# Patient Record
Sex: Female | Born: 1950 | Race: White | Hispanic: No | Marital: Married | Smoking: Never smoker
Health system: Southern US, Community
[De-identification: ages and names within clinical notes are randomized; demographics above are authoritative.]

## PROBLEM LIST (undated history)

## (undated) DIAGNOSIS — E782 Mixed hyperlipidemia: Secondary | ICD-10-CM

## (undated) DIAGNOSIS — M25559 Pain in unspecified hip: Secondary | ICD-10-CM

## (undated) DIAGNOSIS — K589 Irritable bowel syndrome without diarrhea: Secondary | ICD-10-CM

## (undated) DIAGNOSIS — H269 Unspecified cataract: Secondary | ICD-10-CM

## (undated) DIAGNOSIS — J300 Vasomotor rhinitis: Secondary | ICD-10-CM

## (undated) DIAGNOSIS — R011 Cardiac murmur, unspecified: Secondary | ICD-10-CM

## (undated) DIAGNOSIS — Z8619 Personal history of other infectious and parasitic diseases: Secondary | ICD-10-CM

## (undated) DIAGNOSIS — M7580 Other shoulder lesions, unspecified shoulder: Secondary | ICD-10-CM

## (undated) DIAGNOSIS — F419 Anxiety disorder, unspecified: Secondary | ICD-10-CM

## (undated) DIAGNOSIS — R030 Elevated blood-pressure reading, without diagnosis of hypertension: Secondary | ICD-10-CM

## (undated) DIAGNOSIS — Z860100 Personal history of colon polyps, unspecified: Secondary | ICD-10-CM

## (undated) DIAGNOSIS — N390 Urinary tract infection, site not specified: Secondary | ICD-10-CM

## (undated) DIAGNOSIS — T7840XA Allergy, unspecified, initial encounter: Secondary | ICD-10-CM

## (undated) DIAGNOSIS — S0300XA Dislocation of jaw, unspecified side, initial encounter: Secondary | ICD-10-CM

## (undated) DIAGNOSIS — F329 Major depressive disorder, single episode, unspecified: Secondary | ICD-10-CM

## (undated) DIAGNOSIS — N3090 Cystitis, unspecified without hematuria: Secondary | ICD-10-CM

## (undated) DIAGNOSIS — K449 Diaphragmatic hernia without obstruction or gangrene: Secondary | ICD-10-CM

## (undated) DIAGNOSIS — G4730 Sleep apnea, unspecified: Secondary | ICD-10-CM

## (undated) DIAGNOSIS — Z9289 Personal history of other medical treatment: Secondary | ICD-10-CM

## (undated) DIAGNOSIS — M797 Fibromyalgia: Secondary | ICD-10-CM

## (undated) DIAGNOSIS — H04123 Dry eye syndrome of bilateral lacrimal glands: Secondary | ICD-10-CM

## (undated) DIAGNOSIS — M5481 Occipital neuralgia: Secondary | ICD-10-CM

## (undated) DIAGNOSIS — E041 Nontoxic single thyroid nodule: Secondary | ICD-10-CM

## (undated) DIAGNOSIS — F32A Depression, unspecified: Secondary | ICD-10-CM

## (undated) DIAGNOSIS — Z87442 Personal history of urinary calculi: Secondary | ICD-10-CM

## (undated) DIAGNOSIS — H43399 Other vitreous opacities, unspecified eye: Secondary | ICD-10-CM

## (undated) HISTORY — PX: LEFT OOPHORECTOMY: SHX1961

## (undated) HISTORY — DX: Cardiac murmur, unspecified: R01.1

## (undated) HISTORY — DX: Unspecified cataract: H26.9

## (undated) HISTORY — DX: Occipital neuralgia: M54.81

## (undated) HISTORY — DX: Dislocation of jaw, unspecified side, initial encounter: S03.00XA

## (undated) HISTORY — DX: Irritable bowel syndrome, unspecified: K58.9

## (undated) HISTORY — DX: Personal history of other medical treatment: Z92.89

## (undated) HISTORY — PX: OTHER SURGICAL HISTORY: SHX169

## (undated) HISTORY — PX: LAPAROSCOPY: SHX197

## (undated) HISTORY — DX: Allergy, unspecified, initial encounter: T78.40XA

## (undated) HISTORY — DX: Major depressive disorder, single episode, unspecified: F32.9

## (undated) HISTORY — DX: Mixed hyperlipidemia: E78.2

## (undated) HISTORY — DX: Personal history of colon polyps, unspecified: Z86.0100

## (undated) HISTORY — DX: Cystitis, unspecified without hematuria: N30.90

## (undated) HISTORY — DX: Anxiety disorder, unspecified: F41.9

## (undated) HISTORY — PX: POLYPECTOMY: SHX149

## (undated) HISTORY — DX: Personal history of colonic polyps: Z86.010

## (undated) HISTORY — DX: Depression, unspecified: F32.A

## (undated) HISTORY — DX: Elevated blood-pressure reading, without diagnosis of hypertension: R03.0

## (undated) HISTORY — DX: Fibromyalgia: M79.7

## (undated) HISTORY — DX: Vasomotor rhinitis: J30.0

## (undated) HISTORY — DX: Personal history of other infectious and parasitic diseases: Z86.19

## (undated) HISTORY — DX: Dry eye syndrome of bilateral lacrimal glands: H04.123

## (undated) HISTORY — PX: UPPER GASTROINTESTINAL ENDOSCOPY: SHX188

## (undated) HISTORY — PX: UPPER GI ENDOSCOPY: SHX6162

---
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
## 2009-12-26 HISTORY — PX: COLONOSCOPY: SHX174

## 2014-12-26 HISTORY — PX: COLONOSCOPY: SHX174

## 2015-02-23 ENCOUNTER — Other Ambulatory Visit: Payer: Self-pay | Admitting: Physician Assistant

## 2015-02-23 DIAGNOSIS — M8580 Other specified disorders of bone density and structure, unspecified site: Secondary | ICD-10-CM

## 2015-02-23 DIAGNOSIS — Z1231 Encounter for screening mammogram for malignant neoplasm of breast: Secondary | ICD-10-CM

## 2015-03-05 ENCOUNTER — Other Ambulatory Visit: Payer: Self-pay | Admitting: Physician Assistant

## 2015-03-05 DIAGNOSIS — Z1231 Encounter for screening mammogram for malignant neoplasm of breast: Secondary | ICD-10-CM

## 2015-03-05 DIAGNOSIS — Z803 Family history of malignant neoplasm of breast: Secondary | ICD-10-CM

## 2015-04-06 ENCOUNTER — Ambulatory Visit
Admission: RE | Admit: 2015-04-06 | Discharge: 2015-04-06 | Disposition: A | Discharge status: Home / Self Care | Payer: No Typology Code available for payment source | Source: Ambulatory Visit | Attending: Physician Assistant | Admitting: Physician Assistant

## 2015-04-06 DIAGNOSIS — Z1231 Encounter for screening mammogram for malignant neoplasm of breast: Secondary | ICD-10-CM

## 2015-04-06 DIAGNOSIS — M8580 Other specified disorders of bone density and structure, unspecified site: Secondary | ICD-10-CM

## 2015-04-06 DIAGNOSIS — Z803 Family history of malignant neoplasm of breast: Secondary | ICD-10-CM

## 2015-04-07 ENCOUNTER — Other Ambulatory Visit: Payer: Self-pay | Admitting: Physician Assistant

## 2015-04-07 DIAGNOSIS — R928 Other abnormal and inconclusive findings on diagnostic imaging of breast: Secondary | ICD-10-CM

## 2015-04-10 ENCOUNTER — Other Ambulatory Visit: Payer: No Typology Code available for payment source

## 2015-04-17 ENCOUNTER — Ambulatory Visit
Admission: RE | Admit: 2015-04-17 | Discharge: 2015-04-17 | Disposition: A | Discharge status: Home / Self Care | Payer: No Typology Code available for payment source | Source: Ambulatory Visit | Attending: Physician Assistant | Admitting: Physician Assistant

## 2015-04-17 DIAGNOSIS — R928 Other abnormal and inconclusive findings on diagnostic imaging of breast: Secondary | ICD-10-CM

## 2016-07-05 ENCOUNTER — Other Ambulatory Visit: Payer: Self-pay | Admitting: Physician Assistant

## 2016-07-05 DIAGNOSIS — Z1231 Encounter for screening mammogram for malignant neoplasm of breast: Secondary | ICD-10-CM

## 2016-08-02 ENCOUNTER — Ambulatory Visit
Admission: RE | Admit: 2016-08-02 | Discharge: 2016-08-02 | Disposition: A | Discharge status: Home / Self Care | Payer: BLUE CROSS/BLUE SHIELD | Source: Ambulatory Visit | Attending: Physician Assistant | Admitting: Physician Assistant

## 2016-08-02 ENCOUNTER — Other Ambulatory Visit: Payer: Self-pay | Admitting: *Deleted

## 2016-08-02 DIAGNOSIS — Z1231 Encounter for screening mammogram for malignant neoplasm of breast: Secondary | ICD-10-CM

## 2017-01-19 DIAGNOSIS — M791 Myalgia: Secondary | ICD-10-CM | POA: Diagnosis not present

## 2017-01-19 DIAGNOSIS — M9901 Segmental and somatic dysfunction of cervical region: Secondary | ICD-10-CM | POA: Diagnosis not present

## 2017-01-19 DIAGNOSIS — M9902 Segmental and somatic dysfunction of thoracic region: Secondary | ICD-10-CM | POA: Diagnosis not present

## 2017-01-21 DIAGNOSIS — M791 Myalgia: Secondary | ICD-10-CM | POA: Diagnosis not present

## 2017-01-21 DIAGNOSIS — M9902 Segmental and somatic dysfunction of thoracic region: Secondary | ICD-10-CM | POA: Diagnosis not present

## 2017-01-21 DIAGNOSIS — M9901 Segmental and somatic dysfunction of cervical region: Secondary | ICD-10-CM | POA: Diagnosis not present

## 2017-01-24 DIAGNOSIS — M9901 Segmental and somatic dysfunction of cervical region: Secondary | ICD-10-CM | POA: Diagnosis not present

## 2017-01-24 DIAGNOSIS — M791 Myalgia: Secondary | ICD-10-CM | POA: Diagnosis not present

## 2017-01-24 DIAGNOSIS — M9902 Segmental and somatic dysfunction of thoracic region: Secondary | ICD-10-CM | POA: Diagnosis not present

## 2017-01-27 DIAGNOSIS — M9901 Segmental and somatic dysfunction of cervical region: Secondary | ICD-10-CM | POA: Diagnosis not present

## 2017-01-27 DIAGNOSIS — M9902 Segmental and somatic dysfunction of thoracic region: Secondary | ICD-10-CM | POA: Diagnosis not present

## 2017-01-30 DIAGNOSIS — M9902 Segmental and somatic dysfunction of thoracic region: Secondary | ICD-10-CM | POA: Diagnosis not present

## 2017-01-30 DIAGNOSIS — M9901 Segmental and somatic dysfunction of cervical region: Secondary | ICD-10-CM | POA: Diagnosis not present

## 2017-01-31 DIAGNOSIS — H43812 Vitreous degeneration, left eye: Secondary | ICD-10-CM | POA: Diagnosis not present

## 2017-01-31 DIAGNOSIS — H43392 Other vitreous opacities, left eye: Secondary | ICD-10-CM | POA: Diagnosis not present

## 2017-01-31 DIAGNOSIS — H5319 Other subjective visual disturbances: Secondary | ICD-10-CM | POA: Diagnosis not present

## 2017-02-03 DIAGNOSIS — M9901 Segmental and somatic dysfunction of cervical region: Secondary | ICD-10-CM | POA: Diagnosis not present

## 2017-02-03 DIAGNOSIS — M9902 Segmental and somatic dysfunction of thoracic region: Secondary | ICD-10-CM | POA: Diagnosis not present

## 2017-02-09 DIAGNOSIS — M9901 Segmental and somatic dysfunction of cervical region: Secondary | ICD-10-CM | POA: Diagnosis not present

## 2017-02-09 DIAGNOSIS — M9902 Segmental and somatic dysfunction of thoracic region: Secondary | ICD-10-CM | POA: Diagnosis not present

## 2017-02-15 DIAGNOSIS — M9902 Segmental and somatic dysfunction of thoracic region: Secondary | ICD-10-CM | POA: Diagnosis not present

## 2017-02-15 DIAGNOSIS — M9901 Segmental and somatic dysfunction of cervical region: Secondary | ICD-10-CM | POA: Diagnosis not present

## 2017-02-28 DIAGNOSIS — M9901 Segmental and somatic dysfunction of cervical region: Secondary | ICD-10-CM | POA: Diagnosis not present

## 2017-02-28 DIAGNOSIS — M9902 Segmental and somatic dysfunction of thoracic region: Secondary | ICD-10-CM | POA: Diagnosis not present

## 2017-03-07 DIAGNOSIS — M9901 Segmental and somatic dysfunction of cervical region: Secondary | ICD-10-CM | POA: Diagnosis not present

## 2017-03-07 DIAGNOSIS — M9902 Segmental and somatic dysfunction of thoracic region: Secondary | ICD-10-CM | POA: Diagnosis not present

## 2017-03-13 DIAGNOSIS — H43812 Vitreous degeneration, left eye: Secondary | ICD-10-CM | POA: Diagnosis not present

## 2017-03-13 DIAGNOSIS — H5319 Other subjective visual disturbances: Secondary | ICD-10-CM | POA: Diagnosis not present

## 2017-03-13 DIAGNOSIS — H43392 Other vitreous opacities, left eye: Secondary | ICD-10-CM | POA: Diagnosis not present

## 2017-03-20 DIAGNOSIS — M9902 Segmental and somatic dysfunction of thoracic region: Secondary | ICD-10-CM | POA: Diagnosis not present

## 2017-03-20 DIAGNOSIS — M9901 Segmental and somatic dysfunction of cervical region: Secondary | ICD-10-CM | POA: Diagnosis not present

## 2017-03-29 DIAGNOSIS — H538 Other visual disturbances: Secondary | ICD-10-CM | POA: Diagnosis not present

## 2017-03-29 DIAGNOSIS — H04123 Dry eye syndrome of bilateral lacrimal glands: Secondary | ICD-10-CM | POA: Diagnosis not present

## 2017-04-03 DIAGNOSIS — M9902 Segmental and somatic dysfunction of thoracic region: Secondary | ICD-10-CM | POA: Diagnosis not present

## 2017-04-03 DIAGNOSIS — M9901 Segmental and somatic dysfunction of cervical region: Secondary | ICD-10-CM | POA: Diagnosis not present

## 2017-04-04 ENCOUNTER — Encounter: Payer: Self-pay | Admitting: Physician Assistant

## 2017-04-04 ENCOUNTER — Ambulatory Visit (INDEPENDENT_AMBULATORY_CARE_PROVIDER_SITE_OTHER): Payer: PPO | Admitting: Physician Assistant

## 2017-04-04 VITALS — BP 150/90 | HR 110 | Temp 98.8°F | Ht 62.5 in | Wt 164.0 lb

## 2017-04-04 DIAGNOSIS — J0100 Acute maxillary sinusitis, unspecified: Secondary | ICD-10-CM

## 2017-04-04 MED ORDER — DOXYCYCLINE HYCLATE 100 MG PO TABS: 100.0000 mg | ORAL_TABLET | Freq: Two times a day (BID) | ORAL | 0 refills | Status: DC

## 2017-04-04 NOTE — Progress Notes (Signed)
Natalie Erickson is a 66 y.o. female here for sinus pressure above eyes, facial pain,runny nose, sneezing, right ear pain, sore throat  I acted as a Education administrator for [employer], PA-C Anselmo Pickler, LPN  History of Present Illness:   Chief Complaint  Patient presents with  . Right ear pain    week ago  . Nasal Congestion  . sneezing  . Sore Throat  . Facial Pain  . sinus pressure above eyes    HPI   For the past 3 weeks, she has had ear pain, nasal congestions, sneezing, sore throat, facial pain and sinus pressure. On and off blurry vision. Went to the eye doctor last Wednesday, said she had dry eyes and needs corrective lenses, has been using re-wetting drops, OTC eye glasses. She has also been using Nasonex but this caused some nasal irritation. She is using zyrtec, started yesterday. She has used some nasal saline and Neti Pot. Appetite is variable due to anxiety. No chest discomfort, no SOB, no swelling in legs. No n/v/d. Checks her blood pressure often at home, today was 145/80 at home. Hx of white coat HTN. Limited exercise secondary to her URI symptoms. Hx of white coat hypertension.  PMHx, SurgHx, SocialHx, Medications, and Allergies were reviewed in the Visit Navigator and updated as appropriate.  Current Medications:   Current Outpatient Prescriptions:  .  Carboxymethylcellulose Sodium (THERATEARS) 0.25 % SOLN, Apply 1 each to eye as needed., Disp: , Rfl:  .  cetirizine (ZYRTEC) 1 MG/ML syrup, Take 2.5 mg by mouth 2 (two) times daily., Disp: , Rfl:  .  NON FORMULARY, Take 1 each by mouth as needed. Breath Easy Tea, Disp: , Rfl:  .  Cholecalciferol (VITAMIN D) 2000 units tablet, Take by mouth., Disp: , Rfl:  .  doxycycline (VIBRA-TABS) 100 MG tablet, Take 1 tablet (100 mg total) by mouth 2 (two) times daily., Disp: 20 tablet, Rfl: 0   Review of Systems:   Review of Systems  Constitutional: Positive for fever and malaise/fatigue.  HENT: Positive for congestion, ear pain, sinus  pain, sore throat and tinnitus.   Eyes: Positive for blurred vision.  Respiratory: Positive for cough.   Cardiovascular: Negative.   Gastrointestinal: Negative.   Genitourinary: Negative.   Musculoskeletal: Positive for myalgias.       Fibromylagia  Skin: Negative.   Neurological: Negative.   Endo/Heme/Allergies: Positive for environmental allergies.  Psychiatric/Behavioral: Negative.     Vitals:   Vitals:   04/04/17 1334  BP: (!) 150/90  Pulse: (!) 110  Temp: 98.8 F (37.1 C)  TempSrc: Oral  SpO2: 98%  Weight: 164 lb (74.4 kg)  Height: 5' 2.5" (1.588 m)     Body mass index is 29.52 kg/m.  Physical Exam:   Physical Exam  Constitutional: She appears well-developed. She is cooperative.  Non-toxic appearance. She does not have a sickly appearance. She does not appear ill. No distress.  HENT:  Head: Normocephalic and atraumatic.  Right Ear: Tympanic membrane, external ear and ear canal normal. Tympanic membrane is not erythematous, not retracted and not bulging.  Left Ear: Tympanic membrane, external ear and ear canal normal. Tympanic membrane is not erythematous, not retracted and not bulging.  Nose: Right sinus exhibits maxillary sinus tenderness and frontal sinus tenderness. Left sinus exhibits maxillary sinus tenderness and frontal sinus tenderness.  Mouth/Throat: Uvula is midline. Posterior oropharyngeal erythema present. No oropharyngeal exudate or posterior oropharyngeal edema.  Eyes: Conjunctivae and lids are normal.  Neck: Trachea normal.  Cardiovascular:  Normal rate, regular rhythm, S1 normal, S2 normal and normal heart sounds.   Pulmonary/Chest: Effort normal and breath sounds normal. She has no decreased breath sounds. She has no wheezes. She has no rhonchi. She has no rales.  Lymphadenopathy:    She has no cervical adenopathy.  Neurological: She is alert.  Skin: Skin is warm, dry and intact.  Psychiatric: She has a normal mood and affect. Her speech is normal  and behavior is normal.  Nursing note and vitals reviewed.     Assessment and Plan:    Natalie Erickson was seen today for left ear pain, nasal congestion, sneezing, sore throat, facial pain and sinus pressure above eyes.  Diagnoses and all orders for this visit:  Acute non-recurrent maxillary sinusitis  Other orders -     doxycycline (VIBRA-TABS) 100 MG tablet; Take 1 tablet (100 mg total) by mouth 2 (two) times daily.   She is PCN allergic, will order Doxycycline per orders. Continue nasal saline and zyrtec. Push fluids and get plenty of rest. She has an appointment to establish care in two days, follow-up with PCP at that time regarding symptoms. We briefly discussed doing a short burst of steroids however she would like to wait until PCP evaluates her and add that on if no improvement. Follow-up sooner if she develops any symptoms such as inability to open/close mouth, worsening fever, SOB. Patient is agreeable to plan.  . Reviewed expectations re: course of current medical issues. . Discussed self-management of symptoms. . Outlined signs and symptoms indicating need for more acute intervention. . Patient verbalized understanding and all questions were answered. . See orders for this visit as documented in the electronic medical record. . Patient received an After-Visit Summary.  CMA or LPN served as scribe during this visit. History, Physical, and Plan performed by medical provider. Documentation and orders reviewed and attested to.  Inda Coke, PA-C

## 2017-04-04 NOTE — Progress Notes (Signed)
Pre visit review using our clinic review tool, if applicable. No additional management support is needed unless otherwise documented below in the visit note. 

## 2017-04-04 NOTE — Patient Instructions (Signed)
Stay hydrated. Take the antibiotic as directed, take with food to prevent stomach upset.  You have a sinus infection. Push fluids and plenty of rest. Nasal saline irrigation or neti pot to help drain sinuses. May use plain mucinex with plenty of fluid to help mobilize mucous. Please let us know if fever >101.5, trouble opening/closing mouth, difficulty swallowing, or worsening instead of improving as expected.   Sinusitis, Adult Sinusitis is soreness and inflammation of your sinuses. Sinuses are hollow spaces in the bones around your face. Your sinuses are located:  Around your eyes.  In the middle of your forehead.  Behind your nose.  In your cheekbones. Your sinuses and nasal passages are lined with a stringy fluid (mucus). Mucus normally drains out of your sinuses. When your nasal tissues become inflamed or swollen, the mucus can become trapped or blocked so air cannot flow through your sinuses. This allows bacteria, viruses, and funguses to grow, which leads to infection. Sinusitis can develop quickly and last for 7?10 days (acute) or for more than 12 weeks (chronic). Sinusitis often develops after a cold. What are the causes? This condition is caused by anything that creates swelling in the sinuses or stops mucus from draining, including:  Allergies.  Asthma.  Bacterial or viral infection.  Abnormally shaped bones between the nasal passages.  Nasal growths that contain mucus (nasal polyps).  Narrow sinus openings.  Pollutants, such as chemicals or irritants in the air.  A foreign object stuck in the nose.  A fungal infection. This is rare. What increases the risk? The following factors may make you more likely to develop this condition:  Having allergies or asthma.  Having had a recent cold or respiratory tract infection.  Having structural deformities or blockages in your nose or sinuses.  Having a weak immune system.  Doing a lot of swimming or  diving.  Overusing nasal sprays.  Smoking. What are the signs or symptoms? The main symptoms of this condition are pain and a feeling of pressure around the affected sinuses. Other symptoms include:  Upper toothache.  Earache.  Headache.  Bad breath.  Decreased sense of smell and taste.  A cough that may get worse at night.  Fatigue.  Fever.  Thick drainage from your nose. The drainage is often green and it may contain pus (purulent).  Stuffy nose or congestion.  Postnasal drip. This is when extra mucus collects in the throat or back of the nose.  Swelling and warmth over the affected sinuses.  Sore throat.  Sensitivity to light. How is this diagnosed? This condition is diagnosed based on symptoms, a medical history, and a physical exam. To find out if your condition is acute or chronic, your health care provider may:  Look in your nose for signs of nasal polyps.  Tap over the affected sinus to check for signs of infection.  View the inside of your sinuses using an imaging device that has a light attached (endoscope). If your health care provider suspects that you have chronic sinusitis, you may also:  Be tested for allergies.  Have a sample of mucus taken from your nose (nasal culture) and checked for bacteria.  Have a mucus sample examined to see if your sinusitis is related to an allergy. If your sinusitis does not respond to treatment and it lasts longer than 8 weeks, you may have an MRI or CT scan to check your sinuses. These scans also help to determine how severe your infection is. In rare  cases, a bone biopsy may be done to rule out more serious types of fungal sinus disease. How is this treated? Treatment for sinusitis depends on the cause and whether your condition is chronic or acute. If a virus is causing your sinusitis, your symptoms will go away on their own within 10 days. You may be given medicines to relieve your symptoms, including:  Topical  nasal decongestants. They shrink swollen nasal passages and let mucus drain from your sinuses.  Antihistamines. These drugs block inflammation that is triggered by allergies. This can help to ease swelling in your nose and sinuses.  Topical nasal corticosteroids. These are nasal sprays that ease inflammation and swelling in your nose and sinuses.  Nasal saline washes. These rinses can help to get rid of thick mucus in your nose. If your condition is caused by bacteria, you will be given an antibiotic medicine. If your condition is caused by a fungus, you will be given an antifungal medicine. Surgery may be needed to correct underlying conditions, such as narrow nasal passages. Surgery may also be needed to remove polyps. Follow these instructions at home: Medicines   Take, use, or apply over-the-counter and prescription medicines only as told by your health care provider. These may include nasal sprays.  If you were prescribed an antibiotic medicine, take it as told by your health care provider. Do not stop taking the antibiotic even if you start to feel better. Hydrate and Humidify   Drink enough water to keep your urine clear or pale yellow. Staying hydrated will help to thin your mucus.  Use a cool mist humidifier to keep the humidity level in your home above 50%.  Inhale steam for 10-15 minutes, 3-4 times a day or as told by your health care provider. You can do this in the bathroom while a hot shower is running.  Limit your exposure to cool or dry air. Rest   Rest as much as possible.  Sleep with your head raised (elevated).  Make sure to get enough sleep each night. General instructions   Apply a warm, moist washcloth to your face 3-4 times a day or as told by your health care provider. This will help with discomfort.  Wash your hands often with soap and water to reduce your exposure to viruses and other germs. If soap and water are not available, use hand sanitizer.  Do  not smoke. Avoid being around people who are smoking (secondhand smoke).  Keep all follow-up visits as told by your health care provider. This is important. Contact a health care provider if:  You have a fever.  Your symptoms get worse.  Your symptoms do not improve within 10 days. Get help right away if:  You have a severe headache.  You have persistent vomiting.  You have pain or swelling around your face or eyes.  You have vision problems.  You develop confusion.  Your neck is stiff.  You have trouble breathing. This information is not intended to replace advice given to you by your health care provider. Make sure you discuss any questions you have with your health care provider. Document Released: 12/12/2005 Document Revised: 08/07/2016 Document Reviewed: 10/07/2015 Elsevier Interactive Patient Education  2017 [employer].

## 2017-04-05 ENCOUNTER — Ambulatory Visit: Payer: PPO | Admitting: Family Medicine

## 2017-04-06 ENCOUNTER — Encounter: Payer: Self-pay | Admitting: Family Medicine

## 2017-04-06 ENCOUNTER — Ambulatory Visit (INDEPENDENT_AMBULATORY_CARE_PROVIDER_SITE_OTHER): Payer: PPO | Admitting: Family Medicine

## 2017-04-06 VITALS — BP 158/86 | HR 86 | Temp 98.2°F | Ht 62.5 in | Wt 163.4 lb

## 2017-04-06 DIAGNOSIS — F419 Anxiety disorder, unspecified: Secondary | ICD-10-CM

## 2017-04-06 DIAGNOSIS — E782 Mixed hyperlipidemia: Secondary | ICD-10-CM

## 2017-04-06 DIAGNOSIS — H04123 Dry eye syndrome of bilateral lacrimal glands: Secondary | ICD-10-CM

## 2017-04-06 DIAGNOSIS — R030 Elevated blood-pressure reading, without diagnosis of hypertension: Secondary | ICD-10-CM | POA: Diagnosis not present

## 2017-04-06 HISTORY — DX: Anxiety disorder, unspecified: F41.9

## 2017-04-06 HISTORY — DX: Mixed hyperlipidemia: E78.2

## 2017-04-06 HISTORY — DX: Elevated blood-pressure reading, without diagnosis of hypertension: R03.0

## 2017-04-06 HISTORY — DX: Dry eye syndrome of bilateral lacrimal glands: H04.123

## 2017-04-06 NOTE — Progress Notes (Signed)
Pre visit review using our clinic review tool, if applicable. No additional management support is needed unless otherwise documented below in the visit note. 

## 2017-04-06 NOTE — Progress Notes (Signed)
Natalie Erickson is a 66 y.o. female is here to [employer].   History of Present Illness:   [name] CMA acting as scribe for Dr. Juleen China   HPI Patient is here today to establish care. She is concerned today about her blood pressure being elevated.   1. Elevated BP without the diagnosis of HTN.  Home blood pressure readings at goal. Avoiding excessive salt intake. Trying to exercise on a regular basis. Denies chest pain.   Wt Readings from Last 3 Encounters:  04/06/17 163 lb 6.4 oz (74.1 kg)  04/04/17 164 lb (74.4 kg)   BP Readings from Last 3 Encounters:  04/06/17 (!) 158/86  04/04/17 (!) 150/90    2. Mixed hyperlipidemia. Hx of the same. Intolerant of statins. Due for LIPID panel.   3. Dry eyes. Chronic but improving.    4. Anxiety. Chronic. On no medications. Uses a meditation CD. No SI/HI.    Health Maintenance Due  Topic Date Due  . Hepatitis C Screening  January 13, 1951  . HIV Screening  11/17/1966  . TETANUS/TDAP  11/17/1970  . PAP SMEAR  11/17/1972  . COLONOSCOPY  11/17/2001  . PNA vac Low Risk Adult (1 of 2 - PCV13) 11/17/2016   PMHx, SurgHx, SocialHx, Medications, and Allergies were reviewed in the Visit Navigator and updated as appropriate.   Past Medical History:  Diagnosis Date  . Anxiety 04/06/2017  . Dry eyes 04/06/2017  . Elevated BP without diagnosis of hypertension 04/06/2017  . Mixed hyperlipidemia 04/06/2017   No past surgical history on file. No family history on file.  Social History  Substance Use Topics  . Smoking status: Never Smoker  . Smokeless tobacco: Never Used  . Alcohol use No   Current Medications and Allergies:   Current Outpatient Prescriptions:  .  Carboxymethylcellulose Sodium (THERATEARS) 0.25 % SOLN, Apply 1 each to eye as needed., Disp: , Rfl:  .  cetirizine (ZYRTEC) 1 MG/ML syrup, Take 2.5 mg by mouth 2 (two) times daily., Disp: , Rfl:  .  Cholecalciferol (VITAMIN D) 2000 units tablet, Take by mouth., Disp: , Rfl:  .   doxycycline (VIBRA-TABS) 100 MG tablet, Take 1 tablet (100 mg total) by mouth 2 (two) times daily., Disp: 20 tablet, Rfl: 0 .  NON FORMULARY, Take 1 each by mouth as needed. Breath Easy Tea, Disp: , Rfl:   Review of Systems:   Review of Systems  Constitutional: Positive for malaise/fatigue. Negative for chills and fever.  HENT: Positive for sinus pain. Negative for ear pain, nosebleeds and sore throat.   Eyes: Negative for double vision.  Respiratory: Negative for cough, shortness of breath and wheezing.   Cardiovascular: Negative for chest pain, palpitations and leg swelling.  Gastrointestinal: Negative for constipation, diarrhea and vomiting.  Genitourinary: Negative for dysuria.  Musculoskeletal: Negative for back pain, joint pain and neck pain.  Neurological: Negative for dizziness and headaches.  Psychiatric/Behavioral: Negative for depression, hallucinations and memory loss.    Vitals:   Vitals:   04/06/17 0832  BP: (!) 158/86  Pulse: 86  Temp: 98.2 F (36.8 C)  TempSrc: Oral  SpO2: 99%  Weight: 163 lb 6.4 oz (74.1 kg)  Height: 5' 2.5" (1.588 m)     Body mass index is 29.41 kg/m.  Physical Exam:   Physical Exam  Constitutional: She appears well-developed and well-nourished. No distress.  HENT:  Head: Normocephalic and atraumatic.  Eyes: EOM are normal. Pupils are equal, round, and reactive to light.  Neck: Normal  range of motion. Neck supple.  Cardiovascular: Normal rate, regular rhythm, normal heart sounds and intact distal pulses.   Pulmonary/Chest: Effort normal.  Abdominal: Soft.  Neurological: She is alert.  Skin: Skin is warm.  Psychiatric: She has a normal mood and affect. Her behavior is normal.  Nursing note and vitals reviewed.   Assessment and Plan:    Natalie Erickson was seen today for establish care.  Diagnoses and all orders for this visit:  Elevated BP without diagnosis of hypertension Comments: Home BP log provided - generally at goal. Will  wait on lipid panel to decide if we should be more aggressive.  Orders: -     Comprehensive metabolic panel; Future -     TSH; Future  Mixed hyperlipidemia -     Lipid panel; Future  Dry eyes  Anxiety Comments: Rec. Magnesium (CALM brand). Orders: -     TSH; Future   . Reviewed expectations re: course of current medical issues. . Discussed self-management of symptoms. . Outlined signs and symptoms indicating need for more acute intervention. . Patient verbalized understanding and all questions were answered. . See orders for this visit as documented in the electronic medical record. . Patient received an After Visit Summary.  Records requested if needed. I spent 30 minutes with this patient, greater than 50% was face-to-face time counseling regarding the above diagnoses.  CMA served as Education administrator during this visit. History, Physical, and Plan performed by medical provider. Documentation and orders reviewed and attested to. Briscoe Deutscher, D.O.  Briscoe Deutscher, [place], [place] 04/06/2017   Follow-up: No Follow-up on file.  No orders of the defined types were placed in this encounter.  There are no discontinued medications. Orders Placed This Encounter  Procedures  . Comprehensive metabolic panel  . TSH  . Lipid panel

## 2017-04-11 ENCOUNTER — Encounter: Payer: Self-pay | Admitting: Physician Assistant

## 2017-04-11 ENCOUNTER — Ambulatory Visit (INDEPENDENT_AMBULATORY_CARE_PROVIDER_SITE_OTHER): Payer: PPO | Admitting: Physician Assistant

## 2017-04-11 ENCOUNTER — Telehealth: Payer: Self-pay | Admitting: Family Medicine

## 2017-04-11 VITALS — BP 130/78 | HR 87 | Temp 98.6°F | Ht 62.5 in | Wt 162.8 lb

## 2017-04-11 DIAGNOSIS — J301 Allergic rhinitis due to pollen: Secondary | ICD-10-CM | POA: Diagnosis not present

## 2017-04-11 DIAGNOSIS — R0982 Postnasal drip: Secondary | ICD-10-CM

## 2017-04-11 NOTE — Patient Instructions (Signed)
It was great seeing you today!  Please follow-up with [place] if you develop any symptoms or other concerns.  Stay hydrated.  Stop the doxy.  If you develop any shortness of breath or difficulty breathing, please go to the ER!

## 2017-04-11 NOTE — Telephone Encounter (Signed)
Patient called in with reactions to doxycycline. Sent to [facility].

## 2017-04-11 NOTE — Telephone Encounter (Signed)
[facility] Patient Name: Loura Diperna DOB: 1951-08-08 Initial Comment Caller states c/o throat feels swollen and nasal drainage. She thinks she is having an allergic reaction to Doxycycline. Nurse Assessment Nurse: Markus Daft, RN, Sherre Poot Date/Time (Eastern Time): 04/11/2017 8:49:51 AM Confirm and document reason for call. If symptomatic, describe symptoms. ---Caller states c/o throat feels swollen and nasal drainage. S/S started yesterday and got worse thru the night. She thinks she is having an allergic reaction to Doxycycline BID that was started a week ago. She hasn't had any this AM (has enough for 3 more days). Little sore throat, and not as swollen. She can swallow. Denies SOB. - She was having sinus pain and allergy like s/s that required antibiotic. Face is feeling better now. Does the patient have any new or worsening symptoms? ---Yes Will a triage be completed? ---Yes Related visit to physician within the last 2 weeks? ---Yes Does the PT have any chronic conditions? (i.e. diabetes, asthma, etc.) ---No Is this a behavioral health or substance abuse call? ---No Guidelines Guideline Title Affirmed Question Affirmed Notes Sore Throat [1] Sore throat with cough/cold symptoms AND [2] present > 5 days Final Disposition User See PCP When Office is Open (within 3 days) Markus Daft, Therapist, sports, Sherre Poot Comments She has had "regular" doxycycline before and not had an issue. This one is Doxycycline Hyclate 100 mg BID. RN advised that this s/s is rare but it is possible. Caller verb. understanding. [email or web address].com.br doxycycline-oral/details Still has a runny nose, and itchy eyes. She has been using nose spray with prednisone in it, and then tried children's zyrtec, and so then she tried OTC Hyland's allergy medicine. And using breath easy tea. She hasn't  had any of these since yesterday. -->Pt did not want to set up appt yet. She wanted to hear back from MD and what she thought. Disagree/Comply: Comply

## 2017-04-11 NOTE — Progress Notes (Signed)
Natalie Erickson is a 66 y.o. female here for a follow up of a pre-existing problem.  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   History of Present Illness:   Chief Complaint  Patient presents with  . Acute Visit  . Sore Throat    HPI  Has taken Doxycycline for 7 days. Stopped taking this morning because she felt as though her "hard palate was swelling" and has had improvement of her symptoms since stopping her medication. Sinus pressure and blurry vision have resolved. No issues with n/v/d/c. Taking [employer]'s 4Kids Complete Allergy instead of Zyrtec Children's, feels like this is more effective than the Zyrtec. Has never tried regular Zyrtec, Allegra, Claritin. Is also taking Ricola. She denies difficulty breathing, shortness of breath, feeling like her throat is closing. Good appetite.  PMHx, SurgHx, SocialHx, Medications, and Allergies were reviewed in the Visit Navigator and updated as appropriate.  Current Medications:   Current Outpatient Prescriptions:  .  Carboxymethylcellulose Sodium (THERATEARS) 0.25 % SOLN, Apply 1 each to eye as needed., Disp: , Rfl:  .  cetirizine (ZYRTEC) 1 MG/ML syrup, Take 2.5 mg by mouth 2 (two) times daily., Disp: , Rfl:  .  Cholecalciferol (VITAMIN D) 2000 units tablet, Take by mouth., Disp: , Rfl:  .  doxycycline (VIBRA-TABS) 100 MG tablet, Take 1 tablet (100 mg total) by mouth 2 (two) times daily., Disp: 20 tablet, Rfl: 0 .  NON FORMULARY, Take 1 each by mouth as needed. Breath Easy Tea, Disp: , Rfl:    Review of Systems:   Review of Systems  Constitutional: Negative for chills, fever and malaise/fatigue.  HENT: Positive for sore throat.   Eyes: Negative for blurred vision.  Respiratory: Positive for cough (dry).   Cardiovascular: Negative for palpitations and PND.  Gastrointestinal: Negative for constipation, diarrhea, heartburn, nausea and vomiting.  Genitourinary: Negative for dysuria and urgency.  Skin: Negative for itching  and rash.  Neurological: Negative for dizziness and headaches.  Psychiatric/Behavioral: Negative for depression. The patient is not nervous/anxious.       Vitals:   Vitals:   04/11/17 1516  BP: 130/78  Pulse: 87  Temp: 98.6 F (37 C)  TempSrc: Oral  SpO2: 97%  Weight: 162 lb 12.8 oz (73.8 kg)  Height: 5' 2.5" (1.588 m)     Body mass index is 29.3 kg/m.  Physical Exam:   Physical Exam  Constitutional: She appears well-developed. She is cooperative.  Non-toxic appearance. She does not have a sickly appearance. She does not appear ill. No distress.  HENT:  Head: Normocephalic and atraumatic.  Right Ear: Tympanic membrane, external ear and ear canal normal. Tympanic membrane is not erythematous, not retracted and not bulging.  Left Ear: Tympanic membrane, external ear and ear canal normal. Tympanic membrane is not erythematous, not retracted and not bulging.  Nose: Nose normal. Right sinus exhibits no maxillary sinus tenderness and no frontal sinus tenderness. Left sinus exhibits no maxillary sinus tenderness and no frontal sinus tenderness.  Mouth/Throat: Uvula is midline. Posterior oropharyngeal erythema present. No posterior oropharyngeal edema.  Eyes: Conjunctivae and lids are normal.  Neck: Trachea normal.  Cardiovascular: Normal rate, regular rhythm, S1 normal, S2 normal and normal heart sounds.   Pulmonary/Chest: Effort normal and breath sounds normal. She has no decreased breath sounds. She has no wheezes. She has no rhonchi. She has no rales.  Lymphadenopathy:    She has no cervical adenopathy.  Neurological: She is alert.  Skin: Skin is warm, dry  and intact.  Psychiatric: She has a normal mood and affect. Her speech is normal and behavior is normal.  Nursing note and vitals reviewed.    Assessment and Plan:    Natalie Erickson was seen today for acute visit and sore throat.  Diagnoses and all orders for this visit:  Seasonal allergic rhinitis due to pollen and  post-nasal drip Sinus infection has resolved. Stop doxycycline. She is very reluctant to try new medications, including steroid-based nasal sprays and antihistamines. We discussed adding in a low-dose burst of steroids to help with inflammation of her throat however she is going to think about this and let us know if this is something that she would want in the future, as she is very sensitive to medications. I encouraged plenty of rest, fluids including water and hot tea with honey. Patient advised to seek medical attention if she develops any difficulty breathing or issues with her airway. Patient is agreeable to plan.   . Reviewed expectations re: course of current medical issues. . Discussed self-management of symptoms. . Outlined signs and symptoms indicating need for more acute intervention. . Patient verbalized understanding and all questions were answered. . See orders for this visit as documented in the electronic medical record. . Patient received an After-Visit Summary.  CMA or LPN served as scribe during this visit. History, Physical, and Plan performed by medical provider. Documentation and orders reviewed and attested to.  Inda Coke, PA-C

## 2017-04-11 NOTE — Telephone Encounter (Signed)
Please call patient to check in. No red flags according to RN triage note. She may come in for appointment for check in if concerning symptoms - at last for vitals.

## 2017-04-11 NOTE — Telephone Encounter (Signed)
Spoke with patient and she is gong to come in to be seen. She is not having any trouble breathing. She stated that the throat is starting to feel better. I advised that if she starts having trouble breathing before her visit that she would need to go to the ER

## 2017-04-11 NOTE — Progress Notes (Signed)
Pre visit review using our clinic review tool, if applicable. No additional management support is needed unless otherwise documented below in the visit note. 

## 2017-04-17 ENCOUNTER — Encounter: Payer: Self-pay | Admitting: Family Medicine

## 2017-04-17 ENCOUNTER — Ambulatory Visit (INDEPENDENT_AMBULATORY_CARE_PROVIDER_SITE_OTHER): Payer: PPO | Admitting: Family Medicine

## 2017-04-17 VITALS — BP 142/82 | HR 97 | Temp 98.2°F | Ht 60.0 in | Wt 161.0 lb

## 2017-04-17 DIAGNOSIS — R0982 Postnasal drip: Secondary | ICD-10-CM | POA: Diagnosis not present

## 2017-04-17 DIAGNOSIS — J301 Allergic rhinitis due to pollen: Secondary | ICD-10-CM | POA: Diagnosis not present

## 2017-04-17 MED ORDER — MONTELUKAST SODIUM 10 MG PO TABS: 10.0000 mg | ORAL_TABLET | Freq: Every day | ORAL | 3 refills | Status: DC

## 2017-04-17 MED ORDER — PREDNISONE 5 MG PO TABS: ORAL_TABLET | ORAL | 0 refills | Status: DC

## 2017-04-17 NOTE — Progress Notes (Signed)
Natalie Erickson is a 66 y.o. female here for a follow up of a pre-existing problem.  History of Present Illness:   Water quality scientist, CMA, acting as scribe for Dr. Juleen China.  Chief Complaint  Patient presents with  . Acute Visit  . Sore Throat  . Nasal Congestion  . Oral Swelling   Sore Throat   This is a recurrent problem. The current episode started 1 to 4 weeks ago. The problem has been unchanged. There has been no fever. The pain is mild. Associated symptoms include congestion. Pertinent negatives include no abdominal pain, coughing, diarrhea, drooling, ear discharge, ear pain, headaches, hoarse voice, plugged ear sensation, neck pain, shortness of breath, stridor, swollen glands, trouble swallowing or vomiting. She has had no exposure to strep or mono.   PMHx, SurgHx, SocialHx, Medications, and Allergies were reviewed in the Visit Navigator and updated as appropriate.  Current Medications:   .  Carboxymethylcellulose Sodium (THERATEARS) 0.25 % SOLN, Apply 1 each to eye as needed., Disp: , Rfl:  .  Cholecalciferol (VITAMIN D) 2000 units tablet, Take by mouth., Disp: , Rfl:  .  NON FORMULARY, Take 1 each by mouth as needed. Breath Easy Tea, Disp: , Rfl:    Review of Systems:   Review of Systems  Constitutional: Negative for chills and fever.  HENT: Positive for congestion and sore throat. Negative for drooling, ear discharge, ear pain, hoarse voice and trouble swallowing.   Eyes: Negative for blurred vision.  Respiratory: Negative for cough, sputum production, shortness of breath and stridor.   Cardiovascular: Negative for chest pain and palpitations.  Gastrointestinal: Negative for abdominal pain, diarrhea, nausea and vomiting.  Genitourinary: Negative for frequency.  Musculoskeletal: Negative for falls and neck pain.  Skin: Negative for rash.  Neurological: Negative for loss of consciousness and headaches.    Vitals:   Vitals:   04/17/17 1047  BP: (!) 142/82  Pulse: 97  Temp:  98.2 F (36.8 C)  TempSrc: Oral  SpO2: 99%  Weight: 161 lb (73 kg)  Height: 5' (1.524 m)     Body mass index is 31.44 kg/m.  Physical Exam:   Physical Exam  Constitutional: She appears well-developed and well-nourished.  HENT:  Head: Normocephalic and atraumatic.  Eyes: EOM are normal. Pupils are equal, round, and reactive to light.  Neck: Normal range of motion. Neck supple.  Cardiovascular: Normal rate, regular rhythm, normal heart sounds and intact distal pulses.   Pulmonary/Chest: Effort normal.  Abdominal: Soft.  Skin: Skin is warm.  Psychiatric: She has a normal mood and affect. Her behavior is normal.  Nursing note and vitals reviewed.   Assessment and Plan:    Lawsyn was seen today for acute visit, sore throat, nasal congestion and oral swelling.  Diagnoses and all orders for this visit:  PND (post-nasal drip) Comments: Causing sore throat. Ongoing. Treatment as below. Red flags reviewed.  Seasonal allergic rhinitis due to pollen -     montelukast (SINGULAIR) 10 MG tablet; Take 1 tablet (10 mg total) by mouth at bedtime. -     predniSONE (DELTASONE) 5 MG tablet; 6-5-4-3-2-1-off -     Ambulatory referral to Allergy    . Reviewed expectations re: course of current medical issues. . Discussed self-management of symptoms. . Outlined signs and symptoms indicating need for more acute intervention. . Patient verbalized understanding and all questions were answered. . See orders for this visit as documented in the electronic medical record. . Patient received an After-Visit Summary.  CMA served  as scribe during this visit. History, Physical, and Plan performed by medical provider. Documentation and orders reviewed and attested to. Briscoe Deutscher, D.O.  Briscoe Deutscher, D.O. [place], [facility]

## 2017-04-17 NOTE — Progress Notes (Signed)
Pre visit review using our clinic review tool, if applicable. No additional management support is needed unless otherwise documented below in the visit note. 

## 2017-04-19 ENCOUNTER — Telehealth: Payer: Self-pay | Admitting: Family Medicine

## 2017-04-19 NOTE — Telephone Encounter (Signed)
Patient is calling to report symptoms from taking script for Singulair. Please give her a call back to discuss.   Thank you,  -LL

## 2017-04-19 NOTE — Telephone Encounter (Signed)
Spoke with patient.  Advised her to stop taking Singulair.  Per Dr. Juleen China, patient needs to see allergy or ENT.  No other medications to try at this time due to patient's sensitivities.  We will try to get her in for an earlier appointment with ENT or allergy.  Patient verbalized understanding.

## 2017-04-20 ENCOUNTER — Other Ambulatory Visit (INDEPENDENT_AMBULATORY_CARE_PROVIDER_SITE_OTHER): Payer: PPO

## 2017-04-20 DIAGNOSIS — F419 Anxiety disorder, unspecified: Secondary | ICD-10-CM

## 2017-04-20 DIAGNOSIS — E782 Mixed hyperlipidemia: Secondary | ICD-10-CM

## 2017-04-20 DIAGNOSIS — R030 Elevated blood-pressure reading, without diagnosis of hypertension: Secondary | ICD-10-CM | POA: Diagnosis not present

## 2017-04-20 LAB — LIPID PANEL
Cholesterol: 213 mg/dL — ABNORMAL HIGH (ref 0–200)
HDL: 63.6 mg/dL (ref >39.00)
LDL Cholesterol: 131 mg/dL — ABNORMAL HIGH (ref 0–99)
NonHDL: 149.83
Total CHOL/HDL Ratio: 3
Triglycerides: 92 mg/dL (ref 0.0–149.0)
VLDL: 18.4 mg/dL (ref 0.0–40.0)

## 2017-04-20 NOTE — Telephone Encounter (Signed)
Patient is very happy with new appointment date.  Gave her all information about date, time, and location.  Asked her to stop taking antihistamines 3 days prior to appointment.  Patient states she is not taking any antihistamines.  She was very thankful for earlier appointment and verbalized understanding of all information provided.

## 2017-04-20 NOTE — Telephone Encounter (Signed)
Natalie Erickson was able to get patient an earlier appointment at [facility] on 05/03/2017 @ 8:45 am with Dr. Donneta Romberg.  Patient is to stop any antihistamines 3 days prior to her appointment.

## 2017-04-20 NOTE — Addendum Note (Signed)
Addended by: Frutoso Chase A on: 04/20/2017 08:12 AM   Modules accepted: Orders

## 2017-04-21 DIAGNOSIS — R0982 Postnasal drip: Secondary | ICD-10-CM | POA: Diagnosis not present

## 2017-04-21 DIAGNOSIS — J018 Other acute sinusitis: Secondary | ICD-10-CM | POA: Diagnosis not present

## 2017-04-21 DIAGNOSIS — K146 Glossodynia: Secondary | ICD-10-CM | POA: Diagnosis not present

## 2017-04-24 ENCOUNTER — Telehealth: Payer: Self-pay | Admitting: *Deleted

## 2017-04-24 ENCOUNTER — Other Ambulatory Visit: Payer: Self-pay | Admitting: *Deleted

## 2017-04-24 DIAGNOSIS — F419 Anxiety disorder, unspecified: Secondary | ICD-10-CM

## 2017-04-24 DIAGNOSIS — M9902 Segmental and somatic dysfunction of thoracic region: Secondary | ICD-10-CM | POA: Diagnosis not present

## 2017-04-24 DIAGNOSIS — M9901 Segmental and somatic dysfunction of cervical region: Secondary | ICD-10-CM | POA: Diagnosis not present

## 2017-04-24 NOTE — Telephone Encounter (Signed)
Patient called back in. Stated that she was running short on time, and that she will call us back around 3:30 or 4:00 today

## 2017-04-24 NOTE — Telephone Encounter (Signed)
Please advise on further details requested if warranted or would like to schedule appointment:  Patient called in on Friday, 04/21/17 at 7:00pm with complaints of sore, red, and swollen throat. Concerns recent medication, prednisone, could be what is causing it. Per [facility], patient denied difficulty "getting air."  Patient was instructed to go to ED/UC.   No encounter for ED/UC was noted in chart. Attempted to contact patient. Left voicemail to call office back.     ------------------------------------------------------------------------------------------------------- PLEASE NOTE: All timestamps contained within this report are represented as [place] Standard Time. CONFIDENTIALTY NOTICE: This fax transmission is intended only for the addressee. It contains information that is legally privileged, confidential or otherwise protected from use or disclosure. If you are not the intended recipient, you are strictly prohibited from reviewing, disclosing, copying using or disseminating any of this information or taking any action in reliance on or regarding this information. If you have received this fax in error, please notify us immediately by telephone so that we can arrange for its return to [place]. Phone: [phone], Toll-Free: [phone], Fax: [phone] Page: 1 of 2 Call Id: [id number] [facility] [place] Patient Name: Natalie Erickson Gender: Female DOB: 02-07-1951 Age: 66 Y 62 M 4 D Return Phone Number: [phone] (Primary) City/State/Zip: [place] [address] Client [facility] Client Site [facility] Night Physician Briscoe Deutscher- DO Who Is Calling Patient / Member / Family / Caregiver Call Type Triage / Clinical Relationship To Patient Self Return Phone Number [phone] (Primary) Chief Complaint Sore Throat Reason for Call Symptomatic /  Request for Health Information Initial Comment Caller states she has been on prednisone for 5 days and she has a sore, red, and swollen throat. Caller stated that she is wondering if it could be the medication doing it, so she is wondering if she should finish off the medication, she has a pill tonight and one in the morning. Info pasted into Epic No Nurse Assessment Nurse: Lindalou Hose, RN, Marcie Bal Date/Time (Eastern Time): 04/21/2017 7:00:52 PM Confirm and document reason for call. If symptomatic, describe symptoms. ---Caller states that she has a sore throat. States is currently on Prednisone for allergies. States throat was getting better but took Zyrtec last PM and thinks she is allergic to it because her throat swelled up and it is sore agian. No fever. Does the PT have any chronic conditions? (i.e. diabetes, asthma, etc.) ---Yes List chronic conditions. ---Allergies Guidelines Guideline Title Affirmed Question Sore Throat Patient sounds very sick or weak to the triager Disp. Time Eilene Ghazi Time) Disposition Final User 04/21/2017 7:10:48 PM Go to ED Now (or PCP triage) Yes Lindalou Hose, RN, [facility] Advice Given Per Guideline * IF NO PCP TRIAGE: You need to be seen. Go to the [facility] within the next hour. Leave as soon as you can. DRIVING: Another adult should drive. Comments User: Nat Math, RN Date/Time Eilene Ghazi Time): 04/21/2017 7:15:21 PM PLEASE NOTE: All timestamps contained within this report are represented as [place] Standard Time. CONFIDENTIALTY NOTICE: This fax transmission is intended only for the addressee. It contains information that is legally privileged, confidential or otherwise protected from use or disclosure. If you are not the intended recipient, you are strictly prohibited from reviewing, disclosing, copying using or disseminating any of this information or taking any action in reliance on or regarding this information. If you have received  this fax  in error, please notify us immediately by telephone so that we can arrange for its return to [place]. Phone: [phone], Toll-Free: [phone], Fax: [phone] Page: 2 of 2 Call Id: [id number] Comments Caller states that her throat still feels very sore and swollen and she was having difficulty breathing after taking the Zyrtec last night. States is still having some difficulty due to her throat symptoms but is not having trouble "getting air."

## 2017-04-24 NOTE — Telephone Encounter (Signed)
Spoke with patient and she was seen at an urgent care for the symptoms. She was given a Zpak, but has not taken medication. She has also been to a chiropractor today where they did laser treatment and acupuncture on her sinuses to see if this would relieve symptoms. She said she could not tell a difference yet. She will keep her appointment with allergy on May 9th. She will call with any other problems.

## 2017-04-24 NOTE — Telephone Encounter (Signed)
Left message for patient to return call.

## 2017-04-25 ENCOUNTER — Other Ambulatory Visit: Payer: Self-pay

## 2017-04-25 NOTE — Patient Outreach (Signed)
[facility]) Care Management  04/25/2017  Natalie Erickson March 05, 1951 [id number]  TELEPHONE SCREENING Referral date:04/24/17 Referral source: primary MD Referral reason: Consult for pharmacy assistance and nurse access Insurance: Health team advantage Outreach Attempt #1  Telephone call to patient regarding primary MD referral. Unable to reach patient. HIPAA compliant voice message left with call back phone number.   PLAN: RNCM will attempt 2nd telephone call to patient within  1 week .  Quinn Plowman RN,BSN,CCM [facility] Telephonic  [phone]

## 2017-04-26 ENCOUNTER — Telehealth: Payer: Self-pay | Admitting: Family Medicine

## 2017-04-26 NOTE — Telephone Encounter (Signed)
Patient called requesting to speak to CMA about sinuses and what else she can do since she is still having difficulty with this. Please call and advise patient.

## 2017-04-26 NOTE — Telephone Encounter (Signed)
Spoke with patient and advised that there is not much more than we can offer her due to being sensitive to so many medications. I have gotten her in with ENT 04/27/17. Patient verbalized understanding. Patient stated that she would like something for her anxiety. Per Dr. Juleen China she would like to wait until Dr. Lucia Gaskins clears her then patient can make an appointment to discuss her anxiety. Patient verbalized understanding.

## 2017-04-27 ENCOUNTER — Other Ambulatory Visit: Payer: Self-pay

## 2017-04-27 DIAGNOSIS — J329 Chronic sinusitis, unspecified: Secondary | ICD-10-CM | POA: Diagnosis not present

## 2017-04-27 DIAGNOSIS — J300 Vasomotor rhinitis: Secondary | ICD-10-CM | POA: Diagnosis not present

## 2017-04-27 NOTE — Patient Outreach (Signed)
[place] [facility]) Care Management  04/27/2017  Celines Femia 07/25/1951 [id number]   TELEPHONE SCREENING Referral date:04/24/17 Referral source: primary MD Referral reason: Consult for pharmacy assistance and nurse access Insurance: Health team advantage Outreach Attempt #2  Telephone call to patient regarding primary MD referral. Attempted contact at patients listed number and emergency contact phone number.  Unable to reach. HIPAA compliant voice messages left with call back phone number.   PLAN: RNCM will attempt 3rd telephone call to patient within  1 week .  Quinn Plowman RN,BSN,CCM [facility] Telephonic  [phone]

## 2017-05-01 ENCOUNTER — Telehealth: Payer: Self-pay | Admitting: Family Medicine

## 2017-05-01 ENCOUNTER — Other Ambulatory Visit: Payer: Self-pay

## 2017-05-01 NOTE — Telephone Encounter (Signed)
[place] called to advise that patient has refused coming to [facility] and wanted to be sure Dr. Juleen China was aware. If you have any questions please call [phone].

## 2017-05-01 NOTE — Patient Outreach (Signed)
[facility]) Care Management  05/01/2017  Natalie Erickson [birth year] [id number]  TELEPHONE SCREENING Referral date:04/24/17 Referral source: Primary MD Referral reason: pharmacy assistance, nurse access Insurance: Health team advantage  SUBJECTIVE: Telephone call to patient regarding primary MD referral. HIPAA verified with patient. Discussed and offered [facility] care management services. Patient refused services. Patient states she does not have any nursing or pharmacy needs. 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  Unable to perform screening due to patients refusal. RNCM gave patient [facility] care management contact phone number and gave name and number for 24 hour nurse advise line.   PROVIDERS: Dr. Briscoe Deutscher   PLAN: RNCM will forward patient to care management assistant to close due to refusal of services.  RNCM contacted Kera with Dr. Juleen China office and notified of patients refusal of services.  RNCm will send refusal in writing to primary MD office.   Quinn Plowman RN,BSN,CCM [facility] Telephonic  [phone]

## 2017-05-01 NOTE — Telephone Encounter (Signed)
FYI

## 2017-05-03 ENCOUNTER — Emergency Department (HOSPITAL_COMMUNITY): Payer: PPO

## 2017-05-03 ENCOUNTER — Emergency Department (HOSPITAL_COMMUNITY)
Admission: EM | Admit: 2017-05-03 | Discharge: 2017-05-03 | Disposition: A | Discharge status: Home / Self Care | Payer: PPO | Attending: Emergency Medicine | Admitting: Emergency Medicine

## 2017-05-03 ENCOUNTER — Encounter (HOSPITAL_COMMUNITY): Payer: Self-pay | Admitting: Emergency Medicine

## 2017-05-03 DIAGNOSIS — R1013 Epigastric pain: Secondary | ICD-10-CM | POA: Diagnosis not present

## 2017-05-03 DIAGNOSIS — K5901 Slow transit constipation: Secondary | ICD-10-CM | POA: Insufficient documentation

## 2017-05-03 DIAGNOSIS — Z79899 Other long term (current) drug therapy: Secondary | ICD-10-CM | POA: Diagnosis not present

## 2017-05-03 DIAGNOSIS — R1030 Lower abdominal pain, unspecified: Secondary | ICD-10-CM

## 2017-05-03 DIAGNOSIS — R109 Unspecified abdominal pain: Secondary | ICD-10-CM | POA: Diagnosis not present

## 2017-05-03 HISTORY — DX: Diaphragmatic hernia without obstruction or gangrene: K44.9

## 2017-05-03 LAB — COMPREHENSIVE METABOLIC PANEL
ALT: 14 U/L (ref 14–54)
AST: 19 U/L (ref 15–41)
Albumin: 4.7 g/dL (ref 3.5–5.0)
Alkaline Phosphatase: 112 U/L (ref 38–126)
Anion gap: 12 (ref 5–15)
BUN: 10 mg/dL (ref 6–20)
CO2: 26 mmol/L (ref 22–32)
Calcium: 10.5 mg/dL — ABNORMAL HIGH (ref 8.9–10.3)
Chloride: 102 mmol/L (ref 101–111)
Creatinine, Ser: 0.76 mg/dL (ref 0.44–1.00)
GFR calc Af Amer: >60 mL/min (ref >60)
GFR calc non Af Amer: >60 mL/min (ref >60)
Glucose, Bld: 132 mg/dL — ABNORMAL HIGH (ref 65–99)
Potassium: 3.8 mmol/L (ref 3.5–5.1)
Sodium: 140 mmol/L (ref 135–145)
Total Bilirubin: 0.5 mg/dL (ref 0.3–1.2)
Total Protein: 7.7 g/dL (ref 6.5–8.1)

## 2017-05-03 LAB — CBC
HCT: 42.4 % (ref 36.0–46.0)
Hemoglobin: 14.5 g/dL (ref 12.0–15.0)
MCH: 31.5 pg (ref 26.0–34.0)
MCHC: 34.2 g/dL (ref 30.0–36.0)
MCV: 92 fL (ref 78.0–100.0)
Platelets: 420 10*3/uL — ABNORMAL HIGH (ref 150–400)
RBC: 4.61 MIL/uL (ref 3.87–5.11)
RDW: 12.3 % (ref 11.5–15.5)
WBC: 11.9 10*3/uL — ABNORMAL HIGH (ref 4.0–10.5)

## 2017-05-03 LAB — URINALYSIS, ROUTINE W REFLEX MICROSCOPIC
Bilirubin Urine: NEGATIVE
Glucose, UA: NEGATIVE mg/dL
Ketones, ur: NEGATIVE mg/dL
Nitrite: NEGATIVE
Protein, ur: NEGATIVE mg/dL
Specific Gravity, Urine: 1.004 — ABNORMAL LOW (ref 1.005–1.030)
pH: 6 (ref 5.0–8.0)

## 2017-05-03 LAB — TROPONIN I: Troponin I: <0.03 ng/mL (ref <0.03)

## 2017-05-03 LAB — LIPASE, BLOOD: Lipase: 25 U/L (ref 11–51)

## 2017-05-03 MED ORDER — SODIUM CHLORIDE 0.9 % IV BOLUS (SEPSIS)
1000.0000 mL | Freq: Once | INTRAVENOUS | Status: AC
  Administered 2017-05-03: 1000 mL via INTRAVENOUS

## 2017-05-03 MED ORDER — ONDANSETRON 4 MG PO TBDP: 4.0000 mg | ORAL_TABLET | Freq: Three times a day (TID) | ORAL | 0 refills | Status: DC | PRN

## 2017-05-03 MED ORDER — GI COCKTAIL ~~LOC~~
30.0000 mL | Freq: Once | ORAL | Status: AC
  Administered 2017-05-03: 30 mL via ORAL
  Filled 2017-05-03: qty 30

## 2017-05-03 MED ORDER — IOPAMIDOL (ISOVUE-300) INJECTION 61%
INTRAVENOUS | Status: AC
  Administered 2017-05-03: 100 mL
  Filled 2017-05-03: qty 100

## 2017-05-03 MED ORDER — SUCRALFATE 1 GM/10ML PO SUSP: 1.0000 g | Freq: Three times a day (TID) | ORAL | 0 refills | Status: DC

## 2017-05-03 MED ORDER — ONDANSETRON HCL 4 MG/2ML IJ SOLN
4.0000 mg | Freq: Once | INTRAMUSCULAR | Status: AC
  Administered 2017-05-03: 4 mg via INTRAVENOUS
  Filled 2017-05-03: qty 2

## 2017-05-03 NOTE — ED Provider Notes (Signed)
[facility] DEPT Provider Note   CSN: [id number] Arrival date & time: 05/03/17  0744     History   Chief Complaint Chief Complaint  Patient presents with  . Abdominal Pain    HPI Natalie Erickson is a 66 y.o. female with pertinent pmh of IBS, hypertension and hyperlipidemia who presents to ED with epigastric abdominal "cramping" associated with constipation and nausea. Patient also reports "gas" in chest.  Patient stopped taking doxycycline for an acute bacterial sinusitis 2 weeks ago. One week above ago she developed constipated, she reports 3-4 stools in one week that are harder and smaller. She took MiraLAX last night and developed abdominal cramping and nausea. Past surgical history includes appendectomy and ovarian cyst removal. Patient denies fevers, vomiting, chest pain, shortness of breath, cough, urinary symptoms, vaginal bleeding or discharge. She has h/o acid reflux that is normally well controlled with Tums.   HPI  Past Medical History:  Diagnosis Date  . Anxiety 04/06/2017  . Dry eyes 04/06/2017  . Elevated BP without diagnosis of hypertension 04/06/2017  . Hiatal hernia   . Mixed hyperlipidemia 04/06/2017    Patient Active Problem List   Diagnosis Date Noted  . Mixed hyperlipidemia 04/06/2017  . Elevated BP without diagnosis of hypertension 04/06/2017  . Anxiety 04/06/2017  . Dry eyes 04/06/2017    History reviewed. No pertinent surgical history.  OB History    No data available       Home Medications    Prior to Admission medications   Medication Sig Start Date End Date Taking? Authorizing Provider  Cholecalciferol (VITAMIN D) 2000 units tablet Take by mouth.   Yes [provider]  omega-3 acid ethyl esters (LOVAZA) 1 g capsule Take 1 g by mouth daily.   Yes [provider]  polyethylene glycol (MIRALAX / GLYCOLAX) packet Take 17 g by mouth daily as needed.   Yes [provider]  montelukast (SINGULAIR) 10 MG tablet Take 1 tablet  (10 mg total) by mouth at bedtime. Patient not taking: Reported on 05/03/2017 04/17/17   Briscoe Deutscher, DO  ondansetron (ZOFRAN ODT) 4 MG disintegrating tablet Take 1 tablet (4 mg total) by mouth every 8 (eight) hours as needed for nausea or vomiting. 05/03/17   Kinnie Feil, PA-C  sucralfate (CARAFATE) 1 GM/10ML suspension Take 10 mLs (1 g total) by mouth 4 (four) times daily -  with meals and at bedtime. 05/03/17   Kinnie Feil, PA-C    Family History History reviewed. No pertinent family history.  Social History Social History  Substance Use Topics  . Smoking status: Never Smoker  . Smokeless tobacco: Never Used  . Alcohol use No     Allergies   Statins; Banana; Chocolate; Codeine; Cranberry; Latex; Monosodium glutamate; Penicillins; Antihistamines, loratadine-type; Montelukast; Sulfites; and Azithromycin   Review of Systems Review of Systems  Constitutional: Negative for diaphoresis and fever.  HENT: Negative for congestion.   Eyes: Negative for visual disturbance.  Respiratory: Negative for cough, chest tightness and shortness of breath.   Cardiovascular: Negative for chest pain and palpitations.  Gastrointestinal: Positive for abdominal pain, constipation and nausea. Negative for diarrhea and vomiting.  Genitourinary: Negative for difficulty urinating, dysuria, flank pain, frequency, hematuria, pelvic pain, vaginal bleeding and vaginal discharge.  Musculoskeletal: Negative for arthralgias and back pain.  Skin: Negative for rash.  Neurological: Negative for headaches.     Physical Exam Updated Vital Signs BP (!) 143/76   Pulse 84   Temp 98.5 F (36.9 C) (  Oral)   Resp 17   Ht 5\' 2"  (1.575 m)   Wt 70.8 kg   SpO2 94%   BMI 28.53 kg/m   Physical Exam  Constitutional: She is oriented to person, place, and time. She appears well-developed and well-nourished.  HENT:  Head: Normocephalic and atraumatic.  Nose: Nose normal.  Mouth/Throat: Oropharynx is clear  and moist. No oropharyngeal exudate.  Eyes: Conjunctivae and EOM are normal. Pupils are equal, round, and reactive to light.  Neck: Normal range of motion. Neck supple.  Cardiovascular: Normal rate, regular rhythm, normal heart sounds and intact distal pulses.   No murmur heard. Pulmonary/Chest: Effort normal and breath sounds normal. No respiratory distress. She has no wheezes. She has no rales. She exhibits no tenderness.  Abdominal: There is tenderness.  +Epigastric and diffuse lower abdominal pain  Abdomen is soft without distention, rigidity, guarding or rebound.  No surgical abdominal scars noted.  No pulsating masses.  Active bowel sounds throughout.  No suprapubic tenderness. No CVAT.  Negative Murphy's. Negative McBurney's. Negative Psoas sign.  Non palpable kidneys. No hepatosplenomegaly.   Musculoskeletal: Normal range of motion. She exhibits no deformity.  Lymphadenopathy:    She has no cervical adenopathy.  Neurological: She is alert and oriented to person, place, and time. No sensory deficit.  Skin: Skin is warm and dry. Capillary refill takes less than 2 seconds.  Psychiatric: She has a normal mood and affect. Her behavior is normal. Judgment and thought content normal.  Nursing note and vitals reviewed.    ED Treatments / Results  Labs (all labs ordered are listed, but only abnormal results are displayed) Labs Reviewed  COMPREHENSIVE METABOLIC PANEL - Abnormal; Notable for the following:       Result Value   Glucose, Bld 132 (*)    Calcium 10.5 (*)    All other components within normal limits  CBC - Abnormal; Notable for the following:    WBC 11.9 (*)    Platelets 420 (*)    All other components within normal limits  URINALYSIS, ROUTINE W REFLEX MICROSCOPIC - Abnormal; Notable for the following:    Color, Urine STRAW (*)    Specific Gravity, Urine 1.004 (*)    Hgb urine dipstick SMALL (*)    Leukocytes, UA SMALL (*)    Bacteria, UA RARE (*)    Squamous  Epithelial / LPF 0-5 (*)    All other components within normal limits  LIPASE, BLOOD  TROPONIN I    EKG  EKG Interpretation None       Radiology Dg Chest 2 View  Result Date: 05/03/2017 CLINICAL DATA:  Epigastric pain, hiatal hernia, hypertension. EXAM: CHEST  2 VIEW COMPARISON:  None in PACs FINDINGS: The lungs are mildly hyperinflated but clear. The heart and pulmonary vascularity are normal. The mediastinum is normal in width. There is no pleural effusion. There is faint calcification in the wall of the aortic arch. The bony thorax exhibits no acute abnormality. IMPRESSION: Mild hyperinflation may reflect chronic bronchitic change or may be voluntary. There is no pneumonia, CHF, nor other acute cardiopulmonary abnormality. Thoracic aortic atherosclerosis. Electronically Signed   By: David  Martinique M.D.   On: 05/03/2017 09:24   Ct Abdomen Pelvis W Contrast  Result Date: 05/03/2017 CLINICAL DATA:  Mid upper abdominal pain that started last night with nausea. EXAM: CT ABDOMEN AND PELVIS WITH CONTRAST TECHNIQUE: Multidetector CT imaging of the abdomen and pelvis was performed using the standard protocol following bolus administration of intravenous  contrast. CONTRAST:  127mL ISOVUE-300 IOPAMIDOL (ISOVUE-300) INJECTION 61% COMPARISON:  None. FINDINGS: Lower chest: Unremarkable. Hepatobiliary: No focal abnormality within the liver parenchyma. There is no evidence for gallstones, gallbladder wall thickening, or pericholecystic fluid. No intrahepatic or extrahepatic biliary dilation. Pancreas: No focal mass lesion. No dilatation of the main duct. No intraparenchymal cyst. No peripancreatic edema. Spleen: No splenomegaly. No focal mass lesion. Adrenals/Urinary Tract: No adrenal nodule or mass. 1 mm nonobstructing stone lower pole right kidney. 11 mm well-defined probable cyst lower pole right kidney. 9 mm lesion interpolar right kidney (image 34 series 3 and image 24 series 8) may enhance after IV  contrast administration. Multiple stones are seen in the left kidney ranging in size from 1 mm up to 6 mm. No left hydronephrosis. Multiple small cortical lesions left kidney cannot be definitively characterize. No evidence for hydroureter. The urinary bladder appears normal for the degree of distention. Stomach/Bowel: Stomach is nondistended. No gastric wall thickening. No evidence of outlet obstruction. Duodenum is normally positioned as is the ligament of Treitz. No small bowel wall thickening. No small bowel dilatation. The terminal ileum is normal. The appendix is not visualized, but there is no edema or inflammation in the region of the cecum. No gross colonic mass. No colonic wall thickening. No substantial diverticular change. Vascular/Lymphatic: There is abdominal aortic atherosclerosis without aneurysm. There is no gastrohepatic or hepatoduodenal ligament lymphadenopathy. No intraperitoneal or retroperitoneal lymphadenopathy. No pelvic sidewall lymphadenopathy. Reproductive: The uterus has normal CT imaging appearance. There is no adnexal mass. Other: No intraperitoneal free fluid. Musculoskeletal: Bone windows reveal no worrisome lytic or sclerotic osseous lesions. IMPRESSION: 1. Bilateral nonobstructing renal stones, left greater than right. 2. Potential hypoenhancing 9 mm lesion interpolar right kidney. MRI recommended to further evaluate. 3. Abdominal aortic atherosclerosis. Electronically Signed   By: Misty Stanley M.D.   On: 05/03/2017 11:28    Procedures Procedures (including critical care time)  Medications Ordered in ED Medications  sodium chloride 0.9 % bolus 1,000 mL (0 mLs Intravenous Stopped 05/03/17 0959)  ondansetron (ZOFRAN) injection 4 mg (4 mg Intravenous Given 05/03/17 0848)  iopamidol (ISOVUE-300) 61 % injection (100 mLs  Contrast Given 05/03/17 1056)  gi cocktail (Maalox,Lidocaine,Donnatal) (30 mLs Oral Given 05/03/17 1046)     Initial Impression / Assessment and Plan / ED  Course  I have reviewed the triage vital signs and the nursing notes.  Pertinent labs & imaging results that were available during my care of the patient were reviewed by me and considered in my medical decision making (see chart for details).  Clinical Course as of May 03 1237  Wed May 03, 2017  6440 IMPRESSION: Mild hyperinflation may reflect chronic bronchitic change or may be voluntary. There is no pneumonia, CHF, nor other acute cardiopulmonary abnormality.  Thoracic aortic atherosclerosis.  [CG]  0945 WBC: (!) 11.9 [CG]  1228 Spoke to Dr. Tery Sanfilippo regarding 9 mm renal lesion, he recommend outpatient MRI.  [CG]    Clinical Course User Index [CG] Kinnie Feil, PA-C   66 year old female with history of hypertension, hyperlipidemia, IBS, acid reflux presents to the ED with diffuse abdominal pain, nausea and constipation.  Patient took MiraLAX prior to symptom onset. She's had a bowel movement today. On exam vital signs are acceptable. Patient is nontoxic appearing. Epigastric abdomen is pretty tender, tenderness diffusely in the lower abdomen as well. Abdomen is soft without rigidity, distention or signs of peritonitis. Patient has some risk factors for ACS. We'll get belly labs and  EKG, chest x-ray and troponin. Initial differential diagnosis includes worsening GERD, pancreatitis, constipation cramps 2/2 Miralax, gastroenteritis, and less likely ACS.  No symptoms or signs to raise concern for PE.   Labs remarkable for slight leukocytosis with WBC 11.9. Rest of CBC and CMP, lipase, urinalysis are unremarkable. Troponin is negative. Pain has been constant since yesterday, no need for BP troponin. Chest x-ray and EKG unremarkable.   Repeat abdomen exam unchanged from initial.  Zofran helped nausea, but patient still tender in epigastric region and lower abdomen.  Given age, sudden onset of symptoms, diffuse tenderness on abdominal exam we'll get CT scan abdomen/pelvis.  Patient  agreeable.  CT scan negative for acute abdominal or pelvic pathology.  Incidental 9 mm lesions in kidney.  Contacted Dr. Tery Sanfilippo (radiology) who recommends outpatient MRI for f/u within 3-6 months.  Patient aware of CT scan findings and recommendation of MRI.  Patient requesting fluids, she tolerated PO challenge without complications prior to discharge. Patient discussed with supervising physician who evaluated patient and is agreeable with plan.    Final Clinical Impressions(s) / ED Diagnoses   Final diagnoses:  Epigastric abdominal pain  Lower abdominal pain  Slow transit constipation    New Prescriptions New Prescriptions   ONDANSETRON (ZOFRAN ODT) 4 MG DISINTEGRATING TABLET    Take 1 tablet (4 mg total) by mouth every 8 (eight) hours as needed for nausea or vomiting.   SUCRALFATE (CARAFATE) 1 GM/10ML SUSPENSION    Take 10 mLs (1 g total) by mouth 4 (four) times daily -  with meals and at bedtime.     Krithika, Tome, PA-C 05/03/17 1238    Kinnie Feil, PA-C 05/03/17 1239    Tegeler, Gwenyth Allegra, MD 05/04/17 1023

## 2017-05-03 NOTE — ED Triage Notes (Signed)
Pt states she has been constipated for a few days. So last night she took mira lax and has been having abd cramping ever since. Pt states she did have a bowel movement. Pt also complaining that she feels nauseated but cannot vomit.

## 2017-05-03 NOTE — ED Notes (Signed)
Patient transported to CT 

## 2017-05-03 NOTE — ED Notes (Signed)
Pt called out asking for water.

## 2017-05-03 NOTE — ED Notes (Signed)
Patient transported to X-ray 

## 2017-05-03 NOTE — Discharge Instructions (Signed)
Your lab work, chest x-ray, EKG and CT scan did not show any acute abnormalities to explain your pain.   The CT scan abdomen and pelvis found an incidental right kidney lesion (measuring 9 mm).  We recommend a follow up outpatient MRI to further characterize this lesions.  It is most likely a benign renal cyst however we cannot exclude any other etiologies.  Please contact you primary care provider and request an outpatient MRI within 3-6 months for follow.   Please take prescribed sucralfate suspension with meals, this will coat your stomach and esophagus.  Take zofran for nausea as needed.   Modify your diet to improve constipation: drink at least 2-3 L of water daily, stay active by walking or doing exercise, take a stool softener and fiber daily.  Use miralax as needed to help induce bowel movements. Avoid large amounts of ibuprofen or tylenol as this can cause stomach ulcers and inflammation.   CT abdomen and pelvis result IMPRESSION:  1. Bilateral nonobstructing renal stones, left greater than right.  2. Potential hypoenhancing 9 mm lesion interpolar right kidney. MRI  recommended to further evaluate.  3. Abdominal aortic atherosclerosis.

## 2017-05-04 DIAGNOSIS — H1013 Acute atopic conjunctivitis, bilateral: Secondary | ICD-10-CM | POA: Diagnosis not present

## 2017-05-04 DIAGNOSIS — M3501 Sicca syndrome with keratoconjunctivitis: Secondary | ICD-10-CM | POA: Diagnosis not present

## 2017-05-04 DIAGNOSIS — H04123 Dry eye syndrome of bilateral lacrimal glands: Secondary | ICD-10-CM | POA: Diagnosis not present

## 2017-05-04 DIAGNOSIS — H538 Other visual disturbances: Secondary | ICD-10-CM | POA: Diagnosis not present

## 2017-05-08 ENCOUNTER — Ambulatory Visit (INDEPENDENT_AMBULATORY_CARE_PROVIDER_SITE_OTHER): Payer: PPO | Admitting: Physician Assistant

## 2017-05-08 ENCOUNTER — Encounter: Payer: Self-pay | Admitting: Physician Assistant

## 2017-05-08 ENCOUNTER — Telehealth: Payer: Self-pay | Admitting: Family Medicine

## 2017-05-08 VITALS — BP 138/80 | HR 87 | Temp 98.0°F | Ht 62.0 in | Wt 156.4 lb

## 2017-05-08 DIAGNOSIS — R252 Cramp and spasm: Secondary | ICD-10-CM

## 2017-05-08 DIAGNOSIS — F419 Anxiety disorder, unspecified: Secondary | ICD-10-CM

## 2017-05-08 LAB — BASIC METABOLIC PANEL
BUN: 12 mg/dL (ref 6–23)
CO2: 28 mEq/L (ref 19–32)
Calcium: 10.1 mg/dL (ref 8.4–10.5)
Chloride: 103 mEq/L (ref 96–112)
Creatinine, Ser: 0.81 mg/dL (ref 0.40–1.20)
GFR: 75.31 mL/min (ref >60.00)
Glucose, Bld: 112 mg/dL — ABNORMAL HIGH (ref 70–99)
Potassium: 3.5 mEq/L (ref 3.5–5.1)
Sodium: 141 mEq/L (ref 135–145)

## 2017-05-08 LAB — MAGNESIUM: Magnesium: 2.2 mg/dL (ref 1.5–2.5)

## 2017-05-08 MED ORDER — ESCITALOPRAM OXALATE 5 MG PO TABS: 5.0000 mg | ORAL_TABLET | Freq: Every day | ORAL | 0 refills | Status: DC

## 2017-05-08 NOTE — Telephone Encounter (Signed)
Scheduling patient for an acute visit. Sx of stomach cramping, unable to eat, general weakness

## 2017-05-08 NOTE — Telephone Encounter (Signed)
Spoke with Aldona Bar about patient. She does not want me to triage patient due to patient has already been to ER.

## 2017-05-08 NOTE — Progress Notes (Signed)
Natalie Erickson is a 66 y.o. female here for leg cramps, shakey and anxious, nausea.  I acted as a Education administrator for [employer], PA-C Anselmo Pickler, LPN  History of Present Illness:   Chief Complaint  Patient presents with  . Leg cramps  . Shakey  . anxious  . Fatigue    HPI   Patient with a history of IBS and anxiety presents after ED visit on 05/03/17 for epigastric abdominal pain. Per records review of ED visit, patient had a negative work-up, including CT scan, chest xray, troponin. She did have slight leukocytosis with WBC of 11.9. She continued to have abdominal discomfort for a few days, but this has mostly resolved, she is taking Carafate religiously. Has been able to eat toast, poached egg, flat ginger ale, cups of fruit, chicken and asparagus, also had some almond milk. She had a BM this morning, was small and "pale". She notes that she has been on Carafate in the past and it also caused pale stools. She did spend a little bit of time on the elliptical yesterday, but woke up with cramps in legs last night and is feeling very fatigued. She has also woken up a few times with anxiety panics -- has nausea, SOB, heart racing. Still worried about her sinuses. Went to ENT 04/27/17 and was started on Nasonex but had an immediate reaction of swelling, so she stopped the medication and was told by ENT that they do not want to do any sinus imaging until she sees an allergist, which is supposed to happen this Wednesday. She also saw the eye doctor and was put on prednisone drops but had a reaction with swelling on the side of her face, so she had to discontinue this medication and is still waiting to hear back on what she is supposed to do.   She endorses significant health anxiety. She states that she is constantly thinking about whether or not she should go to the emergency room because of her health issues. She states that her husband is not a good support system -- he will take her to appointments but he  will not actively listen to her. She has very few coping mechanisms, and does like to listen to music. She cannot read/watch movies or do other things that involve close up vision because it bothers her eyes. She has a history of anxiety and states that she tolerated lexapro well in the past. She is very sensitive to medications and is willing to try a low dose of Lexapro. Additionally she is agreeable to talking with a therapist for her significant health anxiety.  PMHx, SurgHx, SocialHx, Medications, and Allergies were reviewed in the Visit Navigator and updated as appropriate.  Current Medications:   Current Outpatient Prescriptions:  .  Cholecalciferol (VITAMIN D) 2000 units tablet, Take by mouth., Disp: , Rfl:  .  omega-3 acid ethyl esters (LOVAZA) 1 g capsule, Take 1 g by mouth daily., Disp: , Rfl:  .  ondansetron (ZOFRAN ODT) 4 MG disintegrating tablet, Take 1 tablet (4 mg total) by mouth every 8 (eight) hours as needed for nausea or vomiting., Disp: 20 tablet, Rfl: 0 .  sucralfate (CARAFATE) 1 GM/10ML suspension, Take 10 mLs (1 g total) by mouth 4 (four) times daily -  with meals and at bedtime., Disp: 420 mL, Rfl: 0 .  escitalopram (LEXAPRO) 5 MG tablet, Take 1 tablet (5 mg total) by mouth daily., Disp: 30 tablet, Rfl: 0 .  polyethylene glycol (MIRALAX / GLYCOLAX)  packet, Take 17 g by mouth daily as needed., Disp: , Rfl:    Review of Systems:   Review of Systems  Constitutional: Positive for malaise/fatigue.  HENT: Positive for sinus pain and sore throat.   Respiratory: Positive for cough.   Gastrointestinal: Positive for nausea. Negative for vomiting.  Neurological: Positive for weakness and headaches.  Psychiatric/Behavioral: The patient is nervous/anxious.     Vitals:   Vitals:   05/08/17 0856  BP: 138/80  Pulse: 87  Temp: 98 F (36.7 C)  TempSrc: Oral  SpO2: 99%  Weight: 156 lb 6.1 oz (70.9 kg)  Height: 5\' 2"  (1.575 m)     Body mass index is 28.6 kg/m.  Physical  Exam:   Physical Exam  Constitutional: She appears well-developed. She is cooperative.  Non-toxic appearance. She does not have a sickly appearance. She does not appear ill. No distress.  HENT:  Head: Normocephalic and atraumatic.  Right Ear: Tympanic membrane, external ear and ear canal normal. Tympanic membrane is not erythematous, not retracted and not bulging.  Left Ear: Tympanic membrane, external ear and ear canal normal. Tympanic membrane is not erythematous, not retracted and not bulging.  Nose: Nose normal. Right sinus exhibits no maxillary sinus tenderness and no frontal sinus tenderness. Left sinus exhibits no maxillary sinus tenderness and no frontal sinus tenderness.  Mouth/Throat: Uvula is midline. Posterior oropharyngeal erythema present. No posterior oropharyngeal edema.  Eyes: Conjunctivae and lids are normal.  Neck: Trachea normal.  Cardiovascular: Normal rate, regular rhythm, S1 normal, S2 normal and normal heart sounds.   Pulmonary/Chest: Effort normal and breath sounds normal. She has no decreased breath sounds. She has no wheezes. She has no rhonchi. She has no rales.  Lymphadenopathy:    She has no cervical adenopathy.  Neurological: She is alert.  Skin: Skin is warm, dry and intact.  Psychiatric: Her speech is normal and behavior is normal. Her mood appears anxious.  Tearful  Nursing note and vitals reviewed.     Assessment and Plan:    [place] was seen today for leg cramps, shakey, anxious and fatigue.  Diagnoses and all orders for this visit:  Anxiety Patient reports doing well with Lexapro in the past, is agreeable to trialing 5 mg and setting up an appointment with a therapist. I advised her to seek medical attention if she has worsening thoughts or concerns. We reviewed ways to distract herself, including listening to music, calling family members or walking on treadmill. I would like for her to follow-up with [place] in 2 weeks. I feel as though a lot of her  symptoms may be stemming from underlying health anxiety. -     Basic metabolic panel -     Magnesium  Leg cramps Her abdominal pain has mostly resolved. She feels as though her leg cramps are secondary to working on the elliptical yesterday, but given recent poor diet and changes in bowel movements, will check BMP and magnesium. I encouraged adequate hydration and continued bland meal intake. -     Basic metabolic panel -     Magnesium  Other orders -     escitalopram (LEXAPRO) 5 MG tablet; Take 1 tablet (5 mg total) by mouth daily.   . Reviewed expectations re: course of current medical issues. . Discussed self-management of symptoms. . Outlined signs and symptoms indicating need for more acute intervention. . Patient verbalized understanding and all questions were answered. . See orders for this visit as documented in the electronic medical record. Marland Kitchen  Patient received an After-Visit Summary.  CMA or LPN served as scribe during this visit. History, Physical, and Plan performed by medical provider. Documentation and orders reviewed and attested to.  Inda Coke, PA-C

## 2017-05-08 NOTE — Patient Instructions (Addendum)
Start the Lexapro 5 mg.   If you develop any worse thoughts or concerns, please stop taking the medication and let someone know.  Stay hydrated.   Bland Diet A bland diet consists of foods that do not have a lot of fat or fiber. Foods without fat or fiber are easier for the body to digest. They are also less likely to irritate your mouth, throat, stomach, and other parts of your gastrointestinal tract. A bland diet is sometimes called a BRAT diet. What is my plan? Your health care provider or dietitian may recommend specific changes to your diet to prevent and treat your symptoms, such as:  Eating small meals often.  Cooking food until it is soft enough to chew easily.  Chewing your food well.  Drinking fluids slowly.  Not eating foods that are very spicy, sour, or fatty.  Not eating citrus fruits, such as oranges and grapefruit. What do I need to know about this diet?  Eat a variety of foods from the bland diet food list.  Do not follow a bland diet longer than you have to.  Ask your health care provider whether you should take vitamins. What foods can I eat? Grains   Hot cereals, such as cream of wheat. Bread, crackers, or tortillas made from refined white flour. Rice. Vegetables  Canned or cooked vegetables. Mashed or boiled potatoes. Fruits  Bananas. Applesauce. Other types of cooked or canned fruit with the skin and seeds removed, such as canned peaches or pears. Meats and Other Protein Sources  Scrambled eggs. Creamy peanut butter or other nut butters. Lean, well-cooked meats, such as chicken or fish. Tofu. Soups or broths. Dairy  Low-fat dairy products, such as milk, cottage cheese, or yogurt. Beverages  Water. Herbal tea. Apple juice. Sweets and Desserts  Pudding. Custard. Fruit gelatin. Ice cream. Fats and Oils  Mild salad dressings. Canola or olive oil. The items listed above may not be a complete list of allowed foods or beverages. Contact your dietitian for  more options.  What foods are not recommended? Foods and ingredients that are often not recommended include:  Spicy foods, such as hot sauce or salsa.  Fried foods.  Sour foods, such as pickled or fermented foods.  Raw vegetables or fruits, especially citrus or berries.  Caffeinated drinks.  Alcohol.  Strongly flavored seasonings or condiments. The items listed above may not be a complete list of foods and beverages that are not allowed. Contact your dietitian for more information.  This information is not intended to replace advice given to you by your health care provider. Make sure you discuss any questions you have with your health care provider. Document Released: 04/04/2016 Document Revised: 05/19/2016 Document Reviewed: 12/24/2014 Elsevier Interactive Patient Education  2017 [employer].

## 2017-05-09 ENCOUNTER — Emergency Department (HOSPITAL_COMMUNITY): Payer: PPO

## 2017-05-09 ENCOUNTER — Emergency Department (HOSPITAL_COMMUNITY)
Admission: EM | Admit: 2017-05-09 | Discharge: 2017-05-09 | Disposition: A | Discharge status: Home / Self Care | Payer: PPO | Attending: Emergency Medicine | Admitting: Emergency Medicine

## 2017-05-09 ENCOUNTER — Encounter (HOSPITAL_COMMUNITY): Payer: Self-pay

## 2017-05-09 ENCOUNTER — Telehealth: Payer: Self-pay | Admitting: Emergency Medicine

## 2017-05-09 DIAGNOSIS — R1010 Upper abdominal pain, unspecified: Secondary | ICD-10-CM | POA: Diagnosis not present

## 2017-05-09 DIAGNOSIS — K2900 Acute gastritis without bleeding: Secondary | ICD-10-CM | POA: Diagnosis not present

## 2017-05-09 DIAGNOSIS — K2901 Acute gastritis with bleeding: Secondary | ICD-10-CM | POA: Diagnosis not present

## 2017-05-09 DIAGNOSIS — Z91040 Latex allergy status: Secondary | ICD-10-CM | POA: Insufficient documentation

## 2017-05-09 DIAGNOSIS — R1011 Right upper quadrant pain: Secondary | ICD-10-CM | POA: Diagnosis not present

## 2017-05-09 DIAGNOSIS — Z79899 Other long term (current) drug therapy: Secondary | ICD-10-CM | POA: Insufficient documentation

## 2017-05-09 LAB — COMPREHENSIVE METABOLIC PANEL
ALT: 13 U/L — ABNORMAL LOW (ref 14–54)
AST: 16 U/L (ref 15–41)
Albumin: 4.4 g/dL (ref 3.5–5.0)
Alkaline Phosphatase: 100 U/L (ref 38–126)
Anion gap: 10 (ref 5–15)
BUN: 11 mg/dL (ref 6–20)
CO2: 25 mmol/L (ref 22–32)
Calcium: 9.7 mg/dL (ref 8.9–10.3)
Chloride: 105 mmol/L (ref 101–111)
Creatinine, Ser: 0.75 mg/dL (ref 0.44–1.00)
GFR calc Af Amer: >60 mL/min (ref >60)
GFR calc non Af Amer: >60 mL/min (ref >60)
Glucose, Bld: 100 mg/dL — ABNORMAL HIGH (ref 65–99)
Potassium: 3.4 mmol/L — ABNORMAL LOW (ref 3.5–5.1)
Sodium: 140 mmol/L (ref 135–145)
Total Bilirubin: 0.5 mg/dL (ref 0.3–1.2)
Total Protein: 7.3 g/dL (ref 6.5–8.1)

## 2017-05-09 LAB — CBC WITH DIFFERENTIAL/PLATELET
Basophils Absolute: 0 10*3/uL (ref 0.0–0.1)
Basophils Relative: 0 %
Eosinophils Absolute: 0 10*3/uL (ref 0.0–0.7)
Eosinophils Relative: 0 %
HCT: 31.6 % — ABNORMAL LOW (ref 36.0–46.0)
Hemoglobin: 10.6 g/dL — ABNORMAL LOW (ref 12.0–15.0)
Lymphocytes Relative: 17 %
Lymphs Abs: 2.1 10*3/uL (ref 0.7–4.0)
MCH: 30.9 pg (ref 26.0–34.0)
MCHC: 33.5 g/dL (ref 30.0–36.0)
MCV: 92.1 fL (ref 78.0–100.0)
Monocytes Absolute: 0.6 10*3/uL (ref 0.1–1.0)
Monocytes Relative: 5 %
Neutro Abs: 9.5 10*3/uL — ABNORMAL HIGH (ref 1.7–7.7)
Neutrophils Relative %: 78 %
Platelets: 496 10*3/uL — ABNORMAL HIGH (ref 150–400)
RBC: 3.43 MIL/uL — ABNORMAL LOW (ref 3.87–5.11)
RDW: 12.3 % (ref 11.5–15.5)
WBC: 12.2 10*3/uL — ABNORMAL HIGH (ref 4.0–10.5)

## 2017-05-09 LAB — LIPASE, BLOOD: Lipase: 26 U/L (ref 11–51)

## 2017-05-09 MED ORDER — ONDANSETRON HCL 4 MG/2ML IJ SOLN
4.0000 mg | Freq: Once | INTRAMUSCULAR | Status: DC
  Filled 2017-05-09: qty 2

## 2017-05-09 MED ORDER — PANTOPRAZOLE SODIUM 40 MG IV SOLR
40.0000 mg | Freq: Once | INTRAVENOUS | Status: AC
  Administered 2017-05-09: 40 mg via INTRAVENOUS
  Filled 2017-05-09: qty 40

## 2017-05-09 MED ORDER — TRAMADOL HCL 50 MG PO TABS: 50.0000 mg | ORAL_TABLET | Freq: Four times a day (QID) | ORAL | 0 refills | Status: DC | PRN

## 2017-05-09 MED ORDER — PANTOPRAZOLE SODIUM 20 MG PO TBEC: 20.0000 mg | DELAYED_RELEASE_TABLET | Freq: Two times a day (BID) | ORAL | 0 refills | Status: DC

## 2017-05-09 MED ORDER — SODIUM CHLORIDE 0.9 % IV BOLUS (SEPSIS)
1000.0000 mL | Freq: Once | INTRAVENOUS | Status: AC
  Administered 2017-05-09: 1000 mL via INTRAVENOUS

## 2017-05-09 MED ORDER — GI COCKTAIL ~~LOC~~
30.0000 mL | Freq: Once | ORAL | Status: AC
  Administered 2017-05-09: 30 mL via ORAL
  Filled 2017-05-09: qty 30

## 2017-05-09 NOTE — ED Triage Notes (Signed)
Pt refuses blood work in triage, states that she just has labs done today

## 2017-05-09 NOTE — ED Notes (Signed)
EDP and this RN completed rectal exam.

## 2017-05-09 NOTE — ED Triage Notes (Signed)
Pt states that since last week she has been having upper abd pain, seen last Wed for the same. Some nausea,no vomiting, no diarrhea.

## 2017-05-09 NOTE — Discharge Instructions (Signed)
Return immediately for any evidence of blood in your stool including bright red blood or dark black stool. Return for worsening pain, persistent vomiting, dizziness, fever or for any concerns. Follow-up immediately with your gastroenterologist or with [facility] gastroenterology. Take proton X twice daily. Continue Carafate and Zofran as needed for nausea. May also take over-the-counter Maalox. Avoid all NSAIDs including ibuprofen. Avoid spicy or acidic foods.

## 2017-05-09 NOTE — ED Notes (Signed)
Patient to u/s 

## 2017-05-09 NOTE — Telephone Encounter (Signed)
Patient is currently checked in to ED.   ----------------------------------------------------------------------------------------------------------    [facility] [place] Patient Name: Natalie Erickson Gender: Female DOB: 05-29-1951 Age: 66 Y 24 M 22 D Return Phone Number: [phone] (Primary) City/State/Zip: [place] [address] Client [facility] Client Site [facility] [place] Night Physician Briscoe Deutscher- DO Who Is Calling Patient / Member / Family / Caregiver Call Type Triage / Clinical Relationship To Patient Self Return Phone Number [phone] (Primary) Chief Complaint Abdominal Pain Reason for Call Symptomatic / Request for [place] states has stomach pain. Appointment Disposition EMR Appointment Not Necessary Info pasted into Epic No Nurse Assessment Nurse: Yolanda Bonine. RN, Sunday Spillers Date/Time Eilene Ghazi Time): 05/09/2017 4:01:40 AM Confirm and document reason for call. If symptomatic, describe symptoms. ---Caller states that her abdominal pain started intermittently last week. Went to ER thursday night, CAT scan done. nothing showed. On Carafate and Zofran. Taken and has some relief. Having some gas and cramping. Happened before when she took antibiotics in the past and just finished antibiotics 3 weeks ago. Doxycycline. Does the PT have any chronic conditions? (i.e. diabetes, asthma, etc.) ---Yes List chronic conditions. ---Borderline HTN Guidelines Guideline Title Affirmed Question Chest Pain Dizziness or lightheadedness Disp. Time Eilene Ghazi Time) Disposition Final User 05/09/2017 4:11:28 AM Go to ED Now Yes Yolanda Bonine. RN, Sunday Spillers Referrals [facility] - ED Care Advice Given Per Guideline GO TO ED NOW: You need to be seen in the Emergency Department. Go to the ER at  ___________ [place] now. Drive carefully. NOTE TO TRIAGER - DRIVING: * Another adult should drive. * If immediate transportation is not available via car or taxi, then the patient should be instructed to call [id number]. BRING MEDICINES: * Please bring a list of your current medicines when you go to the Emergency Department (ER). * It is also a good idea to bring the pill bottles too. This will help the doctor to make certain you are taking the right medicines and the right dose. CALL EMS IF: * Severe difficulty breathing occurs * Passes out or becomes too weak to stand * You become worse. CARE ADVICE given per Chest Pain (Adult) guideline. PLEASE NOTE: All timestamps contained within this report are represented as [place] Standard Time. CONFIDENTIALTY NOTICE: This fax transmission is intended only for the addressee. It contains information that is legally privileged, confidential or otherwise protected from use or disclosure. If you are not the intended recipient, you are strictly prohibited from reviewing, disclosing, copying using or disseminating any of this information or taking any action in reliance on or regarding this information. If you have received this fax in error, please notify us immediately by telephone so that we can arrange for its return to [place]. Phone: [phone], Toll-Free: [phone], Fax: [phone] Page: 2 of 2 Call Id: [id number] Comments User: Townsend Roger. RN Date/Time Eilene Ghazi Time): 05/09/2017 4:04:18 AM Caller stated she is constipated after finishing antibiotics and is currently taking probiotics. User: Townsend Roger. RN Date/Time Eilene Ghazi Time): 05/09/2017 4:05:08 AM Feels like a knot. User: Townsend Roger. RN Date/Time Eilene Ghazi Time): 05/09/2017 4:05:28 AM Cardiac work up done and nothing found User: Townsend Roger. 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 Took Zofran for the first  ti

## 2017-05-09 NOTE — ED Notes (Signed)
Pt reports abd pain that started 3 days ago. Pt was seen at this facility the same date and discharged home. Pt followed up with her PCP yesterday and called this morning to let them know she was having those pains again and she was told to come to the ED. Pt states she last ate at 1700 yesterday evening. Pt states she took zofran and it helped her.

## 2017-05-09 NOTE — ED Provider Notes (Signed)
[facility] DEPT Provider Note   CSN: [id number] Arrival date & time: 05/09/17  0454     History   Chief Complaint Chief Complaint  Patient presents with  . Abdominal Pain    HPI Natalie Erickson is a 66 y.o. female.  HPI Patient presents with several weeks of upper abdominal pain. Associated with nausea and retching. Denies any fever or chills. Denies melanotic or grossly bloody stools. Has had decrease oral intake. States symptoms started after being treated for sinusitis with doxycycline and course of prednisone. Denies NSAID use. Patient does not radiate into the chest or back. No urinary symptoms. Past Medical History:  Diagnosis Date  . Anxiety 04/06/2017  . Dry eyes 04/06/2017  . Elevated BP without diagnosis of hypertension 04/06/2017  . Hiatal hernia   . Mixed hyperlipidemia 04/06/2017    Patient Active Problem List   Diagnosis Date Noted  . Mixed hyperlipidemia 04/06/2017  . Elevated BP without diagnosis of hypertension 04/06/2017  . Anxiety 04/06/2017  . Dry eyes 04/06/2017    History reviewed. No pertinent surgical history.  OB History    No data available       Home Medications    Prior to Admission medications   Medication Sig Start Date End Date Taking? Authorizing Provider  Cholecalciferol (VITAMIN D) 2000 units tablet Take by mouth.    [provider]  escitalopram (LEXAPRO) 5 MG tablet Take 1 tablet (5 mg total) by mouth daily. 05/08/17   Inda Coke, PA  omega-3 acid ethyl esters (LOVAZA) 1 g capsule Take 1 g by mouth daily.    [provider]  ondansetron (ZOFRAN ODT) 4 MG disintegrating tablet Take 1 tablet (4 mg total) by mouth every 8 (eight) hours as needed for nausea or vomiting. 05/03/17   Kinnie Feil, PA-C  pantoprazole (PROTONIX) 20 MG tablet Take 1 tablet (20 mg total) by mouth 2 (two) times daily. 05/09/17   Julianne Rice, MD  polyethylene glycol [facility] / Floria Raveling) packet Take 17 g by mouth daily as needed.     [provider]  sucralfate (CARAFATE) 1 GM/10ML suspension Take 10 mLs (1 g total) by mouth 4 (four) times daily -  with meals and at bedtime. 05/03/17   Kinnie Feil, PA-C  traMADol (ULTRAM) 50 MG tablet Take 1 tablet (50 mg total) by mouth every 6 (six) hours as needed for severe pain. 05/09/17   Julianne Rice, MD    Family History No family history on file.  Social History Social History  Substance Use Topics  . Smoking status: Never Smoker  . Smokeless tobacco: Never Used  . Alcohol use No     Allergies   Statins; Banana; Chocolate; Codeine; Cranberry; Latex; Monosodium glutamate; Penicillins; Antihistamines, loratadine-type; Montelukast; Sulfites; and Azithromycin   Review of Systems Review of Systems  Constitutional: Positive for appetite change and fatigue. Negative for chills and fever.  HENT: Positive for congestion and sinus pressure. Negative for sore throat.   Respiratory: Negative for cough, chest tightness and shortness of breath.   Cardiovascular: Negative for chest pain.  Gastrointestinal: Positive for abdominal pain and nausea. Negative for blood in stool, constipation, diarrhea and vomiting.  Genitourinary: Negative for dysuria, flank pain and frequency.  Musculoskeletal: Negative for back pain, joint swelling, myalgias and neck pain.  Skin: Negative for rash.  Neurological: Negative for dizziness, weakness, light-headedness, numbness and headaches.  Psychiatric/Behavioral: The patient is nervous/anxious.   All other systems reviewed and are negative.    Physical Exam  Updated Vital Signs BP (!) 166/83 (BP Location: Left Arm)   Pulse 71   Temp 98.6 F (37 C) (Oral)   Resp 18   Ht 5\' 2"  (1.575 m)   Wt 155 lb (70.3 kg)   SpO2 100%   BMI 28.35 kg/m   Physical Exam  Constitutional: She is oriented to person, place, and time. She appears well-developed and well-nourished.  HENT:  Head: Normocephalic and atraumatic.  Mouth/Throat:  Oropharynx is clear and moist.  Eyes: EOM are normal. Pupils are equal, round, and reactive to light.  Neck: Normal range of motion. Neck supple.  Cardiovascular: Normal rate and regular rhythm.  Exam reveals no gallop and no friction rub.   No murmur heard. Pulmonary/Chest: Effort normal and breath sounds normal.  Abdominal: Soft. Bowel sounds are normal. There is tenderness. There is no rebound and no guarding.  Epigastric tenderness with palpation.  Genitourinary: Rectal exam shows guaiac negative stool.  Genitourinary Comments: External hemorrhoids. Rectal vault is empty  Musculoskeletal: Normal range of motion. She exhibits no edema or tenderness.  No CVA tenderness bilaterally. No lower Extremity swelling, asymmetry or tenderness.  Neurological: She is alert and oriented to person, place, and time.  Skin: Skin is warm and dry. Capillary refill takes less than 2 seconds. No rash noted. No erythema.  Psychiatric:  Anxious appearing  Nursing note and vitals reviewed.    ED Treatments / Results  Labs (all labs ordered are listed, but only abnormal results are displayed) Labs Reviewed  COMPREHENSIVE METABOLIC PANEL - Abnormal; Notable for the following:       Result Value   Potassium 3.4 (*)    Glucose, Bld 100 (*)    ALT 13 (*)    All other components within normal limits  CBC WITH DIFFERENTIAL/PLATELET - Abnormal; Notable for the following:    WBC 12.2 (*)    RBC 3.43 (*)    Hemoglobin 10.6 (*)    HCT 31.6 (*)    Platelets 496 (*)    Neutro Abs 9.5 (*)    All other components within normal limits  LIPASE, BLOOD    EKG  EKG Interpretation None       Radiology US Abdomen Limited  Result Date: 05/09/2017 CLINICAL DATA:  Seven day history of abdominal pain EXAM: US ABDOMEN LIMITED - RIGHT UPPER QUADRANT COMPARISON:  CT abdomen and pelvis May 03, 2017 FINDINGS: Gallbladder: No gallstones or wall thickening visualized. There is no pericholecystic fluid. No sonographic  Murphy sign noted by sonographer. Common bile duct: Diameter: 3 mm. No intrahepatic or extrahepatic biliary duct dilatation. Liver: No focal lesion identified. Within normal limits in parenchymal echogenicity. IMPRESSION: Study within normal limits. Electronically Signed   By: Lowella Grip III M.D.   On: 05/09/2017 08:53    Procedures Procedures (including critical care time)  Medications Ordered in ED Medications  pantoprazole (PROTONIX) injection 40 mg (40 mg Intravenous Given 05/09/17 0805)  gi cocktail (Maalox,Lidocaine,Donnatal) (30 mLs Oral Given 05/09/17 0805)  sodium chloride 0.9 % bolus 1,000 mL (0 mLs Intravenous Stopped 05/09/17 1105)     Initial Impression / Assessment and Plan / ED Course  I have reviewed the triage vital signs and the nursing notes.  Pertinent labs & imaging results that were available during my care of the patient were reviewed by me and considered in my medical decision making (see chart for details).     Patient was seen in the emergency department on 5/9. Had CT abdomen without acute  abnormality at that time. Suspect the patient has gastritis likely induced by prednisone. Hemoglobin is decreased from 14 to 10 today. Hemoccult stool is negative though scant sample. Patient given Protonix, IV fluids and GI cocktail with improvement of her symptoms.  Discussed with GI on call. Recommend Protonix twice a day and follow-up in the office. Patient's been given very strict return precautions and hss voiced understanding. Final Clinical Impressions(s) / ED Diagnoses   Final diagnoses:  Acute gastritis, presence of bleeding unspecified, unspecified gastritis type    New Prescriptions Discharge Medication List as of 05/09/2017 10:53 AM    START taking these medications   Details  pantoprazole (PROTONIX) 20 MG tablet Take 1 tablet (20 mg total) by mouth 2 (two) times daily., Starting Tue 05/09/2017, Print    traMADol (ULTRAM) 50 MG tablet Take 1 tablet (50  mg total) by mouth every 6 (six) hours as needed for severe pain., Starting Tue 05/09/2017, Print         Julianne Rice, MD 05/10/17 [id number]

## 2017-05-10 DIAGNOSIS — J300 Vasomotor rhinitis: Secondary | ICD-10-CM | POA: Diagnosis not present

## 2017-05-10 DIAGNOSIS — J309 Allergic rhinitis, unspecified: Secondary | ICD-10-CM | POA: Diagnosis not present

## 2017-05-11 ENCOUNTER — Ambulatory Visit (INDEPENDENT_AMBULATORY_CARE_PROVIDER_SITE_OTHER): Payer: PPO | Admitting: Physician Assistant

## 2017-05-11 ENCOUNTER — Encounter: Payer: Self-pay | Admitting: Physician Assistant

## 2017-05-11 ENCOUNTER — Other Ambulatory Visit (INDEPENDENT_AMBULATORY_CARE_PROVIDER_SITE_OTHER): Payer: PPO

## 2017-05-11 VITALS — BP 100/60 | HR 106 | Ht 62.0 in | Wt 155.0 lb

## 2017-05-11 DIAGNOSIS — D649 Anemia, unspecified: Secondary | ICD-10-CM | POA: Diagnosis not present

## 2017-05-11 DIAGNOSIS — R1013 Epigastric pain: Secondary | ICD-10-CM | POA: Diagnosis not present

## 2017-05-11 DIAGNOSIS — Z86010 Personal history of colonic polyps: Secondary | ICD-10-CM | POA: Diagnosis not present

## 2017-05-11 DIAGNOSIS — R110 Nausea: Secondary | ICD-10-CM

## 2017-05-11 DIAGNOSIS — F411 Generalized anxiety disorder: Secondary | ICD-10-CM

## 2017-05-11 LAB — CBC WITH DIFFERENTIAL/PLATELET
Basophils Absolute: 0.1 10*3/uL (ref 0.0–0.1)
Basophils Relative: 0.4 % (ref 0.0–3.0)
Eosinophils Absolute: 0 10*3/uL (ref 0.0–0.7)
Eosinophils Relative: 0.1 % (ref 0.0–5.0)
HCT: 40 % (ref 36.0–46.0)
Hemoglobin: 13.6 g/dL (ref 12.0–15.0)
Lymphocytes Relative: 10.1 % — ABNORMAL LOW (ref 12.0–46.0)
Lymphs Abs: 1.6 10*3/uL (ref 0.7–4.0)
MCHC: 34.1 g/dL (ref 30.0–36.0)
MCV: 92 fl (ref 78.0–100.0)
Monocytes Absolute: 0.8 10*3/uL (ref 0.1–1.0)
Monocytes Relative: 5 % (ref 3.0–12.0)
Neutro Abs: 13 10*3/uL — ABNORMAL HIGH (ref 1.4–7.7)
Neutrophils Relative %: 84.4 % — ABNORMAL HIGH (ref 43.0–77.0)
Platelets: 398 10*3/uL (ref 150.0–400.0)
RBC: 4.35 Mil/uL (ref 3.87–5.11)
RDW: 12.5 % (ref 11.5–15.5)
WBC: 15.4 10*3/uL — ABNORMAL HIGH (ref 4.0–10.5)

## 2017-05-11 MED ORDER — OMEPRAZOLE 40 MG PO CPDR: 40.0000 mg | DELAYED_RELEASE_CAPSULE | Freq: Every day | ORAL | 3 refills | Status: DC

## 2017-05-11 MED ORDER — ONDANSETRON HCL 4 MG PO TABS: ORAL_TABLET | ORAL | 1 refills | Status: DC

## 2017-05-11 MED ORDER — ALPRAZOLAM 0.25 MG PO TABS: ORAL_TABLET | ORAL | 0 refills | Status: DC

## 2017-05-11 NOTE — Progress Notes (Signed)
Subjective:    Patient ID: Natalie Erickson, female    DOB: 16-Aug-1951, 66 y.o.   MRN: [id number]  HPI Natalie Erickson is a pleasant 66 year old white female, new to GI today referred by ER/Dr. Lita Mains for evaluation of acute epigastric pain nausea and mild anemia. Patient says she had onset of symptoms the first week of May after she had just completed a course of doxycycline and a steroid Dosepak for sinusitis. She developed acute epigastric pain and nausea. She was evaluated in the ER on 05/03/2017. CT of the abdomen and pelvis with contrast showed multiple stones in the left kidney and a 9 mm lesion in the right kidney, consider MRI follow-up otherwise negative CT. Labs showed WBC of 11.9, hemoglobin 14.5, lipase within normal limits and LFTs within normal limits. She was started on Carafate suspension 4 times daily and given Zofran as needed for nausea . She had a second ER visit on 05/09/2017 with worsening epigastric pain and nausea. She was started on Protonix and given a GI cocktail. Labs showed WBC of 12.2, hemoglobin 10.6 hematocrit of 31.6 MCV of 92, potassium 3.4, LFTs within normal limits and again lipase within normal limits. She was referred to GI. She says she is unable to tolerate Protonix which gave her a significant headache, she says she feels weaker at this point and has had some scratching in her throat as well. She is feeling very poorly and weak. She is concerned she may need to be hospitalized. She continues to feel nauseated but has not been vomiting. Has been eating very little but did get some oatmeal down this morning. She is now constipated and hasn't had much of a bowel movement over the past 5 days. She passed a small amount of stool today which she says was very dark appearing. She says she has some mild chronic problems with anxiety but is very anxious at this point due to illness. She did have prior GI evaluation done in [place] about 9 years ago. She says she was told that  she had gastritis on EGD and may have had gastric polyps. Colonoscopy revealed polyps, type unclear She had follow-up colonoscopy by Dr.Katopes in [facility] August 2016 had polyps removed and was told to follow-up in 5 years.  Review of Systems Pertinent positive and negative review of systems were noted in the above HPI section.  All other review of systems was otherwise negative.  Outpatient Encounter Prescriptions as of 05/11/2017  Medication Sig  . Cholecalciferol (VITAMIN D) 2000 units tablet Take by mouth.  . escitalopram (LEXAPRO) 5 MG tablet Take 1 tablet (5 mg total) by mouth daily.  Marland Kitchen omega-3 acid ethyl esters (LOVAZA) 1 g capsule Take 1 g by mouth daily.  . ondansetron (ZOFRAN ODT) 4 MG disintegrating tablet Take 1 tablet (4 mg total) by mouth every 8 (eight) hours as needed for nausea or vomiting.  . pantoprazole (PROTONIX) 20 MG tablet Take 1 tablet (20 mg total) by mouth 2 (two) times daily.  . polyethylene glycol (MIRALAX / GLYCOLAX) packet Take 17 g by mouth daily as needed.  . sucralfate (CARAFATE) 1 GM/10ML suspension Take 10 mLs (1 g total) by mouth 4 (four) times daily -  with meals and at bedtime.  . traMADol (ULTRAM) 50 MG tablet Take 1 tablet (50 mg total) by mouth every 6 (six) hours as needed for severe pain.  Marland Kitchen ALPRAZolam (XANAX) 0.25 MG tablet Take 1/2 tablet twice daily as needed for anxiety.  Marland Kitchen omeprazole (PRILOSEC) 40  MG capsule Take 1 capsule (40 mg total) by mouth daily.  . ondansetron (ZOFRAN) 4 MG tablet Take 1 tab every 6 hours as needed for nause.a   No facility-administered encounter medications on file as of 05/11/2017.    Allergies  Allergen Reactions  . Statins Tinitus    Achy joints, muscle aches  . Banana Other (See Comments)    migraine  . Chocolate Other (See Comments)    migraine  . Codeine Nausea And Vomiting  . Cranberry Other (See Comments)    Cystitis  . Latex Rash  . Monosodium Glutamate Other (See Comments)    Migraine  .  Penicillins Rash    Has patient had a PCN reaction causing immediate rash, facial/tongue/throat swelling, SOB or lightheadedness with hypotension: Yes Has patient had a PCN reaction causing severe rash involving mucus membranes or skin necrosis: No Has patient had a PCN reaction that required hospitalization No Has patient had a PCN reaction occurring within the last 10 years: No If all of the above answers are "NO", then may proceed with Cephalosporin use.   Marland Kitchen Antihistamines, Loratadine-Type     Throat swelling  . Montelukast Other (See Comments)    Throat swelling  . Sulfites Other (See Comments)    Nausea and headaches  . Azithromycin Other (See Comments) and Diarrhea   Patient Active Problem List   Diagnosis Date Noted  . Mixed hyperlipidemia 04/06/2017  . Elevated BP without diagnosis of hypertension 04/06/2017  . Anxiety 04/06/2017  . Dry eyes 04/06/2017   Social History   Social History  . Marital status: Married    Spouse name: N/A  . Number of children: N/A  . Years of education: N/A   Occupational History  . Not on file.   Social History Main Topics  . Smoking status: Never Smoker  . Smokeless tobacco: Never Used  . Alcohol use No  . Drug use: No  . Sexual activity: Yes   Other Topics Concern  . Not on file   Social History Narrative  . No narrative on file    Natalie Erickson's family history includes Breast cancer in her sister; Diabetes in her paternal grandmother; Hypertension in her mother; Lung cancer in her father.      Objective:    Vitals:   05/11/17 1053  BP: 100/60  Pulse: (!) 106    Physical Exam well-developed older white female, accompanied by her husband, patient fatigued-appearing and anxious blood pressure 100/60, pulse 100 height 5 foot 2 BMI 28.3. HEENT ;nontraumatic it on a normocephalic EOMI PERRLA sclera anicteric, Cardiovascular; regular rate and rhythm with S1-S2 no murmur rub or gallop, Pulmonary; clear bilaterally, Abdomen; soft,  she is tender in the epigastrium bowel sounds are present is no palpable mass or hepatosplenomegaly no guarding or rebound, Rectal ;exam not done patient brought a stool specimen with her-this was a small dark pellet of stool trace heme positive. Extremities ;no clubbing cyanosis or edema skin warm and dry, Neuropsych ;mood and affect appropriate, patient anxious       Assessment & Plan:   #58 66 year old female with 10-12 day history of persistent epigastric pain, nausea, or appetite and subsequent constipation. Symptoms onset after a course of doxycycline and steroids for sinusitis. Symptoms are very consistent with an acute gastritis, cannot rule out peptic ulcer disease. I do not think she is having any significant GI bleeding as has been constipated and stool is only trace heme positive. #2 anemia with drop in hemoglobin from  14-10.6 over the past week. #3 anxiety #4 hyperlipidemia #5 nephrolithiasis #6  9 mm right kidney lesion-to  small to characterize MRI recommended #7 history of colon polyps up-to-date with colonoscopy last colonoscopy August 2006 [place]. Was recommended for 5 year interval follow-up I do not have polyp path report  Plan; CBC was repeated stat-WBC 15.4 hemoglobin 13.6 hematocrit of 40 MCV of 92. Patient reassured that she was not having evidence of active GI bleeding. I do not feel that she requires hospitalization at this time. She is encouraged to go home to rest, start Zofran 4 mg every 6 hours around the clock and push fluids and full liquid diet Stop Protonix secondary to headache, we will start omeprazole 40 mg by mouth twice daily 2 weeks then once daily every morning Will schedule for EGD with Dr. Loletha Carrow tomorrow 05/12/2017. Procedure was discussed in detail with patient and her husband and they're agreeable to proceed.  Patient asked me for something for anxiety. She was given Xanax 0.25 mg, one half tablet twice daily as needed over the  next couple of days #6 and no refills  She is afraid to take a laxative at this point because of her abdominal pain, advised MiraLAX 17 g in 8 ounces of water daily once she feels she can tolerate. In short term have advised Dulcolax suppository daily or every other day until improved.  Will need to obtain her records from colonoscopy from 2016 so may determine need for interval follow-up and recall.  Patient should have MRI of the right kidney at some point after her acute issues have resolved.   Amy S Esterwood PA-C 05/11/2017   Cc: Briscoe Deutscher, DO

## 2017-05-11 NOTE — Patient Instructions (Addendum)
You have been scheduled for an endoscopy. Please follow written instructions given to you at your visit today. If you use inhalers (even only as needed), please bring them with you on the day of your procedure. Your physician has requested that you go to www.startemmi.com and enter the access code given to you at your visit today. This web site gives a general overview about your procedure. However, you should still follow specific instructions given to you by our office regarding your preparation for the procedure.  Full liquid diet over the next few days. Stop Protonix 40 mg.  We sent prescriptions for Omeprazole 40 twice daily for 7 days then once daily.  Zofran 4 mg for nausea, take 1 tab every 6 hours for the next few days.  Xanax 0.25- take 1/2 tablet twice daily as needed.   Take Dulcolax for constipation- 1 tablet daily as needed.

## 2017-05-12 ENCOUNTER — Encounter: Payer: Self-pay | Admitting: Gastroenterology

## 2017-05-12 ENCOUNTER — Ambulatory Visit (AMBULATORY_SURGERY_CENTER): Payer: PPO | Admitting: Gastroenterology

## 2017-05-12 VITALS — BP 148/82 | HR 77 | Temp 99.5°F | Resp 17 | Ht 62.0 in | Wt 155.0 lb

## 2017-05-12 DIAGNOSIS — F419 Anxiety disorder, unspecified: Secondary | ICD-10-CM | POA: Diagnosis not present

## 2017-05-12 DIAGNOSIS — I10 Essential (primary) hypertension: Secondary | ICD-10-CM | POA: Diagnosis not present

## 2017-05-12 DIAGNOSIS — R1013 Epigastric pain: Secondary | ICD-10-CM | POA: Diagnosis not present

## 2017-05-12 DIAGNOSIS — D649 Anemia, unspecified: Secondary | ICD-10-CM | POA: Diagnosis not present

## 2017-05-12 MED ORDER — SODIUM CHLORIDE 0.9 % IV SOLN: 500.0000 mL | INTRAVENOUS | Status: DC

## 2017-05-12 NOTE — Patient Instructions (Signed)
YOU HAD AN ENDOSCOPIC PROCEDURE TODAY AT [facility]:   Refer to the procedure report that was given to you for any specific questions about what was found during the examination.  If the procedure report does not answer your questions, please call your gastroenterologist to clarify.  If you requested that your care partner not be given the details of your procedure findings, then the procedure report has been included in a sealed envelope for you to review at your convenience later.  YOU SHOULD EXPECT: Some feelings of bloating in the abdomen. Passage of more gas than usual.  Walking can help get rid of the air that was put into your GI tract during the procedure and reduce the bloating. If you had a lower endoscopy (such as a colonoscopy or flexible sigmoidoscopy) you may notice spotting of blood in your stool or on the toilet paper. If you underwent a bowel prep for your procedure, you may not have a normal bowel movement for a few days.  Please Note:  You might notice some irritation and congestion in your nose or some drainage.  This is from the oxygen used during your procedure.  There is no need for concern and it should clear up in a day or so.  SYMPTOMS TO REPORT IMMEDIATELY:    Following upper endoscopy (EGD)  Vomiting of blood or coffee ground material  New chest pain or pain under the shoulder blades  Painful or persistently difficult swallowing  New shortness of breath  Fever of 100F or higher  Black, tarry-looking stools  For urgent or emergent issues, a gastroenterologist can be reached at any hour by calling [phone].  May discontinue carafate if no symptom relief.  DIET:  We do recommend a small meal at first, but then you may proceed to your regular diet.  Drink plenty of fluids but you should avoid alcoholic beverages for 24 hours.  ACTIVITY:  You should plan to take it easy for the rest of today and you should NOT DRIVE or use heavy machinery until  tomorrow (because of the sedation medicines used during the test).    FOLLOW UP: Our staff will call the number listed on your records the next business day following your procedure to check on you and address any questions or concerns that you may have regarding the information given to you following your procedure. If we do not reach you, we will leave a message.  However, if you are feeling well and you are not experiencing any problems, there is no need to return our call.  We will assume that you have returned to your regular daily activities without incident.  If any biopsies were taken you will be contacted by phone or by letter within the next 1-3 weeks.  Please call us at [phone] if you have not heard about the biopsies in 3 weeks.    SIGNATURES/CONFIDENTIALITY: You and/or your care partner have signed paperwork which will be entered into your electronic medical record.  These signatures attest to the fact that that the information above on your After Visit Summary has been reviewed and is understood.  Full responsibility of the confidentiality of this discharge information lies with you and/or your care-partner.  Thank you for letting us take care of your healthcare needs today.

## 2017-05-12 NOTE — Progress Notes (Signed)
Thank you for sending this case to me and discussing it in clinic yesterday.  I have reviewed the entire note, and the outlined plan is what we discussed.  EGD this morning was normal.  Wilfrid Lund, MD

## 2017-05-12 NOTE — Op Note (Signed)
[place] Patient Name: Natalie Erickson Procedure Date: 05/12/2017 10:01 AM MRN: [id number] Endoscopist: [place]. Loletha Carrow , MD Age: 66 Referring MD:  Date of Birth: 16-Jun-1951 Gender: Female Account #: [id number] Procedure:                Upper GI endoscopy Indications:              Epigastric abdominal pain Medicines:                Monitored Anesthesia Care Procedure:                Pre-Anesthesia Assessment:                           - Prior to the procedure, a History and Physical                            was performed, and patient medications and                            allergies were reviewed. The patient's tolerance of                            previous anesthesia was also reviewed. The risks                            and benefits of the procedure and the sedation                            options and risks were discussed with the patient.                            All questions were answered, and informed consent                            was obtained. Prior Anticoagulants: The patient has                            taken no previous anticoagulant or antiplatelet                            agents. ASA Grade Assessment: II - A patient with                            mild systemic disease. After reviewing the risks                            and benefits, the patient was deemed in                            satisfactory condition to undergo the procedure.                           After obtaining informed consent, the endoscope was  passed under direct vision. Throughout the                            procedure, the patient's blood pressure, pulse, and                            oxygen saturations were monitored continuously. The                            Endoscope was introduced through the mouth, and                            advanced to the second part of duodenum. The upper                            GI endoscopy was accomplished  without difficulty.                            The patient tolerated the procedure well. Scope In: Scope Out: Findings:                 The esophagus was normal.                           Multiple semi-sessile fundic gland polyps were                            found in the gastric fundus and in the gastric body.                           The cardia and gastric fundus were normal on                            retroflexion.                           The examined duodenum was normal. Complications:            No immediate complications. Estimated Blood Loss:     Estimated blood loss: none. Impression:               - Normal esophagus.                           - Multiple fundic gland polyps. These are benign                            and typically the result of chronic use of PPI                            medication.                           - Normal examined duodenum.                           - No specimens collected.  This patient has had an extensive recent workup                            with labs,imaging and now endoscopy. She appears to                            have a prolonged pain response after recent                            antibiotics and prednisone. The pain is expected to                            resolve on its own. Recommendation:           - Patient has a contact number available for                            emergencies. The signs and symptoms of potential                            delayed complications were discussed with the                            patient. Return to normal activities tomorrow.                            Written discharge instructions were provided to the                            patient.                           - Resume previous diet.                           - Continue present medications. Discontinue                            carafate if it has not provided any symptom relief.                            - Return to primary care physician as previously                            scheduled. Henry L. Loletha Carrow, MD 05/12/2017 10:17:24 AM This report has been signed electronically.

## 2017-05-12 NOTE — Progress Notes (Signed)
  [facility] Post-op Note  Patient: Natalie Erickson  Procedure(s) Performed: endoscopy  Patient Location: [facility] - Recovery Area  Anesthesia Type: Deep Sedation/Propofol  Level of Consciousness: awake, oriented and patient cooperative  Airway and Oxygen Therapy: Patient Spontanous Breathing  Post-op Pain: none  Post-op Assessment:  Post-op Vital signs reviewed, Patient's Cardiovascular Status Stable, Respiratory Function Stable, Patent Airway, No signs of Nausea or vomiting and Pain level controlled  Post-op Vital Signs: Reviewed and stable  Complications: No apparent anesthesia complications  Beth E Eargle 10:17 AM

## 2017-05-12 NOTE — Progress Notes (Signed)
Pt's states no medical or surgical changes since previsit or office visit. 

## 2017-05-15 ENCOUNTER — Telehealth: Payer: Self-pay

## 2017-05-15 ENCOUNTER — Telehealth: Payer: Self-pay | Admitting: *Deleted

## 2017-05-15 NOTE — Telephone Encounter (Signed)
Attempted to reach pt. With follow up call following endoscopic procedure 05/09/17.   LM on pt. Ans. Machine.   Will try to reach pt. Again later today.

## 2017-05-15 NOTE — Telephone Encounter (Signed)
Spoke with patient and told her that Dr. Loletha Carrow does not want her to have a probiotic.

## 2017-05-15 NOTE — Telephone Encounter (Signed)
  Follow up Call-  Call back number 05/12/2017  Post procedure Call Back phone  # [phone]  Permission to leave phone message Yes     Patient questions:  Do you have a fever, pain , or abdominal swelling? No. Pain Score  0 *  Have you tolerated food without any problems? Yes.    Have you been able to return to your normal activities? Yes.    Do you have any questions about your discharge instructions: Diet   No. Medications  Yes. Follow up visit  No.  Do you have questions or concerns about your Care?   Pt. Reports her nausea and pains she had prior to the procedure continue.   Wonders if she should take a probiotic, and if so which one.   Told pt. I will try to find out for her and call her back later this afternoon.  Actions: * If pain score is 4 or above: No action needed, pain <4.

## 2017-05-16 ENCOUNTER — Encounter: Payer: Self-pay | Admitting: Family Medicine

## 2017-05-16 ENCOUNTER — Ambulatory Visit (INDEPENDENT_AMBULATORY_CARE_PROVIDER_SITE_OTHER): Payer: PPO | Admitting: Family Medicine

## 2017-05-16 VITALS — BP 128/82 | HR 93 | Temp 98.6°F | Ht 62.0 in | Wt 153.6 lb

## 2017-05-16 DIAGNOSIS — R5383 Other fatigue: Secondary | ICD-10-CM

## 2017-05-16 DIAGNOSIS — R768 Other specified abnormal immunological findings in serum: Secondary | ICD-10-CM

## 2017-05-16 DIAGNOSIS — R262 Difficulty in walking, not elsewhere classified: Secondary | ICD-10-CM

## 2017-05-16 DIAGNOSIS — J300 Vasomotor rhinitis: Secondary | ICD-10-CM | POA: Diagnosis not present

## 2017-05-16 DIAGNOSIS — F419 Anxiety disorder, unspecified: Secondary | ICD-10-CM

## 2017-05-16 DIAGNOSIS — R1013 Epigastric pain: Secondary | ICD-10-CM | POA: Diagnosis not present

## 2017-05-16 DIAGNOSIS — N3010 Interstitial cystitis (chronic) without hematuria: Secondary | ICD-10-CM

## 2017-05-16 LAB — CBC WITH DIFFERENTIAL/PLATELET
Basophils Absolute: 0.1 10*3/uL (ref 0.0–0.1)
Basophils Relative: 0.5 % (ref 0.0–3.0)
Eosinophils Absolute: 0 10*3/uL (ref 0.0–0.7)
Eosinophils Relative: 0.1 % (ref 0.0–5.0)
HCT: 43.7 % (ref 36.0–46.0)
Hemoglobin: 15 g/dL (ref 12.0–15.0)
Lymphocytes Relative: 12.7 % (ref 12.0–46.0)
Lymphs Abs: 1.2 10*3/uL (ref 0.7–4.0)
MCHC: 34.3 g/dL (ref 30.0–36.0)
MCV: 92.5 fl (ref 78.0–100.0)
Monocytes Absolute: 0.6 10*3/uL (ref 0.1–1.0)
Monocytes Relative: 6 % (ref 3.0–12.0)
Neutro Abs: 7.8 10*3/uL — ABNORMAL HIGH (ref 1.4–7.7)
Neutrophils Relative %: 80.7 % — ABNORMAL HIGH (ref 43.0–77.0)
Platelets: 479 10*3/uL — ABNORMAL HIGH (ref 150.0–400.0)
RBC: 4.73 Mil/uL (ref 3.87–5.11)
RDW: 12.7 % (ref 11.5–15.5)
WBC: 9.7 10*3/uL (ref 4.0–10.5)

## 2017-05-16 LAB — COMPREHENSIVE METABOLIC PANEL
ALT: 13 U/L (ref 0–35)
AST: 14 U/L (ref 0–37)
Albumin: 5 g/dL (ref 3.5–5.2)
Alkaline Phosphatase: 114 U/L (ref 39–117)
BUN: 10 mg/dL (ref 6–23)
CO2: 31 mEq/L (ref 19–32)
Calcium: 10.9 mg/dL — ABNORMAL HIGH (ref 8.4–10.5)
Chloride: 101 mEq/L (ref 96–112)
Creatinine, Ser: 0.79 mg/dL (ref 0.40–1.20)
GFR: 77.51 mL/min (ref >60.00)
Glucose, Bld: 113 mg/dL — ABNORMAL HIGH (ref 70–99)
Potassium: 4.5 mEq/L (ref 3.5–5.1)
Sodium: 143 mEq/L (ref 135–145)
Total Bilirubin: 0.5 mg/dL (ref 0.2–1.2)
Total Protein: 8.2 g/dL (ref 6.0–8.3)

## 2017-05-16 LAB — T4, FREE: Free T4: 1.12 ng/dL (ref 0.60–1.60)

## 2017-05-16 LAB — C-REACTIVE PROTEIN: CRP: 0.6 mg/dL (ref 0.5–20.0)

## 2017-05-16 LAB — SEDIMENTATION RATE: Sed Rate: 13 mm/hr (ref 0–30)

## 2017-05-16 LAB — TSH: TSH: 0.5 u[IU]/mL (ref 0.35–4.50)

## 2017-05-16 MED ORDER — PHENAZOPYRIDINE HCL 100 MG PO TABS: 100.0000 mg | ORAL_TABLET | Freq: Three times a day (TID) | ORAL | 0 refills | Status: DC | PRN

## 2017-05-16 MED ORDER — CLONAZEPAM 0.5 MG PO TABS: 0.5000 mg | ORAL_TABLET | Freq: Two times a day (BID) | ORAL | 0 refills | Status: DC | PRN

## 2017-05-16 NOTE — Progress Notes (Signed)
Natalie Erickson is a 66 y.o. female is here for follow up.  History of Present Illness:   Water quality scientist, CMA, acting as scribe for Dr. Juleen China.  CC:  Patient states she feels weak and cannot eat.  Has nausea, no vomiting.  States she is losing weight.  She recently saw GI and was started on Prilosec for GERD.  Patient states this causes her an "uncomfortable feeling" in her chest.  Patient is not taking Lexapro.  States she took it once and then stopped because she read that it can cause rhinitis.  Per patient, the allergist told her she has rhinitis.  States she has had diarrhea as well.  HPI:  1. Anxiety. Significantly worsened over the past few weeks. Took one dose of Lexapro before stopping. Worried that it will make rhinitis worse. No SI/HI.   3. Difficulty walking. Due to weakness. Feels very tired and deconditioned. Not strong enough to do yoga, like she used to.    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 Allergic rhinitis Dx by ENT. UNABLE TO TOLERATE ANY MEDICATIONS.    7. Dyspepsia. Recent EGD normal. On PPI. Does not like taking it due to it causing a stange feeling in her chest like her heart is skipping a beat. The PPI does seem to help her dyspepsia, though.   8. Interstitial cystitis. Hx of. She has noticed more symptoms. Asks for prn med.    There are no preventive care reminders to display for this patient.  PMHx, SurgHx, SocialHx, FamHx, Medications, and Allergies were reviewed in the Visit Navigator and updated as appropriate.   Patient Active Problem List   Diagnosis Date Noted  . Mixed hyperlipidemia 04/06/2017  . Elevated BP without diagnosis of hypertension 04/06/2017  . Anxiety 04/06/2017  . Dry eyes 04/06/2017   Social History  Substance Use Topics  . Smoking status: Never Smoker  . Smokeless tobacco: Never Used  . Alcohol use No   Current Medications and Allergies:   Current Outpatient Prescriptions:  .  Cholecalciferol (VITAMIN D) 2000 units tablet, Take  by mouth., Disp: , Rfl:  .  omega-3 acid ethyl esters (LOVAZA) 1 g capsule, Take 1 g by mouth daily., Disp: , Rfl:  .  omeprazole (PRILOSEC) 40 MG capsule, Take 1 capsule (40 mg total) by mouth daily., Disp: 90 capsule, Rfl: 3 .  ondansetron (ZOFRAN ODT) 4 MG disintegrating tablet, Take 1 tablet (4 mg total) by mouth every 8 (eight) hours as needed for nausea or vomiting., Disp: 20 tablet, Rfl: 0 .  ALPRAZolam (XANAX) 0.25 MG tablet, Take 1 tablet (0.25 mg total) by mouth 2 (two) times daily as needed for anxiety., Disp: 40 tablet, Rfl: 0  Allergies  Allergen Reactions  . Statins Tinitus    Achy joints, muscle aches  . Banana Other (See Comments)    migraine  . Chocolate Other (See Comments)    migraine  . Codeine Nausea And Vomiting  . Cranberry Other (See Comments)    Cystitis  . Latex Rash  . Monosodium Glutamate Other (See Comments)    Migraine  . Penicillins Rash    Has patient had a PCN reaction causing immediate rash, facial/tongue/throat swelling, SOB or lightheadedness with hypotension: Yes Has patient had a PCN reaction causing severe rash involving mucus membranes or skin necrosis: No Has patient had a PCN reaction that required hospitalization No Has patient had a PCN reaction occurring within the last 10 years: No If all of the above answers are "  NO", then may proceed with Cephalosporin use.   Marland Kitchen Antihistamines, Loratadine-Type     Throat swelling  . Montelukast Other (See Comments)    Throat swelling  . Sulfites Other (See Comments)    Nausea and headaches  . Azithromycin Other (See Comments) and Diarrhea   Review of Systems   Review of Systems  Constitutional: Positive for malaise/fatigue. Negative for chills and fever.  HENT: Negative for congestion, ear pain and sore throat.   Eyes: Negative for blurred vision.  Respiratory: Negative for cough and shortness of breath.   Cardiovascular: Negative for chest pain and palpitations.  Gastrointestinal: Positive for  diarrhea and nausea. Negative for abdominal pain and vomiting.  Genitourinary: Negative for frequency.  Musculoskeletal: Negative for back pain and neck pain.  Skin: Negative for rash.  Neurological: Positive for weakness. Negative for dizziness, loss of consciousness and headaches.  Endo/Heme/Allergies: Does not bruise/bleed easily.    Vitals:   Vitals:   05/16/17 1042  BP: 128/82  Pulse: 93  Temp: 98.6 F (37 C)  TempSrc: Oral  SpO2: 98%  Weight: 153 lb 9.6 oz (69.7 kg)  Height: 5\' 2"  (1.575 m)     Body mass index is 28.09 kg/m.   Physical Exam:   Physical Exam  Constitutional: She appears well-nourished.  HENT:  Head: Normocephalic and atraumatic.  Eyes: EOM are normal. Pupils are equal, round, and reactive to light.  Neck: Normal range of motion. Neck supple.  Cardiovascular: Normal rate, regular rhythm, normal heart sounds and intact distal pulses.   Pulmonary/Chest: Effort normal.  Abdominal: Soft.  Skin: Skin is warm.  Psychiatric: Her behavior is normal. Her mood appears anxious. Cognition and memory are not impaired. She expresses no homicidal and no suicidal ideation.  Nursing note and vitals reviewed.    Results for orders placed or performed in visit on 05/16/17  Comprehensive metabolic panel  Result Value Ref Range   Sodium 143 135 - 145 mEq/L   Potassium 4.5 3.5 - 5.1 mEq/L   Chloride 101 96 - 112 mEq/L   CO2 31 19 - 32 mEq/L   Glucose, Bld 113 (H) 70 - 99 mg/dL   BUN 10 6 - 23 mg/dL   Creatinine, Ser 0.79 0.40 - 1.20 mg/dL   Total Bilirubin 0.5 0.2 - 1.2 mg/dL   Alkaline Phosphatase 114 39 - 117 U/L   AST 14 0 - 37 U/L   ALT 13 0 - 35 U/L   Total Protein 8.2 6.0 - 8.3 g/dL   Albumin 5.0 3.5 - 5.2 g/dL   Calcium 10.9 (H) 8.4 - 10.5 mg/dL   GFR 77.51 >60.00 mL/min  CBC with Differential/Platelet  Result Value Ref Range   WBC 9.7 4.0 - 10.5 K/uL   RBC 4.73 3.87 - 5.11 Mil/uL   Hemoglobin 15.0 12.0 - 15.0 g/dL   HCT 43.7 36.0 - 46.0 %   MCV  92.5 78.0 - 100.0 fl   MCHC 34.3 30.0 - 36.0 g/dL   RDW 12.7 11.5 - 15.5 %   Platelets 479.0 (H) 150.0 - 400.0 K/uL   Neutrophils Relative % 80.7 (H) 43.0 - 77.0 %   Lymphocytes Relative 12.7 12.0 - 46.0 %   Monocytes Relative 6.0 3.0 - 12.0 %   Eosinophils Relative 0.1 0.0 - 5.0 %   Basophils Relative 0.5 0.0 - 3.0 %   Neutro Abs 7.8 (H) 1.4 - 7.7 K/uL   Lymphs Abs 1.2 0.7 - 4.0 K/uL   Monocytes Absolute 0.6 0.1 -  1.0 K/uL   Eosinophils Absolute 0.0 0.0 - 0.7 K/uL   Basophils Absolute 0.1 0.0 - 0.1 K/uL  ANA  Result Value Ref Range   Anit Nuclear Antibody(ANA) POS (A) NEGATIVE  C-reactive protein  Result Value Ref Range   CRP 0.6 0.5 - 20.0 mg/dL  Sedimentation rate  Result Value Ref Range   Sed Rate 13 0 - 30 mm/hr  TSH  Result Value Ref Range   TSH 0.50 0.35 - 4.50 uIU/mL  T4, free  Result Value Ref Range   Free T4 1.12 0.60 - 1.60 ng/dL  Anti-nuclear ab-titer (ANA titer)  Result Value Ref Range   ANA Pattern 1 SPECKLED (A)    ANA Titer 1 1:160 (H) titer   Lab Results  Component Value Date   PTH 48 05/18/2017   CALCIUM 10.4 05/18/2017   Assessment and Plan:   Jaelee was seen today for follow-up.  Diagnoses and all orders for this visit:  Anxiety Comments: Tolerated Xanax, given in ED. Tried one day of Lexapro before stopping due to fear of side-effects. Trial of Klonopin. Orders: -     Discontinue: clonazePAM (KLONOPIN) 0.5 MG tablet; Take 1 tablet (0.5 mg total) by mouth 2 (two) times daily as needed for anxiety.  Serum calcium elevated Comments: PTH higher end of normal. Several symptoms concerning for hyperparathyroidism. Will refer to Endocrine before adding further labs. Orders: -     PTH, Intact and Calcium; Future -     Ambulatory referral to Endocrinology    Difficulty walking Comments: Due to deconditioning, weakness. PT ordered for strengthening. Orders: -     Ambulatory referral to Physical Therapy -     Anti-nuclear ab-titer (ANA  titer)  Fatigue, unspecified type Comments: Labs ordered. See ANA, CALCIUM. Orders: -     Comprehensive metabolic panel -     CBC with Differential/Platelet -     ANA -     C-reactive protein -     Sedimentation rate -     TSH -     T4, free -     Anti-nuclear ab-titer (ANA titer)  ANA positive Comments: Likely normal, but will have Rheumatology evaluate. Orders: -     Ambulatory referral to Rheumatology  Vasomotor rhinitis Comments: Unable to tolerate antihistamines, including astelin nasal spray.  Dyspepsia Comments: Due, in part, to Doxy and Prednisone. EGD essentially normal. She would like to stop PPI. Okay to try.  Interstitial cystitis Comments: Hx of. With flares. Has tolerated Pyridium in the past. Rx given for prn. Orders: -     phenazopyridine (PYRIDIUM) 100 MG tablet; Take 1 tablet (100 mg total) by mouth 3 (three) times daily as needed for pain.    . Reviewed expectations re: course of current medical issues. . Discussed self-management of symptoms. . Outlined signs and symptoms indicating need for more acute intervention. . Patient verbalized understanding and all questions were answered. Marland Kitchen Health Maintenance issues including appropriate healthy diet, exercise, and smoking avoidance were discussed with patient. . See orders for this visit as documented in the electronic medical record. . Patient received an After Visit Summary.  CMA served as Education administrator during this visit. History, Physical, and Plan performed by medical provider. The above documentation has been reviewed and is accurate and complete. Briscoe Deutscher, D.O.  Records requested if needed. Time spent with the patient: 45 minutes, of which >50% was spent in obtaining information about her symptoms, reviewing her previous labs, evaluations, and treatments, counseling her about her  condition (please see the discussed topics above), and developing a plan to further investigate it; she had a number of  questions which I addressed. The patient has significant anxiety related to her health. She does not tolerate medications well. I took some time to explain that I recommend the Lexapro for long-term management, but would be okay with short-term Klonopin while working up her symptoms. Will repeat labs, add "autoimmune workup." Will work on strengthening. Recommended therapy. Note: the patient calls the office nearly daily to ask questions, again demonstrating her anxiety.  Briscoe Deutscher, DO , [place] 05/20/2017  Future Appointments Date Time Provider [place]  06/13/2017 11:30 AM Briscoe Deutscher, DO [facility] None

## 2017-05-17 ENCOUNTER — Telehealth: Payer: Self-pay | Admitting: Family Medicine

## 2017-05-17 LAB — ANTI-NUCLEAR AB-TITER (ANA TITER): ANA Titer 1: 1:160 {titer} — ABNORMAL HIGH

## 2017-05-17 LAB — ANA: Anti Nuclear Antibody(ANA): POSITIVE — AB

## 2017-05-17 NOTE — Telephone Encounter (Signed)
Per Mel Almond, patient will have to come in to have lab drawn.  Order has been placed.

## 2017-05-17 NOTE — Telephone Encounter (Addendum)
Spoke with patient and she stated that she is still being very weak and not able to do anything. She tried to go out with her husband, but was not able to go due to the weakness. She said that she feels worse than yesterday. She also said that Dr. Juleen China had told her that she would give her something for her rhinitis. She states that she is having sinus pain and pressure. States that the back of her head is hurting as well.  Patient stated that she is just so scared about her health. She does not know what is wrong with her. She was asking about labs and I advised that we have not received them from the doctor yet, but once we do she would be called with the results.

## 2017-05-17 NOTE — Telephone Encounter (Signed)
See lab note.  

## 2017-05-17 NOTE — Telephone Encounter (Signed)
Patient called in reference to appointment she had yesterday. Stating she had several questions regarding lab work, meds, and one other question (patient did not want to give me much info at all.) She did state she was not feeling well at all today. Please call patient and advise.

## 2017-05-18 ENCOUNTER — Other Ambulatory Visit (INDEPENDENT_AMBULATORY_CARE_PROVIDER_SITE_OTHER): Payer: PPO

## 2017-05-18 NOTE — Telephone Encounter (Signed)
Left message for patient to return call.

## 2017-05-18 NOTE — Telephone Encounter (Signed)
I advised patient per Dr. Juleen China that she would need to call ENT about Xray of her sinus. They can also prescribe medication for her rhinitis.

## 2017-05-19 ENCOUNTER — Telehealth: Payer: Self-pay | Admitting: Family Medicine

## 2017-05-19 DIAGNOSIS — F419 Anxiety disorder, unspecified: Secondary | ICD-10-CM

## 2017-05-19 LAB — PTH, INTACT AND CALCIUM
Calcium: 10.4 mg/dL (ref 8.6–10.4)
PTH: 48 pg/mL (ref 14–64)

## 2017-05-19 MED ORDER — ALPRAZOLAM 0.25 MG PO TABS: 0.2500 mg | ORAL_TABLET | Freq: Two times a day (BID) | ORAL | 0 refills | Status: DC | PRN

## 2017-05-19 NOTE — Telephone Encounter (Signed)
Spoke with patient and she stated that the Klonopin has made her feel worse. She stated that the GI doctor has prescribed Xanax 0.25 mg and she has been taking that in the AM and the Klonopin in the evening. She stated that the klonopin makes her throat swell a little and makes her stomach hurt. She stated that the xanax makes her feel better and she can tolerate it. Patient said that she only has 1 tablet left. Wanting a refill on the xanax.

## 2017-05-19 NOTE — Telephone Encounter (Signed)
Notified patient of message and referral. Patient verbalized understanding. Faxed RX to pharmacy.

## 2017-05-19 NOTE — Telephone Encounter (Signed)
I refilled the Xanax. As I don't like refilling the specific medication, I went ahead and put in referral for psychiatry. I am hoping that the patient is able to see psychiatry before he needs to be refilled.

## 2017-05-19 NOTE — Telephone Encounter (Signed)
Noted  

## 2017-05-19 NOTE — Telephone Encounter (Signed)
Patient called in reference to medication Klonopin. Patient stated she wanted to speak with someone about this because it is "horrible". Please call patient and advise.

## 2017-05-19 NOTE — Telephone Encounter (Signed)
Patient called in reference to referral to Rheumatology. Informed her that the referral was placed and [facility] will be in contact with her.

## 2017-05-20 DIAGNOSIS — J300 Vasomotor rhinitis: Secondary | ICD-10-CM | POA: Insufficient documentation

## 2017-05-20 DIAGNOSIS — R768 Other specified abnormal immunological findings in serum: Secondary | ICD-10-CM | POA: Insufficient documentation

## 2017-05-20 DIAGNOSIS — R1013 Epigastric pain: Secondary | ICD-10-CM | POA: Insufficient documentation

## 2017-05-20 DIAGNOSIS — N3010 Interstitial cystitis (chronic) without hematuria: Secondary | ICD-10-CM | POA: Insufficient documentation

## 2017-05-22 ENCOUNTER — Telehealth: Payer: Self-pay | Admitting: Physician Assistant

## 2017-05-23 ENCOUNTER — Ambulatory Visit: Payer: PPO

## 2017-05-23 ENCOUNTER — Ambulatory Visit (INDEPENDENT_AMBULATORY_CARE_PROVIDER_SITE_OTHER): Payer: PPO | Admitting: Family Medicine

## 2017-05-23 VITALS — BP 118/70 | HR 102 | Temp 98.0°F | Ht 62.0 in | Wt 150.4 lb

## 2017-05-23 DIAGNOSIS — R7989 Other specified abnormal findings of blood chemistry: Secondary | ICD-10-CM

## 2017-05-23 DIAGNOSIS — R252 Cramp and spasm: Secondary | ICD-10-CM

## 2017-05-23 DIAGNOSIS — E559 Vitamin D deficiency, unspecified: Secondary | ICD-10-CM

## 2017-05-23 LAB — COMPREHENSIVE METABOLIC PANEL
ALT: 11 U/L (ref 0–35)
AST: 13 U/L (ref 0–37)
Albumin: 4.7 g/dL (ref 3.5–5.2)
Alkaline Phosphatase: 97 U/L (ref 39–117)
BUN: 17 mg/dL (ref 6–23)
CO2: 30 mEq/L (ref 19–32)
Calcium: 10.4 mg/dL (ref 8.4–10.5)
Chloride: 101 mEq/L (ref 96–112)
Creatinine, Ser: 0.93 mg/dL (ref 0.40–1.20)
GFR: 64.21 mL/min (ref >60.00)
Glucose, Bld: 128 mg/dL — ABNORMAL HIGH (ref 70–99)
Potassium: 3.7 mEq/L (ref 3.5–5.1)
Sodium: 139 mEq/L (ref 135–145)
Total Bilirubin: 0.4 mg/dL (ref 0.2–1.2)
Total Protein: 7.9 g/dL (ref 6.0–8.3)

## 2017-05-23 LAB — VITAMIN D 25 HYDROXY (VIT D DEFICIENCY, FRACTURES): VITD: 35.84 ng/mL (ref 30.00–100.00)

## 2017-05-23 NOTE — Telephone Encounter (Signed)
Noted  

## 2017-05-23 NOTE — Progress Notes (Addendum)
Natalie Erickson is a 66 y.o. female here for an acute visit.  History of Present Illness:   Natalie Erickson CMA acting as scribe for Dr. Juleen China.  HPI Patients comes in today due to having leg and hand cramping that started about a week ago. She has been have having numbness and tingling in the hands that was going on previously. She spoke with her GI doctor yesterday and they started her on magnesium mg daily. Patient states that this has helped some.    PMHx, SurgHx, SocialHx, Medications, and Allergies were reviewed in the Visit Navigator and updated as appropriate.  Current Medications:   .  ALPRAZolam (XANAX) 0.25 MG tablet, Take 1 tablet (0.25 mg total) by mouth 2 (two) times daily as needed for anxiety., Disp: 40 tablet, Rfl: 0 .  Cholecalciferol (VITAMIN D) 2000 units tablet, Take by mouth., Disp: , Rfl:  .  omega-3 acid ethyl esters (LOVAZA) 1 g capsule, Take 1 g by mouth daily., Disp: , Rfl:  .  ondansetron (ZOFRAN ODT) 4 MG disintegrating tablet, Take 1 tablet (4 mg total) by mouth every 8 (eight) hours as needed for nausea or vomiting., Disp: 20 tablet, Rfl: 0 .  phenazopyridine (PYRIDIUM) 100 MG tablet, Take 1 tablet (100 mg total) by mouth 3 (three) times daily as needed for pain., Disp: 10 tablet, Rfl: 0   Allergies  Allergen Reactions  . Statins Tinitus    Achy joints, muscle aches  . Banana Other (See Comments)    migraine  . Chocolate Other (See Comments)    migraine  . Codeine Nausea And Vomiting  . Cranberry Other (See Comments)    Cystitis  . Latex Rash  . Monosodium Glutamate Other (See Comments)    Migraine  . Penicillins Rash    Has patient had a PCN reaction causing immediate rash, facial/tongue/throat swelling, SOB or lightheadedness with hypotension: Yes Has patient had a PCN reaction causing severe rash involving mucus membranes or skin necrosis: No Has patient had a PCN reaction that required hospitalization No Has patient had a PCN reaction occurring  within the last 10 years: No If all of the above answers are "NO", then may proceed with Cephalosporin use.   Marland Kitchen Antihistamines, Loratadine-Type     Throat swelling  . Montelukast Other (See Comments)    Throat swelling  . Sulfites Other (See Comments)    Nausea and headaches  . Azithromycin Other (See Comments) and Diarrhea   Review of Systems:   Review of Systems  Constitutional: Positive for malaise/fatigue. Negative for chills and fever.  HENT: Positive for sinus pain. Negative for ear pain and sore throat.   Eyes: Positive for blurred vision. Negative for double vision.  Respiratory: Negative for cough, shortness of breath and wheezing.   Cardiovascular: Negative for chest pain, palpitations and leg swelling.  Gastrointestinal: Positive for diarrhea and nausea. Negative for vomiting.  Musculoskeletal: Positive for back pain, joint pain and neck pain.  Neurological: Positive for headaches. Negative for dizziness.  Psychiatric/Behavioral: Positive for depression. Negative for hallucinations and memory loss.   Vitals:   Vitals:   05/23/17 0957  BP: 118/70  Pulse: (!) 102  Temp: 98 F (36.7 C)  TempSrc: Oral  SpO2: 98%  Weight: 150 lb 6.4 oz (68.2 kg)  Height: 5\' 2"  (1.575 m)     Body mass index is 27.51 kg/m.  Physical Exam:   Physical Exam  Constitutional: She appears well-nourished.  HENT:  Head: Normocephalic and atraumatic.  Eyes:  EOM are normal. Pupils are equal, round, and reactive to light.  Neck: Normal range of motion. Neck supple.  Cardiovascular: Normal rate, regular rhythm, normal heart sounds and intact distal pulses.   Pulmonary/Chest: Effort normal.  Abdominal: Soft.  Skin: Skin is warm.  Psychiatric: She has a normal mood and affect. Her behavior is normal.  Nursing note and vitals reviewed.   Assessment and Plan:   [name] was seen today for acute visit.  Diagnoses and all orders for this visit:  Leg cramps Comments: Mild and improving  with magnesium. Previous magnesium level checked in early May was normal. Her symptoms are most consistent with myalgias. Patient does have a history of fibromyalgia. She is definitely having a flare. I recommend that she look into water exercise at the [facility]. Please see previous note for more details of ongoing workup regarding her other issues. We did touch on these issues as well today. The patient asked that her rheumatology visit be sooner than previously offered. Vitamin D added to labs for hyperparathyroid w/u. Orders: -     VITAMIN D 25 Hydroxy (Vit-D Deficiency, Fractures) -     Comprehensive metabolic panel  Low vitamin D level -     VITAMIN D 25 Hydroxy (Vit-D Deficiency, Fractures)   . Reviewed expectations re: course of current medical issues. . Discussed self-management of symptoms. . Outlined signs and symptoms indicating need for more acute intervention. . Patient verbalized understanding and all questions were answered. Marland Kitchen Health Maintenance issues including appropriate healthy diet, exercise, and smoking avoidance were discussed with patient. . See orders for this visit as documented in the electronic medical record. . Patient received an After Visit Summary.  CMA served as Education administrator during this visit. History, Physical, and Plan performed by medical provider. The above documentation has been reviewed and is accurate and complete. Briscoe Deutscher, D.O.  Briscoe Deutscher, DO [place], [place] 05/23/2017  Future Appointments Date Time Provider [place]  06/13/2017 11:30 AM Briscoe Deutscher, DO [facility] None

## 2017-05-23 NOTE — Telephone Encounter (Signed)
Patient called requesting an appt. States that she called GI over the weekend., see the attached note below. persistant cramping in hands and legs. She also states that Nicoletta Ba gave her labs to do, but I see no notes or orders in the system. Acute scheduled for 945

## 2017-05-24 ENCOUNTER — Other Ambulatory Visit: Payer: Self-pay

## 2017-05-24 ENCOUNTER — Telehealth: Payer: Self-pay | Admitting: Physician Assistant

## 2017-05-24 ENCOUNTER — Ambulatory Visit: Payer: Self-pay | Admitting: Allergy & Immunology

## 2017-05-24 DIAGNOSIS — J329 Chronic sinusitis, unspecified: Secondary | ICD-10-CM | POA: Diagnosis not present

## 2017-05-24 DIAGNOSIS — J3501 Chronic tonsillitis: Secondary | ICD-10-CM | POA: Diagnosis not present

## 2017-05-24 MED ORDER — ONDANSETRON 4 MG PO TBDP: 4.0000 mg | ORAL_TABLET | Freq: Three times a day (TID) | ORAL | 0 refills | Status: DC | PRN

## 2017-05-24 NOTE — Telephone Encounter (Signed)
Patient feels the Zofran 4 mg is strong and responsible for causing muscle cramps. Please also see the encounter from a visit with her primary provider 05/23/17. She requests the pharmacy be contacted for orally dissolving Zofran. She thinks a prior authorization will be needed. She has not taken Zofran today and states other than her nausea, she feels better.

## 2017-05-24 NOTE — Telephone Encounter (Signed)
  Not sure the Zofran is causing muscle crmps, not a common reaction - her PCP 's note states they feel she is having flare of fibromyalgia... Happy to give her Zofran 4 mg ODT  To use q 6 hours prn.. She could break in half also and see if that works

## 2017-05-24 NOTE — Telephone Encounter (Signed)
Pharmacy notified. Waiting to see if it is covered.

## 2017-05-25 ENCOUNTER — Telehealth: Payer: Self-pay | Admitting: Family Medicine

## 2017-05-25 DIAGNOSIS — M9902 Segmental and somatic dysfunction of thoracic region: Secondary | ICD-10-CM | POA: Diagnosis not present

## 2017-05-25 DIAGNOSIS — M9901 Segmental and somatic dysfunction of cervical region: Secondary | ICD-10-CM | POA: Diagnosis not present

## 2017-05-25 NOTE — Telephone Encounter (Signed)
Left message for patient to return call to give appointment date for rheumatology. It is [facility] on May 30, 2017.

## 2017-05-25 NOTE — Telephone Encounter (Signed)
Patient is suspecting that she is having a reaction to zofran. She is experiencing pain in the hands and feet along with night sweats.  She declined triage service. States that she can wait for a CMA to call.   Verified cell #

## 2017-05-25 NOTE — Telephone Encounter (Signed)
Spoke with patient and she stated that she started having palpitations last night with discomfort under her rib cage. She has started having night sweats and foot pain that was so bad that she can barely walk on them. I went over the symptoms with Dr. Juleen China and she wants patient to go to ED for further evaluation. Patient stated that she did not want to go to ED so she would call GI back. Dr. Juleen China informed.  Patient also gave me a new Rheumatology information for another referral. Information was given to [facility] to work on referral.

## 2017-05-25 NOTE — Telephone Encounter (Signed)
Discussed with Roselyn Reef. To ER for further evaluation. EW

## 2017-05-26 ENCOUNTER — Telehealth: Payer: Self-pay | Admitting: Gastroenterology

## 2017-05-26 NOTE — Telephone Encounter (Signed)
Agreed.  I am afraid I have not found something specific to treat.

## 2017-05-26 NOTE — Telephone Encounter (Signed)
Noted  

## 2017-05-26 NOTE — Telephone Encounter (Signed)
Patient returning phone call, I advised her of the note below. No further action.

## 2017-05-26 NOTE — Telephone Encounter (Signed)
Spoke to patient, she states that she has been feeling weak, shaky and scared. She has stopped taking her zofran and omeprazole stating that she felt she was having a reaction to them. Looking at EGD report, Dr. Loletha Carrow had asked her to contact PCP for further evaluation. I asked if she had followed up with her PCP. She said she had, and they had done further testing. She had contacted them too. I told her that if she was really feeling that badly to go to the ED for further evaluation. She stated that is what her PCP had advised too.

## 2017-05-27 ENCOUNTER — Emergency Department (HOSPITAL_COMMUNITY)
Admission: EM | Admit: 2017-05-27 | Discharge: 2017-05-27 | Disposition: A | Discharge status: Home / Self Care | Payer: PPO | Attending: Emergency Medicine | Admitting: Emergency Medicine

## 2017-05-27 ENCOUNTER — Encounter (HOSPITAL_COMMUNITY): Payer: Self-pay | Admitting: Emergency Medicine

## 2017-05-27 DIAGNOSIS — M79604 Pain in right leg: Secondary | ICD-10-CM | POA: Diagnosis not present

## 2017-05-27 DIAGNOSIS — Z91040 Latex allergy status: Secondary | ICD-10-CM | POA: Insufficient documentation

## 2017-05-27 DIAGNOSIS — Z79899 Other long term (current) drug therapy: Secondary | ICD-10-CM | POA: Insufficient documentation

## 2017-05-27 DIAGNOSIS — M79605 Pain in left leg: Secondary | ICD-10-CM | POA: Insufficient documentation

## 2017-05-27 DIAGNOSIS — M791 Myalgia, unspecified site: Secondary | ICD-10-CM

## 2017-05-27 DIAGNOSIS — R52 Pain, unspecified: Secondary | ICD-10-CM

## 2017-05-27 DIAGNOSIS — R5383 Other fatigue: Secondary | ICD-10-CM | POA: Diagnosis not present

## 2017-05-27 LAB — COMPREHENSIVE METABOLIC PANEL
ALT: 14 U/L (ref 14–54)
AST: 14 U/L — ABNORMAL LOW (ref 15–41)
Albumin: 4.4 g/dL (ref 3.5–5.0)
Alkaline Phosphatase: 108 U/L (ref 38–126)
Anion gap: 10 (ref 5–15)
BUN: 12 mg/dL (ref 6–20)
CO2: 27 mmol/L (ref 22–32)
Calcium: 9.8 mg/dL (ref 8.9–10.3)
Chloride: 103 mmol/L (ref 101–111)
Creatinine, Ser: 0.76 mg/dL (ref 0.44–1.00)
GFR calc Af Amer: >60 mL/min (ref >60)
GFR calc non Af Amer: >60 mL/min (ref >60)
Glucose, Bld: 112 mg/dL — ABNORMAL HIGH (ref 65–99)
Potassium: 3.6 mmol/L (ref 3.5–5.1)
Sodium: 140 mmol/L (ref 135–145)
Total Bilirubin: 0.5 mg/dL (ref 0.3–1.2)
Total Protein: 7.5 g/dL (ref 6.5–8.1)

## 2017-05-27 LAB — CBC WITH DIFFERENTIAL/PLATELET
Basophils Absolute: 0 10*3/uL (ref 0.0–0.1)
Basophils Relative: 0 %
Eosinophils Absolute: 0 10*3/uL (ref 0.0–0.7)
Eosinophils Relative: 0 %
HCT: 40.9 % (ref 36.0–46.0)
Hemoglobin: 13.8 g/dL (ref 12.0–15.0)
Lymphocytes Relative: 23 %
Lymphs Abs: 2.1 10*3/uL (ref 0.7–4.0)
MCH: 31.3 pg (ref 26.0–34.0)
MCHC: 33.7 g/dL (ref 30.0–36.0)
MCV: 92.7 fL (ref 78.0–100.0)
Monocytes Absolute: 0.6 10*3/uL (ref 0.1–1.0)
Monocytes Relative: 7 %
Neutro Abs: 6.4 10*3/uL (ref 1.7–7.7)
Neutrophils Relative %: 70 %
Platelets: 418 10*3/uL — ABNORMAL HIGH (ref 150–400)
RBC: 4.41 MIL/uL (ref 3.87–5.11)
RDW: 12.2 % (ref 11.5–15.5)
WBC: 9.1 10*3/uL (ref 4.0–10.5)

## 2017-05-27 LAB — MAGNESIUM: Magnesium: 2.2 mg/dL (ref 1.7–2.4)

## 2017-05-27 LAB — CK: Total CK: 40 U/L (ref 38–234)

## 2017-05-27 MED ORDER — SODIUM CHLORIDE 0.9 % IV BOLUS (SEPSIS)
1000.0000 mL | Freq: Once | INTRAVENOUS | Status: AC
  Administered 2017-05-27: 1000 mL via INTRAVENOUS

## 2017-05-27 MED ORDER — ACETAMINOPHEN 500 MG PO TABS
1000.0000 mg | ORAL_TABLET | Freq: Once | ORAL | Status: DC
  Filled 2017-05-27: qty 2

## 2017-05-27 MED ORDER — ACETAMINOPHEN 160 MG/5ML PO SOLN
1000.0000 mg | Freq: Once | ORAL | Status: AC
  Administered 2017-05-27: 1000 mg via ORAL
  Filled 2017-05-27: qty 40.6

## 2017-05-27 MED ORDER — CYCLOBENZAPRINE HCL 10 MG PO TABS: 10.0000 mg | ORAL_TABLET | Freq: Two times a day (BID) | ORAL | 0 refills | Status: DC | PRN

## 2017-05-27 MED ORDER — ALUM & MAG HYDROXIDE-SIMETH 200-200-20 MG/5ML PO SUSP
30.0000 mL | Freq: Once | ORAL | Status: AC
  Administered 2017-05-27: 30 mL via ORAL
  Filled 2017-05-27: qty 30

## 2017-05-27 NOTE — ED Provider Notes (Signed)
[facility] DEPT Provider Note   CSN: [id number] Arrival date & time: 05/27/17  1404     History   Chief Complaint Chief Complaint  Patient presents with  . multiple complaints    HPI Natalie Erickson is a 66 y.o. female.  HPI   Severe fatigue, cramping in both legs, extending into bilateral poster thigh and buttocks.  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  Was on zofran for nausea, stopped taking it because having tingling. Took prilosec as needed then stopped taking it.  Tingling in both legs.  Cramping in hands too. Has had these symptoms since after the endoscopy 5/16 of May. Getting worse. Stopped meds because thought these may have been contributing. Stopped zofran and prilosec.  Headache started after getting the IV. Frontal headache,  Headache is 7/10.  Nausea. No vomiting.  No fever. No urinary symptoms recently.  Diarrhea but not this morning, has off and on.  No chest pain or dyspnea.  Scheduled to see rheumatologist on Tuesday.   Past Medical History:  Diagnosis Date  . Anxiety 04/06/2017  . Dry eyes 04/06/2017  . Elevated BP without diagnosis of hypertension 04/06/2017  . Hiatal hernia   . History of colon polyps   . IBS (irritable bowel syndrome)   . Mixed hyperlipidemia 04/06/2017    Patient Active Problem List   Diagnosis Date Noted  . ANA positive 05/20/2017  . 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 Serum calcium elevated 05/20/2017  . Interstitial cystitis 05/20/2017  . Dyspepsia 05/20/2017  . Mixed hyperlipidemia 04/06/2017  . Elevated BP without diagnosis of hypertension 04/06/2017  . Anxiety 04/06/2017  . Dry eyes 04/06/2017    Past Surgical History:  Procedure Laterality Date  . APPENDECTOMY  1959  . LAPAROSCOPY      OB History    No data available       Home Medications    Prior to Admission medications   Medication Sig Start Date End Date Taking? Authorizing Provider  acetaminophen (TYLENOL) 500  MG tablet Take 1,000 mg by mouth every 6 (six) hours as needed for headache (pain).   Yes [provider]  Cholecalciferol (VITAMIN D) 2000 units tablet Take 2,000 Units by mouth daily.    Yes [provider]  Magnesium 200 MG TABS Take 200 mg by mouth 2 (two) times daily.   Yes [provider]  Omega-3 Fatty Acids (OMEGA-3 PO) Take 1 capsule by mouth daily.   Yes [provider]  Polyethyl Glyc-Propyl Glyc PF (SYSTANE ULTRA PF) 0.4-0.3 % SOLN Place 2 drops into both eyes 4 (four) times daily as needed (dry eyes).   Yes [provider]  ALPRAZolam (XANAX) 0.25 MG tablet Take 1 tablet (0.25 mg total) by mouth 2 (two) times daily as needed for anxiety. Patient not taking: Reported on 05/27/2017 05/19/17   Briscoe Deutscher, DO  cyclobenzaprine (FLEXERIL) 10 MG tablet Take 1 tablet (10 mg total) by mouth 2 (two) times daily as needed for muscle spasms. 05/27/17   Gareth Morgan, MD  ondansetron (ZOFRAN ODT) 4 MG disintegrating tablet Take 1 tablet (4 mg total) by mouth every 8 (eight) hours as needed for nausea or vomiting. Patient not taking: Reported on 05/27/2017 05/24/17   Esterwood, Amy S, PA-C  phenazopyridine (PYRIDIUM) 100 MG tablet Take 1 tablet (100 mg total) by mouth 3 (three) times daily as needed for pain. Patient not taking: Reported on 05/27/2017 05/16/17   Briscoe Deutscher, DO  Family History Family History  Problem Relation Age of Onset  . Breast cancer Sister   . Diabetes Paternal Grandmother   . Hypertension Mother   . Lung cancer Father     Social History Social History  Substance Use Topics  . Smoking status: Never Smoker  . Smokeless tobacco: Never Used  . Alcohol use No     Allergies   Statins; Banana; Chocolate; Codeine; Cranberry; Latex; Monosodium glutamate; Penicillins; Antihistamines, loratadine-type; Montelukast; Prednisone; Sulfites; Zofran [ondansetron hcl]; and Azithromycin   Review of Systems Review of Systems    Constitutional: Positive for appetite change (but forces self to eat), fatigue and unexpected weight change. Negative for fever.  HENT: Negative for sore throat.   Eyes: Negative for visual disturbance.  Respiratory: Negative for cough and shortness of breath.   Cardiovascular: Negative for chest pain.  Gastrointestinal: Negative for abdominal pain, nausea and vomiting.  Genitourinary: Negative for difficulty urinating and dysuria.  Musculoskeletal: Positive for arthralgias and back pain. Negative for neck pain.  Skin: Negative for rash.  Neurological: Positive for headaches. Negative for syncope, facial asymmetry, weakness and numbness (tingling in legs, spasms in hands and feet).     Physical Exam Updated Vital Signs BP 103/72 (BP Location: Right Arm)   Pulse 96   Temp 98.2 F (36.8 C) (Oral)   Resp (!) 22   Ht 5\' 2"  (1.575 m)   Wt 68 kg (150 lb)   SpO2 99%   BMI 27.44 kg/m   Physical Exam  Constitutional: She is oriented to person, place, and time. She appears well-developed and well-nourished. No distress.  HENT:  Head: Normocephalic and atraumatic.  Eyes: Conjunctivae and EOM are normal.  Neck: Normal range of motion.  Cardiovascular: Normal rate, regular rhythm, normal heart sounds and intact distal pulses.  Exam reveals no gallop and no friction rub.   No murmur heard. Pulmonary/Chest: Effort normal and breath sounds normal. No respiratory distress. She has no wheezes. She has no rales.  Abdominal: Soft. She exhibits no distension. There is no tenderness. There is no guarding.  Musculoskeletal: She exhibits tenderness (bilateral legs). She exhibits no edema.  Neurological: She is alert and oriented to person, place, and time.  Skin: Skin is warm and dry. No rash noted. She is not diaphoretic. No erythema.  Nursing note and vitals reviewed.    ED Treatments / Results  Labs (all labs ordered are listed, but only abnormal results are displayed) Labs Reviewed  CBC  WITH DIFFERENTIAL/PLATELET - Abnormal; Notable for the following:       Result Value   Platelets 418 (*)    All other components within normal limits  COMPREHENSIVE METABOLIC PANEL - Abnormal; Notable for the following:    Glucose, Bld 112 (*)    AST 14 (*)    All other components within normal limits  CK  MAGNESIUM    EKG  EKG Interpretation None       Radiology No results found.  Procedures Procedures (including critical care time)  Medications Ordered in ED Medications  sodium chloride 0.9 % bolus 1,000 mL (0 mLs Intravenous Stopped 05/27/17 1913)  acetaminophen (TYLENOL) solution 1,000 mg (1,000 mg Oral Given 05/27/17 1907)  alum & mag hydroxide-simeth (MAALOX/MYLANTA) 200-200-20 MG/5ML suspension 30 mL (30 mLs Oral Given 05/27/17 1910)     Initial Impression / Assessment and Plan / ED Course  I have reviewed the triage vital signs and the nursing notes.  Pertinent labs & imaging results that were available  during my care of the patient were reviewed by me and considered in my medical decision making (see chart for details).     66 year old female with a history of hypertension, hyperlipidemia, anxiety, presents with concern for 3 weeks of severe fatigue, myalgias and generalized weakness. She seen her primary care physician for this multiple times, and is scheduled to see a rheumatologist on Tuesday. Labs done in the emergency department showed no sign of anemia, no significant electrolyte abnormalities. CK was done and was within normal limits. Patient is afebrile, with normal vital signs, and no symptoms to suggest infection. She denies chest pain or shortness of breath, and doubt cardiac etiology of these continuing symptoms. She has no midline back tenderness, symptoms or signs of cauda equina. Headache developed after sitting in the ED, no neurologic symptoms, not thunderclap, doubt SAH.   Discussed with patient I recommended continued close follow-up with her primary  care physician as well as rheumatologist regarding her symptoms. Offered prescription for muscle relaxant, but she declines. Patient discharged in stable condition with understanding of reasons to return.   Final Clinical Impressions(s) / ED Diagnoses   Final diagnoses:  Body aches  Myalgia  Other fatigue    New Prescriptions Discharge Medication List as of 05/27/2017  7:04 PM    START taking these medications   Details  cyclobenzaprine (FLEXERIL) 10 MG tablet Take 1 tablet (10 mg total) by mouth 2 (two) times daily as needed for muscle spasms., Starting Sat 05/27/2017, Print         Gareth Morgan, MD 05/28/17 0230

## 2017-05-27 NOTE — ED Triage Notes (Signed)
Pt also states that she is having a hard time sleeping since march-- has been to the allergist- "basal motor rhinitis" but states is unable to take anything for it-- "everything I take makes my throat swell" Dr gave pt xanax -- and pt states that they cause pt to have funny sensation in throat and fingers and muscle cramps.

## 2017-05-27 NOTE — ED Notes (Addendum)
Pt states IV is "dripping too fast." RN explained to pt that fluids were ordered as a bolus; pt request bolus be "slowed down." Drip rate slowed down per pt request.

## 2017-05-27 NOTE — ED Triage Notes (Signed)
Pt had endoscopy 2 weeks ago-- states she is having ongoing fatigue, leg cramps, stopped taking zofran, has a sore throat, post nasal drip-- has seen an ENT dr on Tuesday and is supposed to get CT scan scheduled. Has a rheumatologist appt on Tuesday.

## 2017-05-27 NOTE — ED Notes (Addendum)
RN explains to pt that she is up for discharge so she will be rolled into hallway for her NS bolus to finish infusing. Pt states "disconnect the IV, I don't wanna be rushed and put into the hallway." RN stopping IV fluids for discharge.

## 2017-05-29 ENCOUNTER — Ambulatory Visit (INDEPENDENT_AMBULATORY_CARE_PROVIDER_SITE_OTHER): Payer: PPO | Admitting: Physician Assistant

## 2017-05-29 ENCOUNTER — Encounter: Payer: Self-pay | Admitting: Physician Assistant

## 2017-05-29 ENCOUNTER — Telehealth: Payer: Self-pay | Admitting: Gastroenterology

## 2017-05-29 VITALS — BP 138/80 | HR 92 | Ht 62.0 in | Wt 148.5 lb

## 2017-05-29 DIAGNOSIS — R630 Anorexia: Secondary | ICD-10-CM | POA: Diagnosis not present

## 2017-05-29 DIAGNOSIS — R1084 Generalized abdominal pain: Secondary | ICD-10-CM | POA: Diagnosis not present

## 2017-05-29 DIAGNOSIS — K589 Irritable bowel syndrome without diarrhea: Secondary | ICD-10-CM | POA: Diagnosis not present

## 2017-05-29 MED ORDER — RANITIDINE HCL 150 MG PO TABS: 150.0000 mg | ORAL_TABLET | Freq: Two times a day (BID) | ORAL | 6 refills | Status: DC

## 2017-05-29 MED ORDER — SACCHAROMYCES BOULARDII 250 MG PO CAPS: 250.0000 mg | ORAL_CAPSULE | Freq: Two times a day (BID) | ORAL | 4 refills | Status: DC

## 2017-05-29 NOTE — Telephone Encounter (Signed)
Patient very tearful on the phone, she spoke to the doctor on call this morning at 4:00 am. Patient is now having abdominal pain, she states she is trying to eat and is now taking her medication (zofran). She also states she is having dizziness and fatigue. She has not called or followed up with her PCP as directed by the ED on 6/2. She does have an appointment with the Rheumatologist tomorrow. Patient did not want to wait to see Dr. Loletha Carrow on Wednesday, insisting that the doctor on call told her that we would see about getting her an appointment today. I did work her in with an APP today at 3:00.

## 2017-05-29 NOTE — Patient Instructions (Addendum)
We have provided you with 2 lab orders to take to [place].  Go in the morning to have these tests drawn. ( Morning hours is when the one test needs drawn).   We have given you IBGard samples. Take 2 twice daily.  Take Gas X as needed.   We have sent the following medications to your pharmacy for you to pick up at your convenience: [facility].  1. Zantac 150 mg.  2. Florastor 250 mg.

## 2017-05-29 NOTE — Progress Notes (Signed)
Subjective:    Patient ID: Natalie Erickson, female    DOB: 1951-10-14, 66 y.o.   MRN: [id number]  HPI Natalie Erickson is a pleasant 66 year old white female, recently established with GI, and known to Natalie Erickson and myself. Her PCP is Dr. Wendie Erickson. Patient has history of interstitial cystitis, fibromyalgia, anxiety. She was initially seen in our office on 05/11/2017 with complaints of acute abdominal pain and nausea onset after she had taken a course of antibiotics/doxycycline and a steroid Dosepak for sinusitis. She had an ER visit on 05/03/2017 had CT of the abdomen and pelvis which did show multiple kidney stones in the left kidney and a 9 mm lesion in the right kidney for which MRI was recommended. Labs were unremarkable. She then had abdominal ultrasound on 5:15 which was negative. When seen in our office he was concerned that she was bleeding.. Stool was heme positive but not melenic. CBC was repeated and hemoglobin was 13.6. She underwent EGD the following day per Dr. Rachael Erickson which was completely normal. Since that time she's continued to have problems with nausea, lack of appetite, weight loss, and intermittent abdominal cramping and pain. She also describes severe fatigue, weakness, and has had cramping in her legs and hands periodically. She feels that she does not tolerate medications well. She could not tolerate Protonix because of abdominal cramping and headache. She was placed on omeprazole she says also causes cramping. She has been taking Zofran very sporadically. Her husband seems frustrated with her lack of progress. She had another ER visit this past weekend with complaints of abdominal pain. No further imaging was done. CBC, CK, magnesium and CMET were unremarkable. She did have an elevated calcium level within the past month however had been taking calcium supplementation. PTH level was normal by her PCP. Also has had recent positive ANA at 1-160 and rheumatology evaluation is scheduled for  later this week.  Review of Systems Pertinent positive and negative review of systems were noted in the above HPI section.  All other review of systems was otherwise negative.  Outpatient Encounter Prescriptions as of 05/29/2017  Medication Sig  . acetaminophen (TYLENOL) 500 MG tablet Take 1,000 mg by mouth every 6 (six) hours as needed for headache (pain).  . Cholecalciferol (VITAMIN D) 2000 units tablet Take 2,000 Units by mouth daily.   . Magnesium 200 MG TABS Take 200 mg by mouth 2 (two) times daily.  . Omega-3 Fatty Acids (OMEGA-3 PO) Take 1 capsule by mouth daily.  . ondansetron (ZOFRAN ODT) 4 MG disintegrating tablet Take 1 tablet (4 mg total) by mouth every 8 (eight) hours as needed for nausea or vomiting.  Natalie Erickson Glycol-Propyl Glycol (SYSTANE ULTRA OP) Apply to eye 4 (four) times daily.  . ranitidine (ZANTAC) 150 MG tablet Take 1 tablet (150 mg total) by mouth 2 (two) times daily.  Marland Kitchen saccharomyces boulardii (FLORASTOR) 250 MG capsule Take 1 capsule (250 mg total) by mouth 2 (two) times daily.  . [DISCONTINUED] ALPRAZolam (XANAX) 0.25 MG tablet Take 1 tablet (0.25 mg total) by mouth 2 (two) times daily as needed for anxiety. (Patient not taking: Reported on 05/27/2017)  . [DISCONTINUED] cyclobenzaprine (FLEXERIL) 10 MG tablet Take 1 tablet (10 mg total) by mouth 2 (two) times daily as needed for muscle spasms.  . [DISCONTINUED] phenazopyridine (PYRIDIUM) 100 MG tablet Take 1 tablet (100 mg total) by mouth 3 (three) times daily as needed for pain. (Patient not taking: Reported on 05/27/2017)  . [DISCONTINUED] Polyethyl Glyc-Propyl Glyc  PF (SYSTANE ULTRA PF) 0.4-0.3 % SOLN Place 2 drops into both eyes 4 (four) times daily as needed (dry eyes).   No facility-administered encounter medications on file as of 05/29/2017.    Allergies  Allergen Reactions  . Statins Tinitus    Achy joints, muscle aches  . Banana Other (See Comments)    migraine  . Chocolate Other (See Comments)    migraine    . Codeine Nausea And Vomiting  . Cranberry Other (See Comments)    Cystitis  . Latex Rash  . Monosodium Glutamate Other (See Comments)    Migraine  . Penicillins Rash    Has patient had a PCN reaction causing immediate rash, facial/tongue/throat swelling, SOB or lightheadedness with hypotension: Yes Has patient had a PCN reaction causing severe rash involving mucus membranes or skin necrosis: No Has patient had a PCN reaction that required hospitalization No Has patient had a PCN reaction occurring within the last 10 years: No If all of the above answers are "NO", then may proceed with Cephalosporin use.   Marland Kitchen Antihistamines, Loratadine-Type     Throat swelling  . Montelukast Other (See Comments)    Throat swelling  . Prednisone Swelling    Throat swelling (no difficulty breathing)  . Sulfites Other (See Comments)    Nausea and headaches  . Zofran [Ondansetron Hcl] Other (See Comments)    Muscle cramps, leg tingling  . Azithromycin Other (See Comments) and Diarrhea   Patient Active Problem List   Diagnosis Date Noted  . ANA positive 05/20/2017  . 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 Serum calcium elevated 05/20/2017  . Interstitial cystitis 05/20/2017  . Dyspepsia 05/20/2017  . Mixed hyperlipidemia 04/06/2017  . Elevated BP without diagnosis of hypertension 04/06/2017  . Anxiety 04/06/2017  . Dry eyes 04/06/2017   Social History   Social History  . Marital status: Married    Spouse name: N/A  . Number of children: 2  . Years of education: N/A   Occupational History  . retired    Social History Main Topics  . Smoking status: Never Smoker  . Smokeless tobacco: Never Used  . Alcohol use No  . Drug use: No  . Sexual activity: Yes   Other Topics Concern  . Not on file   Social History Narrative  . No narrative on file    Natalie Erickson's family history includes Breast cancer in her sister; Diabetes in her paternal grandmother; Hypertension in her mother; Lung cancer in  her father.      Objective:    Vitals:   05/29/17 1517  BP: 138/80  Pulse: 92    Physical Exam Well-developed older white female in no acute distress, anxious appearing accompanied by her husband, Blood pressure 130/80 pulse 92 height 5 foot 2, weight 148, BMI 27.1. HEENT; nontraumatic normocephalic EOMI PERRLA sclera anicteric, Cardiovascular ;regular rate and rhythm with S1-S2 no murmur or gallop, Pulmonary ;clear bilaterally, Abdomen; soft, she has some mouth rather generalized tenderness no guarding or rebound no palpable mass or hepatosplenomegaly bowel sounds present, Rectal ;exam not done, Extremities; no clubbing cyanosis or edema skin warm and dry, Neuropsych; mood and affect appropriate, she is anxious      Assessment & Plan:   #37 66 year old white female with 1 month history of abdominal pain, cramping nausea, weight loss and inability to eat. This has been associated with fatigue and intermittent muscle cramps. Fairly extensive GI workup to date unrevealing as to cause with negative ultrasound, CT and EGD.  Patient had colonoscopy done in 2016 [facility] - She did have polyps I suspect a lot of her GI symptoms are related to IBS, she also clearly has significant anxiety. She feels that the anxiety is brought on by her physical symptoms and is not the underlying cause. Certainly she could have a more systemic process contributing to GI symptoms and other constitutional symptoms. With positive ANA will need to rule out lupus or other underlying autoimmune disorder.  #2 history of interstitial cystitis #3 history of fibromyalgia  Plan; Repeat serum calcium, check an a.m. cortisol level Await rheumatology evaluation Stop PPIs says she is not tolerating well Start Zantac 150 mg by mouth twice a day Add IBgard 2 by mouth twice a day Gas-X or Phazyme when necessary and asked her to take a dose before bedtime Add Florastor one by mouth every morning Have asked her to call back  with progress report in 1-2 weeks.  Amy S Esterwood PA-C 05/29/2017   Cc: Briscoe Deutscher, DO

## 2017-05-30 ENCOUNTER — Telehealth: Payer: Self-pay | Admitting: Family Medicine

## 2017-05-30 DIAGNOSIS — R5383 Other fatigue: Secondary | ICD-10-CM | POA: Diagnosis not present

## 2017-05-30 DIAGNOSIS — M359 Systemic involvement of connective tissue, unspecified: Secondary | ICD-10-CM | POA: Diagnosis not present

## 2017-05-30 DIAGNOSIS — R768 Other specified abnormal immunological findings in serum: Secondary | ICD-10-CM | POA: Diagnosis not present

## 2017-05-30 DIAGNOSIS — N390 Urinary tract infection, site not specified: Secondary | ICD-10-CM | POA: Diagnosis not present

## 2017-05-30 DIAGNOSIS — M791 Myalgia: Secondary | ICD-10-CM | POA: Diagnosis not present

## 2017-05-30 DIAGNOSIS — M2550 Pain in unspecified joint: Secondary | ICD-10-CM | POA: Diagnosis not present

## 2017-05-30 NOTE — Telephone Encounter (Signed)
[facility] called to ask what the ANA was for the patient, I spoke to [place] who stated that [facility] should be able to help with this question. Markie searched through and found the ANA titer 1 was 1:160. I advised [facility] of this information. No further action needed.

## 2017-05-30 NOTE — Progress Notes (Signed)
Thank you for sending this case to me. I have reviewed the entire note, and the outlined plan seems appropriate.  I am familiar with her case from recent endoscopy and our discussions. I still feel that this entire scenario is most consistent with functional abdominal pain. The significance of this ANA lab is unclear.  Rheumatology will likely obtain an ESR.  If it is markedly elevated, then a vasculitis will have to be considered.  Otherwise, a trial of TCA or SNRI can be tried with caution since she is sensitive to med side effects.  Then she would need evaluation at an academic medical center since extensive workup thus far unrevealing and she continues to complain of pain with repeat ED visits.  Wilfrid Lund, MD

## 2017-05-30 NOTE — Telephone Encounter (Signed)
Natalie Erickson called to see if the patient has a lab appointment tomorrow, I advised that she does not and told them that her next appointment is 6/19 with Dr. Juleen China. No further action required.

## 2017-05-31 ENCOUNTER — Telehealth: Payer: Self-pay | Admitting: Family Medicine

## 2017-05-31 ENCOUNTER — Other Ambulatory Visit (INDEPENDENT_AMBULATORY_CARE_PROVIDER_SITE_OTHER): Payer: PPO

## 2017-05-31 DIAGNOSIS — K589 Irritable bowel syndrome without diarrhea: Secondary | ICD-10-CM

## 2017-05-31 DIAGNOSIS — R1084 Generalized abdominal pain: Secondary | ICD-10-CM

## 2017-05-31 DIAGNOSIS — R630 Anorexia: Secondary | ICD-10-CM

## 2017-05-31 LAB — CALCIUM: Calcium: 10.5 mg/dL (ref 8.4–10.5)

## 2017-05-31 NOTE — Telephone Encounter (Signed)
Patient would like to be scheduled for physical therapy. Please alter the referral status from cancelled (patient had other things going on) to ready for scheduling and call to schedule patient.

## 2017-06-01 ENCOUNTER — Ambulatory Visit (INDEPENDENT_AMBULATORY_CARE_PROVIDER_SITE_OTHER): Payer: PPO | Admitting: Family Medicine

## 2017-06-01 ENCOUNTER — Encounter: Payer: Self-pay | Admitting: Family Medicine

## 2017-06-01 VITALS — BP 150/88 | HR 100 | Temp 98.3°F | Ht 62.0 in | Wt 147.2 lb

## 2017-06-01 DIAGNOSIS — R5382 Chronic fatigue, unspecified: Secondary | ICD-10-CM | POA: Diagnosis not present

## 2017-06-01 DIAGNOSIS — F321 Major depressive disorder, single episode, moderate: Secondary | ICD-10-CM | POA: Diagnosis not present

## 2017-06-01 DIAGNOSIS — Z789 Other specified health status: Secondary | ICD-10-CM

## 2017-06-01 DIAGNOSIS — M797 Fibromyalgia: Secondary | ICD-10-CM | POA: Diagnosis not present

## 2017-06-01 DIAGNOSIS — R768 Other specified abnormal immunological findings in serum: Secondary | ICD-10-CM

## 2017-06-01 LAB — CORTISOL-AM, BLOOD: Cortisol - AM: 21.9 ug/dL

## 2017-06-01 NOTE — Progress Notes (Signed)
Natalie Erickson is a 66 y.o. female here for an acute visit.  History of Present Illness:   Natalie Erickson CMA acting as scribe for Dr. Juleen China.  HPI Patient comes in today for fatigue that has become a chronic issue. She is also having a hard time sleeping at night. I heard words "I just feel horrible". She has stopped taking the xanax and never took the flexeril that the hospital prescribed.   SEE AP FOR PROBLEM BASED CHARTING.  PMHx, SurgHx, SocialHx, Medications, and Allergies were reviewed in the Visit Navigator and updated as appropriate.  Current Medications:   .  acetaminophen (TYLENOL) 500 MG tablet, Take 1,000 mg by mouth every 6 (six) hours as needed for headache (pain)., Disp: , Rfl:  .  Cholecalciferol (VITAMIN D) 2000 units tablet, Take 2,000 Units by mouth daily. , Disp: , Rfl:  .  Magnesium 200 MG TABS, Take 200 mg by mouth 2 (two) times daily., Disp: , Rfl:  .  Omega-3 Fatty Acids (OMEGA-3 PO), Take 1 capsule by mouth daily., Disp: , Rfl:  .  Polyethyl Glycol-Propyl Glycol (SYSTANE ULTRA OP), Apply to eye 4 (four) times daily., Disp: , Rfl:  .  ranitidine (ZANTAC) 150 MG tablet, Take 1 tablet (150 mg total) by mouth 2 (two) times daily. (Patient not taking: Reported on 06/01/2017), Disp: 60 tablet, Rfl: 6 .  saccharomyces boulardii (FLORASTOR) 250 MG capsule, Take 1 capsule (250 mg total) by mouth 2 (two) times daily. (Patient not taking: Reported on 06/01/2017), Disp: 60 capsule, Rfl: 4   Allergies  Allergen Reactions  . Statins Tinitus    Achy joints, muscle aches  . Banana Other (See Comments)    migraine  . Chocolate Other (See Comments)    migraine  . Codeine Nausea And Vomiting  . Cranberry Other (See Comments)    Cystitis  . Latex Rash  . Monosodium Glutamate Other (See Comments)    Migraine  . Penicillins Rash    Has patient had a PCN reaction causing immediate rash, facial/tongue/throat swelling, SOB or lightheadedness with hypotension: Yes Has patient had a  PCN reaction causing severe rash involving mucus membranes or skin necrosis: No Has patient had a PCN reaction that required hospitalization No Has patient had a PCN reaction occurring within the last 10 years: No If all of the above answers are "NO", then may proceed with Cephalosporin use.   Marland Kitchen Antihistamines, Loratadine-Type     Throat swelling  . Montelukast Other (See Comments)    Throat swelling  . Prednisone Swelling    Throat swelling (no difficulty breathing)  . Sulfites Other (See Comments)    Nausea and headaches  . Zofran [Ondansetron Hcl] Other (See Comments)    Muscle cramps, leg tingling  . Azithromycin Other (See Comments) and Diarrhea   Review of Systems:   Review of Systems  Constitutional: Positive for malaise/fatigue. Negative for chills and fever.  HENT: Negative for ear pain, sinus pain and sore throat.   Eyes: Negative for blurred vision and double vision.  Respiratory: Negative for cough, shortness of breath and wheezing.   Cardiovascular: Positive for palpitations. Negative for chest pain and leg swelling.  Gastrointestinal: Positive for abdominal pain and nausea. Negative for vomiting.  Musculoskeletal: Positive for back pain, joint pain and neck pain.       Chronic.   Neurological: Positive for dizziness and headaches.  Psychiatric/Behavioral: Positive for depression. Negative for hallucinations and memory loss.    Vitals:   Vitals:  06/01/17 0858  BP: (!) 150/88  Pulse: 100  Temp: 98.3 F (36.8 C)  TempSrc: Oral  SpO2: 98%  Weight: 147 lb 3.2 oz (66.8 kg)  Height: 5\' 2"  (1.575 m)     Body mass index is 26.92 kg/m.  Physical Exam:   Physical Exam  Constitutional: She appears well-developed and well-nourished. No distress.  HENT:  Head: Normocephalic and atraumatic.  Eyes: EOM are normal. Pupils are equal, round, and reactive to light.  Neck: Normal range of motion. Neck supple.  Cardiovascular: Normal rate, regular rhythm, normal  heart sounds and intact distal pulses.   Pulmonary/Chest: Effort normal.  Abdominal: Soft.  Skin: Skin is warm.  Psychiatric: She is withdrawn. She exhibits a depressed mood.  Nursing note and vitals reviewed.   Assessment and Plan:   Rowyn was seen today for fatigue. She has had an extensive work-up at this point. She is intolerant to treatment with medications. Her depression is definitely a driving force here. She is not working on any of the lifestyle changes necessary to get better. She is sleeping through most of the day. She canceled PT. She feels that she is "too weak" to do the water aerobics class at the [facility]. I had a frank discussion with her today re: the fact that even if we find a diagnosis (such as something on the autoimmune spectrum), it will require her to change her habits significantly to get better. I asked Dr. Melburn Hake to meet her and work her into the schedule.   Diagnoses and all orders for this visit:  Chronic fatigue Comments: Worsening. THe patient feels weak. She is sleeping for most of the day.   Serum calcium elevated Comments: Slightly elevated calcium on several occasions. Makes me feel that we must have definitive rule out of hyperparathyroidism - though I doubt. Orders: -     Ambulatory referral to Endocrinology  ANA positive Comments: Waiting on labs by Rheumatology.   Fibromyalgia Comments: Definitely in the midst of a "flare."  Depression, major, single episode, moderate ([place]) Comments: Driving her issues. I spoke with our LCSW, Dr. Trey Paula. The patient was worked into her schedule.  Medication intolerance Comments: The patient seems to be intolerant to most medications prescribed. Her symptoms are usually "throat swelling." She DOES tolerate supplements. She is also much more interested Integrative Medicine. I let her know about [facility] in [place].     Marland Kitchen Reviewed expectations re: course of current medical  issues. . Discussed self-management of symptoms. . Outlined signs and symptoms indicating need for more acute intervention. . Patient verbalized understanding and all questions were answered. Marland Kitchen Health Maintenance issues including appropriate healthy diet, exercise, and smoking avoidance were discussed with patient. . See orders for this visit as documented in the electronic medical record. . Patient received an After Visit Summary.  CMA served as Education administrator during this visit. History, Physical, and Plan performed by medical provider. The above documentation has been reviewed and is accurate and complete. Briscoe Deutscher, D.O.  Briscoe Deutscher, DO [place] [place] 06/04/2017  Future Appointments Date Time Provider [place]  06/07/2017 10:15 AM [facility] THERAPIST A [facility] None  06/13/2017 11:30 AM Briscoe Deutscher, DO [facility] None

## 2017-06-02 ENCOUNTER — Encounter: Payer: Self-pay | Admitting: Physician Assistant

## 2017-06-02 ENCOUNTER — Ambulatory Visit (INDEPENDENT_AMBULATORY_CARE_PROVIDER_SITE_OTHER): Payer: PPO | Admitting: Physician Assistant

## 2017-06-02 ENCOUNTER — Ambulatory Visit (INDEPENDENT_AMBULATORY_CARE_PROVIDER_SITE_OTHER): Payer: PPO | Admitting: Psychology

## 2017-06-02 VITALS — BP 150/90 | HR 103 | Temp 98.4°F | Ht 62.0 in | Wt 147.0 lb

## 2017-06-02 DIAGNOSIS — F321 Major depressive disorder, single episode, moderate: Secondary | ICD-10-CM | POA: Diagnosis not present

## 2017-06-02 DIAGNOSIS — R300 Dysuria: Secondary | ICD-10-CM

## 2017-06-02 DIAGNOSIS — R350 Frequency of micturition: Secondary | ICD-10-CM

## 2017-06-02 LAB — POCT URINALYSIS DIPSTICK
Bilirubin, UA: NEGATIVE
Blood, UA: 10
Glucose, UA: NEGATIVE
Ketones, UA: NEGATIVE
Nitrite, UA: NEGATIVE
Protein, UA: NEGATIVE
Spec Grav, UA: 1.01 (ref 1.010–1.025)
Urobilinogen, UA: 0.2 E.U./dL
pH, UA: 6 (ref 5.0–8.0)

## 2017-06-02 MED ORDER — NITROFURANTOIN MONOHYD MACRO 100 MG PO CAPS: 100.0000 mg | ORAL_CAPSULE | Freq: Two times a day (BID) | ORAL | 0 refills | Status: DC

## 2017-06-02 NOTE — Patient Instructions (Signed)
It was great seeing you!  Please start the antibiotic if your symptoms progress.  We will let you know about the culture results as soon as we get them.  If you develop nausea, vomiting, fever, chills, back pain -- please be re-evaluated.

## 2017-06-02 NOTE — Progress Notes (Signed)
Natalie Erickson is a 66 y.o. female here for Urinary symptoms: Dysuria and Frequency.  I acted as a Education administrator for [employer], PA-C [employer], LPN  History of Present Illness:   No chief complaint on file.   Dysuria   This is a new problem. The current episode started yesterday. The problem has been unchanged. The quality of the pain is described as burning and aching. The pain is at a severity of 6/10. The pain is moderate. There has been no fever. She is not sexually active. There is no history of pyelonephritis. Associated symptoms include frequency, nausea and urgency. Pertinent negatives include no chills, discharge, flank pain, hematuria, hesitancy or vomiting. She has tried increased fluids and acetaminophen for the symptoms. The treatment provided mild relief.    PMHx, SurgHx, SocialHx, Medications, and Allergies were reviewed in the Visit Navigator and updated as appropriate.  Current Medications:   Current Outpatient Prescriptions:  .  acetaminophen (TYLENOL) 500 MG tablet, Take 1,000 mg by mouth every 6 (six) hours as needed for headache (pain)., Disp: , Rfl:  .  Cholecalciferol (VITAMIN D) 2000 units tablet, Take 2,000 Units by mouth daily. , Disp: , Rfl:  .  Magnesium 200 MG TABS, Take 200 mg by mouth 2 (two) times daily., Disp: , Rfl:  .  Omega-3 Fatty Acids (OMEGA-3 PO), Take 1 capsule by mouth daily., Disp: , Rfl:  .  Polyethyl Glycol-Propyl Glycol (SYSTANE ULTRA OP), Apply to eye 4 (four) times daily., Disp: , Rfl:  .  nitrofurantoin, macrocrystal-monohydrate, (MACROBID) 100 MG capsule, Take 1 capsule (100 mg total) by mouth 2 (two) times daily., Disp: 10 capsule, Rfl: 0 .  ranitidine (ZANTAC) 150 MG tablet, Take 1 tablet (150 mg total) by mouth 2 (two) times daily. (Patient not taking: Reported on 06/01/2017), Disp: 60 tablet, Rfl: 6 .  saccharomyces boulardii (FLORASTOR) 250 MG capsule, Take 1 capsule (250 mg total) by mouth 2 (two) times daily. (Patient not taking:  Reported on 06/01/2017), Disp: 60 capsule, Rfl: 4   Review of Systems:   Review of Systems  Constitutional: Negative for chills.  Gastrointestinal: Positive for nausea. Negative for vomiting.  Genitourinary: Positive for dysuria, frequency and urgency. Negative for flank pain, hematuria and hesitancy.    Vitals:   Vitals:   06/02/17 1111  BP: (!) 150/90  Pulse: (!) 103  Temp: 98.4 F (36.9 C)  TempSrc: Oral  SpO2: 97%  Weight: 147 lb (66.7 kg)  Height: 5\' 2"  (1.575 m)     Body mass index is 26.89 kg/m.  Physical Exam:   Physical Exam  Constitutional: She appears well-developed. She is cooperative.  Non-toxic appearance. She does not have a sickly appearance. She does not appear ill. No distress.  Cardiovascular: Normal rate, regular rhythm, S1 normal, S2 normal, normal heart sounds and normal pulses.   No LE edema  Pulmonary/Chest: Effort normal and breath sounds normal.  Abdominal: There is tenderness in the suprapubic area. There is no CVA tenderness.  Neurological: She is alert.  Nursing note and vitals reviewed.   Results for orders placed or performed in visit on 06/02/17  POCT urinalysis dipstick  Result Value Ref Range   Color, UA Yellow    Clarity, UA Clear    Glucose, UA Negative    Bilirubin, UA Negative    Ketones, UA Negative    Spec Grav, UA 1.010 1.010 - 1.025   Blood, UA 10 Ery/uL    pH, UA 6.0 5.0 - 8.0  Protein, UA Negative    Urobilinogen, UA 0.2 0.2 or 1.0 E.U./dL   Nitrite, UA Negative    Leukocytes, UA Large (3+) (A) Negative     Assessment and Plan:    Diagnoses and all orders for this visit:  Dysuria -     POCT urinalysis dipstick -     Urine Culture  Frequency of urination -     POCT urinalysis dipstick -     Urine Culture  Other orders -     nitrofurantoin, macrocrystal-monohydrate, (MACROBID) 100 MG capsule; Take 1 capsule (100 mg total) by mouth 2 (two) times daily.   UA with evidence of leukocytes. She has not taken  any pyridium, but has some at home to treat her interstitial cystitis. She is very reluctant to take any antibiotics as she has several sensitivities. Discussed options with patient's PCP, Dr. Briscoe Deutscher. I have sent in Z[place]man for patient to take. I recommended that she start this, however she is reluctant and wants to wait on the culture. I will contact her as soon as I get the culture results. I advised patient to follow-up if she develops nausea, vomiting, back pain, blood in urine or other worrisome symptoms. I recommended taking the antibiotic if symptoms progress. Patient verbalized understanding.   . Reviewed expectations re: course of current medical issues. . Discussed self-management of symptoms. . Outlined signs and symptoms indicating need for more acute intervention. . Patient verbalized understanding and all questions were answered. . See orders for this visit as documented in the electronic medical record. . Patient received an After-Visit Summary.  CMA or LPN served as scribe during this visit. History, Physical, and Plan performed by medical provider. Documentation and orders reviewed and attested to.  Inda Coke, PA-C

## 2017-06-03 LAB — URINE CULTURE: Organism ID, Bacteria: NO GROWTH

## 2017-06-04 DIAGNOSIS — F321 Major depressive disorder, single episode, moderate: Secondary | ICD-10-CM | POA: Insufficient documentation

## 2017-06-04 DIAGNOSIS — Z789 Other specified health status: Secondary | ICD-10-CM | POA: Insufficient documentation

## 2017-06-04 DIAGNOSIS — R5382 Chronic fatigue, unspecified: Secondary | ICD-10-CM | POA: Insufficient documentation

## 2017-06-04 DIAGNOSIS — M797 Fibromyalgia: Secondary | ICD-10-CM | POA: Insufficient documentation

## 2017-06-07 ENCOUNTER — Telehealth: Payer: Self-pay | Admitting: Physician Assistant

## 2017-06-07 ENCOUNTER — Encounter (HOSPITAL_COMMUNITY): Payer: Self-pay | Admitting: Emergency Medicine

## 2017-06-07 ENCOUNTER — Ambulatory Visit: Payer: PPO

## 2017-06-07 ENCOUNTER — Telehealth: Payer: Self-pay | Admitting: Surgical

## 2017-06-07 ENCOUNTER — Emergency Department (HOSPITAL_COMMUNITY)
Admission: EM | Admit: 2017-06-07 | Discharge: 2017-06-07 | Disposition: A | Discharge status: Home / Self Care | Payer: PPO | Attending: Emergency Medicine | Admitting: Emergency Medicine

## 2017-06-07 ENCOUNTER — Telehealth: Payer: Self-pay | Admitting: Gastroenterology

## 2017-06-07 DIAGNOSIS — Z91040 Latex allergy status: Secondary | ICD-10-CM | POA: Insufficient documentation

## 2017-06-07 DIAGNOSIS — R404 Transient alteration of awareness: Secondary | ICD-10-CM | POA: Diagnosis not present

## 2017-06-07 DIAGNOSIS — R5383 Other fatigue: Secondary | ICD-10-CM | POA: Insufficient documentation

## 2017-06-07 DIAGNOSIS — R1084 Generalized abdominal pain: Secondary | ICD-10-CM | POA: Diagnosis not present

## 2017-06-07 DIAGNOSIS — R1013 Epigastric pain: Secondary | ICD-10-CM | POA: Diagnosis not present

## 2017-06-07 DIAGNOSIS — R531 Weakness: Secondary | ICD-10-CM | POA: Diagnosis not present

## 2017-06-07 LAB — URINALYSIS, ROUTINE W REFLEX MICROSCOPIC
Bilirubin Urine: NEGATIVE
Glucose, UA: NEGATIVE mg/dL
Ketones, ur: 5 mg/dL — AB
Nitrite: NEGATIVE
Protein, ur: NEGATIVE mg/dL
Specific Gravity, Urine: 1.004 — ABNORMAL LOW (ref 1.005–1.030)
pH: 9 — ABNORMAL HIGH (ref 5.0–8.0)

## 2017-06-07 LAB — CBC WITH DIFFERENTIAL/PLATELET
Basophils Absolute: 0 10*3/uL (ref 0.0–0.1)
Basophils Relative: 0 %
Eosinophils Absolute: 0 10*3/uL (ref 0.0–0.7)
Eosinophils Relative: 0 %
HCT: 40.4 % (ref 36.0–46.0)
Hemoglobin: 14.2 g/dL (ref 12.0–15.0)
Lymphocytes Relative: 21 %
Lymphs Abs: 2 10*3/uL (ref 0.7–4.0)
MCH: 31.7 pg (ref 26.0–34.0)
MCHC: 35.1 g/dL (ref 30.0–36.0)
MCV: 90.2 fL (ref 78.0–100.0)
Monocytes Absolute: 0.6 10*3/uL (ref 0.1–1.0)
Monocytes Relative: 7 %
Neutro Abs: 7 10*3/uL (ref 1.7–7.7)
Neutrophils Relative %: 72 %
Platelets: 422 10*3/uL — ABNORMAL HIGH (ref 150–400)
RBC: 4.48 MIL/uL (ref 3.87–5.11)
RDW: 12.3 % (ref 11.5–15.5)
WBC: 9.7 10*3/uL (ref 4.0–10.5)

## 2017-06-07 LAB — COMPREHENSIVE METABOLIC PANEL
ALT: 14 U/L (ref 14–54)
AST: 16 U/L (ref 15–41)
Albumin: 4.5 g/dL (ref 3.5–5.0)
Alkaline Phosphatase: 107 U/L (ref 38–126)
Anion gap: 10 (ref 5–15)
BUN: 15 mg/dL (ref 6–20)
CO2: 27 mmol/L (ref 22–32)
Calcium: 9.9 mg/dL (ref 8.9–10.3)
Chloride: 105 mmol/L (ref 101–111)
Creatinine, Ser: 0.72 mg/dL (ref 0.44–1.00)
GFR calc Af Amer: >60 mL/min (ref >60)
GFR calc non Af Amer: >60 mL/min (ref >60)
Glucose, Bld: 105 mg/dL — ABNORMAL HIGH (ref 65–99)
Potassium: 3.8 mmol/L (ref 3.5–5.1)
Sodium: 142 mmol/L (ref 135–145)
Total Bilirubin: 0.6 mg/dL (ref 0.3–1.2)
Total Protein: 7.7 g/dL (ref 6.5–8.1)

## 2017-06-07 LAB — I-STAT TROPONIN, ED: Troponin i, poc: 0 ng/mL (ref 0.00–0.08)

## 2017-06-07 MED ORDER — ALUM & MAG HYDROXIDE-SIMETH 200-200-20 MG/5ML PO SUSP
30.0000 mL | Freq: Once | ORAL | Status: AC
  Administered 2017-06-07: 30 mL via ORAL
  Filled 2017-06-07: qty 30

## 2017-06-07 NOTE — Telephone Encounter (Signed)
At 4:00 pm patient and her husband were still at the ED, therefore did not try to contact them.

## 2017-06-07 NOTE — ED Notes (Signed)
While in room obtaining blood specimens. Patient requesting either Maalox or crackers.  Informed patient I would let Dr Johnney Killian aware of her request and would let her know what Dr Johnney Killian orders.

## 2017-06-07 NOTE — Telephone Encounter (Signed)
Understood.  Please check later in the day to see if admitted.  If so, she will be seen by our hospital doc.

## 2017-06-07 NOTE — ED Notes (Signed)
Informed patient that when I asked Dr Johnney Killian about maalox and crackers if she had received a fax regarding this patient, she stated that she hadnt.  I personally went and checked fax machine myself and no fax regarding this patient only other patients.   When I informed patient, patient's husband states, "Well, I know that she has received the fax because I called the doctor's office myself!"   I informed husband that I personally checked the fax machine and there was no information regarding patient on it.   Husband responds, "well then you probably didn't check the right one, isnt there four fax machines down here?!?"   I told husband that we only have one operating fax machine in the ED and that was the only one I checked. Husband states, "well is this the right fax number?"  I informed patient that I believe that is the correct fax number. Husband getting on his cell phone, and states, "I'll come find you when I talk to the doctor's office again".

## 2017-06-07 NOTE — Telephone Encounter (Signed)
FYI

## 2017-06-07 NOTE — ED Provider Notes (Signed)
[facility] DEPT Provider Note   CSN: [id number] Arrival date & time: 06/07/17  1141     History   Chief Complaint Chief Complaint  Patient presents with  . Fatigue  . Abdominal Pain    HPI Natalie Erickson is a 66 y.o. female.  HPI Patient has had over 2 months of symptoms that have uncertain etiology. Patient has had multiple specialist consultations in PCP visits without clear etiology. Patient reports that she tolerates most medications very poorly and the medications that have been tried have often resulted in adverse reactions. Patient reports that she's been extremely fatigued. She reports she has ongoing problems with epigastric pain and diarrhea that comes and goes with intermittent constipation. She however has not tolerated medications for gastritis. She had consultation with a rheumatologist and has follow-up tomorrow. They had contacted the office and report the results of her diagnostic studies have returned, they were told by the office that the results could be faxed to another facility to be reviewed if they so desired. Patient was at her physical therapy appointment today but she reports she just felt too weak and exhausted to do the appointment and thus she was referred to the emergency department. Past Medical History:  Diagnosis Date  . Anxiety 04/06/2017  . Dry eyes 04/06/2017  . Elevated BP without diagnosis of hypertension 04/06/2017  . Hiatal hernia   . History of colon polyps   . IBS (irritable bowel syndrome)   . Mixed hyperlipidemia 04/06/2017    Patient Active Problem List   Diagnosis Date Noted  . Fibromyalgia 06/04/2017  . Depression, major, single episode, moderate ([place]) 06/04/2017  . Chronic fatigue 06/04/2017  . Medication intolerance 06/04/2017  . ANA positive 05/20/2017  . 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 Serum calcium elevated 05/20/2017  . Interstitial cystitis 05/20/2017  . Dyspepsia 05/20/2017  . Mixed hyperlipidemia 04/06/2017  . Elevated  BP without diagnosis of hypertension 04/06/2017  . Anxiety 04/06/2017  . Dry eyes 04/06/2017    Past Surgical History:  Procedure Laterality Date  . APPENDECTOMY  1959  . LAPAROSCOPY      OB History    No data available       Home Medications    Prior to Admission medications   Medication Sig Start Date End Date Taking? Authorizing Provider  acetaminophen (TYLENOL) 500 MG tablet Take 1,000 mg by mouth every 6 (six) hours as needed for headache (pain).   Yes [provider]  Cholecalciferol (VITAMIN D) 2000 units tablet Take 2,000 Units by mouth daily.    Yes [provider]  Magnesium 200 MG TABS Take 200 mg by mouth 2 (two) times daily.   Yes [provider]  nitrofurantoin, macrocrystal-monohydrate, (MACROBID) 100 MG capsule Take 1 capsule (100 mg total) by mouth 2 (two) times daily. 06/02/17  Yes Worley, Aldona Bar, PA  Omega-3 Fatty Acids (OMEGA-3 PO) Take 1 capsule by mouth daily.   Yes [provider]  Polyethyl Glycol-Propyl Glycol (SYSTANE ULTRA OP) Apply to eye 4 (four) times daily.   Yes [provider]  ranitidine (ZANTAC) 150 MG tablet Take 1 tablet (150 mg total) by mouth 2 (two) times daily. Patient not taking: Reported on 06/01/2017 05/29/17   E[place], Amy S, PA-C  saccharomyces boulardii (FLORASTOR) 250 MG capsule Take 1 capsule (250 mg total) by mouth 2 (two) times daily. Patient not taking: Reported on 06/01/2017 05/29/17   Alfredia Ferguson, PA-C    Family History Family History  Problem Relation Age of  Onset  . Breast cancer Sister   . Diabetes Paternal Grandmother   . Hypertension Mother   . Lung cancer Father     Social History Social History  Substance Use Topics  . Smoking status: Never Smoker  . Smokeless tobacco: Never Used  . Alcohol use No     Allergies   Statins; Banana; Chocolate; Codeine; Cranberry; Latex; Monosodium glutamate; Penicillins; Antihistamines, loratadine-type; Montelukast; Prednisone;  Sulfites; Zofran [ondansetron hcl]; and Azithromycin   Review of Systems Review of Systems 10 Systems reviewed and are negative for acute change except as noted in the HPI.   Physical Exam Updated Vital Signs BP 132/81   Pulse 93   Temp 97.8 F (36.6 C) (Oral)   Resp (!) 27   Ht 5\' 2"  (1.575 m)   Wt 66.7 kg (147 lb)   SpO2 100%   BMI 26.89 kg/m   Physical Exam  Constitutional: She is oriented to person, place, and time. She appears well-developed and well-nourished. No distress.  Patient is alert and nontoxic. Clinically she is well in appearance. No respiratory distress. Color good.  HENT:  Head: Normocephalic and atraumatic.  Nose: Nose normal.  Mouth/Throat: Oropharynx is clear and moist.  Eyes: Conjunctivae and EOM are normal. Pupils are equal, round, and reactive to light.  Neck: Neck supple.  Cardiovascular: Normal rate, regular rhythm, normal heart sounds and intact distal pulses.   No murmur heard. Pulmonary/Chest: Effort normal and breath sounds normal. No respiratory distress.  Abdominal: Soft. She exhibits no distension. There is tenderness. There is no guarding.  Patient endorses tenderness to palpation of the epigastrium. No guarding. No palpable mass.  Musculoskeletal: Normal range of motion. She exhibits no edema, tenderness or deformity.  Neurological: She is alert and oriented to person, place, and time. No cranial nerve deficit. She exhibits normal muscle tone. Coordination normal.  Skin: Skin is warm and dry.  Psychiatric: She has a normal mood and affect.  Nursing note and vitals reviewed.    ED Treatments / Results  Labs (all labs ordered are listed, but only abnormal results are displayed) Labs Reviewed  COMPREHENSIVE METABOLIC PANEL - Abnormal; Notable for the following:       Result Value   Glucose, Bld 105 (*)    All other components within normal limits  CBC WITH DIFFERENTIAL/PLATELET - Abnormal; Notable for the following:    Platelets 422  (*)    All other components within normal limits  URINALYSIS, ROUTINE W REFLEX MICROSCOPIC - Abnormal; Notable for the following:    Color, Urine STRAW (*)    Specific Gravity, Urine 1.004 (*)    pH 9.0 (*)    Hgb urine dipstick SMALL (*)    Ketones, ur 5 (*)    Leukocytes, UA SMALL (*)    Bacteria, UA RARE (*)    Squamous Epithelial / LPF 0-5 (*)    All other components within normal limits  I-STAT TROPOININ, ED    EKG  EKG Interpretation  Date/Time:  Wednesday June 07 2017 13:55:25 EDT Ventricular Rate:  90 PR Interval:    QRS Duration: 82 QT Interval:  345 QTC Calculation: 423 R Axis:   72 Text Interpretation:  Sinus rhythm Confirmed by Charlesetta Shanks [phone]) on 06/07/2017 3:12:11 PM       Radiology No results found.  Procedures Procedures (including critical care time)  Medications Ordered in ED Medications  alum & mag hydroxide-simeth (MAALOX/MYLANTA) 200-200-20 MG/5ML suspension 30 mL (30 mLs Oral Given 06/07/17 1424)  Initial Impression / Assessment and Plan / ED Course  I have reviewed the triage vital signs and the nursing notes.  Pertinent labs & imaging results that were available during my care of the patient were reviewed by me and considered in my medical decision making (see chart for details).      Final Clinical Impressions(s) / ED Diagnoses   Final diagnoses:  Epigastric pain  Fatigue, unspecified type   Patient has multiple positives on review of systems. At this time I have pursued basic diagnostic evaluation to rule out electrolyte anomaly, any cardiac/ischemic findings or anemia. EKG is normal and cardiac enzymes are negative. She does not have any classic ischemia type presentation. Symptoms are very generalized thus I have low suspicion for cardiac ischemic disease. Electrolytes are within normal limits. No suggestion of dehydration, renal insufficiency or protein malnutrition. CK and CRP have been tested on previous visits. At this  time no obvious myositis. Patient is stable to continue her outpatient diagnostic workup with subspecialty referrals as planned. Patient's husband became very frustrated and irritated by the fact that we did not receive a fax regarding her rheumatoid diagnostic results. It is unclear why this fax did not arrive, reportedly the office tried to send it twice. In any event, they are to be seen at this office tomorrow and there would not be any results that would change todays management. New Prescriptions New Prescriptions   No medications on file     Charlesetta Shanks, MD 06/07/17 1539

## 2017-06-07 NOTE — Telephone Encounter (Signed)
Agree with note by [name].  Inda Coke, PA-C

## 2017-06-07 NOTE — ED Notes (Signed)
ED Provider at bedside. 

## 2017-06-07 NOTE — Telephone Encounter (Signed)
Patient came in for a PT appointment. She told the front desk that she woke with weakness and abdominal pain this morning. I went into the room to do orthostatics on the patient per [facility].  Laying BP 148/96  Pulse 93, sitting BP 148/94  Pulse 97, and standing BP 138/92 pulse 104. Patient looked fatigued and weak. She stated that she has been drinking 2-3 bottles of water a day. Husband was with the patient and stated that he has called GI and Rheumatology this morning , but have not heard from them. He stated that he was going to call 911 this AM, but decided to come here first. Per Aldona Bar patient would need to go to the ER due to having so many reactions to medications to where she could be monitored more closely and possible need for new medication that she may need. Patient and husband agreed, but wanted [place] to call EMS for transportation.

## 2017-06-07 NOTE — ED Triage Notes (Addendum)
Pt brought in by [facility] for fatigue, weakness, loss of appetite, abd pain with intermittent diarrhea and constipation that has been going on for 2 months. Patient had appt with PT today but when there was to weak and they called EMS for patient to be sent to ED for further evaluation.  Patient reports that she has swelling of throat so she has a hard time swallowing so having trouble eating. Natalie Erickson

## 2017-06-07 NOTE — ED Notes (Signed)
Pt left without receiving paperwork. MD spoke with pt before she left.

## 2017-06-07 NOTE — Telephone Encounter (Signed)
Patient arrived for appointment and was "not feeling right." I took a triage form directly to [facility] to access before Physical Therapy appointment.

## 2017-06-07 NOTE — ED Notes (Signed)
Bed: WA20 Expected date:  Expected time:  Means of arrival:  Comments: EMS-abdominal pain X2 months-triage

## 2017-06-07 NOTE — ED Notes (Signed)
Patient has constant nausea, hemorroids acting up last couple days due to constipation causing some bleeding when having BMs. Patient reports that she also having pain in neck and posterior head.

## 2017-06-08 ENCOUNTER — Telehealth: Payer: Self-pay | Admitting: Family Medicine

## 2017-06-08 DIAGNOSIS — R768 Other specified abnormal immunological findings in serum: Secondary | ICD-10-CM | POA: Diagnosis not present

## 2017-06-08 DIAGNOSIS — R5383 Other fatigue: Secondary | ICD-10-CM | POA: Diagnosis not present

## 2017-06-08 DIAGNOSIS — M359 Systemic involvement of connective tissue, unspecified: Secondary | ICD-10-CM | POA: Diagnosis not present

## 2017-06-08 NOTE — Telephone Encounter (Signed)
Patient would like a call back about lab work that she was told by Rheumatology that needs to be done by infectious disease.   Patient also is requesting a referral to [facility].   Patient would like a call by Monday before her appointment on Tuesday. I advised her that Dr. Juleen China is out until Monday and there is no guarantee.  Please advise.

## 2017-06-09 ENCOUNTER — Telehealth: Payer: Self-pay | Admitting: Family Medicine

## 2017-06-09 NOTE — Telephone Encounter (Signed)
Left message for patient to return call to clarify on lab work that she said Rheumatology is requesting.

## 2017-06-09 NOTE — Telephone Encounter (Signed)
Spoke with Natalie Erickson and she stated that Rheumatology is wanting her to see infectious diease to have more test done. I advised that she should call their office to see if they put in the referral. I explained to her that we could not do a referral for [facility] and that was the information that I gave her on her last visit with Dr. Juleen China. She said she would contact them today.

## 2017-06-09 NOTE — Telephone Encounter (Signed)
Please call pt back to sched new appt. She is a highly complex pt recently carried out by EMS earlier this week. Thank you,  -LL

## 2017-06-12 ENCOUNTER — Ambulatory Visit (INDEPENDENT_AMBULATORY_CARE_PROVIDER_SITE_OTHER): Payer: PPO | Admitting: Psychology

## 2017-06-12 ENCOUNTER — Other Ambulatory Visit: Payer: Self-pay | Admitting: Otolaryngology

## 2017-06-12 DIAGNOSIS — J329 Chronic sinusitis, unspecified: Secondary | ICD-10-CM

## 2017-06-12 DIAGNOSIS — F321 Major depressive disorder, single episode, moderate: Secondary | ICD-10-CM | POA: Diagnosis not present

## 2017-06-13 ENCOUNTER — Ambulatory Visit: Payer: PPO | Admitting: Family Medicine

## 2017-06-13 DIAGNOSIS — H01021 Squamous blepharitis right upper eyelid: Secondary | ICD-10-CM | POA: Diagnosis not present

## 2017-06-13 DIAGNOSIS — H10413 Chronic giant papillary conjunctivitis, bilateral: Secondary | ICD-10-CM | POA: Diagnosis not present

## 2017-06-13 DIAGNOSIS — D2312 Other benign neoplasm of skin of left eyelid, including canthus: Secondary | ICD-10-CM | POA: Diagnosis not present

## 2017-06-13 DIAGNOSIS — H01025 Squamous blepharitis left lower eyelid: Secondary | ICD-10-CM | POA: Diagnosis not present

## 2017-06-13 DIAGNOSIS — H01022 Squamous blepharitis right lower eyelid: Secondary | ICD-10-CM | POA: Diagnosis not present

## 2017-06-13 DIAGNOSIS — H2513 Age-related nuclear cataract, bilateral: Secondary | ICD-10-CM | POA: Diagnosis not present

## 2017-06-13 DIAGNOSIS — H01024 Squamous blepharitis left upper eyelid: Secondary | ICD-10-CM | POA: Diagnosis not present

## 2017-06-19 ENCOUNTER — Other Ambulatory Visit: Payer: Self-pay

## 2017-06-19 ENCOUNTER — Telehealth: Payer: Self-pay | Admitting: Family Medicine

## 2017-06-19 DIAGNOSIS — M79604 Pain in right leg: Secondary | ICD-10-CM

## 2017-06-19 DIAGNOSIS — R202 Paresthesia of skin: Secondary | ICD-10-CM

## 2017-06-19 DIAGNOSIS — M79605 Pain in left leg: Secondary | ICD-10-CM

## 2017-06-19 DIAGNOSIS — R200 Anesthesia of skin: Secondary | ICD-10-CM

## 2017-06-19 DIAGNOSIS — R208 Other disturbances of skin sensation: Secondary | ICD-10-CM

## 2017-06-19 DIAGNOSIS — M5440 Lumbago with sciatica, unspecified side: Secondary | ICD-10-CM

## 2017-06-19 DIAGNOSIS — M79672 Pain in left foot: Principal | ICD-10-CM

## 2017-06-19 DIAGNOSIS — M79671 Pain in right foot: Secondary | ICD-10-CM

## 2017-06-19 NOTE — Telephone Encounter (Signed)
Patient is requesting that you give her a call. She states that she needs a referral to neurology, but she would only share that she is having "neurological issues"

## 2017-06-19 NOTE — Telephone Encounter (Signed)
Patient reports she is having tingling in both legs, pain in both legs, pain in both feet, burning sensation in both feet, and low back pain.  Has spoken to Someone at [facility] and they stated she just needed our office to place a referral and they will get her in.  Referral placed.

## 2017-06-20 ENCOUNTER — Telehealth: Payer: Self-pay | Admitting: Family Medicine

## 2017-06-20 NOTE — Telephone Encounter (Signed)
Requesting referral to infectious disease prefers [facility] for Infectious Disease- due to tick bite. 2 tests requested- babesia & ehrlychia

## 2017-06-20 NOTE — Telephone Encounter (Signed)
Where is this request coming from? Was this a recommendation from another provider or the patient?

## 2017-06-20 NOTE — Telephone Encounter (Signed)
Please advise on referral

## 2017-06-21 NOTE — Telephone Encounter (Signed)
Spoke with patient and advised about talking to endocrinology. She will speak with them and let us know what they say.

## 2017-06-21 NOTE — Telephone Encounter (Signed)
Spoke with patient and she first stated that Rheumatology is wanting these labs and the referral to ID. I explained that they can do the referral for her and she stated that they told her that it has to come from her PCP. I have requested records form her rheumatology office. After speaking with patient a little longer patient stated that she wanted the test done because she has had a tick bite in her ear and her sister has had these test done. They was positive on her sister. I advised that I would have to talk to Dr. Juleen China about this.

## 2017-06-21 NOTE — Telephone Encounter (Signed)
She will be seeing Endocrine in 2 days. If Endocrine thinks worth getting, we can add labs.

## 2017-06-23 ENCOUNTER — Ambulatory Visit (INDEPENDENT_AMBULATORY_CARE_PROVIDER_SITE_OTHER): Payer: PPO | Admitting: Endocrinology

## 2017-06-23 ENCOUNTER — Encounter: Payer: Self-pay | Admitting: Endocrinology

## 2017-06-23 DIAGNOSIS — N209 Urinary calculus, unspecified: Secondary | ICD-10-CM | POA: Insufficient documentation

## 2017-06-23 DIAGNOSIS — N200 Calculus of kidney: Secondary | ICD-10-CM

## 2017-06-23 NOTE — Progress Notes (Signed)
Subjective:    Patient ID: Natalie Erickson, female    DOB: 1951/02/12, 66 y.o.   MRN: [id number]  HPI Pt is referred by Dr Juleen China, for hypercalcemia.  6 weeks ago, pt was noted to have mild hypercalcemia. she has never had osteoporosis, thyroid probs, sarcoidosis, cancer, PUD, pancreatitis.  Only bony fracture was left wrist (childhood), and left shoulder (2013), both with significant trauma.  He does not take A supplement.   Pt denies taking Li++, or HCTZ.  She recently took prednisone for allergic rhinitis.  She has intermitt generalized abd pain, and assoc nausea.  She takes mylanta.  Past Medical History:  Diagnosis Date  . Anxiety 04/06/2017  . Dry eyes 04/06/2017  . Elevated BP without diagnosis of hypertension 04/06/2017  . Hiatal hernia   . History of colon polyps   . IBS (irritable bowel syndrome)   . Mixed hyperlipidemia 04/06/2017    Past Surgical History:  Procedure Laterality Date  . APPENDECTOMY  1959  . LAPAROSCOPY      Social History   Social History  . Marital status: Married    Spouse name: N/A  . Number of children: 2  . Years of education: N/A   Occupational History  . retired    Social History Main Topics  . Smoking status: Never Smoker  . Smokeless tobacco: Never Used  . Alcohol use No  . Drug use: No  . Sexual activity: Yes   Other Topics Concern  . Not on file   Social History Narrative  . No narrative on file    Current Outpatient Prescriptions on File Prior to Visit  Medication Sig Dispense Refill  . acetaminophen (TYLENOL) 500 MG tablet Take 1,000 mg by mouth every 6 (six) hours as needed for headache (pain).    . Cholecalciferol (VITAMIN D) 2000 units tablet Take 2,000 Units by mouth daily.     . Magnesium 200 MG TABS Take 200 mg by mouth 2 (two) times daily.    [name] (SYSTANE ULTRA OP) Apply to eye 4 (four) times daily.    . nitrofurantoin, macrocrystal-monohydrate, (MACROBID) 100 MG capsule Take 1 capsule (100  mg total) by mouth 2 (two) times daily. (Patient not taking: Reported on 06/23/2017) 10 capsule 0  . Omega-3 Fatty Acids (OMEGA-3 PO) Take 1 capsule by mouth daily.    . ranitidine (ZANTAC) 150 MG tablet Take 1 tablet (150 mg total) by mouth 2 (two) times daily. (Patient not taking: Reported on 06/23/2017) 60 tablet 6  . saccharomyces boulardii (FLORASTOR) 250 MG capsule Take 1 capsule (250 mg total) by mouth 2 (two) times daily. (Patient not taking: Reported on 06/23/2017) 60 capsule 4   No current facility-administered medications on file prior to visit.     Allergies  Allergen Reactions  . Statins Tinitus    Achy joints, muscle aches  . Banana Other (See Comments)    migraine  . Chocolate Other (See Comments)    migraine  . Codeine Nausea And Vomiting  . Cranberry Other (See Comments)    Cystitis  . Latex Rash  . Monosodium Glutamate Other (See Comments)    Migraine  . Penicillins Rash    Has patient had a PCN reaction causing immediate rash, facial/tongue/throat swelling, SOB or lightheadedness with hypotension: Yes Has patient had a PCN reaction causing severe rash involving mucus membranes or skin necrosis: No Has patient had a PCN reaction that required hospitalization No Has patient had a PCN reaction occurring within  the last 10 years: No If all of the above answers are "NO", then may proceed with Cephalosporin use.   Marland Kitchen Antihistamines, Loratadine-Type     Throat swelling  . Montelukast Other (See Comments)    Throat swelling  . Prednisone Swelling    Throat swelling (no difficulty breathing)  . Sulfites Other (See Comments)    Nausea and headaches  . Zofran [Ondansetron Hcl] Other (See Comments)    Muscle cramps, leg tingling  . Azithromycin Other (See Comments) and Diarrhea    Family History  Problem Relation Age of Onset  . Breast cancer Sister   . Diabetes Paternal Grandmother   . Hypertension Mother   . Lung cancer Father     BP 130/84   Pulse 98   Ht 5'  2" (1.575 m)   Wt 143 lb (64.9 kg)   SpO2 96%   BMI 26.16 kg/m    Review of Systems Denies visual loss, cough, edema, fever, polyuria, hematuria, and rash.  She has lost 20 lbs x 6 weeks.  She has diffuse myalgias, depression, depression, acral numbness, dry throat, and hoarseness.      Objective:   Physical Exam VS: see vs page GEN: no distress HEAD: head: no deformity eyes: no periorbital swelling, no proptosis external nose and ears are normal mouth: no lesion seen NECK: supple, thyroid is not enlarged CHEST WALL: no deformity.  No kyphosis LUNGS: clear to auscultation CV: reg rate and rhythm, no murmur ABD: abdomen is soft, nontender.  no hepatosplenomegaly.  not distended.  no hernia MUSCULOSKELETAL: muscle bulk and strength are grossly normal.  no obvious joint swelling.  gait is normal and steady EXTEMITIES: no deformity.  no ulcer on the feet.  feet are of normal color and temp.  no edema PULSES: dorsalis pedis intact bilat.  no carotid bruit NEURO:  cn 2-12 grossly intact.   readily moves all 4's.  sensation is intact to touch on the feet SKIN:  Normal texture and temperature.  No rash or suspicious lesion is visible.   NODES:  None palpable at the neck PSYCH: alert, well-oriented.  Does not appear anxious nor depressed.  I personally reviewed electrocardiogram tracing (06/07/17): Indication:  Impression: NSR.  No MI.  No hypertrophy. Compared to 05/03/17: no significant change   Lab Results  Component Value Date   PTH 48 05/18/2017   CALCIUM 9.9 06/07/2017   Vit-D=35  CT: Bilateral nonobstructing renal stones, left greater than right.    Assessment & Plan:  Hypercalcemia, new, uncertain etiology Urolithiasis.  Uncertain relationship to hypercalcemia.  Patient Instructions  blood tests are requested for you today.  We'll let you know about the results.

## 2017-06-23 NOTE — Patient Instructions (Signed)
blood tests are requested for you today.  We'll let you know about the results.  

## 2017-06-26 LAB — PTH, INTACT AND CALCIUM
Calcium: 9.7 mg/dL (ref 8.6–10.4)
PTH: 59 pg/mL (ref 14–64)

## 2017-06-26 NOTE — Addendum Note (Signed)
Addended by: Kaylyn Lim I on: 06/26/2017 11:25 AM   Modules accepted: Orders

## 2017-06-27 ENCOUNTER — Ambulatory Visit: Payer: PPO | Admitting: Psychology

## 2017-06-27 LAB — PTH-RELATED PEPTIDE: PTH-Related Protein (PTH-RP): 11 pg/mL — ABNORMAL LOW (ref 14–27)

## 2017-06-27 LAB — PROTEIN ELECTROPHORESIS, SERUM
Albumin ELP: 3.9 g/dL (ref 3.8–4.8)
Alpha-1-Globulin: 0.7 g/dL — ABNORMAL HIGH (ref 0.2–0.3)
Alpha-2-Globulin: 0.8 g/dL (ref 0.5–0.9)
Beta 2: 0.4 g/dL (ref 0.2–0.5)
Beta Globulin: 0.5 g/dL (ref 0.4–0.6)
Gamma Globulin: 0.9 g/dL (ref 0.8–1.7)
Total Protein, Serum Electrophoresis: 7.2 g/dL (ref 6.1–8.1)

## 2017-06-27 LAB — CALCIUM, URINE, 24 HOUR
Calcium, 24 hour urine: 315 mg/24 h — ABNORMAL HIGH (ref 35–250)
Calcium, Ur: 17 mg/dL

## 2017-06-28 LAB — VITAMIN D 1,25 DIHYDROXY
Vitamin D 1, 25 (OH)2 Total: 81 pg/mL — ABNORMAL HIGH (ref 18–72)
Vitamin D2 1, 25 (OH)2: <8 pg/mL
Vitamin D3 1, 25 (OH)2: 81 pg/mL

## 2017-06-30 ENCOUNTER — Telehealth: Payer: Self-pay | Admitting: Endocrinology

## 2017-06-30 ENCOUNTER — Telehealth: Payer: Self-pay | Admitting: Physician Assistant

## 2017-06-30 ENCOUNTER — Inpatient Hospital Stay
Admission: RE | Admit: 2017-06-30 | Discharge: 2017-06-30 | Disposition: A | Discharge status: Home / Self Care | Payer: Self-pay | Source: Ambulatory Visit | Attending: Otolaryngology | Admitting: Otolaryngology

## 2017-06-30 NOTE — Telephone Encounter (Signed)
See message and please advise, Thanks!  

## 2017-06-30 NOTE — Telephone Encounter (Signed)
Patient called to check the status of her lab results. I advised patient that once they are posted that they will be on mychart. Call patient also with the results as soon as possible.

## 2017-06-30 NOTE — Telephone Encounter (Signed)
please call lab: Were 06/23/17 labs drawn. Labs say still "active."

## 2017-06-30 NOTE — Telephone Encounter (Signed)
See message.

## 2017-06-30 NOTE — Telephone Encounter (Signed)
Patient states everything is exactly the same as before. She has not had any improvement of her bloating, gas and lower abdominal pressure. She states no medication has been effective. Today she states her Restasis has caused her symptoms. She has an appointment at [facility] in September. She does not want to retry any of the previous medications. Patient will wait for her appointment at [facility]

## 2017-07-02 ENCOUNTER — Other Ambulatory Visit: Payer: Self-pay | Admitting: Endocrinology

## 2017-07-02 LAB — VITAMIN A: Vitamin A (Retinoic Acid): 50 ug/dL (ref 38–98)

## 2017-07-03 ENCOUNTER — Other Ambulatory Visit (INDEPENDENT_AMBULATORY_CARE_PROVIDER_SITE_OTHER): Payer: PPO

## 2017-07-03 NOTE — Telephone Encounter (Signed)
Labs orders are OK to be drawn at [facility]

## 2017-07-03 NOTE — Telephone Encounter (Signed)
Patient would like for labs to be done at [facility]. The order is in the system however, please make sure that it is updated if necessary for the correct labs when patient schedules at [facility].

## 2017-07-03 NOTE — Telephone Encounter (Signed)
Lab contacted on status of results. Waiting on confirmation from lab.

## 2017-07-03 NOTE — Telephone Encounter (Signed)
Patient notified of MD's response via voicemail.

## 2017-07-06 ENCOUNTER — Ambulatory Visit (INDEPENDENT_AMBULATORY_CARE_PROVIDER_SITE_OTHER): Payer: PPO | Admitting: Neurology

## 2017-07-06 ENCOUNTER — Encounter: Payer: Self-pay | Admitting: Neurology

## 2017-07-06 VITALS — BP 126/88 | HR 98 | Ht 62.0 in | Wt 139.4 lb

## 2017-07-06 DIAGNOSIS — R5382 Chronic fatigue, unspecified: Secondary | ICD-10-CM | POA: Diagnosis not present

## 2017-07-06 DIAGNOSIS — R202 Paresthesia of skin: Secondary | ICD-10-CM

## 2017-07-06 DIAGNOSIS — R519 Headache, unspecified: Secondary | ICD-10-CM

## 2017-07-06 DIAGNOSIS — R51 Headache: Secondary | ICD-10-CM

## 2017-07-06 DIAGNOSIS — R531 Weakness: Secondary | ICD-10-CM

## 2017-07-06 LAB — ANGIOTENSIN CONVERTING ENZYME: Angiotensin-Converting Enzyme: 28 U/L (ref 9–67)

## 2017-07-06 NOTE — Progress Notes (Signed)
[facility]    Provider:  Dr Jaynee Eagles Referring Provider: Briscoe Deutscher, DO Primary Care Physician:  Briscoe Deutscher, DO  CC:  Positional numbness  HPI:  Natalie Erickson is a 66 y.o. female here as a referral from Dr. Juleen China for positional numbness. Since February she started having eye symptoms and sinus issues. PMHx thyroid problems, Fibromyalgia,  cancer, PUD, pancreatitis.  She was on zofran, antobiotics for sinus issues with complications and then she started having nerve problems after taking zofran her toes and hands would tingle, and throbbing in her feet and spasms in her hands. Her right leg gets numb in certain positions, she has a lot of fatigue, today she has pain in her head and headache which is uncommon for her, she has neck pain and anxiety, and she is depressed. The paresthesias are more positional, a certain way she sits or lays in bed her foot will start cramping and pain in the legs. She has muscle spasms in the legs. Symptoms improving. She was evaluated by rheumatology and they ruled out Lupus and other rheumatologic disorders, wanted to put her on prednisone. She has anxiety, she feels it through her whole body. Lots of pain and tingling. Fatigue is her worse problem, she does not want to do anything, mood. Numbness is positional and happens daily. No other focal neurologic deficits, associated symptoms, inciting events or modifiable factors.  Reviewed notes, labs and imaging from outside physicians, which showed  Normal CK 05/2017, nml TSH/crp/esr/ana/mag 04/2017  Review of Systems: Patient complains of symptoms per HPI as well as the following symptoms: no CP, no SOB. Pertinent negatives and positives per HPI. All others negative.   Social History   Social History  . Marital status: Married    Spouse name: N/A  . Number of children: 2  . Years of education: RN   Occupational History  . Retired    Social History Main Topics  . Smoking status:  Never Smoker  . Smokeless tobacco: Never Used  . Alcohol use No  . Drug use: No  . Sexual activity: Yes   Other Topics Concern  . Not on file   Social History Narrative   Lives at home w/ her husband and family   Right-handed   Caffeine: none since March 2018    Family History  Problem Relation Age of Onset  . Breast cancer Sister   . Diabetes Paternal Grandmother   . Hypertension Mother   . Lung cancer Father     Past Medical History:  Diagnosis Date  . Anxiety 04/06/2017  . Cystitis   . Depression   . Dry eyes 04/06/2017  . Elevated BP without diagnosis of hypertension 04/06/2017  . Fibromyalgia   . Hiatal hernia   . History of colon polyps   . IBS (irritable bowel syndrome)   . Mixed hyperlipidemia 04/06/2017  . TMJ (dislocation of temporomandibular joint)   . Vasomotor rhinitis     Past Surgical History:  Procedure Laterality Date  . APPENDECTOMY  1959  . LAPAROSCOPY      Current Outpatient Prescriptions  Medication Sig Dispense Refill  . acetaminophen (TYLENOL) 500 MG tablet Take 1,000 mg by mouth every 6 (six) hours as needed for headache (pain).    . Cholecalciferol (VITAMIN D) 2000 units tablet Take 2,000 Units by mouth daily.     Marland Kitchen MAGNESIUM PO Take 200 mg by mouth daily. Oil    . Omega-3 Fatty Acids (OMEGA-3 PO) Take 1 capsule by mouth  daily.    . Polyethyl Glycol-Propyl Glycol (SYSTANE ULTRA OP) Apply to eye 4 (four) times daily.    . hydrochlorothiazide (HYDRODIURIL) 12.5 MG tablet Take 0.5 tablets (6.25 mg total) by mouth daily. 30 tablet 5   No current facility-administered medications for this visit.     Allergies as of 07/06/2017 - Review Complete 07/06/2017  Allergen Reaction Noted  . Statins Tinitus 04/04/2017  . Banana Other (See Comments) 01/19/2015  . Chocolate Other (See Comments) 01/19/2015  . Codeine Nausea And Vomiting 01/19/2015  . Cranberry Other (See Comments) 01/19/2015  . Latex Rash 01/19/2015  . Monosodium glutamate Other  (See Comments) 01/19/2015  . Penicillins Rash 01/19/2015  . Antihistamines, loratadine-type  05/03/2017  . Montelukast Other (See Comments) 04/26/2017  . Prednisone Swelling 05/27/2017  . Sulfites Other (See Comments) 04/11/2017  . Zofran [ondansetron hcl] Other (See Comments) 05/27/2017  . Azithromycin Other (See Comments) and Diarrhea 01/19/2015    Vitals: BP 126/88   Pulse 98   Ht '5\' 2"'$  (1.575 m)   Wt 139 lb 6.4 oz (63.2 kg)   BMI 25.50 kg/m  Last Weight:  Wt Readings from Last 1 Encounters:  07/06/17 139 lb 6.4 oz (63.2 kg)   Last Height:   Ht Readings from Last 1 Encounters:  07/06/17 '5\' 2"'$  (1.575 m)   Physical exam: Exam: Gen: NAD, flat affect                     CV: RRR, no MRG. No Carotid Bruits. No peripheral edema, warm, nontender Eyes: Conjunctivae clear without exudates or hemorrhage  Neuro: Detailed Neurologic Exam  Speech:    Speech is normal; fluent and spontaneous with normal comprehension.  Cognition:    The patient is oriented to person, place, and time;     recent and remote memory intact;     language fluent;     normal attention, concentration,     fund of knowledge Cranial Nerves:    The pupils are equal, round, and reactive to light. The fundi are normal and spontaneous venous pulsations are present. Visual fields are full to finger confrontation. Extraocular movements are intact. Trigeminal sensation is intact and the muscles of mastication are normal. The face is symmetric. The palate elevates in the midline. Hearing intact. Voice is normal. Shoulder shrug is normal. The tongue has normal motion without fasciculations.   Coordination:    Normal finger to nose and heel to shin. Normal rapid alternating movements.   Gait:    Heel-toe and tandem gait are normal.   Motor Observation:    No asymmetry, no atrophy, and no involuntary movements noted. Tone:    Normal muscle tone.    Posture:    Posture is normal. normal erect    Strength:     Strength is V/V in the upper and lower limbs.      Sensation: intact to LT     Reflex Exam:  DTR's:    Deep tendon reflexes in the upper and lower extremities are normal bilaterally.   Toes:    The toes are downgoing bilaterally.   Clonus:    Clonus is absent.       Assessment/Plan:  66 year old female with new onset headache after the age of 71, paresthesias, fatigue. Likely mood related but need to rule out cerebral causes such as strokes, MS, space-occupying tumors or other lesions.  MRI brain wo contrast (patient declines contrast) Labs   Orders Placed This Encounter  Procedures  . MR BRAIN WO CONTRAST  . Hemoglobin A1c  . Vitamin B1  . Methylmalonic acid, serum  . B12 and Folate Panel  . RPR  . HIV antibody  . Hepatitis C antibody  . Heavy metals, blood  . Vitamin B6  . Multiple Myeloma Panel (SPEP&IFE w/QIG)    Sarina Ill, MD  [facility] [address] [place], [place] [address]  Phone [phone] Fax [phone]

## 2017-07-06 NOTE — Patient Instructions (Addendum)
Remember to drink plenty of fluid, eat healthy meals and do not skip any meals. Try to eat protein with a every meal and eat a healthy snack such as fruit or nuts in between meals. Try to keep a regular sleep-wake schedule and try to exercise daily, particularly in the form of walking, 20-30 minutes a day, if you can.   As far as your diagnostic are concerned, I would like to suggest: Labs, MRI brain consider emg/ncs  Our phone number is [phone]. We also have an after hours call service for urgent matters and there is a physician on-call for urgent questions. For any emergencies you know to call 911 or go to the nearest emergency room

## 2017-07-07 ENCOUNTER — Telehealth: Payer: Self-pay | Admitting: Endocrinology

## 2017-07-07 MED ORDER — HYDROCHLOROTHIAZIDE 12.5 MG PO TABS: 6.2500 mg | ORAL_TABLET | Freq: Every day | ORAL | 5 refills | Status: DC

## 2017-07-07 NOTE — Telephone Encounter (Signed)
Mychart message submitted to patient advising medication has been submitted.

## 2017-07-07 NOTE — Telephone Encounter (Signed)
Patient calling to state she would like to have the medication she discussed with Dr. Loanne Drilling to be prescribed, was waiting on lab results. She says provider was aware of which medication it was.

## 2017-07-07 NOTE — Telephone Encounter (Signed)
Ok, I have sent a prescription to your pharmacy.  I'll see you next time.   

## 2017-07-07 NOTE — Telephone Encounter (Signed)
See message and please advise, Thanks!  

## 2017-07-10 ENCOUNTER — Other Ambulatory Visit (INDEPENDENT_AMBULATORY_CARE_PROVIDER_SITE_OTHER): Payer: PPO

## 2017-07-10 ENCOUNTER — Other Ambulatory Visit: Payer: Self-pay

## 2017-07-10 DIAGNOSIS — R531 Weakness: Secondary | ICD-10-CM | POA: Diagnosis not present

## 2017-07-10 DIAGNOSIS — R5382 Chronic fatigue, unspecified: Secondary | ICD-10-CM

## 2017-07-10 DIAGNOSIS — R519 Headache, unspecified: Secondary | ICD-10-CM

## 2017-07-10 DIAGNOSIS — R202 Paresthesia of skin: Secondary | ICD-10-CM

## 2017-07-10 DIAGNOSIS — R51 Headache: Secondary | ICD-10-CM | POA: Diagnosis not present

## 2017-07-11 ENCOUNTER — Ambulatory Visit: Payer: PPO | Admitting: Psychology

## 2017-07-12 DIAGNOSIS — M3501 Sicca syndrome with keratoconjunctivitis: Secondary | ICD-10-CM | POA: Diagnosis not present

## 2017-07-12 DIAGNOSIS — H1013 Acute atopic conjunctivitis, bilateral: Secondary | ICD-10-CM | POA: Diagnosis not present

## 2017-07-12 DIAGNOSIS — H04123 Dry eye syndrome of bilateral lacrimal glands: Secondary | ICD-10-CM | POA: Diagnosis not present

## 2017-07-13 LAB — HEAVY METALS, BLOOD
Arsenic: 6 ug/L (ref 2–23)
Lead, Blood: 1 ug/dL (ref 0–19)
Mercury: NOT DETECTED ug/L (ref 0.0–14.9)

## 2017-07-14 ENCOUNTER — Telehealth: Payer: Self-pay

## 2017-07-14 LAB — VITAMIN B1: Thiamine: 139.7 nmol/L (ref 66.5–200.0)

## 2017-07-14 LAB — VITAMIN B6: Vitamin B6: 5.7 ug/L (ref 2.0–32.8)

## 2017-07-14 NOTE — Telephone Encounter (Signed)
Please annotate on [facility] results from 07/10/2017. Thanks.

## 2017-07-14 NOTE — Telephone Encounter (Signed)
I did not order those labs. Ordered by Neurology.

## 2017-07-17 LAB — MULTIPLE MYELOMA PANEL, SERUM
Albumin SerPl Elph-Mcnc: 3.7 g/dL (ref 2.9–4.4)
Albumin/Glob SerPl: 1.1 (ref 0.7–1.7)
Alpha 1: 0.3 g/dL (ref 0.0–0.4)
Alpha2 Glob SerPl Elph-Mcnc: 0.9 g/dL (ref 0.4–1.0)
B-Globulin SerPl Elph-Mcnc: 1.3 g/dL (ref 0.7–1.3)
Gamma Glob SerPl Elph-Mcnc: 0.9 g/dL (ref 0.4–1.8)
Globulin, Total: 3.4 g/dL (ref 2.2–3.9)
IgA/Immunoglobulin A, Serum: 244 mg/dL (ref 87–352)
IgG (Immunoglobin G), Serum: 887 mg/dL (ref 700–1600)
IgM (Immunoglobulin M), Srm: 72 mg/dL (ref 26–217)
Total Protein: 7.1 g/dL (ref 6.0–8.5)

## 2017-07-17 LAB — HEMOGLOBIN A1C
Est. average glucose Bld gHb Est-mCnc: 108 mg/dL
Hgb A1c MFr Bld: 5.4 % (ref 4.8–5.6)

## 2017-07-17 LAB — B12 AND FOLATE PANEL
Folate: 12 ng/mL (ref >3.0)
Vitamin B-12: 385 pg/mL (ref 232–1245)

## 2017-07-17 LAB — RPR: RPR Ser Ql: NONREACTIVE

## 2017-07-17 LAB — METHYLMALONIC ACID, SERUM: Methylmalonic Acid: 158 nmol/L (ref 0–378)

## 2017-07-17 LAB — HEPATITIS C ANTIBODY: Hep C Virus Ab: <0.1 s/co ratio (ref 0.0–0.9)

## 2017-07-17 LAB — HIV ANTIBODY (ROUTINE TESTING W REFLEX): HIV Screen 4th Generation wRfx: NONREACTIVE

## 2017-07-18 ENCOUNTER — Telehealth: Payer: Self-pay

## 2017-07-18 NOTE — Telephone Encounter (Signed)
-----   Message from Melvenia Beam, MD sent at 07/17/2017  5:07 PM EDT ----- All labs normal thanks

## 2017-07-18 NOTE — Telephone Encounter (Signed)
Called pt w/ normal lab results. May call back w/ additional questions/concerns.

## 2017-07-19 ENCOUNTER — Other Ambulatory Visit: Payer: Self-pay | Admitting: Family Medicine

## 2017-07-19 DIAGNOSIS — Z1231 Encounter for screening mammogram for malignant neoplasm of breast: Secondary | ICD-10-CM

## 2017-07-25 ENCOUNTER — Ambulatory Visit: Payer: Self-pay | Admitting: Neurology

## 2017-08-01 ENCOUNTER — Telehealth: Payer: Self-pay | Admitting: Physician Assistant

## 2017-08-01 NOTE — Telephone Encounter (Signed)
Patient states constipation and abdominal pain. States ongoing problem. She has noted change in stool caliber. Drinks Miralax for constipation, but not regularly. Take antacid for abdominal pain. Says this helps. She requests an appointment for re-evaluation.

## 2017-08-08 ENCOUNTER — Other Ambulatory Visit: Payer: Self-pay

## 2017-08-09 ENCOUNTER — Other Ambulatory Visit: Payer: Self-pay

## 2017-08-10 ENCOUNTER — Other Ambulatory Visit (INDEPENDENT_AMBULATORY_CARE_PROVIDER_SITE_OTHER): Payer: PPO

## 2017-08-10 ENCOUNTER — Encounter: Payer: Self-pay | Admitting: Physician Assistant

## 2017-08-10 ENCOUNTER — Ambulatory Visit (INDEPENDENT_AMBULATORY_CARE_PROVIDER_SITE_OTHER): Payer: PPO | Admitting: Physician Assistant

## 2017-08-10 VITALS — BP 124/78 | HR 100 | Ht 61.5 in | Wt 139.0 lb

## 2017-08-10 DIAGNOSIS — R1010 Upper abdominal pain, unspecified: Secondary | ICD-10-CM

## 2017-08-10 DIAGNOSIS — R93421 Abnormal radiologic findings on diagnostic imaging of right kidney: Secondary | ICD-10-CM

## 2017-08-10 DIAGNOSIS — K581 Irritable bowel syndrome with constipation: Secondary | ICD-10-CM

## 2017-08-10 DIAGNOSIS — N281 Cyst of kidney, acquired: Secondary | ICD-10-CM

## 2017-08-10 DIAGNOSIS — K5900 Constipation, unspecified: Secondary | ICD-10-CM

## 2017-08-10 DIAGNOSIS — R140 Abdominal distension (gaseous): Secondary | ICD-10-CM

## 2017-08-10 LAB — CREATININE, SERUM: Creatinine, Ser: 0.77 mg/dL (ref 0.40–1.20)

## 2017-08-10 LAB — BUN: BUN: 18 mg/dL (ref 6–23)

## 2017-08-10 NOTE — Progress Notes (Signed)
Thank you for sending this case to me and for discussing it in clinic today. I have reviewed the entire note, and the outlined plan seems appropriate.   Wilfrid Lund, MD

## 2017-08-10 NOTE — Patient Instructions (Addendum)
Please go to the basement level to have your labs drawn.  We have given you samples of Probiotic food capsules, VSL#3. Take 1 capsule daily for 14 days. Then go to 2 capsules daily, ongoing.   The best price is at [facility], [address], [place].  Take Miralax, 1/2 dose daily in 8 oz of water.   Follow up in 3-4 weeks with Amy. Call September 3rd and Amy's schedule should be in for the next 2 weeks.  [phone]  You have been scheduled for an MRI  on Tuesday 08-15-2017. Your appointment time is 8:00 am. Please arrive 15 minutes prior to your appointment time for registration purposes. Please make certain not to have anything to eat or drink after midnight . In addition, if you have any metal in your body, have a pacemaker or defibrillator, please be sure to let your ordering physician know. This test typically takes 45 minutes to 1 hour to complete. Should you need to reschedule, please call [phone] to do so.  The location for the MRI is [facility] building beside [facility].

## 2017-08-10 NOTE — Progress Notes (Signed)
Subjective:    Patient ID: Natalie Erickson, female    DOB: 1951-04-04, 66 y.o.   MRN: [id number]  HPI Natalie Erickson is a pleasant 66 year old white female, known to Dr. Loletha Carrow and myself, who was last seen in June 2018. She comes back in today for follow-up with complaints of persistent intermittent upper abdominal pain bloating and constipation. Patient has history of interstitial cystitis, fibromyalgia and anxiety. She was initially seen in May 2018 with complaints of acute abdominal pain and nausea after she had taken a course of antibiotics/doxycycline and a steroid Dosepak for sinusitis. Around that same time she had developed multiple symptoms including nausea lack of appetite weight loss abdominal discomfort severe fatigue and generalized weakness. She also describes some cramping in her hands and legs periodically. She is very sensitive to medications. She has had fairly extensive evaluation this summer, was referred to endocrinology because of an elevated calcium level. Workup was negative. She also recently had neurologic evaluation because of complaints parasthesias. She has had extensive laboratory evaluation including heavy metal screen etc. all of which returned normal. She says those symptoms have since subsided.   Her generalized fatigue and weakness have also subsided. She says she was doing fairly well in July and feeling better overall and went on vacation to visit her family. While she was there she did develop some nausea constipation and abdominal discomfort. Her symptoms are not nearly as severe as previous but she continues to complaints of constipation and bloating. She did not tolerate Zantac well and felt that IB gard  Made her abdominal pain worse. She is currently using MiraLAX as needed. She says she has been told by other providers that most likely explanation for her constellation of symptoms over the past few months was a prolonged viral syndrome of unclear etiology.  Review of  Systems Pertinent positive and negative review of systems were noted in the above HPI section.  All other review of systems was otherwise negative.  Outpatient Encounter Prescriptions as of 08/10/2017  Medication Sig  . acetaminophen (TYLENOL) 500 MG tablet Take 1,000 mg by mouth every 6 (six) hours as needed for headache (pain).  . Alum & Mag Hydroxide-Simeth (GELUSIL PO) Take by mouth as needed.  . AMBULATORY NON FORMULARY MEDICATION Magnesium oil Apply to legs as needed  . Calcium & Magnesium Carbonates (MYLANTA PO) Take 20 mLs by mouth as needed.  . Cholecalciferol (VITAMIN D) 2000 units tablet Take 2,000 Units by mouth daily.   . hydrochlorothiazide (HYDRODIURIL) 12.5 MG tablet Take 0.5 tablets (6.25 mg total) by mouth daily.  . Omega-3 Fatty Acids (OMEGA-3 PO) Take 1 capsule by mouth daily.  [name] (SYSTANE ULTRA OP) Apply to eye 4 (four) times daily.  . Simethicone (GAS-X PO) Take by mouth as needed.  . [DISCONTINUED] MAGNESIUM PO Take 200 mg by mouth daily. Oil   No facility-administered encounter medications on file as of 08/10/2017.    Allergies  Allergen Reactions  . Statins Tinitus    Achy joints, muscle aches  . Banana Other (See Comments)    migraine  . Chocolate Other (See Comments)    migraine  . Codeine Nausea And Vomiting  . Cranberry Other (See Comments)    Cystitis  . Latex Rash  . Monosodium Glutamate Other (See Comments)    Migraine  . Penicillins Rash    Has patient had a PCN reaction causing immediate rash, facial/tongue/throat swelling, SOB or lightheadedness with hypotension: Yes Has patient had a PCN  reaction causing severe rash involving mucus membranes or skin necrosis: No Has patient had a PCN reaction that required hospitalization No Has patient had a PCN reaction occurring within the last 10 years: No If all of the above answers are "NO", then may proceed with Cephalosporin use.   Marland Kitchen Antihistamines, Loratadine-Type      Throat swelling  . Montelukast Other (See Comments)    Throat swelling  . Prednisone Swelling    Throat swelling (no difficulty breathing)  . Sulfites Other (See Comments)    Nausea and headaches  . Zofran [Ondansetron Hcl] Other (See Comments)    Muscle cramps, leg tingling  . Azithromycin Other (See Comments) and Diarrhea   Patient Active Problem List   Diagnosis Date Noted  . Urolithiasis 06/23/2017  . Fibromyalgia 06/04/2017  . Depression, major, single episode, moderate ([place]) 06/04/2017  . Chronic fatigue 06/04/2017  . Medication intolerance 06/04/2017  . ANA positive 05/20/2017  . 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 Serum calcium elevated 05/20/2017  . Interstitial cystitis 05/20/2017  . Dyspepsia 05/20/2017  . Mixed hyperlipidemia 04/06/2017  . Elevated BP without diagnosis of hypertension 04/06/2017  . Anxiety 04/06/2017  . Dry eyes 04/06/2017   Social History   Social History  . Marital status: Married    Spouse name: N/A  . Number of children: 2  . Years of education: RN   Occupational History  . Retired    Social History Main Topics  . Smoking status: Never Smoker  . Smokeless tobacco: Never Used  . Alcohol use No  . Drug use: No  . Sexual activity: Yes   Other Topics Concern  . Not on file   Social History Narrative   Lives at home w/ her husband and family   Right-handed   Caffeine: none since March 2018    Ms. Raymundo's family history includes Breast cancer in her sister; Diabetes in her paternal grandmother; Hypertension in her mother; Lung cancer in her father.      Objective:    Vitals:   08/10/17 1324  BP: 124/78  Pulse: 100    Physical Exam  well-developed older white female in no acute distress, pleasant and looking very well as compared to my last encounter with her. Much less anxious and brighter. Blood pressure 124/78 pulse 108 5 foot 1, weight 139, BMI of 25.8. HEENT; nontraumatic, cephalic EOMI PERRLA sclera anicteric,  Cardiovascular; regular rate and rhythm with S1-S2 no murmur or gallop, Pulmonary; clear bilaterally, Abdomen ;soft, nondistended no palpable mass or hepatosplenomegaly there is no focal tenderness bowel sounds are present, Rectal ;exam not done, Extremities ;no clubbing cyanosis or edema skin warm and dry, Neuropsych; mood and affect appropriate       Assessment & Plan:   #37 66 year old white female with complaints of intermittent upper abdominal discomfort, generalized abdominal bloating and constipation Most consistent with IBS and dysbiosis secondary to recent prolonged probable viral illness. She is much improved generally, looks great and is significantly less anxious. GI workup with EGD and recent CT of the abdomen and pelvis in May 2018 unrevealing. She is very sensitive to medications  #2  9 mm right renal lesion noted on CT May 2018, and follow-up MRI recommended #3 nephrolithiasis #4 history of colon polyps-colonoscopy 2016/[place] advised to have 5 year interval follow-up #5 interstitial cystitis #6 history of fibromyalgia  Plan; advised to try a very low dose MiraLAX,, one half dose daily in 8 ounces of water, and can titrate that to one half  dose every other day if needed  Will add a trial of VSL #3, 112 billion/capsule-will start with very low dose 1 capsule daily 2 weeks and then increase to one capsule twice daily and continue with beneficial. Will schedule for MRI of the right kidney. She'll follow up with Dr. Loletha Carrow or myself on an as-needed basis. Amy S Esterwood PA-C 08/10/2017   Cc: Briscoe Deutscher, DO

## 2017-08-15 ENCOUNTER — Ambulatory Visit (HOSPITAL_COMMUNITY): Payer: PPO

## 2017-08-16 ENCOUNTER — Telehealth: Payer: Self-pay | Admitting: *Deleted

## 2017-08-16 NOTE — Telephone Encounter (Signed)
Received denial from Cover My Meds for the zofran 4 mg ( Ondansetron). Date 05-24-2017.  I called pharmacy to advise.  Called patient and left message on her cell phone.

## 2017-08-17 ENCOUNTER — Other Ambulatory Visit: Payer: Self-pay | Admitting: Physician Assistant

## 2017-08-17 ENCOUNTER — Ambulatory Visit (HOSPITAL_COMMUNITY)
Admission: RE | Admit: 2017-08-17 | Discharge: 2017-08-17 | Disposition: A | Discharge status: Home / Self Care | Payer: PPO | Source: Ambulatory Visit | Attending: Physician Assistant | Admitting: Physician Assistant

## 2017-08-17 DIAGNOSIS — N2889 Other specified disorders of kidney and ureter: Secondary | ICD-10-CM | POA: Diagnosis not present

## 2017-08-17 DIAGNOSIS — N281 Cyst of kidney, acquired: Secondary | ICD-10-CM | POA: Insufficient documentation

## 2017-08-17 DIAGNOSIS — R93421 Abnormal radiologic findings on diagnostic imaging of right kidney: Secondary | ICD-10-CM

## 2017-08-23 ENCOUNTER — Ambulatory Visit: Payer: Self-pay

## 2017-08-23 DIAGNOSIS — H1013 Acute atopic conjunctivitis, bilateral: Secondary | ICD-10-CM | POA: Diagnosis not present

## 2017-08-23 DIAGNOSIS — H04123 Dry eye syndrome of bilateral lacrimal glands: Secondary | ICD-10-CM | POA: Diagnosis not present

## 2017-08-23 DIAGNOSIS — H2513 Age-related nuclear cataract, bilateral: Secondary | ICD-10-CM | POA: Diagnosis not present

## 2017-08-23 DIAGNOSIS — M3501 Sicca syndrome with keratoconjunctivitis: Secondary | ICD-10-CM | POA: Diagnosis not present

## 2017-08-29 ENCOUNTER — Ambulatory Visit
Admission: RE | Admit: 2017-08-29 | Discharge: 2017-08-29 | Disposition: A | Discharge status: Home / Self Care | Payer: PPO | Source: Ambulatory Visit | Attending: Family Medicine | Admitting: Family Medicine

## 2017-08-29 DIAGNOSIS — Z1231 Encounter for screening mammogram for malignant neoplasm of breast: Secondary | ICD-10-CM | POA: Diagnosis not present

## 2017-10-04 ENCOUNTER — Ambulatory Visit (INDEPENDENT_AMBULATORY_CARE_PROVIDER_SITE_OTHER): Payer: PPO

## 2017-10-04 DIAGNOSIS — Z23 Encounter for immunization: Secondary | ICD-10-CM

## 2017-10-05 DIAGNOSIS — Z23 Encounter for immunization: Secondary | ICD-10-CM

## 2017-10-06 ENCOUNTER — Other Ambulatory Visit: Payer: Self-pay

## 2017-10-26 DIAGNOSIS — R109 Unspecified abdominal pain: Secondary | ICD-10-CM | POA: Diagnosis not present

## 2017-11-10 DIAGNOSIS — M9901 Segmental and somatic dysfunction of cervical region: Secondary | ICD-10-CM | POA: Diagnosis not present

## 2017-11-10 DIAGNOSIS — M791 Myalgia, unspecified site: Secondary | ICD-10-CM | POA: Diagnosis not present

## 2017-11-10 DIAGNOSIS — M9902 Segmental and somatic dysfunction of thoracic region: Secondary | ICD-10-CM | POA: Diagnosis not present

## 2017-11-17 DIAGNOSIS — M9901 Segmental and somatic dysfunction of cervical region: Secondary | ICD-10-CM | POA: Diagnosis not present

## 2017-11-17 DIAGNOSIS — M9902 Segmental and somatic dysfunction of thoracic region: Secondary | ICD-10-CM | POA: Diagnosis not present

## 2017-12-01 DIAGNOSIS — M9901 Segmental and somatic dysfunction of cervical region: Secondary | ICD-10-CM | POA: Diagnosis not present

## 2017-12-01 DIAGNOSIS — M9902 Segmental and somatic dysfunction of thoracic region: Secondary | ICD-10-CM | POA: Diagnosis not present

## 2017-12-28 DIAGNOSIS — M9901 Segmental and somatic dysfunction of cervical region: Secondary | ICD-10-CM | POA: Diagnosis not present

## 2017-12-28 DIAGNOSIS — M9902 Segmental and somatic dysfunction of thoracic region: Secondary | ICD-10-CM | POA: Diagnosis not present

## 2018-01-03 DIAGNOSIS — M9901 Segmental and somatic dysfunction of cervical region: Secondary | ICD-10-CM | POA: Diagnosis not present

## 2018-01-03 DIAGNOSIS — M9902 Segmental and somatic dysfunction of thoracic region: Secondary | ICD-10-CM | POA: Diagnosis not present

## 2018-01-30 ENCOUNTER — Encounter: Payer: Self-pay | Admitting: Physician Assistant

## 2018-01-30 ENCOUNTER — Ambulatory Visit (INDEPENDENT_AMBULATORY_CARE_PROVIDER_SITE_OTHER): Payer: PPO | Admitting: Physician Assistant

## 2018-01-30 VITALS — BP 126/70 | HR 82 | Ht 61.81 in | Wt 138.0 lb

## 2018-01-30 DIAGNOSIS — K5904 Chronic idiopathic constipation: Secondary | ICD-10-CM | POA: Diagnosis not present

## 2018-01-30 DIAGNOSIS — K589 Irritable bowel syndrome without diarrhea: Secondary | ICD-10-CM | POA: Insufficient documentation

## 2018-01-30 DIAGNOSIS — Z8719 Personal history of other diseases of the digestive system: Secondary | ICD-10-CM | POA: Diagnosis not present

## 2018-01-30 DIAGNOSIS — Z86010 Personal history of colonic polyps: Secondary | ICD-10-CM | POA: Diagnosis not present

## 2018-01-30 NOTE — Patient Instructions (Signed)
USe the Anusol HC Suppositories at bedtime as needed. Take Miralax  By mouth 17 grams in 8 oz of water as needed.  Call us in May- [phone], choose option 2,  ask to speak to nurse Beth to schedule MRI of kidney.

## 2018-01-30 NOTE — Progress Notes (Signed)
Subjective:    Patient ID: Natalie Erickson, female    DOB: Aug 03, 1951, 67 y.o.   MRN: [id number]  HPI Natalie Erickson is a very nice 66 year old white female, known to Dr. Orma Flaming and myself. She was Initially seen in May 2018 with acute abdominal pain and nausea onset after she had taken a course of doxycycline and a steroid Dosepak for sinusitis. She developed multiple symptoms including nausea, lack of appetite, weight loss abdominal pain severe fatigue and generalized weakness. She was also having some cramping in her hands and legs and paresthesias. She had rather extensive workup done and ultimately it was felt that she had had a prolonged viral syndrome. She underwent EGD which was unremarkable and CT of the abdomen and pelvis which was negative with the exception of a small right renal lesion.  When she was seen back in August 2018, she was feeling much better. She was still having intermittent abdominal discomfort bloating and constipation consistent with IBS. She was unable to tolerate any of the medications she was given and was preferring to remain off of prescription medications. At that time she was given a trial of the VSL #3, was to use MiraLAX on an as-needed basis. She had an MRI of the right kidney done which did show a small right interpolar lesion most likely a small hemorrhagic lesion and follow-up imaging was recommended in May 2019. Prior colonoscopy had been done in 2016 in [place], she did have polyps and 5 year interval follow-up was recommended. She comes back in today, having called last week after she had problems with abdominal pain and constipation. She has been seeing a naturopathic physician associated with [facility] and has been taking  Ultimate  flora probiotic, a low-dose magnesium supplement 120 mg daily and a supplement called Triphala mg by mouth daily which she just started last week which is to support digestive health and help with constipation. She says she is feeling  better with that supplement and not feeling constipated this week. She says her abdomen feels better as well. She continues to follow a low Fodmap  diet and is also following an Arevedic diet which has her off of pork and [place]. She says she's trying very hard to get completely well and has been doing yoga 4 times a week and also taking Tai Chi classes.   Review of Systems Pertinent positive and negative review of systems were noted in the above HPI section.  All other review of systems was otherwise negative.  Outpatient Encounter Medications as of 01/30/2018  Medication Sig  . acetaminophen (TYLENOL) 500 MG tablet Take 1,000 mg by mouth every 6 (six) hours as needed for headache (pain).  . AMBULATORY NON FORMULARY MEDICATION Magnesium oil Apply to legs as needed  . AMBULATORY NON FORMULARY MEDICATION Medication Name: triphala 500 mg a day  . AMBULATORY NON FORMULARY MEDICATION Medication Name: probiotic ultimate flora  . Calcium & Magnesium Carbonates (MYLANTA PO) Take 20 mLs by mouth as needed.  . Cholecalciferol (VITAMIN D) 2000 units tablet Take 2,000 Units by mouth daily.   Marland Kitchen MAGNESIUM GLUCONATE PO Take by mouth.  . NON FORMULARY   . Omega-3 Fatty Acids (OMEGA-3 PO) Take 1 capsule by mouth daily.  [name] (SYSTANE ULTRA OP) Apply to eye 4 (four) times daily.  . Simethicone (GAS-X PO) Take by mouth as needed.  . [DISCONTINUED] Alum & Mag Hydroxide-Simeth (GELUSIL PO) Take by mouth as needed.  . [DISCONTINUED] hydrochlorothiazide (HYDRODIURIL) 12.5 MG  tablet Take 0.5 tablets (6.25 mg total) by mouth daily.   No facility-administered encounter medications on file as of 01/30/2018.    Allergies  Allergen Reactions  . Statins Tinitus    Achy joints, muscle aches  . Banana Other (See Comments)    migraine  . Chocolate Other (See Comments)    migraine  . Codeine Nausea And Vomiting  . Cranberry Other (See Comments)    Cystitis  . Latex Rash  . Monosodium  Glutamate Other (See Comments)    Migraine  . Penicillins Rash    Has patient had a PCN reaction causing immediate rash, facial/tongue/throat swelling, SOB or lightheadedness with hypotension: Yes Has patient had a PCN reaction causing severe rash involving mucus membranes or skin necrosis: No Has patient had a PCN reaction that required hospitalization No Has patient had a PCN reaction occurring within the last 10 years: No If all of the above answers are "NO", then may proceed with Cephalosporin use.   Marland Kitchen Antihistamines, Loratadine-Type     Throat swelling  . Montelukast Other (See Comments)    Throat swelling  . Prednisone Swelling    Throat swelling (no difficulty breathing)  . Sulfites Other (See Comments)    Nausea and headaches  . Zofran [Ondansetron Hcl] Other (See Comments)    Muscle cramps, leg tingling  . Azithromycin Other (See Comments) and Diarrhea   Patient Active Problem List   Diagnosis Date Noted  . History of IBS 01/30/2018  . Urolithiasis 06/23/2017  . Fibromyalgia 06/04/2017  . Depression, major, single episode, moderate ([place]) 06/04/2017  . Chronic fatigue 06/04/2017  . Medication intolerance 06/04/2017  . ANA positive 05/20/2017  . 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 Serum calcium elevated 05/20/2017  . Interstitial cystitis 05/20/2017  . Dyspepsia 05/20/2017  . Mixed hyperlipidemia 04/06/2017  . Elevated BP without diagnosis of hypertension 04/06/2017  . Anxiety 04/06/2017  . Dry eyes 04/06/2017   Social History   Socioeconomic History  . Marital status: Married    Spouse name: Not on file  . Number of children: 2  . Years of education: Therapist, sports  . Highest education level: Not on file  Social Needs  . Financial resource strain: Not on file  . Food insecurity - worry: Not on file  . Food insecurity - inability: Not on file  . Transportation needs - medical: Not on file  . Transportation needs - non-medical: Not on file  Occupational History  .  Occupation: Retired  Tobacco Use  . Smoking status: Never Smoker  . Smokeless tobacco: Never Used  Substance and Sexual Activity  . Alcohol use: No  . Drug use: No  . Sexual activity: Yes  Other Topics Concern  . Not on file  Social History Narrative   Lives at home w/ her husband and family   Right-handed   Caffeine: none since March 2018    Ms. Knope's family history includes Breast cancer in her sister; Diabetes in her paternal grandmother; Hypertension in her mother; Lung cancer in her father.      Objective:    Vitals:   01/30/18 1010  BP: 126/70  Pulse: 82    Physical Exam  well-developed older white female in no acute distress, very pleasant blood pressure 126/70 pulse 82, height 5 foot 1, weight 138, BMI of 25.4. HEENT; nontraumatic normocephalic EOMI PERRLA sclera anicteric, Cardiovascular ;regular rate and rhythm with S1-S2 no murmur or gallop, Pulmonary ;clear bilaterally, Abdomen; soft, she has some mild nonfocal tenderness no  guarding or rebound, no distention., bowel sounds are present, Rectal ;exam not done, Extremities; no clubbing cyanosis or edema skin warm and dry, Neuropsych ;mood and affect appropriate       Assessment & Plan:   #73 67 year old white female with interstitial cystitis, fibromyalgia, anxiety, chronic fatigue, who had a prolonged illness likely viral last spring with a multitude of symptoms as outlined above. Symptoms have all resolved, but continues to have some issues with IBS which overall is much improved as well. #2 mild constipation secondary to above #3 colon cancer surveillance-last colonoscopy 2016, with history of polyps, indicated for 5 year interval follow-up #4 right renal lesion noted on CT 5 2018, MRI done August 2018, suggested a benign hemorrhagic lesion but recommended repeat imaging in May 2019  Plan; She is doing very well, is incredibly sensitive to prescription medications, but feeling well on limited supplements as  outlined above. She will continue MiraLAX on an as-needed basis Continued low Fodmap  diet She will continue probiotics, suggested she could alternate probiotics or give herself a break every couple of months and, off probiotics. She will call back in May 2019 to schedule for MRI of the right kidney for follow-up She will follow up with Dr. Loletha Carrow  or myself on an as-needed basis.  Dejaun Vidrio S Sicilia Killough PA-C 01/30/2018   Cc: Briscoe Deutscher, DO

## 2018-01-30 NOTE — Progress Notes (Signed)
Thank you for sending this case to me. I have reviewed the entire note, and the outlined plan seems appropriate.  I do not know what is in those particular supplements, so cannot comment on safety/efficacy of them. Sounds like she is doing well at this point and does not need any further testing.  I recall that she is very sensitive to medicines.  Wilfrid Lund, MD

## 2018-02-27 DIAGNOSIS — M797 Fibromyalgia: Secondary | ICD-10-CM | POA: Diagnosis not present

## 2018-02-27 DIAGNOSIS — K581 Irritable bowel syndrome with constipation: Secondary | ICD-10-CM | POA: Diagnosis not present

## 2018-02-27 DIAGNOSIS — F439 Reaction to severe stress, unspecified: Secondary | ICD-10-CM | POA: Diagnosis not present

## 2018-02-27 DIAGNOSIS — K5904 Chronic idiopathic constipation: Secondary | ICD-10-CM | POA: Diagnosis not present

## 2018-03-19 ENCOUNTER — Encounter: Payer: Self-pay | Admitting: Family Medicine

## 2018-03-19 ENCOUNTER — Ambulatory Visit (INDEPENDENT_AMBULATORY_CARE_PROVIDER_SITE_OTHER): Payer: PPO | Admitting: Family Medicine

## 2018-03-19 ENCOUNTER — Telehealth: Payer: Self-pay | Admitting: Family Medicine

## 2018-03-19 VITALS — BP 118/72 | HR 83 | Temp 98.0°F | Ht 61.81 in | Wt 136.4 lb

## 2018-03-19 DIAGNOSIS — R300 Dysuria: Secondary | ICD-10-CM

## 2018-03-19 DIAGNOSIS — M797 Fibromyalgia: Secondary | ICD-10-CM | POA: Diagnosis not present

## 2018-03-19 DIAGNOSIS — Z8719 Personal history of other diseases of the digestive system: Secondary | ICD-10-CM

## 2018-03-19 LAB — POCT URINALYSIS DIPSTICK
Bilirubin, UA: NEGATIVE
Glucose, UA: NEGATIVE
Ketones, UA: NEGATIVE
Nitrite, UA: NEGATIVE
Protein, UA: NEGATIVE
Spec Grav, UA: 1.015 (ref 1.010–1.025)
Urobilinogen, UA: 0.2 E.U./dL
pH, UA: 5 (ref 5.0–8.0)

## 2018-03-19 MED ORDER — CIPROFLOXACIN HCL 250 MG PO TABS: 250.0000 mg | ORAL_TABLET | Freq: Two times a day (BID) | ORAL | 0 refills | Status: DC

## 2018-03-19 NOTE — Telephone Encounter (Signed)
Spoke with the patient and advised per Dr. Juleen China that this was the mildest antibiotic. Offered patient uribel until culture comes back. Patient did not want this. She will start Cipro tomorrow morning.

## 2018-03-19 NOTE — Telephone Encounter (Signed)
Returned call from patient regarding concerns  About  Cipro  Seen today by Dr  Juleen China for UTI  Pt has picked medication up from Pharmacy but has not taken yet after reading  Insert given by Pharmacy accompanying the  RX    Pt is  Concerned about possible  Tendon side affects as  She does   Yoga and [place] . Pt states  She is intolerant to many medications and  She is  Requesting what  She  Envisions to be a  Milder anti biotic. She  Is seeking reassurance  From the  Provider  [phone]

## 2018-03-19 NOTE — Telephone Encounter (Signed)
Copied from [place] [id number]. Topic: Quick Communication - Rx Refill/Question >> Mar 19, 2018  1:03 PM Oliver Pila B wrote: Medication: ciprofloxacin (CIPRO) 250 MG tablet [[id number]] Pt is concerned about the strength of medication since she has IBS, and is wanting a milder Rx, contact pt to advise

## 2018-03-19 NOTE — Progress Notes (Signed)
Natalie Erickson is a 67 y.o. female here for an acute visit.  History of Present Illness:   [name] CMA acting as scribe for Dr. Juleen China.  Dysuria   This is a new problem. The current episode started in the past 7 days. The problem has been gradually worsening. The pain is moderate. There has been no fever. There is no history of pyelonephritis. Associated symptoms include hesitancy and urgency. Pertinent negatives include no chills, discharge, flank pain, hematuria, nausea or vomiting. She has tried nothing for the symptoms.   PMHx, SurgHx, SocialHx, Medications, and Allergies were reviewed in the Visit Navigator and updated as appropriate.  Review of Systems  Constitutional: Negative for chills and fever.  HENT: Negative for ear pain and hearing loss.   Eyes: Negative for blurred vision and double vision.  Respiratory: Negative for cough and shortness of breath.   Cardiovascular: Negative for chest pain and palpitations.  Gastrointestinal: Negative for heartburn, nausea and vomiting.  Genitourinary: Positive for dysuria, hesitancy and urgency. Negative for flank pain and hematuria.  Musculoskeletal: Negative for back pain and neck pain.  Neurological: Negative for dizziness and headaches.  Psychiatric/Behavioral: Negative for depression and suicidal ideas.   Current Medications:   .  acetaminophen (TYLENOL) 500 MG tablet, Take 1,000 mg by mouth every 6 (six) hours as needed for headache (pain)., Disp: , Rfl:  .  AMBULATORY NON FORMULARY MEDICATION, Magnesium oil Apply to legs as needed, Disp: , Rfl:  .  AMBULATORY NON FORMULARY MEDICATION, Medication Name: triphala 500 mg a day, Disp: , Rfl:  .  AMBULATORY NON FORMULARY MEDICATION, Medication Name: probiotic ultimate flora, Disp: , Rfl:  .  Calcium & Magnesium Carbonates (MYLANTA PO), Take 20 mLs by mouth as needed., Disp: , Rfl:  .  Cholecalciferol (VITAMIN D) 2000 units tablet, Take 2,000 Units by mouth daily. , Disp: , Rfl:    .  MAGNESIUM GLUCONATE PO, Take by mouth., Disp: , Rfl:  .  NON FORMULARY, , Disp: , Rfl:  .  Omega-3 Fatty Acids (OMEGA-3 PO), Take 1 capsule by mouth daily., Disp: , Rfl:  .  Polyethyl Glycol-Propyl Glycol (SYSTANE ULTRA OP), Apply to eye 4 (four) times daily., Disp: , Rfl:  .  Simethicone (GAS-X PO), Take by mouth as needed., Disp: , Rfl:    Allergies  Allergen Reactions  . Statins Tinitus    Achy joints, muscle aches  . Banana Other (See Comments)    migraine  . Chocolate Other (See Comments)    migraine  . Codeine Nausea And Vomiting  . Cranberry Other (See Comments)    Cystitis  . Latex Rash  . Monosodium Glutamate Other (See Comments)    Migraine  . Penicillins Rash    Has patient had a PCN reaction causing immediate rash, facial/tongue/throat swelling, SOB or lightheadedness with hypotension: Yes Has patient had a PCN reaction causing severe rash involving mucus membranes or skin necrosis: No Has patient had a PCN reaction that required hospitalization No Has patient had a PCN reaction occurring within the last 10 years: No If all of the above answers are "NO", then may proceed with Cephalosporin use.   Marland Kitchen Antihistamines, Loratadine-Type     Throat swelling  . Montelukast Other (See Comments)    Throat swelling  . Prednisone Swelling    Throat swelling (no difficulty breathing)  . Sulfites Other (See Comments)    Nausea and headaches  . Zofran [Ondansetron Hcl] Other (See Comments)    Muscle cramps,  leg tingling  . Azithromycin Other (See Comments) and Diarrhea   Review of Systems:   Pertinent items are noted in the HPI. Otherwise, ROS is negative.  Vitals:   Vitals:   03/19/18 0905  BP: 118/72  Pulse: 83  Temp: 98 F (36.7 C)  TempSrc: Oral  SpO2: 98%  Weight: 136 lb 6.4 oz (61.9 kg)  Height: 5' 1.81" (1.57 m)     Body mass index is 25.1 kg/m.  Physical Exam:   Physical Exam  Constitutional: She appears well-developed and well-nourished. No  distress.  HENT:  Head: Normocephalic and atraumatic.  Eyes: Pupils are equal, round, and reactive to light. EOM are normal.  Neck: Normal range of motion. Neck supple.  Cardiovascular: Normal rate, regular rhythm, normal heart sounds and intact distal pulses.  Pulmonary/Chest: Effort normal.  Abdominal: Soft.  Skin: Skin is warm.  Psychiatric: She has a normal mood and affect. Her behavior is normal.  Nursing note and vitals reviewed.   Results for orders placed or performed in visit on 03/19/18  POCT urinalysis dipstick  Result Value Ref Range   Color, UA Yellow    Clarity, UA Hazy    Glucose, UA Negative    Bilirubin, UA Negative    Ketones, UA Negative    Spec Grav, UA 1.015 1.010 - 1.025   Blood, UA Small    pH, UA 5.0 5.0 - 8.0   Protein, UA Negative    Urobilinogen, UA 0.2 0.2 or 1.0 E.U./dL   Nitrite, UA Negative    Leukocytes, UA Moderate (2+) (A) Negative   Appearance     Odor     Assessment and Plan:   1. Dysuria - POCT urinalysis dipstick - ciprofloxacin (CIPRO) 250 MG tablet; Take 1 tablet (250 mg total) by mouth 2 (two) times daily.  Dispense: 6 tablet; Refill: 0  2. History of IBS Patient taking probiotics.  3. Fibromyalgia She is doing yoga and tai chi regularly. Breathing techniques have been taught. Followed by [facility].   . Reviewed expectations re: course of current medical issues. . Discussed self-management of symptoms. . Outlined signs and symptoms indicating need for more acute intervention. . Patient verbalized understanding and all questions were answered. Marland Kitchen Health Maintenance issues including appropriate healthy diet, exercise, and smoking avoidance were discussed with patient. . See orders for this visit as documented in the electronic medical record. . Patient received an After Visit Summary.  CMA served as Education administrator during this visit. History, Physical, and Plan performed by medical provider. The above documentation has been  reviewed and is accurate and complete. Briscoe Deutscher, D.O.   Briscoe Deutscher, DO [place] 03/19/2018

## 2018-03-22 ENCOUNTER — Other Ambulatory Visit: Payer: PPO

## 2018-03-22 ENCOUNTER — Telehealth: Payer: Self-pay | Admitting: Family Medicine

## 2018-03-22 ENCOUNTER — Other Ambulatory Visit: Payer: Self-pay

## 2018-03-22 DIAGNOSIS — R300 Dysuria: Secondary | ICD-10-CM | POA: Diagnosis not present

## 2018-03-22 MED ORDER — URIBEL 118 MG PO CAPS: ORAL_CAPSULE | ORAL | 0 refills | Status: DC

## 2018-03-22 NOTE — Telephone Encounter (Signed)
Patient declined the Uribel. She states that she does not want to start medications with out a culture. She will come in today for lab visit and drop of urine for culture. She will come by today and drop off. She is not having any fever, nasua or vomiting. She is drinking extra fluids and still having about normal urine output every day. Advised patient that if any changes in symptoms to call office or go to ED right away.

## 2018-03-22 NOTE — Telephone Encounter (Signed)
Urine culture was supposed to have been ordered at the time of her visit.  If it was not completed, please let her know.  Apologize for [place].  Ask how she is feeling.  We may also offer to complete a test of cure urinalysis.

## 2018-03-22 NOTE — Telephone Encounter (Signed)
See note

## 2018-03-22 NOTE — Telephone Encounter (Signed)
Copied from [place] [id number]. Topic: Quick Communication - Lab Results >> Mar 22, 2018  8:40 AM Lennox Solders wrote: Pt is calling requesting urine result. Pt had test on Monday 03-19-18

## 2018-03-22 NOTE — Telephone Encounter (Signed)
I sent in Uribel. Warn her that it will change her urine blue. Use two to three times daily for two to three days. It has an antiseptic in it. Just like ANY MEDICATION that we prescribe, I can't guarantee that she won't have a reaction. I STILL recommend the Cipro.

## 2018-03-22 NOTE — Addendum Note (Signed)
Addended by: Briscoe Deutscher R on: 03/22/2018 02:16 PM   Modules accepted: Orders

## 2018-03-22 NOTE — Telephone Encounter (Signed)
Patient would like a call back to clarify if an alternate treatment for her UTI will be called in. When I mentioned Uribel, she said that it's not an antibiotic and would like a call back from a cma to discuss this matter.

## 2018-03-22 NOTE — Telephone Encounter (Signed)
All that I see is the dip. I do not see culture was ordered?

## 2018-03-22 NOTE — Telephone Encounter (Signed)
Scheduled patient for AWV on 04/05/18. Patient stated she does not see anyone other than Dr. Juleen China for her AWV and that she did not want to see a nurse. Please advise.

## 2018-03-22 NOTE — Telephone Encounter (Signed)
She did not take her abx she was given. She wanted to see if their was any other abx that you would call in something for her other than the cipro. She is still having burning with urination and discomfort. The pressure is a little better but other symptoms have not improved. She is wants abx that is not as strong due to past history.

## 2018-03-23 LAB — URINE CULTURE
MICRO NUMBER:: 90388427
SPECIMEN QUALITY:: ADEQUATE

## 2018-03-23 NOTE — Telephone Encounter (Signed)
Left message for patient to return call.

## 2018-03-23 NOTE — Telephone Encounter (Signed)
Please advise 

## 2018-03-23 NOTE — Telephone Encounter (Signed)
Patient is going to hold off on taking the ABX until urine culture gets back. She again stated that she does not want to take the uribel due to ASA being in it.

## 2018-03-23 NOTE — Telephone Encounter (Signed)
Patient would like to hold off and call back to schedule AWV.

## 2018-03-23 NOTE — Telephone Encounter (Signed)
This is up to Dr Juleen China, if Dr Juleen China agrees to do it then please reschedule patient on her schedule.

## 2018-03-23 NOTE — Telephone Encounter (Signed)
No. Needs to be with [facility].

## 2018-03-28 DIAGNOSIS — B373 Candidiasis of vulva and vagina: Secondary | ICD-10-CM | POA: Diagnosis not present

## 2018-03-28 DIAGNOSIS — K644 Residual hemorrhoidal skin tags: Secondary | ICD-10-CM | POA: Diagnosis not present

## 2018-03-28 DIAGNOSIS — N952 Postmenopausal atrophic vaginitis: Secondary | ICD-10-CM | POA: Diagnosis not present

## 2018-04-05 ENCOUNTER — Telehealth: Payer: Self-pay | Admitting: Family Medicine

## 2018-04-05 ENCOUNTER — Ambulatory Visit: Payer: PPO | Admitting: *Deleted

## 2018-04-05 DIAGNOSIS — R350 Frequency of micturition: Secondary | ICD-10-CM

## 2018-04-05 NOTE — Telephone Encounter (Signed)
See note   Copied from [place] [id number]. Topic: Referral - Request >> Apr 05, 2018  4:17 PM Cl[place], Laban Emperor wrote: Reason for CRM:  Pt states she would like a referral to see an urologist.

## 2018-04-06 NOTE — Telephone Encounter (Signed)
Fine to do 

## 2018-04-06 NOTE — Telephone Encounter (Signed)
Referral has been placed. Natalie Erickson is out of the office today so I will send to him to schedule when he gets back in office.

## 2018-04-06 NOTE — Telephone Encounter (Signed)
Please advise 

## 2018-04-18 NOTE — Telephone Encounter (Signed)
Referral sent to [facility]. No further action needed.

## 2018-04-24 ENCOUNTER — Ambulatory Visit: Payer: Self-pay

## 2018-04-24 NOTE — Telephone Encounter (Signed)
See note

## 2018-04-24 NOTE — Telephone Encounter (Signed)
Pt. Called to report finding a tick on left hip today, when she showered.  Was unsure how long it had been present.  Stated I am around my dog.  Reported able to remove tick; described as "small, brown, flat, with white spot."  Reported it was not filled with blood.  Stated there was a small red area on skin, at site of tick; denied itching.  Cleaned with H202 and applied antibiotic oint.  Is unsure when last Tetanus booster was given.  Questioned if she needed an antibiotic.  Appt. given with PCP 04/25/18 for eval.  Pt. agrees with plan.          Reason for Disposition . Tick bite with no complications  Answer Assessment - Initial Assessment Questions 1. TYPE of TICK: "Is it a wood tick or a deer tick?" If unsure, ask: "What size was the tick?" "Did it look more like a watermelon seed or a poppy seed?"      Small brown with white dot; starting to attach; easy to pull out; head still intact; tick still alive   2. LOCATION: "Where is the tick bite located?"      Left hip; small red spot after tick removed; cleaned with H2O2 and antibiotic ointment.  3. ONSET: "How long do you think the tick was attached before you removed it?" (Hours or days)      Unknown 4. TETANUS: "When was the last tetanus booster?"      unknown  5. PREGNANCY: "Is there any chance you are pregnant?" "When was your last menstrual period?"     No  Protocols used: TICK BITE-A-AH

## 2018-04-25 ENCOUNTER — Ambulatory Visit (INDEPENDENT_AMBULATORY_CARE_PROVIDER_SITE_OTHER): Payer: PPO | Admitting: Family Medicine

## 2018-04-25 ENCOUNTER — Encounter: Payer: Self-pay | Admitting: Family Medicine

## 2018-04-25 VITALS — BP 124/74 | HR 87 | Temp 98.7°F | Ht 62.0 in | Wt 135.2 lb

## 2018-04-25 DIAGNOSIS — S70262A Insect bite (nonvenomous), left hip, initial encounter: Secondary | ICD-10-CM

## 2018-04-25 DIAGNOSIS — W57XXXA Bitten or stung by nonvenomous insect and other nonvenomous arthropods, initial encounter: Secondary | ICD-10-CM | POA: Diagnosis not present

## 2018-04-25 DIAGNOSIS — R21 Rash and other nonspecific skin eruption: Secondary | ICD-10-CM | POA: Diagnosis not present

## 2018-04-25 NOTE — Telephone Encounter (Signed)
FYI

## 2018-04-25 NOTE — Progress Notes (Addendum)
Natalie Erickson is a 67 y.o. female is here for follow up.  History of Present Illness:   Natalie Erickson, CMA acting as scribe for Dr. Briscoe Deutscher.   HPI: Patient removed tic from left hip yesterday. She denies any rash. Does have red mark where it was removed and some tenderness in the area. No fever or chills.   There are no preventive care reminders to display for this patient.   Depression screen Natalie Erickson 2/9 04/25/2018 04/06/2017  Decreased Interest 0 0  Down, Depressed, Hopeless 0 0  PHQ - 2 Score 0 0  Altered sleeping 1 -  Tired, decreased energy 0 -  Change in appetite 0 -  Feeling bad or failure about yourself  0 -  Trouble concentrating 0 -  Moving slowly or fidgety/restless 0 -  Suicidal thoughts 0 -  PHQ-9 Score 1 -  Difficult doing work/chores Not difficult at all -   PMHx, SurgHx, SocialHx, FamHx, Medications, and Allergies were reviewed in the Visit Navigator and updated as appropriate.   Patient Active Problem List   Diagnosis Date Noted  . History of IBS 01/30/2018  . Urolithiasis 06/23/2017  . Fibromyalgia 06/04/2017  . Chronic fatigue 06/04/2017  . Medication intolerance 06/04/2017  . ANA positive 05/20/2017  . 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 Serum calcium elevated 05/20/2017  . Interstitial cystitis 05/20/2017  . Dyspepsia 05/20/2017  . Mixed hyperlipidemia 04/06/2017  . Elevated BP without diagnosis of hypertension 04/06/2017  . Anxiety 04/06/2017  . Dry eyes 04/06/2017   Social History   Tobacco Use  . Smoking status: Never Smoker  . Smokeless tobacco: Never Used  Substance Use Topics  . Alcohol use: No  . Drug use: No   Current Medications and Allergies:   .  acetaminophen (TYLENOL) 500 MG tablet, Take 1,000 mg by mouth every 6 (six) hours as needed for headache (pain)., Disp: , Rfl:  .  AMBULATORY NON FORMULARY MEDICATION, Magnesium oil Apply to legs as needed, Disp: , Rfl:  .  AMBULATORY NON FORMULARY MEDICATION, Medication Name: triphala  500 mg a day, Disp: , Rfl:  .  AMBULATORY NON FORMULARY MEDICATION, Medication Name: probiotic ultimate flora, Disp: , Rfl:  .  Calcium & Magnesium Carbonates (MYLANTA PO), Take 20 mLs by mouth as needed., Disp: , Rfl:  .  Cholecalciferol (VITAMIN D) 2000 units tablet, Take 2,000 Units by mouth daily. , Disp: , Rfl:  .  ciprofloxacin (CIPRO) 250 MG tablet, Take 1 tablet (250 mg total) by mouth 2 (two) times daily., Disp: 6 tablet, Rfl: 0 .  MAGNESIUM GLUCONATE PO, Take by mouth., Disp: , Rfl:  .  Meth-Hyo-M Bl-Na Phos-Ph Sal (URIBEL) 118 MG CAPS, One po qid prn dysuria., Disp: 20 capsule, Rfl: 0 .  NON FORMULARY, , Disp: , Rfl:  .  Omega-3 Fatty Acids (OMEGA-3 PO), Take 1 capsule by mouth daily., Disp: , Rfl:  .  Polyethyl Glycol-Propyl Glycol (SYSTANE ULTRA OP), Apply to eye 4 (four) times daily., Disp: , Rfl:  .  Simethicone (GAS-X PO), Take by mouth as needed., Disp: , Rfl:    Allergies  Allergen Reactions  . Statins Tinitus    Achy joints, muscle aches  . Banana Other (See Comments)    migraine  . Chocolate Other (See Comments)    migraine  . Codeine Nausea And Vomiting  . Cranberry Other (See Comments)    Cystitis  . Latex Rash  . Monosodium Glutamate Other (See Comments)    Migraine  .  Penicillins Rash    Has patient had a PCN reaction causing immediate rash, facial/tongue/throat swelling, SOB or lightheadedness with hypotension: Yes Has patient had a PCN reaction causing severe rash involving mucus membranes or skin necrosis: No Has patient had a PCN reaction that required hospitalization No Has patient had a PCN reaction occurring within the last 10 years: No If all of the above answers are "NO", then may proceed with Cephalosporin use.   Marland Kitchen Antihistamines, Loratadine-Type     Throat swelling  . Montelukast Other (See Comments)    Throat swelling  . Prednisone Swelling    Throat swelling (no difficulty breathing)  . Sulfites Other (See Comments)    Nausea and headaches   . Zofran [Ondansetron Hcl] Other (See Comments)    Muscle cramps, leg tingling  . Azithromycin Other (See Comments) and Diarrhea   Review of Systems   Pertinent items are noted in the HPI. Otherwise, ROS is negative.  Vitals:   Vitals:   04/25/18 0918  BP: 124/74  Pulse: 87  Temp: 98.7 F (37.1 C)  TempSrc: Oral  SpO2: 97%  Weight: 135 lb 3.2 oz (61.3 kg)  Height: 5\' 2"  (1.575 m)     Body mass index is 24.73 kg/m.   Physical Exam:   Physical Exam  Constitutional: She appears well-nourished.  HENT:  Head: Normocephalic and atraumatic.  Eyes: Pupils are equal, round, and reactive to light. EOM are normal.  Neck: Normal range of motion. Neck supple.  Cardiovascular: Normal rate, regular rhythm, normal heart sounds and intact distal pulses.  Pulmonary/Chest: Effort normal.  Abdominal: Soft.  Skin: Skin is warm. Rash noted.  Erythema at area of tick bite. No target lesions.  Psychiatric: She has a normal mood and affect. Her behavior is normal.  Nursing note and vitals reviewed.   Assessment and Plan:   Natalie Erickson was seen today for tick removal.  Diagnoses and all orders for this visit:  Tick bite of left hip, initial encounter  Tick bite, initial encounter Comments: Patient does not tolerate medications.  We discussed the signs of tickborne illnesses.  Okay lab visit in 3 weeks if symptomatic.  Rash Comments: No concerns today.   . Reviewed expectations re: course of current medical issues. . Discussed self-management of symptoms. . Outlined signs and symptoms indicating need for more acute intervention. . Patient verbalized understanding and all questions were answered. Marland Kitchen Health Maintenance issues including appropriate healthy diet, exercise, and smoking avoidance were discussed with patient. . See orders for this visit as documented in the electronic medical record. . Patient received an After Visit Summary.  Briscoe Deutscher, DO [place] [place]  [place] 05/20/2018  Future Appointments  Date Time Provider [place]  05/29/2018 10:00 AM Williemae Area, RN [facility]

## 2018-04-25 NOTE — Patient Instructions (Addendum)
Call lin 1-3 weeks if you have fever, pain or fatigue.

## 2018-05-15 ENCOUNTER — Telehealth: Payer: Self-pay | Admitting: Physician Assistant

## 2018-05-15 ENCOUNTER — Other Ambulatory Visit: Payer: Self-pay

## 2018-05-15 DIAGNOSIS — N281 Cyst of kidney, acquired: Secondary | ICD-10-CM

## 2018-05-16 NOTE — Telephone Encounter (Signed)
Please order MRI with  Contrast of right kidney - follow up  Right renal lesion noted 2018  With one year follow up reccommended

## 2018-05-16 NOTE — Telephone Encounter (Signed)
I spoke with the patient.  She does not want a CT and she does not want to ask her PCP. She wants an MRI and she wants it ordered by GI. The CT "made me feel bad for days" and "it was too invasive." Please advise. She is a patient of Dr Juleen China. I can reach out to her nurse if you want.

## 2018-05-16 NOTE — Telephone Encounter (Signed)
Left a message to call back. Last MRI was done w/o contrast. It was done at [facility].

## 2018-05-16 NOTE — Telephone Encounter (Signed)
Called to patient. She needs a repeat CT for follow up of a renal cyst. This was to be ordered by her PCP. Left message for the patient to call.

## 2018-05-29 ENCOUNTER — Ambulatory Visit (INDEPENDENT_AMBULATORY_CARE_PROVIDER_SITE_OTHER): Payer: PPO | Admitting: *Deleted

## 2018-05-29 ENCOUNTER — Encounter: Payer: Self-pay | Admitting: *Deleted

## 2018-05-29 VITALS — BP 116/72 | HR 74 | Resp 16 | Ht 62.0 in | Wt 139.4 lb

## 2018-05-29 DIAGNOSIS — Z0000 Encounter for general adult medical examination without abnormal findings: Secondary | ICD-10-CM

## 2018-05-29 NOTE — Patient Instructions (Addendum)
Natalie Erickson ,  Bring a copy of your living will and/or healthcare power of attorney to your next office visit.  Thank you for taking time to come for your Medicare Wellness Visit. I appreciate your ongoing commitment to your health goals. Please review the following plan we discussed and let me know if I can assist you in the future.   These are the goals we discussed: Goals    . Increase energy       This is a list of the screening recommended for you and due dates:  Health Maintenance  Topic Date Due  . Flu Shot  07/26/2018  . Mammogram  08/30/2019  . Colon Cancer Screening  05/13/2027  . DEXA scan (bone density measurement)  Completed  .  Hepatitis C: One time screening is recommended by Center for Disease Control  (CDC) for  adults born from [birth year] through 1965.   Completed  . Tetanus Vaccine  Discontinued  . Pneumonia vaccines  Discontinued   Preventive Care for Adults  A healthy lifestyle and preventive care can promote health and wellness. Preventive health guidelines for adults include the following key practices.  . A routine yearly physical is a good way to check with your health care provider about your health and preventive screening. It is a chance to share any concerns and updates on your health and to receive a thorough exam.  . Visit your dentist for a routine exam and preventive care every 6 months. Brush your teeth twice a day and floss once a day. Good oral hygiene prevents tooth decay and gum disease.  . The frequency of eye exams is based on your age, health, family medical history, use  of contact lenses, and other factors. Follow your health care provider's recommendations for frequency of eye exams.  . Eat a healthy diet. Foods like vegetables, fruits, whole grains, low-fat dairy products, and lean protein foods contain the nutrients you need without too many calories. Decrease your intake of foods high in solid fats, added sugars, and salt. Eat the right amount  of calories for you. Get information about a proper diet from your health care provider, if necessary.  . Regular physical exercise is one of the most important things you can do for your health. Most adults should get at least 150 minutes of moderate-intensity exercise (any activity that increases your heart rate and causes you to sweat) each week. In addition, most adults need muscle-strengthening exercises on 2 or more days a week.  Silver Sneakers may be a benefit available to you. To determine eligibility, you may visit the website: www.silversneakers.com or contact program at (708)094-3170 Mon-Fri between 8AM-8PM.   . Maintain a healthy weight. The body mass index (BMI) is a screening tool to identify possible weight problems. It provides an estimate of body fat based on height and weight. Your health care provider can find your BMI and can help you achieve or maintain a healthy weight.   For adults 20 years and older: ? A BMI below 18.5 is considered underweight. ? A BMI of 18.5 to 24.9 is normal. ? A BMI of 25 to 29.9 is considered overweight. ? A BMI of 30 and above is considered obese.   . Maintain normal blood lipids and cholesterol levels by exercising and minimizing your intake of saturated fat. Eat a balanced diet with plenty of fruit and vegetables. Blood tests for lipids and cholesterol should begin at age 41 and be repeated every 5 years. If  your lipid or cholesterol levels are high, you are over 50, or you are at high risk for heart disease, you may need your cholesterol levels checked more frequently. Ongoing high lipid and cholesterol levels should be treated with medicines if diet and exercise are not working.  . If you smoke, find out from your health care provider how to quit. If you do not use tobacco, please do not start.  . If you choose to drink alcohol, please do not consume more than 2 drinks per day. One drink is considered to be 12 ounces (355 mL) of beer, 5 ounces  (148 mL) of wine, or 1.5 ounces (44 mL) of liquor.  . If you are 45-4 years old, ask your health care provider if you should take aspirin to prevent strokes.  . Use sunscreen. Apply sunscreen liberally and repeatedly throughout the day. You should seek shade when your shadow is shorter than you. Protect yourself by wearing long sleeves, pants, a wide-brimmed hat, and sunglasses year round, whenever you are outdoors.  . Once a month, do a whole body skin exam, using a mirror to look at the skin on your back. Tell your health care provider of new moles, moles that have irregular borders, moles that are larger than a pencil eraser, or moles that have changed in shape or color.

## 2018-05-29 NOTE — Progress Notes (Signed)
PCP notes:   Health maintenance: Up to date.    Abnormal screenings: None.   Patient concerns: Patient states that she has a cyst on her kidney, seen during a CT scan during an ED visit. She has also had an MRI. She states she has not followed up on these yet. Visit scheduled with Dr Juleen China 06/04/18.    Nurse concerns: None.   Next PCP appt: 06/04/18 7:40.

## 2018-05-29 NOTE — Progress Notes (Signed)
I have personally reviewed the Medicare Annual Wellness questionnaire and have noted 1. The patient's medical and social history 2. Their use of alcohol, tobacco or illicit drugs 3. Their current medications and supplements 4. The patient's functional ability including ADL's, fall risks, home safety risks and hearing or visual impairment. 5. Diet and physical activities 6. Evidence for depression or mood disorders 7. Reviewed Updated provider list, see scanned forms and [facility] Snapshot.   The patients weight, height, BMI and visual acuity have been recorded in the chart I have made referrals, counseling and provided education to the patient based review of the above and I have provided the pt with a written personalized care plan for preventive services.  I have provided the patient with a copy of your personalized plan for preventive services. Instructed to take the time to review along with their updated medication list.  Natalie Erickson  

## 2018-05-29 NOTE — Progress Notes (Signed)
Subjective:   Natalie Erickson is a 67 y.o. female who presents for an Initial Medicare Annual Wellness Visit.  Lives in single family home with son and grandkids. Two story home. No problems with stairs.   Review of Systems    No ROS.  Medicare Wellness Visit. Additional risk factors are reflected in the social history.  Cardiac Risk Factors include: advanced age (>6men, >37 women)     Objective:    Today's Vitals   05/29/18 1030  BP: 116/72  Pulse: 74  Resp: 16  SpO2: 98%  Weight: 139 lb 6.4 oz (63.2 kg)  Height: 5\' 2"  (1.575 m)   Body mass index is 25.5 kg/m.  Advanced Directives 05/29/2018 06/07/2017 05/27/2017 05/03/2017  Does Patient Have a Medical Advance Directive? Yes No No No  Type of Paramedic of [place];Living will - - -  Does patient want to make changes to medical advance directive? No - Patient declined - - -  Copy of [place] in Chart? No - copy requested - - -  Would patient like information on creating a medical advance directive? - No - Patient declined - -    Current Medications (verified) Outpatient Encounter Medications as of 05/29/2018  Medication Sig  . acetaminophen (TYLENOL) 500 MG tablet Take 1,000 mg by mouth every 6 (six) hours as needed for headache (pain).  . AMBULATORY NON FORMULARY MEDICATION Magnesium oil Apply to legs as needed  . AMBULATORY NON FORMULARY MEDICATION Medication Name: triphala 500 mg a day  . AMBULATORY NON FORMULARY MEDICATION Medication Name: probiotic ultimate flora  . Calcium & Magnesium Carbonates (MYLANTA PO) Take 20 mLs by mouth as needed.  . Cholecalciferol (VITAMIN D) 2000 units tablet Take 2,000 Units by mouth daily.   Marland Kitchen MAGNESIUM GLUCONATE PO Take by mouth.  . Meth-Hyo-M Bl-Na Phos-Ph Sal (URIBEL) 118 MG CAPS One po qid prn dysuria.  . NON FORMULARY   . Polyethyl Glycol-Propyl Glycol (SYSTANE ULTRA OP) Apply to eye 4 (four) times daily.  . Simethicone (GAS-X PO) Take by  mouth as needed.   No facility-administered encounter medications on file as of 05/29/2018.     Allergies (verified) Statins; Banana; Chocolate; Codeine; Cranberry; Latex; Monosodium glutamate; Penicillins; Antihistamines, loratadine-type; Montelukast; Prednisone; Sulfites; Zofran [ondansetron hcl]; and Azithromycin   History: Past Medical History:  Diagnosis Date  . Anxiety 04/06/2017  . Cystitis   . Depression   . Dry eyes 04/06/2017  . Elevated BP without diagnosis of hypertension 04/06/2017  . Fibromyalgia   . Hiatal hernia   . History of colon polyps   . IBS (irritable bowel syndrome)   . Mixed hyperlipidemia 04/06/2017  . TMJ (dislocation of temporomandibular joint)   . Vasomotor rhinitis    Past Surgical History:  Procedure Laterality Date  . APPENDECTOMY  1959  . LAPAROSCOPY     Family History  Problem Relation Age of Onset  . Breast cancer Sister   . Diabetes Paternal Grandmother   . Hypertension Mother   . Lung cancer Father   . Colon cancer Neg Hx   . Stomach cancer Neg Hx    Social History   Socioeconomic History  . Marital status: Married    Spouse name: Not on file  . Number of children: 2  . Years of education: Therapist, sports  . Highest education level: Not on file  Occupational History  . Occupation: Retired  Scientific laboratory technician  . Financial resource strain: Not on file  . Food insecurity:  Worry: Not on file    Inability: Not on file  . Transportation needs:    Medical: Not on file    Non-medical: Not on file  Tobacco Use  . Smoking status: Never Smoker  . Smokeless tobacco: Never Used  Substance and Sexual Activity  . Alcohol use: No  . Drug use: No  . Sexual activity: Yes  Lifestyle  . Physical activity:    Days per week: Not on file    Minutes per session: Not on file  . Stress: Not on file  Relationships  . Social connections:    Talks on phone: Not on file    Gets together: Not on file    Attends religious service: Not on file    Active member  of club or organization: Not on file    Attends meetings of clubs or organizations: Not on file    Relationship status: Not on file  Other Topics Concern  . Not on file  Social History Narrative   Lives at home w/ her husband and family   Right-handed   Caffeine: none since March 2018    Tobacco Counseling Counseling given: Not Answered  Activities of Daily Living In your present state of health, do you have any difficulty performing the following activities: 05/29/2018  Hearing? N  Vision? N  Difficulty concentrating or making decisions? N  Walking or climbing stairs? N  Dressing or bathing? N  Doing errands, shopping? N  Preparing Food and eating ? N  Using the Toilet? N  In the past six months, have you accidently leaked urine? N  Do you have problems with loss of bowel control? N  Managing your Medications? N  Managing your Finances? N  Some recent data might be hidden     Immunizations and Health Maintenance Immunization History  Administered Date(s) Administered  . Influenza, High Dose Seasonal PF 10/05/2017   There are no preventive care reminders to display for this patient.  Patient Care Team: Briscoe Deutscher, DO as PCP - General (Family Medicine) Loletha Carrow Kirke Corin, MD as Consulting Physician (Gastroenterology) Murrell Redden Earlyne Iba, MD as Consulting Physician (Obstetrics and Gynecology)  Indicate any recent Medical Services you may have received from other than [facility] providers in the past year (date may be approximate).     Assessment:   This is a routine wellness examination for Natalie Erickson.  Hearing/Vision screen  Hearing Screening   125Hz  250Hz  500Hz  1000Hz  2000Hz  3000Hz  4000Hz  6000Hz  8000Hz   Right ear:   40 40 40  40    Left ear:   0 40 40  0    Vision Screening Comments: Goes yearly. Wears bifocals. PVDs bilateral. Dr Kathlen Mody.  Dietary issues and exercise activities discussed: Current Exercise Habits: Structured exercise class, Type of exercise: Other -  see comments;yoga(Thai Chi, yoga, silver sneakers), Time (Minutes): 60, Frequency (Times/Week): 5, Weekly Exercise (Minutes/Week): 300, Intensity: Moderate, Exercise limited by: None identified  Patient states that she is on a strict diet now and avoids red meat.   Goals    . Increase energy      Depression Screen PHQ 2/9 Scores 04/25/2018 04/06/2017  PHQ - 2 Score 0 0  PHQ- 9 Score 1 -    Fall Risk Fall Risk  04/25/2018 04/06/2017  Falls in the past year? No No   Cognitive Function: Ad8 score reviewed for issues:  Issues making decisions:no  Less interest in hobbies / activities:no  Repeats questions, stories (family complaining):no  Trouble using ordinary  gadgets (microwave, computer, phone):no  Forgets the month or year: no  Mismanaging finances: no  Remembering appts:no  Daily problems with thinking and/or memory:no Ad8 score is=0 Does Sudoku, puzzles intermittently, reading.         Screening Tests Health Maintenance  Topic Date Due  . INFLUENZA VACCINE  07/26/2018  . MAMMOGRAM  08/30/2019  . COLONOSCOPY  05/13/2027  . DEXA SCAN  Completed  . Hepatitis C Screening  Completed  . TETANUS/TDAP  Discontinued  . PNA vac Low Risk Adult  Discontinued     Plan:   Follow up with PCP as directed.  I have personally reviewed and noted the following in the patient's chart:   . Medical and social history . Use of alcohol, tobacco or illicit drugs  . Current medications and supplements . Functional ability and status . Nutritional status . Physical activity . Advanced directives . List of other physicians . Vitals . Screenings to include cognitive, depression, and falls . Referrals and appointments  In addition, I have reviewed and discussed with patient certain preventive protocols, quality metrics, and best practice recommendations. A written personalized care plan for preventive services as well as general preventive health recommendations were provided to  patient.     Williemae Area, RN   05/29/2018

## 2018-06-03 NOTE — Progress Notes (Signed)
Natalie Erickson is a 67 y.o. female is here for follow up.  History of Present Illness:   HPI:    05/03/17 CT with contrast:  IMPRESSION: 1. Bilateral nonobstructing renal stones, left greater than right. 2. Potential hypoenhancing 9 mm lesion interpolar right kidney. MRI recommended to further evaluate. 3. Abdominal aortic atherosclerosis.  MRI WITHOUT CONTRAST 08/17/17 IMPRESSION: 1. Examination is limited without contrast. Patient refused contrast. 2. Small bilateral renal cysts. 3. The small right interpolar lesion in question on the CT scan appears to have increased T1 signal intensity and is most likely a small hemorrhagic lesion. Recommend followup pre and post-contrast abdominal CT scan in May 2019 which would be a 1 year followup from the CT scan. 4. No acute or other significant findings in the abdomen.  There are no preventive care reminders to display for this patient. Depression screen [facility] 2/9 04/25/2018 04/06/2017  Decreased Interest 0 0  Down, Depressed, Hopeless 0 0  PHQ - 2 Score 0 0  Altered sleeping 1 -  Tired, decreased energy 0 -  Change in appetite 0 -  Feeling bad or failure about yourself  0 -  Trouble concentrating 0 -  Moving slowly or fidgety/restless 0 -  Suicidal thoughts 0 -  PHQ-9 Score 1 -  Difficult doing work/chores Not difficult at all -   PMHx, SurgHx, SocialHx, FamHx, Medications, and Allergies were reviewed in the Visit Navigator and updated as appropriate.   Patient Active Problem List   Diagnosis Date Noted  . History of IBS 01/30/2018  . Urolithiasis 06/23/2017  . Fibromyalgia 06/04/2017  . Chronic fatigue 06/04/2017  . Medication intolerance 06/04/2017  . ANA positive 05/20/2017  . 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 Serum calcium elevated 05/20/2017  . Interstitial cystitis 05/20/2017  . Dyspepsia 05/20/2017  . Mixed hyperlipidemia 04/06/2017  . Elevated BP without diagnosis of hypertension 04/06/2017  . Anxiety 04/06/2017  .  Dry eyes 04/06/2017   Social History   Tobacco Use  . Smoking status: Never Smoker  . Smokeless tobacco: Never Used  Substance Use Topics  . Alcohol use: No  . Drug use: No   Current Medications and Allergies:   Current Outpatient Medications:  .  acetaminophen (TYLENOL) 500 MG tablet, Take 1,000 mg by mouth every 6 (six) hours as needed for headache (pain)., Disp: , Rfl:  .  AMBULATORY NON FORMULARY MEDICATION, Magnesium oil Apply to legs as needed, Disp: , Rfl:  .  AMBULATORY NON FORMULARY MEDICATION, Medication Name: triphala 500 mg a day, Disp: , Rfl:  .  AMBULATORY NON FORMULARY MEDICATION, Medication Name: probiotic ultimate flora, Disp: , Rfl:  .  Calcium & Magnesium Carbonates (MYLANTA PO), Take 20 mLs by mouth as needed., Disp: , Rfl:  .  Cholecalciferol (VITAMIN D) 2000 units tablet, Take 2,000 Units by mouth daily. , Disp: , Rfl:  .  MAGNESIUM GLUCONATE PO, Take by mouth., Disp: , Rfl:  .  Meth-Hyo-M Bl-Na Phos-Ph Sal (URIBEL) 118 MG CAPS, One po qid prn dysuria., Disp: 20 capsule, Rfl: 0 .  NON FORMULARY, , Disp: , Rfl:  .  Polyethyl Glycol-Propyl Glycol (SYSTANE ULTRA OP), Apply to eye 4 (four) times daily., Disp: , Rfl:  .  Simethicone (GAS-X PO), Take by mouth as needed., Disp: , Rfl:    Allergies  Allergen Reactions  . Statins Tinitus    Achy joints, muscle aches  . Banana Other (See Comments)    migraine  . Chocolate Other (See Comments)  migraine  . Codeine Nausea And Vomiting  . Cranberry Other (See Comments)    Cystitis  . Latex Rash  . Monosodium Glutamate Other (See Comments)    Migraine  . Penicillins Rash    Has patient had a PCN reaction causing immediate rash, facial/tongue/throat swelling, SOB or lightheadedness with hypotension: Yes Has patient had a PCN reaction causing severe rash involving mucus membranes or skin necrosis: No Has patient had a PCN reaction that required hospitalization No Has patient had a PCN reaction occurring within the  last 10 years: No If all of the above answers are "NO", then may proceed with Cephalosporin use.   Marland Kitchen Antihistamines, Loratadine-Type     Throat swelling  . Montelukast Other (See Comments)    Throat swelling  . Prednisone Swelling    Throat swelling (no difficulty breathing)  . Sulfites Other (See Comments)    Nausea and headaches  . Zofran [Ondansetron Hcl] Other (See Comments)    Muscle cramps, leg tingling  . Azithromycin Other (See Comments) and Diarrhea   Review of Systems   Pertinent items are noted in the HPI. Otherwise, ROS is negative.  Vitals:   Vitals:   06/04/18 0734  BP: 120/76  Pulse: 69  Temp: 98.1 F (36.7 C)  TempSrc: Oral  SpO2: 99%  Weight: 139 lb (63 kg)  Height: 5\' 2"  (1.575 m)     Body mass index is 25.42 kg/m.  Physical Exam:   Physical Exam  Constitutional: She appears well-nourished.  HENT:  Head: Normocephalic and atraumatic.  Eyes: Pupils are equal, round, and reactive to light. EOM are normal.  Neck: Normal range of motion. Neck supple.  Cardiovascular: Normal rate, regular rhythm, normal heart sounds and intact distal pulses.  Pulmonary/Chest: Effort normal.  Abdominal: Soft.  Skin: Skin is warm.  Psychiatric: She has a normal mood and affect. Her behavior is normal.  Nursing note and vitals reviewed.  Assessment and Plan:   Amarilys was seen today for follow-up.  Diagnoses and all orders for this visit:  Chronic fatigue -     Comprehensive metabolic panel -     CBC with Differential/Platelet  Low vitamin D level -     VITAMIN D 25 Hydroxy (Vit-D Deficiency, Fractures)  Screening for lipid disorders -     Lipid panel  Acquired cyst of kidney -     CT Abdomen Pelvis W Contrast; Future FOR FOLLOW UP IMAGING   . Reviewed expectations re: course of current medical issues. . Discussed self-management of symptoms. . Outlined signs and symptoms indicating need for more acute intervention. . Patient verbalized understanding  and all questions were answered. Marland Kitchen Health Maintenance issues including appropriate healthy diet, exercise, and smoking avoidance were discussed with patient. . See orders for this visit as documented in the electronic medical record. . Patient received an After Visit Summary.  Briscoe Deutscher, DO [place], [place] [facility] 06/04/2018

## 2018-06-04 ENCOUNTER — Ambulatory Visit (INDEPENDENT_AMBULATORY_CARE_PROVIDER_SITE_OTHER): Payer: PPO | Admitting: Family Medicine

## 2018-06-04 ENCOUNTER — Encounter: Payer: Self-pay | Admitting: Family Medicine

## 2018-06-04 VITALS — BP 120/76 | HR 69 | Temp 98.1°F | Ht 62.0 in | Wt 139.0 lb

## 2018-06-04 DIAGNOSIS — N281 Cyst of kidney, acquired: Secondary | ICD-10-CM

## 2018-06-04 DIAGNOSIS — R7989 Other specified abnormal findings of blood chemistry: Secondary | ICD-10-CM

## 2018-06-04 DIAGNOSIS — M797 Fibromyalgia: Secondary | ICD-10-CM | POA: Diagnosis not present

## 2018-06-04 DIAGNOSIS — R5382 Chronic fatigue, unspecified: Secondary | ICD-10-CM

## 2018-06-04 DIAGNOSIS — R1084 Generalized abdominal pain: Secondary | ICD-10-CM

## 2018-06-04 DIAGNOSIS — Z13220 Encounter for screening for lipoid disorders: Secondary | ICD-10-CM | POA: Diagnosis not present

## 2018-06-04 LAB — CBC WITH DIFFERENTIAL/PLATELET
Basophils Absolute: 0 10*3/uL (ref 0.0–0.1)
Basophils Relative: 0.6 % (ref 0.0–3.0)
Eosinophils Absolute: 0 10*3/uL (ref 0.0–0.7)
Eosinophils Relative: 0.2 % (ref 0.0–5.0)
HCT: 39 % (ref 36.0–46.0)
Hemoglobin: 13.4 g/dL (ref 12.0–15.0)
Lymphocytes Relative: 22.9 % (ref 12.0–46.0)
Lymphs Abs: 2 10*3/uL (ref 0.7–4.0)
MCHC: 34.5 g/dL (ref 30.0–36.0)
MCV: 92 fl (ref 78.0–100.0)
Monocytes Absolute: 0.5 10*3/uL (ref 0.1–1.0)
Monocytes Relative: 5.9 % (ref 3.0–12.0)
Neutro Abs: 6.3 10*3/uL (ref 1.4–7.7)
Neutrophils Relative %: 70.4 % (ref 43.0–77.0)
Platelets: 360 10*3/uL (ref 150.0–400.0)
RBC: 4.23 Mil/uL (ref 3.87–5.11)
RDW: 12.7 % (ref 11.5–15.5)
WBC: 8.9 10*3/uL (ref 4.0–10.5)

## 2018-06-04 LAB — LIPID PANEL
Cholesterol: 228 mg/dL — ABNORMAL HIGH (ref 0–200)
HDL: 61.3 mg/dL (ref >39.00)
LDL Cholesterol: 152 mg/dL — ABNORMAL HIGH (ref 0–99)
NonHDL: 166.49
Total CHOL/HDL Ratio: 4
Triglycerides: 73 mg/dL (ref 0.0–149.0)
VLDL: 14.6 mg/dL (ref 0.0–40.0)

## 2018-06-04 LAB — COMPREHENSIVE METABOLIC PANEL
ALT: 13 U/L (ref 0–35)
AST: 15 U/L (ref 0–37)
Albumin: 4.3 g/dL (ref 3.5–5.2)
Alkaline Phosphatase: 131 U/L — ABNORMAL HIGH (ref 39–117)
BUN: 21 mg/dL (ref 6–23)
CO2: 29 mEq/L (ref 19–32)
Calcium: 9.9 mg/dL (ref 8.4–10.5)
Chloride: 104 mEq/L (ref 96–112)
Creatinine, Ser: 0.68 mg/dL (ref 0.40–1.20)
GFR: 91.86 mL/min (ref >60.00)
Glucose, Bld: 90 mg/dL (ref 70–99)
Potassium: 4 mEq/L (ref 3.5–5.1)
Sodium: 141 mEq/L (ref 135–145)
Total Bilirubin: 0.5 mg/dL (ref 0.2–1.2)
Total Protein: 7.4 g/dL (ref 6.0–8.3)

## 2018-06-04 LAB — VITAMIN D 25 HYDROXY (VIT D DEFICIENCY, FRACTURES): VITD: 37.72 ng/mL (ref 30.00–100.00)

## 2018-06-05 ENCOUNTER — Other Ambulatory Visit: Payer: Self-pay

## 2018-06-05 DIAGNOSIS — R899 Unspecified abnormal finding in specimens from other organs, systems and tissues: Secondary | ICD-10-CM

## 2018-06-05 NOTE — Telephone Encounter (Signed)
Patient has requested this through her PCP.

## 2018-06-06 DIAGNOSIS — M9901 Segmental and somatic dysfunction of cervical region: Secondary | ICD-10-CM | POA: Diagnosis not present

## 2018-06-06 DIAGNOSIS — M9902 Segmental and somatic dysfunction of thoracic region: Secondary | ICD-10-CM | POA: Diagnosis not present

## 2018-06-06 DIAGNOSIS — M791 Myalgia, unspecified site: Secondary | ICD-10-CM | POA: Diagnosis not present

## 2018-06-14 ENCOUNTER — Ambulatory Visit: Payer: Self-pay | Admitting: Family Medicine

## 2018-06-14 DIAGNOSIS — M791 Myalgia, unspecified site: Secondary | ICD-10-CM | POA: Diagnosis not present

## 2018-06-14 DIAGNOSIS — M9901 Segmental and somatic dysfunction of cervical region: Secondary | ICD-10-CM | POA: Diagnosis not present

## 2018-06-14 DIAGNOSIS — M9902 Segmental and somatic dysfunction of thoracic region: Secondary | ICD-10-CM | POA: Diagnosis not present

## 2018-06-14 NOTE — Telephone Encounter (Signed)
Returned call to pt.  Reported she rec'd a bite on upper right shoulder 2 weeks ago.  Stated the bite area looked like a red dot, and was surrounded by smaller area of raised red rash. Reported the initial rash has cleared, and a new rash has  developed below the original site and closer to right axilla.  Today, reported the bite area has a raised "red dot", and the rash, below this, is an area of "pinkish-red raised dots."  Denied any blistering or drainage.  C/o itching and intermittent pain. Has used Calamine with antihistamine, and Bacitracin Oint.  Denied fever/ chills.  Denied any signs of rash on face or near mouth.  Denied any tongue swelling or difficulty with swallowing.  Appt. Sched. 6/21 @ 4:00 PM with PCP.  Care advice given per protocol.  Verb. Understanding and agrees with plan.          Reason for Disposition . [1] SEVERE local itching (i.e., interferes with work, school, sleep) AND [2] not improved after 24 hours of hydrocortisone cream  Answer Assessment - Initial Assessment Questions 1. TYPE of INSECT: "What type of insect was it?"      unknown 2. ONSET: "When did you get bitten?"      2 weeks ago 3. LOCATION: "Where is the insect bite located?"      Right shoulder 4. REDNESS: "Is the area red or pink?" If so, ask "What size is area of redness?" (inches or cm). "When did the redness start?"     Very pinkish red  5. PAIN: "Is there any pain?" If so, ask: "How bad is it?"  (Scale 1-10; or mild, moderate, severe)     Intermittent pain  6. ITCHING: "Does it itch?" If so, ask: "How bad is the itch?"    - MILD: doesn't interfere with normal activities   - MODERATE-SEVERE: interferes with work, school, sleep, or other activities      mild 7. SWELLING: "How big is the swelling?" (inches, cm, or compare to coins)     Only red bumps 8. OTHER SYMPTOMS: "Do you have any other symptoms?"  (e.g., difficulty breathing, hives)     Rash below the original bite; raised red dots in an area  beneath the original bite; denied fever, denied blistering or dnge.  Protocols used: INSECT BITE-A-AH Message from Scherrie Gerlach sent at 06/14/2018 8:05 AM EDT   Summary: poss bite   Pt states she got bit about 2 wks ago, right upper back. At first very painful. Not so much now, sore and itching. Pt had a rash that started near the bite, but that is gone and now the rash has started in another area. Pt wants to know if she should come in and get it checked. Doesn't want to make an appt if she doesn't need to.

## 2018-06-15 ENCOUNTER — Ambulatory Visit (INDEPENDENT_AMBULATORY_CARE_PROVIDER_SITE_OTHER): Payer: PPO | Admitting: Family Medicine

## 2018-06-15 ENCOUNTER — Encounter: Payer: Self-pay | Admitting: Family Medicine

## 2018-06-15 VITALS — BP 120/76 | HR 101 | Temp 98.3°F | Ht 62.0 in | Wt 138.0 lb

## 2018-06-15 DIAGNOSIS — R21 Rash and other nonspecific skin eruption: Secondary | ICD-10-CM | POA: Diagnosis not present

## 2018-06-15 MED ORDER — MUPIROCIN 2 % EX OINT
TOPICAL_OINTMENT | CUTANEOUS | 0 refills | Status: DC
Start: 2018-06-15 — End: 2018-06-29

## 2018-06-15 MED ORDER — TRIAMCINOLONE ACETONIDE 0.5 % EX OINT: 1.0000 "application " | TOPICAL_OINTMENT | Freq: Two times a day (BID) | CUTANEOUS | 0 refills | Status: DC

## 2018-06-17 NOTE — Progress Notes (Signed)
Natalie Erickson is a 67 y.o. female here for an acute visit.  History of Present Illness:   HPI:  Rash on right upper back x a few days after a bug bite. Working outside. Itchy. No pain or paresthesias in the area. Has tried hydrocortisone and benzoyl peroxide. No systemic symptoms.   PMHx, SurgHx, SocialHx, Medications, and Allergies were reviewed in the Visit Navigator and updated as appropriate.  Current Medications:   Current Outpatient Medications:  .  acetaminophen (TYLENOL) 500 MG tablet, Take 1,000 mg by mouth every 6 (six) hours as needed for headache (pain)., Disp: , Rfl:  .  AMBULATORY NON FORMULARY MEDICATION, Magnesium oil Apply to legs as needed, Disp: , Rfl:  .  AMBULATORY NON FORMULARY MEDICATION, Medication Name: triphala 500 mg a day, Disp: , Rfl:  .  AMBULATORY NON FORMULARY MEDICATION, Medication Name: probiotic ultimate flora, Disp: , Rfl:  .  Calcium & Magnesium Carbonates (MYLANTA PO), Take 20 mLs by mouth as needed., Disp: , Rfl:  .  Cholecalciferol (VITAMIN D) 2000 units tablet, Take 2,000 Units by mouth daily. , Disp: , Rfl:  .  MAGNESIUM GLUCONATE PO, Take by mouth., Disp: , Rfl:  .  Meth-Hyo-M Bl-Na Phos-Ph Sal (URIBEL) 118 MG CAPS, One po qid prn dysuria., Disp: 20 capsule, Rfl: 0 .  NON FORMULARY, , Disp: , Rfl:  .  Polyethyl Glycol-Propyl Glycol (SYSTANE ULTRA OP), Apply to eye 4 (four) times daily., Disp: , Rfl:  .  Simethicone (GAS-X PO), Take by mouth as needed., Disp: , Rfl:   Allergies  Allergen Reactions  . Statins Tinitus    Achy joints, muscle aches  . Banana Other (See Comments)    migraine  . Chocolate Other (See Comments)    migraine  . Codeine Nausea And Vomiting  . Cranberry Other (See Comments)    Cystitis  . Latex Rash  . Monosodium Glutamate Other (See Comments)    Migraine  . Penicillins Rash    Has patient had a PCN reaction causing immediate rash, facial/tongue/throat swelling, SOB or lightheadedness with hypotension: Yes Has  patient had a PCN reaction causing severe rash involving mucus membranes or skin necrosis: No Has patient had a PCN reaction that required hospitalization No Has patient had a PCN reaction occurring within the last 10 years: No If all of the above answers are "NO", then may proceed with Cephalosporin use.   Marland Kitchen Antihistamines, Loratadine-Type     Throat swelling  . Montelukast Other (See Comments)    Throat swelling  . Prednisone Swelling    Throat swelling (no difficulty breathing)  . Sulfites Other (See Comments)    Nausea and headaches  . Zofran [Ondansetron Hcl] Other (See Comments)    Muscle cramps, leg tingling  . Azithromycin Other (See Comments) and Diarrhea   Review of Systems:   Pertinent items are noted in the HPI. Otherwise, ROS is negative.  Vitals:   Vitals:   06/15/18 1557  BP: 120/76  Pulse: (!) 101  Temp: 98.3 F (36.8 C)  TempSrc: Oral  SpO2: 98%  Weight: 138 lb (62.6 kg)  Height: 5\' 2"  (1.575 m)     Body mass index is 25.24 kg/m.  Physical Exam:   Physical Exam  Constitutional: She appears well-nourished.  HENT:  Head: Normocephalic and atraumatic.  Eyes: Pupils are equal, round, and reactive to light. EOM are normal.  Neck: Normal range of motion. Neck supple.  Cardiovascular: Normal rate, regular rhythm, normal heart sounds and intact  distal pulses.  Pulmonary/Chest: Effort normal.  Abdominal: Soft.  Skin: Skin is warm.  Erythematous papular rash right upper back, not in dermatomal distribution.  Psychiatric: She has a normal mood and affect. Her behavior is normal.  Nursing note and vitals reviewed.    Results for orders placed or performed in visit on 06/04/18  Comprehensive metabolic panel  Result Value Ref Range   Sodium 141 135 - 145 mEq/L   Potassium 4.0 3.5 - 5.1 mEq/L   Chloride 104 96 - 112 mEq/L   CO2 29 19 - 32 mEq/L   Glucose, Bld 90 70 - 99 mg/dL   BUN 21 6 - 23 mg/dL   Creatinine, Ser 0.68 0.40 - 1.20 mg/dL   Total  Bilirubin 0.5 0.2 - 1.2 mg/dL   Alkaline Phosphatase 131 (H) 39 - 117 U/L   AST 15 0 - 37 U/L   ALT 13 0 - 35 U/L   Total Protein 7.4 6.0 - 8.3 g/dL   Albumin 4.3 3.5 - 5.2 g/dL   Calcium 9.9 8.4 - 10.5 mg/dL   GFR 91.86 >60.00 mL/min  CBC with Differential/Platelet  Result Value Ref Range   WBC 8.9 4.0 - 10.5 K/uL   RBC 4.23 3.87 - 5.11 Mil/uL   Hemoglobin 13.4 12.0 - 15.0 g/dL   HCT 39.0 36.0 - 46.0 %   MCV 92.0 78.0 - 100.0 fl   MCHC 34.5 30.0 - 36.0 g/dL   RDW 12.7 11.5 - 15.5 %   Platelets 360.0 150.0 - 400.0 K/uL   Neutrophils Relative % 70.4 43.0 - 77.0 %   Lymphocytes Relative 22.9 12.0 - 46.0 %   Monocytes Relative 5.9 3.0 - 12.0 %   Eosinophils Relative 0.2 0.0 - 5.0 %   Basophils Relative 0.6 0.0 - 3.0 %   Neutro Abs 6.3 1.4 - 7.7 K/uL   Lymphs Abs 2.0 0.7 - 4.0 K/uL   Monocytes Absolute 0.5 0.1 - 1.0 K/uL   Eosinophils Absolute 0.0 0.0 - 0.7 K/uL   Basophils Absolute 0.0 0.0 - 0.1 K/uL  Lipid panel  Result Value Ref Range   Cholesterol 228 (H) 0 - 200 mg/dL   Triglycerides 73.0 0.0 - 149.0 mg/dL   HDL 61.30 >39.00 mg/dL   VLDL 14.6 0.0 - 40.0 mg/dL   LDL Cholesterol 152 (H) 0 - 99 mg/dL   Total CHOL/HDL Ratio 4    NonHDL 166.49   VITAMIN D 25 Hydroxy (Vit-D Deficiency, Fractures)  Result Value Ref Range   VITD 37.72 30.00 - 100.00 ng/mL    Assessment and Plan:   Maxx was seen today for insect bite.  Diagnoses and all orders for this visit:  Rash Comments: No red flags. Partially treated, so difficult to identify. Treatment as below. Patient does not tolerate oral medications, so will hold.  Orders: -     triamcinolone ointment (KENALOG) 0.5 %; Apply 1 application topically 2 (two) times daily. -     mupirocin ointment (BACTROBAN) 2 %; Apply to the bite twice a day.    . Reviewed expectations re: course of current medical issues. . Discussed self-management of symptoms. . Outlined signs and symptoms indicating need for more acute  intervention. . Patient verbalized understanding and all questions were answered. Marland Kitchen Health Maintenance issues including appropriate healthy diet, exercise, and smoking avoidance were discussed with patient. . See orders for this visit as documented in the electronic medical record. . Patient received an After Visit Summary.  Briscoe Deutscher, DO [place],  [place] 06/17/2018

## 2018-06-19 ENCOUNTER — Ambulatory Visit (INDEPENDENT_AMBULATORY_CARE_PROVIDER_SITE_OTHER)
Admission: RE | Admit: 2018-06-19 | Discharge: 2018-06-19 | Disposition: A | Discharge status: Home / Self Care | Payer: PPO | Source: Ambulatory Visit | Attending: Family Medicine | Admitting: Family Medicine

## 2018-06-19 DIAGNOSIS — N281 Cyst of kidney, acquired: Secondary | ICD-10-CM

## 2018-06-19 DIAGNOSIS — N200 Calculus of kidney: Secondary | ICD-10-CM | POA: Diagnosis not present

## 2018-06-19 MED ORDER — IOPAMIDOL (ISOVUE-300) INJECTION 61%
100.0000 mL | Freq: Once | INTRAVENOUS | Status: AC | PRN
  Administered 2018-06-19: 100 mL via INTRAVENOUS

## 2018-06-22 DIAGNOSIS — M791 Myalgia, unspecified site: Secondary | ICD-10-CM | POA: Diagnosis not present

## 2018-06-22 DIAGNOSIS — M9902 Segmental and somatic dysfunction of thoracic region: Secondary | ICD-10-CM | POA: Diagnosis not present

## 2018-06-22 DIAGNOSIS — M9901 Segmental and somatic dysfunction of cervical region: Secondary | ICD-10-CM | POA: Diagnosis not present

## 2018-06-25 DIAGNOSIS — M9901 Segmental and somatic dysfunction of cervical region: Secondary | ICD-10-CM | POA: Diagnosis not present

## 2018-06-25 DIAGNOSIS — M9902 Segmental and somatic dysfunction of thoracic region: Secondary | ICD-10-CM | POA: Diagnosis not present

## 2018-06-25 DIAGNOSIS — M791 Myalgia, unspecified site: Secondary | ICD-10-CM | POA: Diagnosis not present

## 2018-06-29 ENCOUNTER — Ambulatory Visit (INDEPENDENT_AMBULATORY_CARE_PROVIDER_SITE_OTHER): Payer: PPO | Admitting: Physician Assistant

## 2018-06-29 ENCOUNTER — Ambulatory Visit: Payer: Self-pay

## 2018-06-29 ENCOUNTER — Other Ambulatory Visit: Payer: Self-pay | Admitting: Physician Assistant

## 2018-06-29 ENCOUNTER — Other Ambulatory Visit (INDEPENDENT_AMBULATORY_CARE_PROVIDER_SITE_OTHER): Payer: PPO

## 2018-06-29 ENCOUNTER — Encounter: Payer: Self-pay | Admitting: Physician Assistant

## 2018-06-29 VITALS — BP 138/80 | HR 79 | Temp 98.5°F | Ht 62.0 in | Wt 138.4 lb

## 2018-06-29 DIAGNOSIS — R002 Palpitations: Secondary | ICD-10-CM

## 2018-06-29 DIAGNOSIS — R7989 Other specified abnormal findings of blood chemistry: Secondary | ICD-10-CM

## 2018-06-29 LAB — TSH: TSH: 0.33 u[IU]/mL — ABNORMAL LOW (ref 0.35–4.50)

## 2018-06-29 LAB — T4, FREE: Free T4: 0.8 ng/dL (ref 0.60–1.60)

## 2018-06-29 NOTE — Patient Instructions (Signed)
It was great to see you! Enjoy your trip to [place]!   Palpitations A palpitation is the feeling that your heartbeat is irregular or is faster than normal. It may feel like your heart is fluttering or skipping a beat. Palpitations are usually not a serious problem. They may be caused by many things, including smoking, caffeine, alcohol, stress, and certain medicines. Although most causes of palpitations are not serious, palpitations can be a sign of a serious medical problem. In some cases, you may need further medical evaluation. Follow these instructions at home: Pay attention to any changes in your symptoms. Take these actions to help with your condition:  Avoid the following: ? Caffeinated coffee, tea, soft drinks, diet pills, and energy drinks. ? Chocolate. ? Alcohol.  Do not use any tobacco products, such as cigarettes, chewing tobacco, and e-cigarettes. If you need help quitting, ask your health care provider.  Try to reduce your stress and anxiety. Things that can help you relax include: ? Yoga. ? Meditation. ? Physical activity, such as swimming, jogging, or walking. ? Biofeedback. This is a method that helps you learn to use your mind to control things in your body, such as your heartbeats.  Get plenty of rest and sleep.  Take over-the-counter and prescription medicines only as told by your health care provider.  Keep all follow-up visits as told by your health care provider. This is important.  Contact a health care provider if:  You continue to have a fast or irregular heartbeat after 24 hours.  Your palpitations occur more often. Get help right away if:  You have chest pain or shortness of breath.  You have a severe headache.  You feel dizzy or you faint. This information is not intended to replace advice given to you by your health care provider. Make sure you discuss any questions you have with your health care provider. Document Released: 12/09/2000 Document  Revised: 05/16/2016 Document Reviewed: 08/27/2015 Elsevier Interactive Patient Education  [employer].

## 2018-06-29 NOTE — Telephone Encounter (Signed)
Having palpitations on and off times 1 week. Only has them at rest. When she is doing yoga or other exercise, does not have them. Notices them at rest and at night. When she has them she gets and "moves around and they go away."  Heart rate this morning is 80. No palpitations at present. Denies chest pain or shortness of breath or dizziness. Instructed if any of these occur before appointment, to go to ED. Verbalizes understanding.  Wants to know if she is ok to fly next week. Reason for Disposition . [1] Palpitations AND [2] no improvement after using CARE ADVICE  Answer Assessment - Initial Assessment Questions 1. DESCRIPTION: "Please describe your heart rate or heart beat that you are having" (e.g., fast/slow, regular/irregular, skipped or extra beats, "palpitations")     No palpitations now 2. ONSET: "When did it start?" (Minutes, hours or days)      Has had this on and off x 1 week 3. DURATION: "How long does it last" (e.g., seconds, minutes, hours)     Will last a few minutes. Walks around and they go away 4. PATTERN "Does it come and go, or has it been constant since it started?"  "Does it get worse with exertion?"   "Are you feeling it now?"     Comes and goes. "They are positional- laying down or sitting." 5. TAP: "Using your hand, can you tap out what you are feeling on a chair or table in front of you, so that I can hear?" (Note: not all patients can do this)       Pulse 80 6. HEART RATE: "Can you tell me your heart rate?" "How many beats in 15 seconds?"  (Note: not all patients can do this)       80 7. RECURRENT SYMPTOM: "Have you ever had this before?" If so, ask: "When was the last time?" and "What happened that time?"      Yes 8. CAUSE: "What do you think is causing the palpitations?"     Unsure 9. CARDIAC HISTORY: "Do you have any history of heart disease?" (e.g., heart attack, angina, bypass surgery, angioplasty, arrhythmia)      Heart murmur 10. OTHER SYMPTOMS: "Do you have  any other symptoms?" (e.g., dizziness, chest pain, sweating, difficulty breathing)       No 11. PREGNANCY: "Is there any chance you are pregnant?" "When was your last menstrual period?"       No  Protocols used: HEART RATE AND HEARTBEAT QUESTIONS-A-AH

## 2018-06-29 NOTE — Progress Notes (Signed)
Natalie Erickson is a 67 y.o. female here for a recurrent problem.  I acted as a Education administrator for [employer], PA-C Anselmo Pickler, LPN  History of Present Illness:   Chief Complaint  Patient presents with  . Palpitations    Palpitations   This is a recurrent problem. Episode onset: Started 3weeks ago  The problem occurs intermittently. The problem has been waxing and waning. On average, each episode lasts 5 minutes. Exacerbated by: Postionally. Pertinent negatives include no chest pain, coughing, dizziness, near-syncope, shortness of breath or syncope. Associated symptoms comments: Pain in ribs. She has tried breathing exercises and deep relaxation for the symptoms. The treatment provided moderate relief. Her past medical history is significant for anxiety.   She takes Magnesium on a regular basis. Had a full panel of labs completed on 06/04/18.    Past Medical History:  Diagnosis Date  . Anxiety 04/06/2017  . Cystitis   . Depression   . Dry eyes 04/06/2017  . Elevated BP without diagnosis of hypertension 04/06/2017  . Fibromyalgia   . Hiatal hernia   . History of colon polyps   . IBS (irritable bowel syndrome)   . Mixed hyperlipidemia 04/06/2017  . TMJ (dislocation of temporomandibular joint)   . Vasomotor rhinitis      Social History   Socioeconomic History  . Marital status: Married    Spouse name: Not on file  . Number of children: 2  . Years of education: Therapist, sports  . Highest education level: Not on file  Occupational History  . Occupation: Retired  Scientific laboratory technician  . Financial resource strain: Not on file  . Food insecurity:    Worry: Not on file    Inability: Not on file  . Transportation needs:    Medical: Not on file    Non-medical: Not on file  Tobacco Use  . Smoking status: Never Smoker  . Smokeless tobacco: Never Used  Substance and Sexual Activity  . Alcohol use: No  . Drug use: No  . Sexual activity: Yes  Lifestyle  . Physical activity:    Days per week: Not on  file    Minutes per session: Not on file  . Stress: Not on file  Relationships  . Social connections:    Talks on phone: Not on file    Gets together: Not on file    Attends religious service: Not on file    Active member of club or organization: Not on file    Attends meetings of clubs or organizations: Not on file    Relationship status: Not on file  . Intimate partner violence:    Fear of current or ex partner: Not on file    Emotionally abused: Not on file    Physically abused: Not on file    Forced sexual activity: Not on file  Other Topics Concern  . Not on file  Social History Narrative   Lives at home w/ her husband and family   Right-handed   Caffeine: none since March 2018    Past Surgical History:  Procedure Laterality Date  . APPENDECTOMY  1959  . LAPAROSCOPY      Family History  Problem Relation Age of Onset  . Breast cancer Sister   . Diabetes Paternal Grandmother   . Hypertension Mother   . Lung cancer Father   . Colon cancer Neg Hx   . Stomach cancer Neg Hx     Allergies  Allergen Reactions  . Statins Tinitus  Achy joints, muscle aches  . Banana Other (See Comments)    migraine  . Chocolate Other (See Comments)    migraine  . Codeine Nausea And Vomiting  . Cranberry Other (See Comments)    Cystitis  . Latex Rash  . Monosodium Glutamate Other (See Comments)    Migraine  . Penicillins Rash    Has patient had a PCN reaction causing immediate rash, facial/tongue/throat swelling, SOB or lightheadedness with hypotension: Yes Has patient had a PCN reaction causing severe rash involving mucus membranes or skin necrosis: No Has patient had a PCN reaction that required hospitalization No Has patient had a PCN reaction occurring within the last 10 years: No If all of the above answers are "NO", then may proceed with Cephalosporin use.   Marland Kitchen Antihistamines, Loratadine-Type     Throat swelling  . Montelukast Other (See Comments)    Throat swelling   . Prednisone Swelling    Throat swelling (no difficulty breathing)  . Sulfites Other (See Comments)    Nausea and headaches  . Zofran [Ondansetron Hcl] Other (See Comments)    Muscle cramps, leg tingling  . Azithromycin Other (See Comments) and Diarrhea    Current Medications:   Current Outpatient Medications:  .  acetaminophen (TYLENOL) 500 MG tablet, Take 1,000 mg by mouth every 6 (six) hours as needed for headache (pain)., Disp: , Rfl:  .  AMBULATORY NON FORMULARY MEDICATION, Magnesium oil Apply to legs as needed, Disp: , Rfl:  .  AMBULATORY NON FORMULARY MEDICATION, Medication Name: triphala 500 mg a day, Disp: , Rfl:  .  AMBULATORY NON FORMULARY MEDICATION, Medication Name: probiotic ultimate flora, Disp: , Rfl:  .  Calcium & Magnesium Carbonates (MYLANTA PO), Take 20 mLs by mouth as needed., Disp: , Rfl:  .  Cholecalciferol (VITAMIN D) 2000 units tablet, Take 2,000 Units by mouth daily. , Disp: , Rfl:  .  MAGNESIUM GLUCONATE PO, Take by mouth., Disp: , Rfl:  .  NON FORMULARY, , Disp: , Rfl:  .  Polyethyl Glycol-Propyl Glycol (SYSTANE ULTRA OP), Apply to eye 4 (four) times daily., Disp: , Rfl:  .  Simethicone (GAS-X PO), Take by mouth as needed., Disp: , Rfl:    Review of Systems:   Review of Systems  Respiratory: Negative for cough and shortness of breath.   Cardiovascular: Positive for palpitations. Negative for chest pain, syncope and near-syncope.  Neurological: Negative for dizziness.    Vitals:   Vitals:   06/29/18 1015  BP: 138/80  Pulse: 79  Temp: 98.5 F (36.9 C)  TempSrc: Oral  SpO2: 98%  Weight: 138 lb 6.1 oz (62.8 kg)  Height: 5\' 2"  (1.575 m)     Body mass index is 25.31 kg/m.  Physical Exam:   Physical Exam  Constitutional: She appears well-developed. She is cooperative.  Non-toxic appearance. She does not have a sickly appearance. She does not appear ill. No distress.  Cardiovascular: Normal rate, regular rhythm, S1 normal, S2 normal, normal  heart sounds and normal pulses.  No LE edema  Pulmonary/Chest: Effort normal and breath sounds normal.  Neurological: She is alert. GCS eye subscore is 4. GCS verbal subscore is 5. GCS motor subscore is 6.  Skin: Skin is warm, dry and intact.  Psychiatric: She has a normal mood and affect. Her speech is normal and behavior is normal.  Nursing note and vitals reviewed.  EKG tracing is personally reviewed.  EKG notes NSR.  No acute changes.   Assessment and Plan:  Kellyanne was seen today for palpitations.  Diagnoses and all orders for this visit:  Palpitations -     EKG 12-Lead -     Ambulatory referral to Cardiology -     TSH   No red flags on exam. EKG reviewed with Dr. Juleen China and patient. EKG tracing is personally reviewed.  EKG notes NSR.  No acute changes. We discussed checking labs but she recently had a full panel completed, however we will check a TSH as this was not done. She would like cardiology evaluation for possible Holter monitor, if appropriate. I have put this order in. Patient is agreeable to plan.   . Reviewed expectations re: course of current medical issues. . Discussed self-management of symptoms. . Outlined signs and symptoms indicating need for more acute intervention. . Patient verbalized understanding and all questions were answered. . See orders for this visit as documented in the electronic medical record. . Patient received an After-Visit Summary.  CMA or LPN served as scribe during this visit. History, Physical, and Plan performed by medical provider. Documentation and orders reviewed and attested to.   Inda Coke, PA-C

## 2018-06-29 NOTE — Telephone Encounter (Signed)
Noted  

## 2018-06-29 NOTE — Telephone Encounter (Signed)
See note

## 2018-07-17 ENCOUNTER — Other Ambulatory Visit (INDEPENDENT_AMBULATORY_CARE_PROVIDER_SITE_OTHER): Payer: PPO

## 2018-07-17 DIAGNOSIS — R899 Unspecified abnormal finding in specimens from other organs, systems and tissues: Secondary | ICD-10-CM

## 2018-07-17 LAB — COMPREHENSIVE METABOLIC PANEL
ALT: 28 U/L (ref 0–35)
AST: 16 U/L (ref 0–37)
Albumin: 4.3 g/dL (ref 3.5–5.2)
Alkaline Phosphatase: 139 U/L — ABNORMAL HIGH (ref 39–117)
BUN: 24 mg/dL — ABNORMAL HIGH (ref 6–23)
CO2: 32 mEq/L (ref 19–32)
Calcium: 9.7 mg/dL (ref 8.4–10.5)
Chloride: 103 mEq/L (ref 96–112)
Creatinine, Ser: 0.75 mg/dL (ref 0.40–1.20)
GFR: 82.01 mL/min (ref >60.00)
Glucose, Bld: 125 mg/dL — ABNORMAL HIGH (ref 70–99)
Potassium: 4.4 mEq/L (ref 3.5–5.1)
Sodium: 142 mEq/L (ref 135–145)
Total Bilirubin: 0.5 mg/dL (ref 0.2–1.2)
Total Protein: 7.3 g/dL (ref 6.0–8.3)

## 2018-07-23 ENCOUNTER — Other Ambulatory Visit (INDEPENDENT_AMBULATORY_CARE_PROVIDER_SITE_OTHER): Payer: PPO

## 2018-07-23 ENCOUNTER — Other Ambulatory Visit: Payer: Self-pay | Admitting: Surgical

## 2018-07-23 DIAGNOSIS — R748 Abnormal levels of other serum enzymes: Secondary | ICD-10-CM | POA: Diagnosis not present

## 2018-07-23 LAB — COMPREHENSIVE METABOLIC PANEL
ALT: 14 U/L (ref 0–35)
AST: 15 U/L (ref 0–37)
Albumin: 4.3 g/dL (ref 3.5–5.2)
Alkaline Phosphatase: 122 U/L — ABNORMAL HIGH (ref 39–117)
BUN: 17 mg/dL (ref 6–23)
CO2: 31 mEq/L (ref 19–32)
Calcium: 10.2 mg/dL (ref 8.4–10.5)
Chloride: 103 mEq/L (ref 96–112)
Creatinine, Ser: 0.76 mg/dL (ref 0.40–1.20)
GFR: 80.76 mL/min (ref >60.00)
Glucose, Bld: 121 mg/dL — ABNORMAL HIGH (ref 70–99)
Potassium: 4.1 mEq/L (ref 3.5–5.1)
Sodium: 141 mEq/L (ref 135–145)
Total Bilirubin: 0.5 mg/dL (ref 0.2–1.2)
Total Protein: 7.6 g/dL (ref 6.0–8.3)

## 2018-07-23 LAB — GAMMA GT: GGT: 38 U/L (ref 7–51)

## 2018-07-25 DIAGNOSIS — M9902 Segmental and somatic dysfunction of thoracic region: Secondary | ICD-10-CM | POA: Diagnosis not present

## 2018-07-25 DIAGNOSIS — M791 Myalgia, unspecified site: Secondary | ICD-10-CM | POA: Diagnosis not present

## 2018-07-25 DIAGNOSIS — M9901 Segmental and somatic dysfunction of cervical region: Secondary | ICD-10-CM | POA: Diagnosis not present

## 2018-08-02 DIAGNOSIS — M9902 Segmental and somatic dysfunction of thoracic region: Secondary | ICD-10-CM | POA: Diagnosis not present

## 2018-08-02 DIAGNOSIS — M9901 Segmental and somatic dysfunction of cervical region: Secondary | ICD-10-CM | POA: Diagnosis not present

## 2018-08-02 DIAGNOSIS — M791 Myalgia, unspecified site: Secondary | ICD-10-CM | POA: Diagnosis not present

## 2018-08-08 DIAGNOSIS — M9901 Segmental and somatic dysfunction of cervical region: Secondary | ICD-10-CM | POA: Diagnosis not present

## 2018-08-08 DIAGNOSIS — M791 Myalgia, unspecified site: Secondary | ICD-10-CM | POA: Diagnosis not present

## 2018-08-08 DIAGNOSIS — M9902 Segmental and somatic dysfunction of thoracic region: Secondary | ICD-10-CM | POA: Diagnosis not present

## 2018-08-20 DIAGNOSIS — M9902 Segmental and somatic dysfunction of thoracic region: Secondary | ICD-10-CM | POA: Diagnosis not present

## 2018-08-20 DIAGNOSIS — M9901 Segmental and somatic dysfunction of cervical region: Secondary | ICD-10-CM | POA: Diagnosis not present

## 2018-08-20 DIAGNOSIS — M791 Myalgia, unspecified site: Secondary | ICD-10-CM | POA: Diagnosis not present

## 2018-08-22 ENCOUNTER — Ambulatory Visit: Payer: PPO | Admitting: Physician Assistant

## 2018-08-22 ENCOUNTER — Encounter: Payer: Self-pay | Admitting: Physician Assistant

## 2018-08-22 VITALS — BP 128/88 | HR 75 | Ht 62.0 in | Wt 143.5 lb

## 2018-08-22 DIAGNOSIS — E785 Hyperlipidemia, unspecified: Secondary | ICD-10-CM | POA: Diagnosis not present

## 2018-08-22 DIAGNOSIS — R002 Palpitations: Secondary | ICD-10-CM

## 2018-08-22 DIAGNOSIS — R011 Cardiac murmur, unspecified: Secondary | ICD-10-CM | POA: Diagnosis not present

## 2018-08-22 NOTE — Patient Instructions (Addendum)
Medication Instructions:  Your physician recommends that you continue on your current medications as directed. Please refer to the Current Medication list given to you today.   Labwork: NONE ORDERED TODAY  Testing/Procedures: 1. Your physician has requested that you have an echocardiogram. Echocardiography is a painless test that uses sound waves to create images of your heart. It provides your doctor with information about the size and shape of your heart and how well your heart's chambers and valves are working. This procedure takes approximately one hour. There are no restrictions for this procedure.  2. Your physician has recommended that you wear a 48 HOUR holter monitor. Holter monitors are medical devices that record the heart's electrical activity. Doctors most often use these monitors to diagnose arrhythmias. Arrhythmias are problems with the speed or rhythm of the heartbeat. The monitor is a small, portable device. You can wear one while you do your normal daily activities. This is usually used to diagnose what is causing palpitations/syncope (passing out).    Follow-Up: AS NEEDED PENDING TEST RESULTS    Any Other Special Instructions Will Be Listed Below (If Applicable).     If you need a refill on your cardiac medications before your next appointment, please call your pharmacy.
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
## 2018-08-24 ENCOUNTER — Other Ambulatory Visit (HOSPITAL_COMMUNITY): Payer: PPO

## 2018-08-28 DIAGNOSIS — M47812 Spondylosis without myelopathy or radiculopathy, cervical region: Secondary | ICD-10-CM | POA: Diagnosis not present

## 2018-08-28 DIAGNOSIS — M791 Myalgia, unspecified site: Secondary | ICD-10-CM | POA: Diagnosis not present

## 2018-08-28 DIAGNOSIS — M9902 Segmental and somatic dysfunction of thoracic region: Secondary | ICD-10-CM | POA: Diagnosis not present

## 2018-08-28 DIAGNOSIS — M9901 Segmental and somatic dysfunction of cervical region: Secondary | ICD-10-CM | POA: Diagnosis not present

## 2018-08-30 DIAGNOSIS — M9901 Segmental and somatic dysfunction of cervical region: Secondary | ICD-10-CM | POA: Diagnosis not present

## 2018-08-30 DIAGNOSIS — M9902 Segmental and somatic dysfunction of thoracic region: Secondary | ICD-10-CM | POA: Diagnosis not present

## 2018-08-30 DIAGNOSIS — M791 Myalgia, unspecified site: Secondary | ICD-10-CM | POA: Diagnosis not present

## 2018-08-31 ENCOUNTER — Ambulatory Visit (INDEPENDENT_AMBULATORY_CARE_PROVIDER_SITE_OTHER): Payer: PPO

## 2018-08-31 ENCOUNTER — Other Ambulatory Visit: Payer: Self-pay | Admitting: Physician Assistant

## 2018-08-31 ENCOUNTER — Other Ambulatory Visit: Payer: Self-pay

## 2018-08-31 ENCOUNTER — Ambulatory Visit (HOSPITAL_COMMUNITY): Payer: PPO | Attending: Cardiovascular Disease

## 2018-08-31 DIAGNOSIS — R002 Palpitations: Secondary | ICD-10-CM

## 2018-08-31 DIAGNOSIS — I509 Heart failure, unspecified: Secondary | ICD-10-CM | POA: Insufficient documentation

## 2018-08-31 DIAGNOSIS — I081 Rheumatic disorders of both mitral and tricuspid valves: Secondary | ICD-10-CM | POA: Insufficient documentation

## 2018-08-31 DIAGNOSIS — R011 Cardiac murmur, unspecified: Secondary | ICD-10-CM

## 2018-09-02 ENCOUNTER — Encounter: Payer: Self-pay | Admitting: Physician Assistant

## 2018-09-03 ENCOUNTER — Telehealth: Payer: Self-pay | Admitting: *Deleted

## 2018-09-03 NOTE — Telephone Encounter (Signed)
Follow Up: ° ° ° °Returning your call from today,concerning her results. °

## 2018-09-03 NOTE — Telephone Encounter (Signed)
Pt has been notified of Echo results by phone with verbal understanding. Pt states she just turner her heart monitor in today and wanted to know when we would have results back. I explained that the monitor has to first to be uploaded, then reviewed by MD then we will call her with the results. Explained this can take a week or so. Pt thanked me for the call.

## 2018-09-03 NOTE — Telephone Encounter (Signed)
Left message to go over echo results. 

## 2018-09-03 NOTE — Telephone Encounter (Signed)
-----   Message from Liliane Shi, [place] sent at 09/02/2018 10:20 PM EDT ----- This study demonstrates:  Normal heart function (ejection fraction) and mildly impaired relaxation (diastolic dysfunction).  There are no significant valvular abnormalities.   Medication changes / Follow up studies / Other recommendations:    - Continue current medications and follow up as planned.  Please send results to the PCP:  Briscoe Deutscher, DO  Richardson Dopp, PA-C 09/02/2018 10:18 PM

## 2018-09-04 DIAGNOSIS — M9902 Segmental and somatic dysfunction of thoracic region: Secondary | ICD-10-CM | POA: Diagnosis not present

## 2018-09-04 DIAGNOSIS — M9901 Segmental and somatic dysfunction of cervical region: Secondary | ICD-10-CM | POA: Diagnosis not present

## 2018-09-04 DIAGNOSIS — M791 Myalgia, unspecified site: Secondary | ICD-10-CM | POA: Diagnosis not present

## 2018-09-05 ENCOUNTER — Ambulatory Visit (INDEPENDENT_AMBULATORY_CARE_PROVIDER_SITE_OTHER): Payer: PPO | Admitting: Family Medicine

## 2018-09-05 ENCOUNTER — Encounter: Payer: Self-pay | Admitting: Family Medicine

## 2018-09-05 VITALS — BP 132/76 | HR 98 | Temp 98.1°F | Ht 62.0 in | Wt 141.0 lb

## 2018-09-05 DIAGNOSIS — M542 Cervicalgia: Secondary | ICD-10-CM

## 2018-09-05 NOTE — Progress Notes (Signed)
Natalie Erickson is a 68 y.o. female here for an acute visit.  History of Present Illness:   Natalie Erickson CMA acting as scribe for Dr. Juleen China.  HPI: Patient comes in today for neck pain. Patient lifted something above her head sometime in July 2019. Since then she has been having neck pain. Patient has been seeing a [facility] [place] since then. Patient had an xray on 08/28/18 and the chiropractor does not think that it is healing like it should. They are recommending patient having physical therapy.   PMHx, SurgHx, SocialHx, Medications, and Allergies were reviewed in the Visit Navigator and updated as appropriate.  Review of Systems  Constitutional: Negative for chills and fever.  HENT: Positive for sore throat. Negative for congestion and ear pain.   Eyes: Negative for blurred vision and double vision.  Respiratory: Negative for cough and shortness of breath.   Cardiovascular: Negative for chest pain and palpitations.  Gastrointestinal: Negative for abdominal pain and nausea.  Genitourinary: Negative for dysuria and hematuria.  Musculoskeletal: Positive for neck pain.  Neurological: Negative for dizziness and headaches.  Psychiatric/Behavioral: Negative for depression. The patient does not have insomnia.    Current Medications:   .  acetaminophen (TYLENOL) 500 MG tablet, Take 1,000 mg by mouth every 6 (six) hours as needed for headache (pain)., Disp: , Rfl:  .  AMBULATORY NON FORMULARY MEDICATION, Magnesium oil Apply to legs as needed, Disp: , Rfl:  .  AMBULATORY NON FORMULARY MEDICATION, Medication Name: triphala 500 mg a day, Disp: , Rfl:  .  AMBULATORY NON FORMULARY MEDICATION, Medication Name: probiotic ultimate flora, Disp: , Rfl:  .  Calcium & Magnesium Carbonates (MYLANTA PO), Take 20 mLs by mouth as directed. , Disp: , Rfl:  .  Cholecalciferol (VITAMIN D) 2000 units tablet, Take 2,000 Units by mouth daily. , Disp: , Rfl:  .  MAGNESIUM GLUCONATE PO, Take by mouth as  directed. , Disp: , Rfl:  .  NON FORMULARY, , Disp: , Rfl:  .  Polyethyl Glycol-Propyl Glycol (SYSTANE ULTRA OP), Apply to eye 4 (four) times daily., Disp: , Rfl:  .  Simethicone (GAS-X PO), Take by mouth as directed. , Disp: , Rfl:    Allergies  Allergen Reactions  . Statins Tinitus    Achy joints, muscle aches  . Banana Other (See Comments)    migraine  . Chocolate Other (See Comments)    migraine  . Codeine Nausea And Vomiting  . Cranberry Other (See Comments)    Cystitis  . Latex Rash  . Monosodium Glutamate Other (See Comments)    Migraine  . Penicillins Rash    Has patient had a PCN reaction causing immediate rash, facial/tongue/throat swelling, SOB or lightheadedness with hypotension: Yes Has patient had a PCN reaction causing severe rash involving mucus membranes or skin necrosis: No Has patient had a PCN reaction that required hospitalization No Has patient had a PCN reaction occurring within the last 10 years: No If all of the above answers are "NO", then may proceed with Cephalosporin use.   Marland Kitchen Antihistamines, Loratadine-Type     Throat swelling  . Montelukast Other (See Comments)    Throat swelling  . Prednisone Swelling    Throat swelling (no difficulty breathing)  . Sulfites Other (See Comments)    Nausea and headaches  . Zofran [Ondansetron Hcl] Other (See Comments)    Muscle cramps, leg tingling  . Azithromycin Other (See Comments) and Diarrhea   Review of Systems:  Pertinent items are noted in the HPI. Otherwise, ROS is negative.  Vitals:   Vitals:   09/05/18 1542  BP: 132/76  Pulse: 98  Temp: 98.1 F (36.7 C)  TempSrc: Oral  SpO2: 98%  Weight: 141 lb (64 kg)  Height: 5\' 2"  (1.575 m)     Body mass index is 25.79 kg/m.  Physical Exam:   Physical Exam  Constitutional: She appears well-nourished.  HENT:  Head: Normocephalic and atraumatic.  Eyes: Pupils are equal, round, and reactive to light. EOM are normal.  Neck: Normal range of  motion. Neck supple.  Cardiovascular: Normal rate, regular rhythm, normal heart sounds and intact distal pulses.  Pulmonary/Chest: Effort normal.  Abdominal: Soft.  Musculoskeletal:       Cervical back: She exhibits decreased range of motion and spasm.  Skin: Skin is warm.  Psychiatric: She has a normal mood and affect. Her behavior is normal.  Nursing note and vitals reviewed.  Assessment and Plan:   [name] was seen today for back pain.  Diagnoses and all orders for this visit:  Neck pain Comments: Agree with PT. Discussed other modalities of pain control.  Orders: -     Ambulatory referral to Physical Therapy   . Reviewed expectations re: course of current medical issues. . Discussed self-management of symptoms. . Outlined signs and symptoms indicating need for more acute intervention. . Patient verbalized understanding and all questions were answered. Marland Kitchen Health Maintenance issues including appropriate healthy diet, exercise, and smoking avoidance were discussed with patient. . See orders for this visit as documented in the electronic medical record. . Patient received an After Visit Summary.  CMA served as Education administrator during this visit. History, Physical, and Plan performed by medical provider. The above documentation has been reviewed and is accurate and complete. Briscoe Deutscher, D.O.  Briscoe Deutscher, DO [place] 09/10/2018

## 2018-09-06 ENCOUNTER — Ambulatory Visit (INDEPENDENT_AMBULATORY_CARE_PROVIDER_SITE_OTHER): Payer: PPO | Admitting: Physical Therapy

## 2018-09-06 DIAGNOSIS — M3501 Sicca syndrome with keratoconjunctivitis: Secondary | ICD-10-CM | POA: Diagnosis not present

## 2018-09-06 DIAGNOSIS — H04123 Dry eye syndrome of bilateral lacrimal glands: Secondary | ICD-10-CM | POA: Diagnosis not present

## 2018-09-06 DIAGNOSIS — M542 Cervicalgia: Secondary | ICD-10-CM

## 2018-09-06 DIAGNOSIS — H2513 Age-related nuclear cataract, bilateral: Secondary | ICD-10-CM | POA: Diagnosis not present

## 2018-09-06 DIAGNOSIS — H1013 Acute atopic conjunctivitis, bilateral: Secondary | ICD-10-CM | POA: Diagnosis not present

## 2018-09-06 NOTE — Patient Instructions (Signed)
Access Code: [email or web address]  URL: [email or web address]  Date: 09/06/2018  Prepared by: Lyndee Hensen   Exercises  Seated Cervical Retraction - 10 reps - 2 sets - 2x daily  Seated Cervical Rotation AROM - 10 reps - 2 sets - 2x daily  Seated Shoulder Rolls - 10 reps - 2 sets - 2x daily  Standing Scapular Retraction - 10 reps - 2 sets - 2x daily  Seated Cervical Sidebending Stretch - 3 sets - 30 hold - 2x daily

## 2018-09-10 ENCOUNTER — Encounter: Payer: Self-pay | Admitting: Family Medicine

## 2018-09-10 ENCOUNTER — Ambulatory Visit (INDEPENDENT_AMBULATORY_CARE_PROVIDER_SITE_OTHER): Payer: PPO | Admitting: Physical Therapy

## 2018-09-10 DIAGNOSIS — M542 Cervicalgia: Secondary | ICD-10-CM | POA: Diagnosis not present

## 2018-09-10 DIAGNOSIS — H43811 Vitreous degeneration, right eye: Secondary | ICD-10-CM | POA: Insufficient documentation

## 2018-09-11 ENCOUNTER — Encounter: Payer: Self-pay | Admitting: Physical Therapy

## 2018-09-11 NOTE — Addendum Note (Signed)
Addended by: Lyndee Hensen on: 09/11/2018 01:20 PM   Modules accepted: Orders

## 2018-09-11 NOTE — Therapy (Signed)
[facility] [address] [place], [place], [address] Phone: [phone]   Fax:  [phone]  Physical Therapy Treatment  Patient Details  Name: Natalie Erickson MRN: [id number] Date of Birth: 01/23/1951 Referring Provider: Briscoe Deutscher   Encounter Date: 09/10/2018  PT End of Session - 09/11/18 1618    Visit Number  2    Number of Visits  12    Date for PT Re-Evaluation  10/18/18    Authorization Type  HTA    PT Start Time  0800    PT Stop Time  0844    PT Time Calculation (min)  44 min    Activity Tolerance  Patient tolerated treatment well    Behavior During Therapy  [facility] for tasks assessed/performed       Past Medical History:  Diagnosis Date  . Anxiety 04/06/2017  . Cystitis   . Depression   . Dry eyes 04/06/2017  . Elevated BP without diagnosis of hypertension 04/06/2017   lost weight >> BP improved  . Fibromyalgia   . Heart murmur    hx of 2 Echocardiograms in [place] (pt was told they were normal)  . Hiatal hernia   . History of colon polyps   . History of echocardiogram    Echo 9/19: mild LVH, EF 60-65, no RWMA, Gr 1 DD, trivial MR/TR, PASP 16  . History of Lyme disease   . IBS (irritable bowel syndrome)   . Mixed hyperlipidemia 04/06/2017   intol to statins due to myalgias; never tried Zetia  . TMJ (dislocation of temporomandibular joint)   . Vasomotor rhinitis     Past Surgical History:  Procedure Laterality Date  . APPENDECTOMY  1959  . LAPAROSCOPY    . LEFT OOPHORECTOMY      There were no vitals filed for this visit.  Subjective Assessment - 09/11/18 1617    Subjective  Pt states soreness today. She also reports numbness into L side of face at times when sleeping, but this improves with position change.     Currently in Pain?  Yes    Pain Score  5     Pain Location  Neck    Pain Orientation  Right;Left    Pain Descriptors / Indicators  Aching;Tightness    Pain Type  Acute pain    Pain Onset  More  than a month ago    Pain Frequency  Intermittent                       [facility] Adult PT Treatment/Exercise - 09/11/18 1621      Exercises   Exercises  Neck      Neck Exercises: Theraband   Scapula Retraction  20 reps;Red      Neck Exercises: Seated   Neck Retraction  20 reps      Neck Exercises: Sidelying   Other Sidelying Exercise  Shoulder Abduction x10 bil;     Other Sidelying Exercise  Shoulder horizontal abd x10 bil;      Manual Therapy   Manual Therapy  Joint mobilization;Soft tissue mobilization;Manual Traction;Passive ROM    Joint Mobilization  PA mobs lower c-spine, grade 3;  Side glides  c-spine;     Soft tissue mobilization  STM to bil UT, levator, L rhomboid, and cervical paraspinals;     Passive ROM  All cervical motions, Manual UT stretching;     Manual Traction  10 sec x10      Neck Exercises:  Stretches   Upper Trapezius Stretch  2 reps;30 seconds    Levator Stretch  2 reps;30 seconds               PT Short Term Goals - 09/11/18 1232      PT SHORT TERM GOAL #1   Title  Pt to be independent with initial HEP     Time  2    Period  Weeks    Status  New    Target Date  09/20/18      PT SHORT TERM GOAL #2   Title  Pt to report decreased pain to 3/10 in neck/upper back    Time  2    Period  Weeks    Status  New    Target Date  09/20/18        PT Long Term Goals - 09/11/18 1233      PT LONG TERM GOAL #1   Title  Pt to be independent with long term HEP     Time  6    Period  Weeks    Status  New    Target Date  10/18/18      PT LONG TERM GOAL #2   Title  Pt to report decreased pain to 0-2/10 with activity    Time  6    Period  Weeks    Status  New    Target Date  10/18/18      PT LONG TERM GOAL #3   Title  Pt to demo improved cervical ROM , to be WNL and pain free , to improve ability for driving and IADLS.     Time  6    Period  Weeks    Status  New    Target Date  10/18/18      PT LONG TERM GOAL #4   Title  Pt  to demo improved strength of R shoulder and postural muscles, to be at least 4+/5 to improve stability and pain.     Time  6    Period  Weeks    Status  New    Target Date  10/18/18            Plan - 09/11/18 1625    Clinical Impression Statement  Pt states mild soreness into throat and swallowing with cervical retraction and side bending stretches today, will continue to monitor this. Pt wiht tightness in Bil upper traps addressed with STM today. Pt able to progress ther ex for postural strengthening wihtout increased pain. Plan to progress as tolerated.     Rehab Potential  Good    PT Frequency  2x / week    PT Duration  6 weeks    PT Treatment/Interventions  ADLs/Self Care Home Management;Cryotherapy;Electrical Stimulation;Moist Heat;Iontophoresis 4mg /ml Dexamethasone;Therapeutic activities;Functional mobility training;Ultrasound;Traction;Therapeutic exercise;Neuromuscular re-education;Patient/family education;Dry needling;Passive range of motion;Manual techniques;Taping    Consulted and Agree with Plan of Care  Patient       Patient will benefit from skilled therapeutic intervention in order to improve the following deficits and impairments:  Hypomobility, Decreased activity tolerance, Decreased strength, Pain, Increased muscle spasms, Decreased mobility, Decreased range of motion, Improper body mechanics  Visit Diagnosis: Neck pain     Problem List Patient Active Problem List   Diagnosis Date Noted  . Posterior vitreous detachment of right eye 09/10/2018  . History of IBS 01/30/2018  . Urolithiasis 06/23/2017  . Fibromyalgia 06/04/2017  . Chronic fatigue 06/04/2017  . Medication intolerance 06/04/2017  . ANA  positive 05/20/2017  . 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 Serum calcium elevated 05/20/2017  . Interstitial cystitis 05/20/2017  . Dyspepsia 05/20/2017  . Mixed hyperlipidemia 04/06/2017  . Elevated BP without diagnosis of hypertension 04/06/2017  . Anxiety  04/06/2017  . Dry eyes 04/06/2017    Lyndee Hensen, PT, DPT 4:28 PM  09/11/18    [facility] [place], [place], [address] Phone: [phone]   Fax:  [phone]  Name: Natalie Erickson MRN: [id number] Date of Birth: Oct 25, 1951

## 2018-09-11 NOTE — Therapy (Signed)
[facility] [place], [place], [address] Phone: [phone]   Fax:  [phone]  Physical Therapy Evaluation  Patient Details  Name: Natalie Erickson MRN: [id number] Date of Birth: 10/10/1951 Referring Provider: Briscoe Deutscher   Encounter Date: 09/06/2018  PT End of Session - 09/11/18 1230    Visit Number  1    Number of Visits  12    Date for PT Re-Evaluation  10/18/18    Authorization Type  HTA    PT Start Time  0820    PT Stop Time  0855    PT Time Calculation (min)  35 min    Activity Tolerance  Patient tolerated treatment well    Behavior During Therapy  [facility] for tasks assessed/performed       Past Medical History:  Diagnosis Date  . Anxiety 04/06/2017  . Cystitis   . Depression   . Dry eyes 04/06/2017  . Elevated BP without diagnosis of hypertension 04/06/2017   lost weight >> BP improved  . Fibromyalgia   . Heart murmur    hx of 2 Echocardiograms in [place] (pt was told they were normal)  . Hiatal hernia   . History of colon polyps   . History of echocardiogram    Echo 9/19: mild LVH, EF 60-65, no RWMA, Gr 1 DD, trivial MR/TR, PASP 16  . History of Lyme disease   . IBS (irritable bowel syndrome)   . Mixed hyperlipidemia 04/06/2017   intol to statins due to myalgias; never tried Zetia  . TMJ (dislocation of temporomandibular joint)   . Vasomotor rhinitis     Past Surgical History:  Procedure Laterality Date  . APPENDECTOMY  1959  . LAPAROSCOPY    . LEFT OOPHORECTOMY      There were no vitals filed for this visit.       [facility] PT Assessment - 09/11/18 0001      Assessment   Medical Diagnosis  Neck Pain    Referring Provider  Briscoe Deutscher    Hand Dominance  Right    Prior Therapy  No/ Chiropractor      Precautions   Precautions  None      Balance Screen   Has the patient fallen in the past 6 months  No      Prior Function   Level of Independence  Independent      Cognition   Overall Cognitive Status  Within Functional Limits for tasks assessed      Posture/Postural Control   Posture Comments  Forward head, rounded shoulders      ROM / Strength   AROM / PROM / Strength  AROM;Strength      AROM   Overall AROM Comments  Cervical:  Flexion: wfl,   Extension: Mild defiict;  L rotation: mild deficit;  R rotation: Mild/moderate deficit;   R shoulder: 130 deg for Flex and ABd; Pain with movement;       Strength   Overall Strength Comments  Rhomboid: 4-/5;  Shoulder flex: 4-/5; abd: 4-/5;  IR: 4+/5  ER: 4+/5;       Palpation   Palpation comment  Soreness and tightness in Bil UT region; R rhomboid, L levator, L cervical paraspinals;        Special Tests   Other special tests  Mild opening restriction for jaw;                 Objective measurements completed on examination: See above findings.  [facility] Adult PT Treatment/Exercise - 09/11/18 0001      Exercises   Exercises  Neck      Neck Exercises: Seated   Neck Retraction  10 reps    Cervical Rotation  10 reps    Shoulder Rolls  10 reps    Other Seated Exercise  Scap squeeze x15;       Neck Exercises: Stretches   Upper Trapezius Stretch  2 reps;30 seconds    Levator Stretch  2 reps;30 seconds             PT Education - 09/11/18 1230    Education Details  PT POC, HEP    Person(s) Educated  Patient    Methods  Explanation;Verbal cues;Handout    Comprehension  Verbalized understanding;Verbal cues required;Need further instruction       PT Short Term Goals - 09/11/18 1232      PT SHORT TERM GOAL #1   Title  Pt to be independent with initial HEP     Time  2    Period  Weeks    Status  New    Target Date  09/20/18      PT SHORT TERM GOAL #2   Title  Pt to report decreased pain to 3/10 in neck/upper back    Time  2    Period  Weeks    Status  New    Target Date  09/20/18        PT Long Term Goals - 09/11/18 1233      PT LONG TERM GOAL #1   Title  Pt to be  independent with long term HEP     Time  6    Period  Weeks    Status  New    Target Date  10/18/18      PT LONG TERM GOAL #2   Title  Pt to report decreased pain to 0-2/10 with activity    Time  6    Period  Weeks    Status  New    Target Date  10/18/18      PT LONG TERM GOAL #3   Title  Pt to demo improved cervical ROM , to be WNL and pain free , to improve ability for driving and IADLS.     Time  6    Period  Weeks    Status  New    Target Date  10/18/18      PT LONG TERM GOAL #4   Title  Pt to demo improved strength of R shoulder and postural muscles, to be at least 4+/5 to improve stability and pain.     Time  6    Period  Weeks    Status  New    Target Date  10/18/18             Plan - 09/11/18 1237    Clinical Impression Statement  Pt presents with primary complaint of increased pain in neck and upper back region. She has decreased cervical ROM, and increased pain with movement. She has increased tightness and restrictions of upper traps, and surrounding cervical musculature. She has decreased posture, with forward head and rounded shoulders. She has weakness in R shouder and postural muscles bilaterally, and will benefit from strengthening for decreased pain. Pt to benefit from skilled PT to improve deficits, and return to PLOF without pain.     Clinical Presentation  Stable    Clinical Decision Making  Low  Rehab Potential  Good    PT Frequency  2x / week    PT Duration  6 weeks    PT Treatment/Interventions  ADLs/Self Care Home Management;Cryotherapy;Electrical Stimulation;Moist Heat;Iontophoresis 4mg /ml Dexamethasone;Therapeutic activities;Functional mobility training;Ultrasound;Traction;Therapeutic exercise;Neuromuscular re-education;Patient/family education;Dry needling;Passive range of motion;Manual techniques;Taping    Consulted and Agree with Plan of Care  Patient       Patient will benefit from skilled therapeutic intervention in order to improve the  following deficits and impairments:  Hypomobility, Decreased activity tolerance, Decreased strength, Pain, Increased muscle spasms, Decreased mobility, Decreased range of motion, Improper body mechanics  Visit Diagnosis: Neck pain     Problem List Patient Active Problem List   Diagnosis Date Noted  . Posterior vitreous detachment of right eye 09/10/2018  . History of IBS 01/30/2018  . Urolithiasis 06/23/2017  . Fibromyalgia 06/04/2017  . Chronic fatigue 06/04/2017  . Medication intolerance 06/04/2017  . ANA positive 05/20/2017  . 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 Serum calcium elevated 05/20/2017  . Interstitial cystitis 05/20/2017  . Dyspepsia 05/20/2017  . Mixed hyperlipidemia 04/06/2017  . Elevated BP without diagnosis of hypertension 04/06/2017  . Anxiety 04/06/2017  . Dry eyes 04/06/2017    Lyndee Hensen, PT, DPT 1:09 PM  09/11/18    [facility] [place], [place], [address] Phone: [phone]   Fax:  [phone]  Name: Evolette Pendell MRN: [id number] Date of Birth: May 26, 1951

## 2018-09-12 ENCOUNTER — Telehealth: Payer: Self-pay | Admitting: Physician Assistant

## 2018-09-12 NOTE — Telephone Encounter (Signed)
Follow Up:    Pt wants to know if her monitor results are ready please. She said to please call after lunch if possible please.

## 2018-09-12 NOTE — Telephone Encounter (Signed)
Tried to reach pt to go over monitor results, though no answer. I will try again tomorrow to reach pt.

## 2018-09-12 NOTE — Telephone Encounter (Signed)
Monitor still not read by physician. I have reviewed the study. This demonstrates normal sinus rhythm.  There were occasional PVCs and PACs and some pairs/triplets.  Symptoms corresponded with premature beats.  There were no significant arrhythmias.   PLAN:  1. Reduce any stimulants (caffeine, chocolate, over the counter decongestants). 2. Rx for propranolol 10 mg Once daily as needed for palpitations, #30, 3 refills. 3. Arrange follow up with me in 6 months.  Richardson Dopp, PA-C    09/12/2018 5:17 PM

## 2018-09-12 NOTE — Telephone Encounter (Signed)
I will send a message to Richardson Dopp, PA to see if he will read pt's monitor results.

## 2018-09-13 ENCOUNTER — Encounter: Payer: Self-pay | Admitting: Physical Therapy

## 2018-09-13 ENCOUNTER — Ambulatory Visit (INDEPENDENT_AMBULATORY_CARE_PROVIDER_SITE_OTHER): Payer: PPO | Admitting: Physical Therapy

## 2018-09-13 DIAGNOSIS — M542 Cervicalgia: Secondary | ICD-10-CM

## 2018-09-13 NOTE — Therapy (Signed)
[facility] [address] [place], [place], [address] Phone: [phone]   Fax:  [phone]  Physical Therapy Treatment  Patient Details  Name: Natalie Erickson MRN: [id number] Date of Birth: [birth year] Referring Provider: Briscoe Deutscher   Encounter Date: 09/13/2018  PT End of Session - 09/13/18 0944    Visit Number  3    Number of Visits  12    Date for PT Re-Evaluation  10/18/18    Authorization Type  HTA    PT Start Time  0930    PT Stop Time  0867    PT Time Calculation (min)  44 min    Activity Tolerance  Patient tolerated treatment well    Behavior During Therapy  [facility] for tasks assessed/performed       Past Medical History:  Diagnosis Date  . Anxiety 04/06/2017  . Cystitis   . Depression   . Dry eyes 04/06/2017  . Elevated BP without diagnosis of hypertension 04/06/2017   lost weight >> BP improved  . Fibromyalgia   . Heart murmur    hx of 2 Echocardiograms in [place] (pt was told they were normal)  . Hiatal hernia   . History of colon polyps   . History of echocardiogram    Echo 9/19: mild LVH, EF 60-65, no RWMA, Gr 1 DD, trivial MR/TR, PASP 16  . History of Lyme disease   . IBS (irritable bowel syndrome)   . Mixed hyperlipidemia 04/06/2017   intol to statins due to myalgias; never tried Zetia  . TMJ (dislocation of temporomandibular joint)   . Vasomotor rhinitis     Past Surgical History:  Procedure Laterality Date  . APPENDECTOMY  1959  . LAPAROSCOPY    . LEFT OOPHORECTOMY      There were no vitals filed for this visit.  Subjective Assessment - 09/13/18 0933    Subjective  Pt states she was a little "woozy" yesterday after doing exercises. She wa able to go to yoga latera in the day.     Patient Stated Goals  Decreased pain    Currently in Pain?  Yes    Pain Score  2     Pain Location  Neck    Pain Orientation  Right;Left    Pain Descriptors / Indicators  Aching;Tightness    Pain Type  Acute pain    Pain Onset  More than a month ago    Pain Frequency  Intermittent                       [facility] Adult PT Treatment/Exercise - 09/13/18 0934      Exercises   Exercises  Neck      Neck Exercises: Theraband   Scapula Retraction  20 reps;Red      Neck Exercises: Standing   Wall Wash  x10 bil; at door      Neck Exercises: Seated   Neck Retraction  20 reps      Neck Exercises: Sidelying   Other Sidelying Exercise  Shoulder Abduction x10 bil;     Other Sidelying Exercise  Shoulder horizontal abd x10 bil;      Manual Therapy   Manual Therapy  Joint mobilization;Soft tissue mobilization;Manual Traction;Passive ROM    Joint Mobilization  PA mobs lower c-spine, grade 3;  Side glides  c-spine;     Soft tissue mobilization  STM to bil UT, levator, light STM to Bil sub occipitals    Passive ROM  sub occipital release/ light     Manual Traction  --      Neck Exercises: Stretches   Upper Trapezius Stretch  2 reps;30 seconds    Levator Stretch  2 reps;30 seconds               PT Short Term Goals - 09/11/18 1232      PT SHORT TERM GOAL #1   Title  Pt to be independent with initial HEP     Time  2    Period  Weeks    Status  New    Target Date  09/20/18      PT SHORT TERM GOAL #2   Title  Pt to report decreased pain to 3/10 in neck/upper back    Time  2    Period  Weeks    Status  New    Target Date  09/20/18        PT Long Term Goals - 09/11/18 1233      PT LONG TERM GOAL #1   Title  Pt to be independent with long term HEP     Time  6    Period  Weeks    Status  New    Target Date  10/18/18      PT LONG TERM GOAL #2   Title  Pt to report decreased pain to 0-2/10 with activity    Time  6    Period  Weeks    Status  New    Target Date  10/18/18      PT LONG TERM GOAL #3   Title  Pt to demo improved cervical ROM , to be WNL and pain free , to improve ability for driving and IADLS.     Time  6    Period  Weeks    Status  New    Target Date   10/18/18      PT LONG TERM GOAL #4   Title  Pt to demo improved strength of R shoulder and postural muscles, to be at least 4+/5 to improve stability and pain.     Time  6    Period  Weeks    Status  New    Target Date  10/18/18            Plan - 09/13/18 1023    Clinical Impression Statement  Pt very sensitive to manual therapy. She has soreness with very light STM today. Decreased manual therapy and distraction, in hopes to decrease pts increase in symptoms after last visit, will assess next week. Pt requires Max verbal and tactile cueing for all ther ex.  Pt with several aches and pains in various parts of body throughout session: ie; she states increased LBP with passive cervical flexion in supine. Plan to progress slowly with pt for pain relief and strengthening.     Rehab Potential  Good    PT Frequency  2x / week    PT Duration  6 weeks    PT Treatment/Interventions  ADLs/Self Care Home Management;Cryotherapy;Electrical Stimulation;Moist Heat;Iontophoresis 4mg /ml Dexamethasone;Therapeutic activities;Functional mobility training;Ultrasound;Traction;Therapeutic exercise;Neuromuscular re-education;Patient/family education;Dry needling;Passive range of motion;Manual techniques;Taping    Consulted and Agree with Plan of Care  Patient       Patient will benefit from skilled therapeutic intervention in order to improve the following deficits and impairments:  Hypomobility, Decreased activity tolerance, Decreased strength, Pain, Increased muscle spasms, Decreased mobility, Decreased range of motion, Improper body mechanics  Visit Diagnosis: Neck pain  Problem List Patient Active Problem List   Diagnosis Date Noted  . Posterior vitreous detachment of right eye 09/10/2018  . History of IBS 01/30/2018  . Urolithiasis 06/23/2017  . Fibromyalgia 06/04/2017  . Chronic fatigue 06/04/2017  . Medication intolerance 06/04/2017  . ANA positive 05/20/2017  . Vasomotor rhinitis  05/20/2017  . Serum calcium elevated 05/20/2017  . Interstitial cystitis 05/20/2017  . Dyspepsia 05/20/2017  . Mixed hyperlipidemia 04/06/2017  . Elevated BP without diagnosis of hypertension 04/06/2017  . Anxiety 04/06/2017  . Dry eyes 04/06/2017    Lyndee Hensen, PT, DPT 10:29 AM  09/13/18    [facility] [place], [place], [address] Phone: [phone]   Fax:  [phone]  Name: Aurea Aronov MRN: [id number] Date of Birth: November 11, 1951

## 2018-09-17 ENCOUNTER — Encounter: Payer: PPO | Admitting: Physical Therapy

## 2018-09-17 MED ORDER — PROPRANOLOL HCL 10 MG PO TABS: 10.0000 mg | ORAL_TABLET | Freq: Every day | ORAL | 3 refills | Status: DC | PRN

## 2018-09-17 NOTE — Telephone Encounter (Signed)
Pt has been notified of monitor results by phone. Pt has been advised of recommendations (decrease caffeine, decongestants, chocolate) and is agreeable to plan of care. Rx has been sent for Propranolol 10 mg daily PRN for palpitations. I did advise pt if she feels the Propanolol is not working for her to please let us know, otherwise we will see her in 6 months. Advised we will send out a reminder letter for appt 6 months. Pt thanked me for the call.

## 2018-09-18 ENCOUNTER — Encounter: Payer: PPO | Admitting: Physical Therapy

## 2018-09-20 ENCOUNTER — Encounter: Payer: Self-pay | Admitting: Physical Therapy

## 2018-09-20 ENCOUNTER — Ambulatory Visit (INDEPENDENT_AMBULATORY_CARE_PROVIDER_SITE_OTHER): Payer: PPO | Admitting: Physical Therapy

## 2018-09-20 DIAGNOSIS — M542 Cervicalgia: Secondary | ICD-10-CM

## 2018-09-20 NOTE — Therapy (Signed)
[facility] [address] [place], [place], [address] Phone: [phone]   Fax:  [phone]  Physical Therapy Treatment  Patient Details  Name: Natalie Erickson MRN: [id number] Date of Birth: 08/22/1951 Referring Provider: Briscoe Deutscher   Encounter Date: 09/20/2018  PT End of Session - 09/20/18 1122    Visit Number  4    Number of Visits  12    Date for PT Re-Evaluation  10/18/18    Authorization Type  HTA    PT Start Time  1100    PT Stop Time  1145    PT Time Calculation (min)  45 min    Activity Tolerance  Patient tolerated treatment well    Behavior During Therapy  [facility] for tasks assessed/performed       Past Medical History:  Diagnosis Date  . Anxiety 04/06/2017  . Cystitis   . Depression   . Dry eyes 04/06/2017  . Elevated BP without diagnosis of hypertension 04/06/2017   lost weight >> BP improved  . Fibromyalgia   . Heart murmur    hx of 2 Echocardiograms in [place] (pt was told they were normal)  . Hiatal hernia   . History of colon polyps   . History of echocardiogram    Echo 9/19: mild LVH, EF 60-65, no RWMA, Gr 1 DD, trivial MR/TR, PASP 16  . History of Lyme disease   . IBS (irritable bowel syndrome)   . Mixed hyperlipidemia 04/06/2017   intol to statins due to myalgias; never tried Zetia  . TMJ (dislocation of temporomandibular joint)   . Vasomotor rhinitis     Past Surgical History:  Procedure Laterality Date  . APPENDECTOMY  1959  . LAPAROSCOPY    . LEFT OOPHORECTOMY      There were no vitals filed for this visit.  Subjective Assessment - 09/20/18 1121    Subjective  Pt states that she has had much improved pain. She has been able to return to yoga and [place] this week.     Currently in Pain?  Yes    Pain Score  2     Pain Location  Neck    Pain Orientation  Right    Pain Descriptors / Indicators  Aching;Tightness    Pain Type  Acute pain    Pain Onset  More than a month ago    Pain  Frequency  Intermittent                       [facility] Adult PT Treatment/Exercise - 09/20/18 1100      Exercises   Exercises  Neck      Neck Exercises: Theraband   Scapula Retraction  20 reps;Red      Neck Exercises: Standing   Wall Wash  x10 bil; at door      Neck Exercises: Seated   Neck Retraction  20 reps    Other Seated Exercise  TMJ- controlled opening and clucking x15 each;       Neck Exercises: Sidelying   Other Sidelying Exercise  Shoulder Abduction x10 bil;     Other Sidelying Exercise  Shoulder flexion AAROM with cane x20;       Manual Therapy   Manual Therapy  Joint mobilization;Soft tissue mobilization;Manual Traction;Passive ROM    Joint Mobilization  PA mobs lower c-spine, grade 3;  Side glides  c-spine;     Soft tissue mobilization  Manual UT stretching    Passive  ROM  sub occipital release/ light     Manual Traction  10 sec x6 light      Neck Exercises: Stretches   Upper Trapezius Stretch  --    Levator Stretch  --               PT Short Term Goals - 09/11/18 1232      PT SHORT TERM GOAL #1   Title  Pt to be independent with initial HEP     Time  2    Period  Weeks    Status  New    Target Date  09/20/18      PT SHORT TERM GOAL #2   Title  Pt to report decreased pain to 3/10 in neck/upper back    Time  2    Period  Weeks    Status  New    Target Date  09/20/18        PT Long Term Goals - 09/11/18 1233      PT LONG TERM GOAL #1   Title  Pt to be independent with long term HEP     Time  6    Period  Weeks    Status  New    Target Date  10/18/18      PT LONG TERM GOAL #2   Title  Pt to report decreased pain to 0-2/10 with activity    Time  6    Period  Weeks    Status  New    Target Date  10/18/18      PT LONG TERM GOAL #3   Title  Pt to demo improved cervical ROM , to be WNL and pain free , to improve ability for driving and IADLS.     Time  6    Period  Weeks    Status  New    Target Date  10/18/18       PT LONG TERM GOAL #4   Title  Pt to demo improved strength of R shoulder and postural muscles, to be at least 4+/5 to improve stability and pain.     Time  6    Period  Weeks    Status  New    Target Date  10/18/18            Plan - 09/20/18 1203    Clinical Impression Statement  Pt with much decreased soreness today with palpation and with movement. Improved ability for ther ex, due to improved pain. HEp updated. Recommended not over doing activity this week. Plan to progress strength and mobility as pain continues to decrease.     Rehab Potential  Good    PT Frequency  2x / week    PT Duration  6 weeks    PT Treatment/Interventions  ADLs/Self Care Home Management;Cryotherapy;Electrical Stimulation;Moist Heat;Iontophoresis 4mg /ml Dexamethasone;Therapeutic activities;Functional mobility training;Ultrasound;Traction;Therapeutic exercise;Neuromuscular re-education;Patient/family education;Dry needling;Passive range of motion;Manual techniques;Taping    Consulted and Agree with Plan of Care  Patient       Patient will benefit from skilled therapeutic intervention in order to improve the following deficits and impairments:  Hypomobility, Decreased activity tolerance, Decreased strength, Pain, Increased muscle spasms, Decreased mobility, Decreased range of motion, Improper body mechanics  Visit Diagnosis: Neck pain     Problem List Patient Active Problem List   Diagnosis Date Noted  . Posterior vitreous detachment of right eye 09/10/2018  . History of IBS 01/30/2018  . Urolithiasis 06/23/2017  . Fibromyalgia 06/04/2017  .  Chronic fatigue 06/04/2017  . Medication intolerance 06/04/2017  . ANA positive 05/20/2017  . 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 Serum calcium elevated 05/20/2017  . Interstitial cystitis 05/20/2017  . Dyspepsia 05/20/2017  . Mixed hyperlipidemia 04/06/2017  . Elevated BP without diagnosis of hypertension 04/06/2017  . Anxiety 04/06/2017  . Dry eyes  04/06/2017    Lyndee Hensen, PT, DPT 12:04 PM  09/20/18    [facility] [place], [place], [address] Phone: [phone]   Fax:  [phone]  Name: Kambrey Hagger MRN: [id number] Date of Birth: May 12, 1951

## 2018-09-24 ENCOUNTER — Encounter: Payer: Self-pay | Admitting: Physical Therapy

## 2018-09-24 ENCOUNTER — Ambulatory Visit (INDEPENDENT_AMBULATORY_CARE_PROVIDER_SITE_OTHER): Payer: PPO | Admitting: Physical Therapy

## 2018-09-24 DIAGNOSIS — M542 Cervicalgia: Secondary | ICD-10-CM

## 2018-09-24 NOTE — Therapy (Signed)
[facility] [place], [place], [address] Phone: [phone]   Fax:  [phone]  Physical Therapy Treatment  Patient Details  Name: Natalie Erickson MRN: [id number] Date of Birth: 10/09/1951 Referring Provider (PT): Briscoe Deutscher   Encounter Date: 09/24/2018  PT End of Session - 09/24/18 1205    Visit Number  5    Number of Visits  12    Date for PT Re-Evaluation  10/18/18    Authorization Type  HTA    PT Start Time  0800    PT Stop Time  0843    PT Time Calculation (min)  43 min    Activity Tolerance  Patient tolerated treatment well    Behavior During Therapy  [facility] for tasks assessed/performed       Past Medical History:  Diagnosis Date  . Anxiety 04/06/2017  . Cystitis   . Depression   . Dry eyes 04/06/2017  . Elevated BP without diagnosis of hypertension 04/06/2017   lost weight >> BP improved  . Fibromyalgia   . Heart murmur    hx of 2 Echocardiograms in [place] (pt was told they were normal)  . Hiatal hernia   . History of colon polyps   . History of echocardiogram    Echo 9/19: mild LVH, EF 60-65, no RWMA, Gr 1 DD, trivial MR/TR, PASP 16  . History of Lyme disease   . IBS (irritable bowel syndrome)   . Mixed hyperlipidemia 04/06/2017   intol to statins due to myalgias; never tried Zetia  . TMJ (dislocation of temporomandibular joint)   . Vasomotor rhinitis     Past Surgical History:  Procedure Laterality Date  . APPENDECTOMY  1959  . LAPAROSCOPY    . LEFT OOPHORECTOMY      There were no vitals filed for this visit.  Subjective Assessment - 09/24/18 1203    Subjective  Pt states continued improvements in pain. She still has soreness in mid cervical region, but is less than previous weeks. She has been doing HEP    Patient Stated Goals  Decreased pain    Currently in Pain?  Yes    Pain Score  2     Pain Location  Neck    Pain Orientation  Right    Pain Descriptors / Indicators   Aching;Tightness    Pain Type  Acute pain    Pain Onset  More than a month ago    Pain Frequency  Intermittent                       [facility] Adult PT Treatment/Exercise - 09/24/18 0806      Exercises   Exercises  Neck      Neck Exercises: Theraband   Scapula Retraction  20 reps;Red      Neck Exercises: Standing   Wall Wash  x10 bil; at door    Other Standing Exercises  Shoulder abd and scaption, to 90 deg, 1 lb x10 each;       Neck Exercises: Seated   Neck Retraction  20 reps    Other Seated Exercise  --    Other Seated Exercise  Overhead press 1lb x10      Neck Exercises: Supine   Neck Retraction  10 reps    Other Supine Exercise  Horizontal Abd 1lb x15;       Neck Exercises: Sidelying   Other Sidelying Exercise  --    Other Sidelying Exercise  --  Manual Therapy   Manual Therapy  Joint mobilization;Soft tissue mobilization;Manual Traction;Passive ROM    Joint Mobilization  PA mobs lower c-spine, grade 3;  Side glides  c-spine;     Soft tissue mobilization  Manual UT stretching    Passive ROM  sub occipital release/ light     Manual Traction  --             PT Education - 09/24/18 1205    Education Details  HEp review     Person(s) Educated  Patient    Methods  Explanation    Comprehension  Verbalized understanding       PT Short Term Goals - 09/24/18 1206      PT SHORT TERM GOAL #1   Title  Pt to be independent with initial HEP     Time  2    Period  Weeks    Status  Achieved      PT SHORT TERM GOAL #2   Title  Pt to report decreased pain to 3/10 in neck/upper back    Time  2    Period  Weeks    Status  Achieved        PT Long Term Goals - 09/11/18 1233      PT LONG TERM GOAL #1   Title  Pt to be independent with long term HEP     Time  6    Period  Weeks    Status  New    Target Date  10/18/18      PT LONG TERM GOAL #2   Title  Pt to report decreased pain to 0-2/10 with activity    Time  6    Period  Weeks     Status  New    Target Date  10/18/18      PT LONG TERM GOAL #3   Title  Pt to demo improved cervical ROM , to be WNL and pain free , to improve ability for driving and IADLS.     Time  6    Period  Weeks    Status  New    Target Date  10/18/18      PT LONG TERM GOAL #4   Title  Pt to demo improved strength of R shoulder and postural muscles, to be at least 4+/5 to improve stability and pain.     Time  6    Period  Weeks    Status  New    Target Date  10/18/18            Plan - 09/24/18 1208    Clinical Impression Statement  Pt with decreased tenderness in high cervical region, continues to have tightness and tenderness in bilateral upper trap region. Pt able to progress postural strengthenging today without increased pain. Plan to progress as tolerated.     Rehab Potential  Good    PT Frequency  2x / week    PT Duration  6 weeks    PT Treatment/Interventions  ADLs/Self Care Home Management;Cryotherapy;Electrical Stimulation;Moist Heat;Iontophoresis 4mg /ml Dexamethasone;Therapeutic activities;Functional mobility training;Ultrasound;Traction;Therapeutic exercise;Neuromuscular re-education;Patient/family education;Dry needling;Passive range of motion;Manual techniques;Taping    Consulted and Agree with Plan of Care  Patient       Patient will benefit from skilled therapeutic intervention in order to improve the following deficits and impairments:  Hypomobility, Decreased activity tolerance, Decreased strength, Pain, Increased muscle spasms, Decreased mobility, Decreased range of motion, Improper body mechanics  Visit Diagnosis: Neck pain  Problem List Patient Active Problem List   Diagnosis Date Noted  . Posterior vitreous detachment of right eye 09/10/2018  . History of IBS 01/30/2018  . Urolithiasis 06/23/2017  . Fibromyalgia 06/04/2017  . Chronic fatigue 06/04/2017  . Medication intolerance 06/04/2017  . ANA positive 05/20/2017  . 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   . Serum calcium elevated 05/20/2017  . Interstitial cystitis 05/20/2017  . Dyspepsia 05/20/2017  . Mixed hyperlipidemia 04/06/2017  . Elevated BP without diagnosis of hypertension 04/06/2017  . Anxiety 04/06/2017  . Dry eyes 04/06/2017   Lyndee Hensen, PT, DPT 12:09 PM  09/24/18    [facility] [place], [place], [address] Phone: [phone]   Fax:  [phone]  Name: Natalie Erickson MRN: [id number] Date of Birth: 04/20/1951

## 2018-09-27 ENCOUNTER — Encounter: Payer: Self-pay | Admitting: Physical Therapy

## 2018-09-27 ENCOUNTER — Ambulatory Visit (INDEPENDENT_AMBULATORY_CARE_PROVIDER_SITE_OTHER): Payer: PPO | Admitting: Physical Therapy

## 2018-09-27 DIAGNOSIS — M542 Cervicalgia: Secondary | ICD-10-CM

## 2018-09-27 NOTE — Therapy (Signed)
[facility] [address] [place], [place], [address] Phone: [phone]   Fax:  [phone]  Physical Therapy Treatment  Patient Details  Name: Natalie Erickson MRN: [id number] Date of Birth: 1951-11-01 Referring Provider (PT): Briscoe Deutscher   Encounter Date: 09/27/2018  PT End of Session - 09/27/18 1154    Visit Number  6    Number of Visits  12    Date for PT Re-Evaluation  10/18/18    Authorization Type  HTA    PT Start Time  0806    PT Stop Time  0844    PT Time Calculation (min)  38 min    Activity Tolerance  Patient tolerated treatment well    Behavior During Therapy  [facility] for tasks assessed/performed       Past Medical History:  Diagnosis Date  . Anxiety 04/06/2017  . Cystitis   . Depression   . Dry eyes 04/06/2017  . Elevated BP without diagnosis of hypertension 04/06/2017   lost weight >> BP improved  . Fibromyalgia   . Heart murmur    hx of 2 Echocardiograms in [place] (pt was told they were normal)  . Hiatal hernia   . History of colon polyps   . History of echocardiogram    Echo 9/19: mild LVH, EF 60-65, no RWMA, Gr 1 DD, trivial MR/TR, PASP 16  . History of Lyme disease   . IBS (irritable bowel syndrome)   . Mixed hyperlipidemia 04/06/2017   intol to statins due to myalgias; never tried Zetia  . TMJ (dislocation of temporomandibular joint)   . Vasomotor rhinitis     Past Surgical History:  Procedure Laterality Date  . APPENDECTOMY  1959  . LAPAROSCOPY    . LEFT OOPHORECTOMY      There were no vitals filed for this visit.  Subjective Assessment - 09/27/18 0823    Subjective  Pt states "my neck is just stiff, its so much better"     Currently in Pain?  Yes    Pain Score  1     Pain Location  Neck    Pain Orientation  Right    Pain Descriptors / Indicators  Tightness    Pain Type  Acute pain    Pain Onset  More than a month ago    Pain Frequency  Intermittent                        [facility] Adult PT Treatment/Exercise - 09/27/18 0806      Exercises   Exercises  Neck      Neck Exercises: Theraband   Scapula Retraction  20 reps;Green      Neck Exercises: Standing   Wall Wash  --    Other Standing Exercises  Shoulder abd and scaption, to 90 deg, 1 lb x10 each;     Other Standing Exercises  Shoulder ER RTB x15 each bil;       Neck Exercises: Seated   Neck Retraction  20 reps    Other Seated Exercise  Overhead press 1lb x10      Neck Exercises: Supine   Neck Retraction  --    Other Supine Exercise  Horizontal Abd 1lb x15;       Manual Therapy   Manual Therapy  Joint mobilization;Soft tissue mobilization;Manual Traction;Passive ROM    Manual therapy comments  STM/DTM to bil UT and cervical paraspinals;     Joint Mobilization  --  Soft tissue mobilization  Manual UT stretching    Passive ROM  sub occipital release/ light       Neck Exercises: Stretches   Corner Stretch  3 reps;30 seconds    Lower Cervical/Upper Thoracic Stretch  30 seconds;2 reps               PT Short Term Goals - 09/24/18 1206      PT SHORT TERM GOAL #1   Title  Pt to be independent with initial HEP     Time  2    Period  Weeks    Status  Achieved      PT SHORT TERM GOAL #2   Title  Pt to report decreased pain to 3/10 in neck/upper back    Time  2    Period  Weeks    Status  Achieved        PT Long Term Goals - 09/11/18 1233      PT LONG TERM GOAL #1   Title  Pt to be independent with long term HEP     Time  6    Period  Weeks    Status  New    Target Date  10/18/18      PT LONG TERM GOAL #2   Title  Pt to report decreased pain to 0-2/10 with activity    Time  6    Period  Weeks    Status  New    Target Date  10/18/18      PT LONG TERM GOAL #3   Title  Pt to demo improved cervical ROM , to be WNL and pain free , to improve ability for driving and IADLS.     Time  6    Period  Weeks    Status  New    Target Date   10/18/18      PT LONG TERM GOAL #4   Title  Pt to demo improved strength of R shoulder and postural muscles, to be at least 4+/5 to improve stability and pain.     Time  6    Period  Weeks    Status  New    Target Date  10/18/18            Plan - 09/27/18 1155    Clinical Impression Statement  Pt with improving pain. She has continued tightness in bil UT region, may benefit from dry needling to improve. Pt able to progress ther ex with good mechanics and no increased pain.     Rehab Potential  Good    PT Frequency  2x / week    PT Duration  6 weeks    PT Treatment/Interventions  ADLs/Self Care Home Management;Cryotherapy;Electrical Stimulation;Moist Heat;Iontophoresis 4mg /ml Dexamethasone;Therapeutic activities;Functional mobility training;Ultrasound;Traction;Therapeutic exercise;Neuromuscular re-education;Patient/family education;Dry needling;Passive range of motion;Manual techniques;Taping    Consulted and Agree with Plan of Care  Patient       Patient will benefit from skilled therapeutic intervention in order to improve the following deficits and impairments:  Hypomobility, Decreased activity tolerance, Decreased strength, Pain, Increased muscle spasms, Decreased mobility, Decreased range of motion, Improper body mechanics  Visit Diagnosis: Neck pain     Problem List Patient Active Problem List   Diagnosis Date Noted  . Posterior vitreous detachment of right eye 09/10/2018  . History of IBS 01/30/2018  . Urolithiasis 06/23/2017  . Fibromyalgia 06/04/2017  . Chronic fatigue 06/04/2017  . Medication intolerance 06/04/2017  . ANA positive 05/20/2017  . Vasomotor  rhinitis 05/20/2017  . Serum calcium elevated 05/20/2017  . Interstitial cystitis 05/20/2017  . Dyspepsia 05/20/2017  . Mixed hyperlipidemia 04/06/2017  . Elevated BP without diagnosis of hypertension 04/06/2017  . Anxiety 04/06/2017  . Dry eyes 04/06/2017    Lyndee Hensen, PT, DPT 11:56 AM   09/27/18    [facility] [place], [place], [address] Phone: [phone]   Fax:  [phone]  Name: Natalie Erickson MRN: [id number] Date of Birth: 02-28-1951

## 2018-10-01 ENCOUNTER — Encounter: Payer: Self-pay | Admitting: Physical Therapy

## 2018-10-01 ENCOUNTER — Ambulatory Visit (INDEPENDENT_AMBULATORY_CARE_PROVIDER_SITE_OTHER): Payer: PPO | Admitting: Physical Therapy

## 2018-10-01 DIAGNOSIS — M542 Cervicalgia: Secondary | ICD-10-CM

## 2018-10-01 NOTE — Therapy (Signed)
[facility] [address] [place], [place], [address] Phone: [phone]   Fax:  [phone]  Physical Therapy Treatment  Patient Details  Name: Natalie Erickson MRN: [id number] Date of Birth: [birth year] Referring Provider (PT): Briscoe Deutscher   Encounter Date: 10/01/2018  PT End of Session - 10/01/18 1107    Visit Number  7    Number of Visits  12    Date for PT Re-Evaluation  10/18/18    Authorization Type  HTA    PT Start Time  0803    PT Stop Time  0844    PT Time Calculation (min)  41 min    Activity Tolerance  Patient tolerated treatment well    Behavior During Therapy  [facility] for tasks assessed/performed       Past Medical History:  Diagnosis Date  . Anxiety 04/06/2017  . Cystitis   . Depression   . Dry eyes 04/06/2017  . Elevated BP without diagnosis of hypertension 04/06/2017   lost weight >> BP improved  . Fibromyalgia   . Heart murmur    hx of 2 Echocardiograms in [place] (pt was told they were normal)  . Hiatal hernia   . History of colon polyps   . History of echocardiogram    Echo 9/19: mild LVH, EF 60-65, no RWMA, Gr 1 DD, trivial MR/TR, PASP 16  . History of Lyme disease   . IBS (irritable bowel syndrome)   . Mixed hyperlipidemia 04/06/2017   intol to statins due to myalgias; never tried Zetia  . TMJ (dislocation of temporomandibular joint)   . Vasomotor rhinitis     Past Surgical History:  Procedure Laterality Date  . APPENDECTOMY  1959  . LAPAROSCOPY    . LEFT OOPHORECTOMY      There were no vitals filed for this visit.  Subjective Assessment - 10/01/18 1106    Subjective  Pt states she is stiff, but doing better.     Currently in Pain?  Yes    Pain Score  1     Pain Location  Neck    Pain Descriptors / Indicators  Tightness    Pain Type  Acute pain    Pain Onset  More than a month ago    Pain Frequency  Intermittent                       [facility] Adult PT Treatment/Exercise -  10/01/18 0808      Exercises   Exercises  Neck      Neck Exercises: Theraband   Scapula Retraction  20 reps;Green      Neck Exercises: Standing   Other Standing Exercises  --    Other Standing Exercises  Shoulder ER RTB x15 each bil;       Neck Exercises: Seated   Neck Retraction  20 reps    Other Seated Exercise  cervical extension ROM x10;     Other Seated Exercise  Overhead press 2lb x10      Neck Exercises: Supine   Other Supine Exercise  Horizontal Abd 2lb x15;       Manual Therapy   Manual Therapy  Joint mobilization;Soft tissue mobilization;Manual Traction;Passive ROM    Manual therapy comments  STM/DTM to bil UT and cervical paraspinals, anterior cervical musculature,     Soft tissue mobilization  Manual UT stretching    Passive ROM  PROM for cervical extension;       Neck Exercises:  Stretches   Warehouse manager  3 reps;30 seconds    Lower Cervical/Upper Thoracic Stretch  30 seconds;2 reps               PT Short Term Goals - 09/24/18 1206      PT SHORT TERM GOAL #1   Title  Pt to be independent with initial HEP     Time  2    Period  Weeks    Status  Achieved      PT SHORT TERM GOAL #2   Title  Pt to report decreased pain to 3/10 in neck/upper back    Time  2    Period  Weeks    Status  Achieved        PT Long Term Goals - 09/11/18 1233      PT LONG TERM GOAL #1   Title  Pt to be independent with long term HEP     Time  6    Period  Weeks    Status  New    Target Date  10/18/18      PT LONG TERM GOAL #2   Title  Pt to report decreased pain to 0-2/10 with activity    Time  6    Period  Weeks    Status  New    Target Date  10/18/18      PT LONG TERM GOAL #3   Title  Pt to demo improved cervical ROM , to be WNL and pain free , to improve ability for driving and IADLS.     Time  6    Period  Weeks    Status  New    Target Date  10/18/18      PT LONG TERM GOAL #4   Title  Pt to demo improved strength of R shoulder and postural muscles,  to be at least 4+/5 to improve stability and pain.     Time  6    Period  Weeks    Status  New    Target Date  10/18/18            Plan - 10/01/18 1109    Clinical Impression Statement  Pt showing good improvements with pain. She does have tightnesss in anterior musculature, that she feels pulling with cervical extension ROM. Will review final HEP next visit, possible d/c then.     Rehab Potential  Good    PT Frequency  2x / week    PT Duration  6 weeks    PT Treatment/Interventions  ADLs/Self Care Home Management;Cryotherapy;Electrical Stimulation;Moist Heat;Iontophoresis 4mg /ml Dexamethasone;Therapeutic activities;Functional mobility training;Ultrasound;Traction;Therapeutic exercise;Neuromuscular re-education;Patient/family education;Dry needling;Passive range of motion;Manual techniques;Taping    Consulted and Agree with Plan of Care  Patient       Patient will benefit from skilled therapeutic intervention in order to improve the following deficits and impairments:  Hypomobility, Decreased activity tolerance, Decreased strength, Pain, Increased muscle spasms, Decreased mobility, Decreased range of motion, Improper body mechanics  Visit Diagnosis: Neck pain     Problem List Patient Active Problem List   Diagnosis Date Noted  . Posterior vitreous detachment of right eye 09/10/2018  . History of IBS 01/30/2018  . Urolithiasis 06/23/2017  . Fibromyalgia 06/04/2017  . Chronic fatigue 06/04/2017  . Medication intolerance 06/04/2017  . ANA positive 05/20/2017  . 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 Serum calcium elevated 05/20/2017  . Interstitial cystitis 05/20/2017  . Dyspepsia 05/20/2017  . Mixed hyperlipidemia 04/06/2017  . Elevated BP without  diagnosis of hypertension 04/06/2017  . Anxiety 04/06/2017  . Dry eyes 04/06/2017    Lyndee Hensen, PT, DPT 11:10 AM  10/01/18    [facility] [place], [place],  [address] Phone: [phone]   Fax:  [phone]  Name: Mateya Torti MRN: [id number] Date of Birth: November 25, 1951

## 2018-10-04 ENCOUNTER — Encounter: Payer: Self-pay | Admitting: Physical Therapy

## 2018-10-04 ENCOUNTER — Ambulatory Visit (INDEPENDENT_AMBULATORY_CARE_PROVIDER_SITE_OTHER): Payer: PPO | Admitting: Physical Therapy

## 2018-10-04 DIAGNOSIS — M542 Cervicalgia: Secondary | ICD-10-CM | POA: Diagnosis not present

## 2018-10-04 NOTE — Therapy (Signed)
[facility] [address] [place], [place], [address] Phone: [phone]   Fax:  [phone]  Physical Therapy Treatment/Discharge  Patient Details  Name: Natalie Erickson MRN: [id number] Date of Birth: 08/17/1951 Referring Provider (PT): Briscoe Deutscher   Encounter Date: 10/04/2018  PT End of Session - 10/04/18 0816    Visit Number  8    Number of Visits  12    Date for PT Re-Evaluation  10/18/18    Authorization Type  HTA    PT Start Time  0803    PT Stop Time  0844    PT Time Calculation (min)  41 min    Activity Tolerance  Patient tolerated treatment well    Behavior During Therapy  [facility] for tasks assessed/performed       Past Medical History:  Diagnosis Date  . Anxiety 04/06/2017  . Cystitis   . Depression   . Dry eyes 04/06/2017  . Elevated BP without diagnosis of hypertension 04/06/2017   lost weight >> BP improved  . Fibromyalgia   . Heart murmur    hx of 2 Echocardiograms in [place] (pt was told they were normal)  . Hiatal hernia   . History of colon polyps   . History of echocardiogram    Echo 9/19: mild LVH, EF 60-65, no RWMA, Gr 1 DD, trivial MR/TR, PASP 16  . History of Lyme disease   . IBS (irritable bowel syndrome)   . Mixed hyperlipidemia 04/06/2017   intol to statins due to myalgias; never tried Zetia  . TMJ (dislocation of temporomandibular joint)   . Vasomotor rhinitis     Past Surgical History:  Procedure Laterality Date  . APPENDECTOMY  1959  . LAPAROSCOPY    . LEFT OOPHORECTOMY      There were no vitals filed for this visit.  Subjective Assessment - 10/04/18 0813    Subjective  Pt states minimal pain in last few days, in neck. She was able to do yoga and [place] chi yesterday.     Currently in Pain?  No/denies    Pain Score  0-No pain         [facility] PT Assessment - 10/04/18 0001      AROM   Overall AROM Comments  Cervical: flexion: WNL;  ext: mild deficit;  rotation: mild deficit;  / all  pain free;   Shoulder: WNL;       Strength   Overall Strength Comments  shoulder: 5/5  scapular 4+/5                    [facility] Adult PT Treatment/Exercise - 10/04/18 0803      Exercises   Exercises  Neck      Neck Exercises: Theraband   Scapula Retraction  20 reps;Green      Neck Exercises: Standing   Other Standing Exercises  Shoulder abd and scaption, to full ROM 2x10 bil;     Other Standing Exercises  Shoulder ER RTB x15 each bil;       Neck Exercises: Seated   Neck Retraction  20 reps    Other Seated Exercise  cervical extension ROM x10;     Other Seated Exercise  Overhead press 2lb x10      Neck Exercises: Supine   Other Supine Exercise  Horizontal Abd 2lb x15;     Other Supine Exercise  scap pull outs YTB x20;       Neck Exercises: Sidelying   Other  Sidelying Exercise  Shoulder Abduction x10 bil, 2 lb;       Manual Therapy   Manual Therapy  Joint mobilization;Soft tissue mobilization;Manual Traction;Passive ROM    Manual therapy comments  STM/DTM to bil UT and cervical paraspinals, anterior cervical musculature,     Joint Mobilization  PA mobs lower c-spine    Soft tissue mobilization  Manual UT stretching    Passive ROM  --      Neck Exercises: Stretches   Warehouse manager  --    Lower Office manager  --             PT Education - 10/04/18 0813    Education Details  Final HEP review.     Person(s) Educated  Patient    Methods  Explanation    Comprehension  Verbalized understanding       PT Short Term Goals - 09/24/18 1206      PT SHORT TERM GOAL #1   Title  Pt to be independent with initial HEP     Time  2    Period  Weeks    Status  Achieved      PT SHORT TERM GOAL #2   Title  Pt to report decreased pain to 3/10 in neck/upper back    Time  2    Period  Weeks    Status  Achieved        PT Long Term Goals - 10/04/18 9179      PT LONG TERM GOAL #1   Title  Pt to be independent with long term HEP     Time  6     Period  Weeks    Status  Achieved      PT LONG TERM GOAL #2   Title  Pt to report decreased pain to 0-2/10 with activity    Time  6    Period  Weeks    Status  Achieved      PT LONG TERM GOAL #3   Title  Pt to demo improved cervical ROM , to be WNL and pain free , to improve ability for driving and IADLS.     Time  6    Period  Weeks    Status  Achieved      PT LONG TERM GOAL #4   Title  Pt to demo improved strength of R shoulder and postural muscles, to be at least 4+/5 to improve stability and pain.     Time  6    Period  Weeks    Status  Achieved            Plan - 10/04/18 0848    Clinical Impression Statement  Pt has made good improvements and is ready for d/c to HEP. Final HEP reviewed in detail today, pt with good understanding. Pt with near full ROM, with no pain. She has mild tightness in UTs that she feels "pulling" with full rotation. She has improved shoulder ROM and strength of UEs. D/c at this time, pt in agreement wiht plan.     Rehab Potential  Good    PT Frequency  2x / week    PT Duration  6 weeks    PT Treatment/Interventions  ADLs/Self Care Home Management;Cryotherapy;Electrical Stimulation;Moist Heat;Iontophoresis 58m/ml Dexamethasone;Therapeutic activities;Functional mobility training;Ultrasound;Traction;Therapeutic exercise;Neuromuscular re-education;Patient/family education;Dry needling;Passive range of motion;Manual techniques;Taping    Consulted and Agree with Plan of Care  Patient       Patient will benefit from skilled therapeutic  intervention in order to improve the following deficits and impairments:  Hypomobility, Decreased activity tolerance, Decreased strength, Pain, Increased muscle spasms, Decreased mobility, Decreased range of motion, Improper body mechanics  Visit Diagnosis: Neck pain     Problem List Patient Active Problem List   Diagnosis Date Noted  . Posterior vitreous detachment of right eye 09/10/2018  . History of IBS  01/30/2018  . Urolithiasis 06/23/2017  . Fibromyalgia 06/04/2017  . Chronic fatigue 06/04/2017  . Medication intolerance 06/04/2017  . ANA positive 05/20/2017  . 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 Serum calcium elevated 05/20/2017  . Interstitial cystitis 05/20/2017  . Dyspepsia 05/20/2017  . Mixed hyperlipidemia 04/06/2017  . Elevated BP without diagnosis of hypertension 04/06/2017  . Anxiety 04/06/2017  . Dry eyes 04/06/2017   Lyndee Hensen, PT, DPT 8:49 AM  10/04/18    [facility] [place], [place], [address] Phone: [phone]   Fax:  [phone]  Name: Natalie Erickson MRN: [id number] Date of Birth: 10-23-1951   PHYSICAL THERAPY DISCHARGE SUMMARY  Visits from Start of Care: 8   Plan: Patient agrees to discharge.  Patient goals were met. Patient is being discharged due to meeting the stated rehab goals.  ?????      Lyndee Hensen, PT, DPT 8:50 AM  10/04/18

## 2018-10-04 NOTE — Patient Instructions (Signed)
Access Code: [email or web address]  URL: [email or web address]  Date: 10/01/2018  Prepared by: Lyndee Hensen   Exercises  Seated Cervical Retraction - 10 reps - 2 sets - 2x daily  Seated Cervical Rotation AROM - 10 reps - 2 sets - 2x daily  Seated Shoulder Rolls - 10 reps - 2 sets - 2x daily  Standing Scapular Retraction - 10 reps - 2 sets - 2x daily  Seated Cervical Sidebending Stretch - 3 sets - 30 hold - 2x daily  Sidelying Shoulder Abduction Palm Forward - 10 reps - 2 sets - 2x daily  Standing Shoulder Scaption - 10 reps - 2 sets - 1x daily  Scapular Retraction with Resistance - 10 reps - 2 sets - 2x daily  Shoulder External Rotation with Anchored Resistance - 15 reps - 1 sets - 1x daily  Supine Chest Flys - 10 reps - 1 sets - 1x daily  Supine Chest Stretch on Foam Roll - 3 reps - 30 hold - 2x daily

## 2018-10-11 ENCOUNTER — Ambulatory Visit: Payer: PPO

## 2018-10-12 ENCOUNTER — Encounter: Payer: Self-pay | Admitting: Family Medicine

## 2018-10-12 ENCOUNTER — Telehealth: Payer: Self-pay | Admitting: Physician Assistant

## 2018-10-12 ENCOUNTER — Ambulatory Visit (INDEPENDENT_AMBULATORY_CARE_PROVIDER_SITE_OTHER): Payer: PPO | Admitting: Surgical

## 2018-10-12 DIAGNOSIS — Z23 Encounter for immunization: Secondary | ICD-10-CM | POA: Diagnosis not present

## 2018-10-12 NOTE — Telephone Encounter (Signed)
Spoke with the patient. She is dealing with constipation. She is treating her self with her "natural" remedies. Complains of a sore throat. She says the sore throat is the worst when she wakes up in the morning. She feels this especially during the night. She get temporary relief using a medication with simethicone in it. Asks if I am familiar with slippery elm or marshmallow root for treatment of these problems. I am not familiar with natural remedies or supplements. Patient has already made an appointment for discussion of her symptoms.

## 2018-10-12 NOTE — Telephone Encounter (Signed)
Pt states that her IBS has gotten worse. She would like some advise. Pls call her.

## 2018-10-16 ENCOUNTER — Ambulatory Visit: Payer: PPO | Admitting: Physician Assistant

## 2018-10-16 ENCOUNTER — Encounter: Payer: Self-pay | Admitting: Physician Assistant

## 2018-10-16 VITALS — BP 126/72 | HR 80 | Ht 62.0 in | Wt 134.6 lb

## 2018-10-16 DIAGNOSIS — K581 Irritable bowel syndrome with constipation: Secondary | ICD-10-CM | POA: Diagnosis not present

## 2018-10-16 DIAGNOSIS — F411 Generalized anxiety disorder: Secondary | ICD-10-CM | POA: Diagnosis not present

## 2018-10-16 DIAGNOSIS — B370 Candidal stomatitis: Secondary | ICD-10-CM | POA: Diagnosis not present

## 2018-10-16 MED ORDER — NYSTATIN 100000 UNIT/ML MT SUSP: OROMUCOSAL | 1 refills | Status: DC

## 2018-10-16 MED ORDER — ESCITALOPRAM OXALATE 10 MG PO TABS: ORAL_TABLET | ORAL | 2 refills | Status: DC

## 2018-10-16 NOTE — Progress Notes (Signed)
Subjective:    Patient ID: Natalie Erickson, female    DOB: Mar 26, 1951, 67 y.o.   MRN: [id number]  HPI Natalie Erickson is a 67 year old white female known to Dr. Loletha Carrow and myself.  She was last seen in the office in February 2019 and was doing well at that time.  She had been following a low FODMAP diet, taking daily probiotics, avoiding dairy pork and [place]. She has history of IBS, constipation, and had had a prolonged illness about 2 years ago, likely viral with extensive work-up which was unrevealing.  She had development of nausea, lack of appetite weight loss severe fatigue generalized weakness, abdominal discomfort bloating etc. which persisted for months, and then gradually abated.  She has been dealing with IBS symptoms since then. She also has history of chronic anxiety, fibromyalgia, chronic fatigue syndrome and multiple medication and food sensitivities. Last colonoscopy 2016 history of colon polyps and is indicated for 5-year interval follow-up.   She comes in today stating that she had been doing well for several months and has been gradually been branching out a bit on her diet which is gone fairly well.  She continues to avoid dairy and pork she is no longer following a low FODMAP diet, but continues to avoid nightshade vegetables long and garlic etc.    She made the appointment after she had developed an episode of significant constipation.  She had been off of her probiotic and has since started back on triphala, which has helped quite a bit and constipation has resolved. She says she is been having some increase in anxiety recently and has not been sleeping well.  She is being bothered by some gassy, bloated discomfort in her upper abdomen at night which she thinks may be related to anxiety. He had a back injury with pulled muscle a couple of months ago, had to decrease her activities etc. and says that stirred up bad memories of being sick a couple of years ago which makes her very  anxious. He did have CT of the abdomen and pelvis in June 2019 for follow-up of a small right renal lesion.  This showed multiple kidney stones and a benign hemorrhagic lesion in the right kidney about 8 mm in size stable.  She has taken Lexapro in the past with good success. She has also developed thrush on her tongue has had some irritation in her throat over the past few weeks.  She has not tried any over-the-counter medicines for this .  No recent antibiotics.  Review of Systems Pertinent positive and negative review of systems were noted in the above HPI section.  All other review of systems was otherwise negative.  Outpatient Encounter Medications as of 10/16/2018  Medication Sig  . acetaminophen (TYLENOL) 500 MG tablet Take 1,000 mg by mouth every 6 (six) hours as needed for headache (pain).  . AMBULATORY NON FORMULARY MEDICATION Magnesium oil Apply to legs as needed  . AMBULATORY NON FORMULARY MEDICATION Medication Name: triphala 1000 mg a day  . AMBULATORY NON FORMULARY MEDICATION Medication Name: probiotic ultimate flora  . Calcium & Magnesium Carbonates (MYLANTA PO) Take 20 mLs by mouth as directed.   . Cholecalciferol (VITAMIN D) 2000 units tablet Take 2,000 Units by mouth daily.   Marland Kitchen MAGNESIUM GLUCONATE PO Take by mouth as directed.   . NON FORMULARY   . Polyethyl Glycol-Propyl Glycol (SYSTANE ULTRA OP) Apply to eye 4 (four) times daily.  . Simethicone (GAS-X PO) Take by mouth as directed.   Marland Kitchen  escitalopram (LEXAPRO) 10 MG tablet Take 1 tablet by mouth daily.  Marland Kitchen nystatin (MYCOSTATIN) 100000 UNIT/ML suspension Take 5 cc's by mouth, swish and swallow, 4 times daily for 14 days.  . propranolol (INDERAL) 10 MG tablet Take 1 tablet (10 mg total) by mouth daily as needed (PALPITATIONS). (Patient not taking: Reported on 10/16/2018)   No facility-administered encounter medications on file as of 10/16/2018.    Allergies  Allergen Reactions  . Statins Tinitus    Achy joints, muscle  aches  . Banana Other (See Comments)    migraine  . Chocolate Other (See Comments)    migraine  . Codeine Nausea And Vomiting  . Cranberry Other (See Comments)    Cystitis  . Latex Rash  . Monosodium Glutamate Other (See Comments)    Migraine  . Penicillins Rash    Has patient had a PCN reaction causing immediate rash, facial/tongue/throat swelling, SOB or lightheadedness with hypotension: Yes Has patient had a PCN reaction causing severe rash involving mucus membranes or skin necrosis: No Has patient had a PCN reaction that required hospitalization No Has patient had a PCN reaction occurring within the last 10 years: No If all of the above answers are "NO", then may proceed with Cephalosporin use.   Marland Kitchen Antihistamines, Loratadine-Type     Throat swelling  . Montelukast Other (See Comments)    Throat swelling  . Prednisone Swelling    Throat swelling (no difficulty breathing)  . Sulfites Other (See Comments)    Nausea and headaches  . Zofran [Ondansetron Hcl] Other (See Comments)    Muscle cramps, leg tingling  . Azithromycin Other (See Comments) and Diarrhea   Patient Active Problem List   Diagnosis Date Noted  . Posterior vitreous detachment of right eye 09/10/2018  . History of IBS 01/30/2018  . Urolithiasis 06/23/2017  . Fibromyalgia 06/04/2017  . Chronic fatigue 06/04/2017  . Medication intolerance 06/04/2017  . ANA positive 05/20/2017  . 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 Serum calcium elevated 05/20/2017  . Interstitial cystitis 05/20/2017  . Dyspepsia 05/20/2017  . Mixed hyperlipidemia 04/06/2017  . Elevated BP without diagnosis of hypertension 04/06/2017  . Anxiety 04/06/2017  . Dry eyes 04/06/2017   Social History   Socioeconomic History  . Marital status: Married    Spouse name: Not on file  . Number of children: 2  . Years of education: Therapist, sports  . Highest education level: Not on file  Occupational History  . Occupation: Retired  Scientific laboratory technician  . Financial  resource strain: Not on file  . Food insecurity:    Worry: Not on file    Inability: Not on file  . Transportation needs:    Medical: Not on file    Non-medical: Not on file  Tobacco Use  . Smoking status: Never Smoker  . Smokeless tobacco: Never Used  Substance and Sexual Activity  . Alcohol use: No  . Drug use: No  . Sexual activity: Yes  Lifestyle  . Physical activity:    Days per week: Not on file    Minutes per session: Not on file  . Stress: Not on file  Relationships  . Social connections:    Talks on phone: Not on file    Gets together: Not on file    Attends religious service: Not on file    Active member of club or organization: Not on file    Attends meetings of clubs or organizations: Not on file    Relationship status: Not on  file  . Intimate partner violence:    Fear of current or ex partner: Not on file    Emotionally abused: Not on file    Physically abused: Not on file    Forced sexual activity: Not on file  Other Topics Concern  . 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 Gadberry's family history includes Breast cancer in her sister; Diabetes in her paternal grandmother; Hypertension in her mother; Lung cancer in her father.      Objective:    Vitals:   10/16/18 1525  BP: 126/72  Pulse: 80    Physical Exam; well-developed older white female in no acute distress, pleasant, little anxious blood pressure 126/72 pulse 80, BMI 24.6.  HEENT; nontraumatic normocephalic EOMI PERRLA, sclera anicteric oral mucosa moist, she does have a yellow-white coating on her tongue, no obvious plaques in the upper palate or buccal mucosa.  Cardiovascular; regular rate and rhythm with S1-S2 no murmur rub or gallop, Pulmonary; clear bilaterally, Abdomen ;soft, nondistended, bowel sounds are present she has some mild  tenderness in the mid abdomen no guarding or rebound no palpable mass or hepatosplenomegaly, Rectal ;exam not done, Ext; no clubbing cyanosis or edema skin warm dry, Neuro psych; alert and oriented, grossly nonfocal mood and affect appropriate       Assessment & Plan:   #68 67 year old white female with history of IBS, constipation with intermittent exacerbations who comes in with increase in symptoms over the past several weeks.  She had an acute episode of constipation which has resolved. She has had an increase in anxiety over the past several weeks which is probably driving her IBS symptoms.  #2 history of colon polyps-due for follow-up colonoscopy 2021 #3.  Fibromyalgia #4.  Chronic fatigue #5.  Interstitial cystitis #6.  History of multiple medication and food sensitivities   #7 nephrolithiasis #8  Oral thrush  Plan; She will continue to avoid known food triggers but otherwise branch out a bit on her restricted diet Continue probiotic currently on Triphala-suggested she may want to alternate probiotics and give herself periodic breaks off of probiotics  Continue MiraLAX 17 g in 8 ounces of water as needed She is reluctant to take any medication for GERD, she can use Gaviscon or Gelusil as needed. We will give her a 2-week course of Mycostatin oral suspension 5 cc 4 times daily swish and swallow x2 weeks We will start her on Lexapro 10 mg p.o. daily, which she has taken with good results in the past.  I have asked her to make a follow-up appointment with her PCP Dr. Briscoe Deutscher to discuss duration of treatment and dosage adjustments of Lexapro.  She will follow-up with Dr. Loletha Carrow or myself as needed.   Amy S Esterwood PA-C 10/16/2018   Cc: Briscoe Deutscher, DO

## 2018-10-16 NOTE — Patient Instructions (Addendum)
We sent prescriptions to your pharmacy, [facility], [address], [place].  1. Lexapro 10 mg 2. Mycostatin oral suspension.  Continue Probiotics periotic breaks for 2-4 weeks.  Miralax 17 grams inn 8 oz of water as needed.   Use Gaviscon as needed. Normal BMI (Body Mass Index- based on height and weight) is between 23 and 30. Your BMI today is Body mass index is 24.62 kg/m. Marland Kitchen Please consider follow up  regarding your BMI with your Primary Care Provider.

## 2018-10-18 NOTE — Progress Notes (Signed)
Thank you for sending this case to me. I have reviewed the entire note, and the outlined plan seems appropriate.  Thanks for taking the initiative on her anxiety treatment.  Yes, follow up with primary care on that is most appropriate.  Wilfrid Lund, MD

## 2018-10-29 ENCOUNTER — Other Ambulatory Visit: Payer: Self-pay | Admitting: Family Medicine

## 2018-10-29 DIAGNOSIS — Z1231 Encounter for screening mammogram for malignant neoplasm of breast: Secondary | ICD-10-CM

## 2018-11-06 DIAGNOSIS — M9902 Segmental and somatic dysfunction of thoracic region: Secondary | ICD-10-CM | POA: Diagnosis not present

## 2018-11-06 DIAGNOSIS — M791 Myalgia, unspecified site: Secondary | ICD-10-CM | POA: Diagnosis not present

## 2018-11-06 DIAGNOSIS — M9901 Segmental and somatic dysfunction of cervical region: Secondary | ICD-10-CM | POA: Diagnosis not present

## 2018-11-13 ENCOUNTER — Encounter: Payer: Self-pay | Admitting: Family Medicine

## 2018-11-13 ENCOUNTER — Ambulatory Visit (INDEPENDENT_AMBULATORY_CARE_PROVIDER_SITE_OTHER): Payer: PPO | Admitting: Family Medicine

## 2018-11-13 VITALS — BP 124/70 | HR 97 | Temp 98.3°F | Ht 62.0 in | Wt 131.4 lb

## 2018-11-13 DIAGNOSIS — R195 Other fecal abnormalities: Secondary | ICD-10-CM | POA: Diagnosis not present

## 2018-11-13 DIAGNOSIS — F450 Somatization disorder: Secondary | ICD-10-CM | POA: Diagnosis not present

## 2018-11-13 DIAGNOSIS — Z8719 Personal history of other diseases of the digestive system: Secondary | ICD-10-CM

## 2018-11-13 DIAGNOSIS — R1013 Epigastric pain: Secondary | ICD-10-CM

## 2018-11-13 LAB — CBC WITH DIFFERENTIAL/PLATELET
Basophils Absolute: 0.1 10*3/uL (ref 0.0–0.1)
Basophils Relative: 0.6 % (ref 0.0–3.0)
Eosinophils Absolute: 0 10*3/uL (ref 0.0–0.7)
Eosinophils Relative: 0.1 % (ref 0.0–5.0)
HCT: 39.9 % (ref 36.0–46.0)
Hemoglobin: 13.7 g/dL (ref 12.0–15.0)
Lymphocytes Relative: 14.7 % (ref 12.0–46.0)
Lymphs Abs: 1.3 10*3/uL (ref 0.7–4.0)
MCHC: 34.4 g/dL (ref 30.0–36.0)
MCV: 91.7 fl (ref 78.0–100.0)
Monocytes Absolute: 0.4 10*3/uL (ref 0.1–1.0)
Monocytes Relative: 4.7 % (ref 3.0–12.0)
Neutro Abs: 7.3 10*3/uL (ref 1.4–7.7)
Neutrophils Relative %: 79.9 % — ABNORMAL HIGH (ref 43.0–77.0)
Platelets: 376 10*3/uL (ref 150.0–400.0)
RBC: 4.35 Mil/uL (ref 3.87–5.11)
RDW: 12.4 % (ref 11.5–15.5)
WBC: 9.1 10*3/uL (ref 4.0–10.5)

## 2018-11-13 LAB — COMPREHENSIVE METABOLIC PANEL
ALT: 15 U/L (ref 0–35)
AST: 14 U/L (ref 0–37)
Albumin: 4.5 g/dL (ref 3.5–5.2)
Alkaline Phosphatase: 119 U/L — ABNORMAL HIGH (ref 39–117)
BUN: 21 mg/dL (ref 6–23)
CO2: 27 mEq/L (ref 19–32)
Calcium: 10 mg/dL (ref 8.4–10.5)
Chloride: 103 mEq/L (ref 96–112)
Creatinine, Ser: 0.73 mg/dL (ref 0.40–1.20)
GFR: 84.52 mL/min (ref >60.00)
Glucose, Bld: 106 mg/dL — ABNORMAL HIGH (ref 70–99)
Potassium: 4 mEq/L (ref 3.5–5.1)
Sodium: 140 mEq/L (ref 135–145)
Total Bilirubin: 0.4 mg/dL (ref 0.2–1.2)
Total Protein: 7.6 g/dL (ref 6.0–8.3)

## 2018-11-13 LAB — VITAMIN B12: Vitamin B-12: 370 pg/mL (ref 211–911)

## 2018-11-13 LAB — H. PYLORI ANTIBODY, IGG: H Pylori IgG: NEGATIVE

## 2018-11-13 LAB — VITAMIN D 25 HYDROXY (VIT D DEFICIENCY, FRACTURES): VITD: 42.73 ng/mL (ref 30.00–100.00)

## 2018-11-13 MED ORDER — FLUCONAZOLE 100 MG PO TABS: ORAL_TABLET | ORAL | 0 refills | Status: DC

## 2018-11-13 NOTE — Progress Notes (Signed)
Natalie Erickson is a 67 y.o. female here for an acute visit.  History of Present Illness:   [name], CMA acting as scribe for Dr. Briscoe Deutscher.    HPI:   Patient states that she was seen by GI and does not feel like she has had any improvement from them. She was diagnosed with thrush and has been taking the medication. She has had [place] but no vomiting. CMA recommended that she be seen by GI but she was not happy with the recommendations. She has changed her diet and has no improvement. She would like to see if their are some stool test that can be done to see what the issue is.   Plan from GI note: Continue MiraLAX 17 g in 8 ounces of water as needed She is reluctant to take any medication for GERD, she can use Gaviscon or Gelusil as needed. We will give her a 2-week course of Mycostatin oral suspension 5 cc 4 times daily swish and swallow x2 weeks. We will start her on Lexapro 10 mg p.o. daily, which she has taken with good results in the past. I have asked her to make a follow-up appointment with her PCP Dr. Briscoe Deutscher to discuss duration of treatment and dosage adjustments of Lexapro.  PMHx, SurgHx, SocialHx, Medications, and Allergies were reviewed in the Visit Navigator and updated as appropriate.  Current Medications:   .  acetaminophen (TYLENOL) 500 MG tablet, Take 1,000 mg by mouth every 6 (six) hours as needed for headache (pain)., Disp: , Rfl:  .  AMBULATORY NON FORMULARY MEDICATION, Magnesium oil Apply to legs as needed, Disp: , Rfl:  .  AMBULATORY NON FORMULARY MEDICATION, Medication Name: triphala 1000 mg a day, Disp: , Rfl:  .  AMBULATORY NON FORMULARY MEDICATION, Medication Name: probiotic ultimate flora, Disp: , Rfl:  .  Calcium & Magnesium Carbonates (MYLANTA PO), Take 20 mLs by mouth as directed. , Disp: , Rfl:  .  Cholecalciferol (VITAMIN D) 2000 units tablet, Take 2,000 Units by mouth daily. , Disp: , Rfl:  .  escitalopram (LEXAPRO) 10 MG tablet, Take 1 tablet  by mouth daily., Disp: 30 tablet, Rfl: 2 .  MAGNESIUM GLUCONATE PO, Take by mouth as directed. , Disp: , Rfl:  .  NON FORMULARY, , Disp: , Rfl:  .  nystatin (MYCOSTATIN) 100000 UNIT/ML suspension, Take 5 cc's by mouth, swish and swallow, 4 times daily for 14 days., Disp: 280 mL, Rfl: 1 .  Polyethyl Glycol-Propyl Glycol (SYSTANE ULTRA OP), Apply to eye 4 (four) times daily., Disp: , Rfl:  .  propranolol (INDERAL) 10 MG tablet, Take 1 tablet (10 mg total) by mouth daily as needed (PALPITATIONS). (Patient not taking: Reported on 10/16/2018), Disp: 30 tablet, Rfl: 3 .  Simethicone (GAS-X PO), Take by mouth as directed. , Disp: , Rfl:    Allergies  Allergen Reactions  . Statins Tinitus    Achy joints, muscle aches  . Banana Other (See Comments)    migraine  . Chocolate Other (See Comments)    migraine  . Codeine Nausea And Vomiting  . Cranberry Other (See Comments)    Cystitis  . Latex Rash  . Monosodium Glutamate Other (See Comments)    Migraine  . Penicillins Rash    Has patient had a PCN reaction causing immediate rash, facial/tongue/throat swelling, SOB or lightheadedness with hypotension: Yes Has patient had a PCN reaction causing severe rash involving mucus membranes or skin necrosis: No Has patient had a  PCN reaction that required hospitalization No Has patient had a PCN reaction occurring within the last 10 years: No If all of the above answers are "NO", then may proceed with Cephalosporin use.   Marland Kitchen Antihistamines, Loratadine-Type     Throat swelling  . Montelukast Other (See Comments)    Throat swelling  . Prednisone Swelling    Throat swelling (no difficulty breathing)  . Sulfites Other (See Comments)    Nausea and headaches  . Zofran [Ondansetron Hcl] Other (See Comments)    Muscle cramps, leg tingling  . Azithromycin Other (See Comments) and Diarrhea   Review of Systems:   Pertinent items are noted in the HPI. Otherwise, ROS is negative.  Vitals:   Vitals:    11/13/18 1048  BP: 124/70  Pulse: 97  Temp: 98.3 F (36.8 C)  TempSrc: Oral  SpO2: 99%  Weight: 131 lb 6.4 oz (59.6 kg)  Height: 5\' 2"  (1.575 m)     Body mass index is 24.03 kg/m.  Physical Exam:   Physical Exam  Constitutional: She appears well-nourished.  HENT:  Head: Normocephalic and atraumatic.  Eyes: Pupils are equal, round, and reactive to light. EOM are normal.  Neck: Normal range of motion. Neck supple.  Cardiovascular: Normal rate, regular rhythm, normal heart sounds and intact distal pulses.  Pulmonary/Chest: Effort normal.  Abdominal: Soft. There is tenderness in the epigastric area. There is no rigidity.  Skin: Skin is warm.  Psychiatric: She has a normal mood and affect. Her behavior is normal.  Nursing note and vitals reviewed.   Assessment and Plan:   1. Change in stool   2. History of IBS   3. Dyspepsia   4. Somatization disorder    Patient presents today for evaluation of abdominal pain, change in stools, and "candida." Hx of IBS, fibromyalgia, medication intolerance, and somatization. Was evaluated at GI on 10/22. Possible thrush and Rx Mycostatin, which the patient has been taking. Concern for IBS flare, discussed diet and probiotics. Concern for reflux, but patient will not take medications. Not feeling improved. Worried about a systemic candidal infection as patient prefers to subscribe to an Integrative Medicine approach.   Patient states that she had been feeling well until a few months ago. Husband reports diet indiscretion while they were on a cruise. Since then, she has had intermittent bloating and constipation, along with epigastric pain that sometimes radiates to the back. She is intolerant to many medication treatments. Diet has been limited by her and she was going to [place]. Felt improved with supplements and exercise.   Upon exam today, the patient has tenderness at the epigastrium. No overt bloating. No thrush. I let her  know that probiotics can be helpful, but chronic systemic candida (in the abscess of sepsis) is not a recognized diagnosis. I let her know that I am worried about gastritis/PUD. Unfortunately, she refuses treatment.   Will check labs today since her diet is so restricted. Recommended [facility] if she would like to continue on that path.  Patient did not start Lexapro.  Orders Placed This Encounter  Procedures  . Ova and parasite examination  . Stool Culture  . CBC with Differential/Platelet  . Comprehensive metabolic panel  . Vitamin B12  . VITAMIN D 25 Hydroxy (Vit-D Deficiency, Fractures)  . H. pylori antibody, IgG  . C. Difficile Toxin   Meds ordered this encounter  Medications  . fluconazole (DIFLUCAN) 100 MG tablet    Sig: Take 1 tablet,  may repeat again in 1 week    Dispense:  2 tablet    Refill:  0   . Reviewed expectations re: course of current medical issues. . Discussed self-management of symptoms. . Outlined signs and symptoms indicating need for more acute intervention. . Patient verbalized understanding and all questions were answered. Marland Kitchen Health Maintenance issues including appropriate healthy diet, exercise, and smoking avoidance were discussed with patient. . See orders for this visit as documented in the electronic medical record. . Patient received an After Visit Summary.  CMA served as Education administrator during this visit. History, Physical, and Plan performed by medical provider. The above documentation has been reviewed and is accurate and complete. Briscoe Deutscher, D.O.  Briscoe Deutscher, DO [place], [place] [facility] 11/14/2018

## 2018-11-14 ENCOUNTER — Telehealth: Payer: Self-pay | Admitting: Family Medicine

## 2018-11-14 ENCOUNTER — Encounter: Payer: Self-pay | Admitting: Family Medicine

## 2018-11-14 ENCOUNTER — Other Ambulatory Visit: Payer: PPO

## 2018-11-14 ENCOUNTER — Ambulatory Visit: Payer: PPO | Admitting: Family Medicine

## 2018-11-14 DIAGNOSIS — R195 Other fecal abnormalities: Secondary | ICD-10-CM | POA: Insufficient documentation

## 2018-11-14 DIAGNOSIS — F450 Somatization disorder: Secondary | ICD-10-CM | POA: Insufficient documentation

## 2018-11-14 LAB — C.DIFFICILE TOXIN: C. Difficile Toxin A: NOT DETECTED

## 2018-11-14 NOTE — Telephone Encounter (Signed)
Please see message and advise 

## 2018-11-14 NOTE — Telephone Encounter (Signed)
Spoke to pt told her the vitamin Dr. Juleen China recommended is by One Elevated Methyl Folate + and you can purchase this on [place]. Pt verbalized understanding.

## 2018-11-14 NOTE — Telephone Encounter (Signed)
Patient came in inquiring about multivitamins that was discussed at her last visit and requests a call back to clarify what kind she was needing. Contact patient to advise, okay to leave a detailed message.

## 2018-11-14 NOTE — Telephone Encounter (Signed)
One Elevated Methylfolate +

## 2018-11-19 ENCOUNTER — Ambulatory Visit (INDEPENDENT_AMBULATORY_CARE_PROVIDER_SITE_OTHER): Payer: PPO | Admitting: Family Medicine

## 2018-11-19 ENCOUNTER — Ambulatory Visit (INDEPENDENT_AMBULATORY_CARE_PROVIDER_SITE_OTHER): Payer: PPO

## 2018-11-19 ENCOUNTER — Encounter: Payer: Self-pay | Admitting: Family Medicine

## 2018-11-19 VITALS — BP 122/74 | HR 89 | Temp 98.6°F | Ht 62.0 in | Wt 130.2 lb

## 2018-11-19 DIAGNOSIS — K5900 Constipation, unspecified: Secondary | ICD-10-CM | POA: Diagnosis not present

## 2018-11-19 DIAGNOSIS — R1084 Generalized abdominal pain: Secondary | ICD-10-CM

## 2018-11-19 DIAGNOSIS — F419 Anxiety disorder, unspecified: Secondary | ICD-10-CM

## 2018-11-19 NOTE — Progress Notes (Signed)
   Subjective:  Natalie Erickson is a 67 y.o. female who presents today with a chief complaint of abdominal Erickson.   HPI:  Abdominal Erickson/Constipation Patient last seen by her PCP 6 days ago for abdominal Erickson and constipation.  Symptoms have worsened a little bit since then.  Erickson primarily located in upper abdominal area.  She occasionally takes MiraLAX help with her constipation.  She has been on this for the last couple of days and had one soft bowel movement this morning.  She has quite a bit of anxiety surrounding her abdominal Erickson and constipation.  Her last GI appointment, she was recommended to start Lexapro, however she has not yet done this.  She has had some nausea without vomiting.  No hematochezia or melena.  No fevers or chills.  No other obvious alleviating or aggravating factors.  ROS: Per HPI  PMH: She reports that she has never smoked. She has never used smokeless tobacco. She reports that she does not drink alcohol or use drugs.  Objective:  Physical Exam: BP 122/74 (BP Location: Left Arm, Patient Position: Sitting, Cuff Size: Normal)   Pulse 89   Temp 98.6 F (37 C) (Oral)   Ht 5\' 2"  (1.575 m)   Wt 130 lb 3.2 oz (59.1 kg)   SpO2 96%   BMI 23.81 kg/m   Wt Readings from Last 3 Encounters:  11/19/18 130 lb 3.2 oz (59.1 kg)  11/13/18 131 lb 6.4 oz (59.6 kg)  10/16/18 134 lb 9.6 oz (61.1 kg)  Gen: NAD, resting comfortably CV: RRR with no murmurs appreciated Pulm: NWOB, CTAB with no crackles, wheezes, or rhonchi GI: Normal bowel sounds present.  Tender to palpation in all quadrants, more prominently in epigastric area.  No rebound or guarding.  No tenderness at McBurney's point.  Murphy sign negative.  Assessment/Plan:  Abdominal Erickson / Constipation These are both chronic problems. Her KUB shows a moderate stool burden based on my read - will await radiology read.  Her abdominal Erickson is likely multifactorial however I think a significant component of this is due to her  constipation.  She does not have any other red flag signs or symptoms today.  Recommended continue using MiraLAX 1-2 capfuls daily for the next 1 to 2 weeks with goal of 2-3 soft bowel movements daily.  Also recommended diet high in fiber and good oral hydration.  Discussed reasons to return to care or seek emergent care.  Anxiety Chronic problem. We also discussed role of anxiety in her above symptoms and how anxiety could worsen her abdominal Erickson and constipation. She has previously been prescribed Lexapro by her GI doctor however has not yet started this.  She has tried therapy in the past which does not work for her as well.  Discussed possible treatment options with patient and advised her to follow-up with her PCP to discuss further.    [name]. Natalie Pain, MD 11/19/2018 2:40 PM

## 2018-11-20 ENCOUNTER — Telehealth: Payer: Self-pay | Admitting: Family Medicine

## 2018-11-20 NOTE — Telephone Encounter (Addendum)
X-ray results received via MyChart and documented in result notes.

## 2018-11-20 NOTE — Progress Notes (Signed)
Dr Marigene Ehlers interpretation of your xray report:  The radiologist reviewed your xray and did not see any additional findings aside from the constipation we discussed. Please continue with the miralax as we discussed and let me or Dr Juleen China know if your symptoms do not improve.   If you have any additional questions, please give [place] a call or send [place] a message through [place].  Take care, Dr [name]

## 2018-11-20 NOTE — Telephone Encounter (Signed)
Patient called and she says someone called with results. I advised it is the results for the abdomen x-ray, she says "I saw them on MyChart. What about my other tests, the stool tests?" I advised they are still in process and someone will call with the results when the doctor releases them, she verbalized understanding.

## 2018-11-20 NOTE — Telephone Encounter (Signed)
Patient called back again wanting results  , please refer to previous message , please call the patient at   [phone]

## 2018-11-20 NOTE — Telephone Encounter (Signed)
Patient returning a call for lab results. Nurse triage currently unavailable. Please advise. Would like a phone call back today.   Copied from [place] [phone]. Topic: Quick Communication - Lab Results (Clinic Use ONLY) >> Nov 20, 2018  2:34 PM Agner, Tyler Pita, CMA wrote: Called patient to inform them of lab results. When patient returns call, triage nurse may disclose results.

## 2018-11-21 LAB — STOOL CULTURE
MICRO NUMBER:: 91399260
MICRO NUMBER:: 91399261
MICRO NUMBER:: 91399263
SHIGA RESULT:: NOT DETECTED
SPECIMEN QUALITY:: ADEQUATE
SPECIMEN QUALITY:: ADEQUATE
SPECIMEN QUALITY:: ADEQUATE

## 2018-11-21 LAB — OVA AND PARASITE EXAMINATION
CONCENTRATE RESULT:: NONE SEEN
MICRO NUMBER:: 91399262
SPECIMEN QUALITY:: ADEQUATE
TRICHROME RESULT:: NONE SEEN

## 2018-11-29 DIAGNOSIS — Z713 Dietary counseling and surveillance: Secondary | ICD-10-CM | POA: Diagnosis not present

## 2018-12-06 DIAGNOSIS — Z713 Dietary counseling and surveillance: Secondary | ICD-10-CM | POA: Diagnosis not present

## 2018-12-07 ENCOUNTER — Ambulatory Visit
Admission: RE | Admit: 2018-12-07 | Discharge: 2018-12-07 | Disposition: A | Discharge status: Home / Self Care | Payer: PPO | Source: Ambulatory Visit | Attending: Family Medicine | Admitting: Family Medicine

## 2018-12-07 DIAGNOSIS — Z1231 Encounter for screening mammogram for malignant neoplasm of breast: Secondary | ICD-10-CM | POA: Diagnosis not present

## 2018-12-13 DIAGNOSIS — Z713 Dietary counseling and surveillance: Secondary | ICD-10-CM | POA: Diagnosis not present

## 2018-12-24 DIAGNOSIS — Z713 Dietary counseling and surveillance: Secondary | ICD-10-CM | POA: Diagnosis not present

## 2019-01-10 ENCOUNTER — Ambulatory Visit (INDEPENDENT_AMBULATORY_CARE_PROVIDER_SITE_OTHER): Payer: PPO | Admitting: Family Medicine

## 2019-01-10 ENCOUNTER — Encounter: Payer: Self-pay | Admitting: Family Medicine

## 2019-01-10 DIAGNOSIS — K5904 Chronic idiopathic constipation: Secondary | ICD-10-CM | POA: Diagnosis not present

## 2019-01-10 DIAGNOSIS — Z713 Dietary counseling and surveillance: Secondary | ICD-10-CM

## 2019-01-10 NOTE — Progress Notes (Signed)
Medical Nutrition Therapy  Assessment:  Primary concerns today: GI symptoms including constipation, pain, and nausea.  Natalie Erickson feels she has had GI pblms since childhood, which got especially bad following some antibiotics several years.  She got better after ~8 mo, but started having problems about 2 yrs ago following a bad sinus infection for which she took another antibiotic (E-mycin, she thinks) and prednisone.  Since then, she has had problems with constipation.  She saw an ayurvedic doctor, who prescribed herbal remedies; abd pain and constipation resolved, then recurred about August 2019.  In July, she was treated for a mild yeast infection, and in August she was diagnosed with thrush for which she was prescribed oral nystatin, which helped.  At that time she started a quasi- anti-candida diet, and Dr. Juleen China prescribed diflucan, which she has not taken.  She is very reluctant to take medications.     [name] has seen RD Freada Bergeron 4 times, but said it has not been helpful.  [facility] suggestions have included granola bars & Ensures, both of which caused signif abd pain and nausea.) .    Learning Readiness: Ready  Usual eating pattern: 3 meals and 1 snack per day. Frequent foods and beverages: water, almond milk, herb teas, eggs, oatmeal, salmon, sweet potato.   Avoided foods: dairy (lactose-intol), cheese, many condiments with additives, such as MSG, bananas, dried fruits and wine (sulfites), sugar, coffee, [place], pork, most white potatoes, broccoli, Br sprouts.    Has been following a modification of both candida and FODMAPS diets.  I made it clear to Tanae that FODMAPS is not a diet plan, but a diagnostic tool.    Usual physical activity: when feeling ok: 60 min yoga 1 X wk, 60 min tai chi 1-3 X wk, 30 min chi gong daily. Sleep: Estimates she gets an average of 7-8 hrs/night half the time, and about 3-4 hrs/night 3-4 X wk.  Wakes up with nausea and abd pain when has taken a laxative,  roughly every three days.  Has recently determined that Miralax makes her feel bad (dizziness, abd pain, nausea, leg (calf) cramps, and headache), so has decided today to stop using it.  Had taken 1/2-dose Miralax Tuesday, and 1/2-dose yesterday 2 X.  Woke up last night again very uncomfortable.    Signif med hx: Reflux, hiatal hernia, hyperlipidemia, anxiety, and somatization D/O.  Also: fibromyalgia dx several years ago, but no recent problems with joint pain.  H/o kidn stones believed to be d/t chronic high serum Ca levels, although records of Ca going back to June 2018 show normal range.  I believe anxiety may be contributing to constipation and GI complaints.  Suvi confided that she is worried that her constipation could result in a bowel obstruction.  I assured her that this is an uncommon occurrence, and we had a discussion of the tremendous influence anxiety can have on GI function.  [name] will be likely benefit, including in terms of GI function, by more aggressively addressing her anxiety.  She seemed receptive to this, and said she will re-try her Lexapro, which she had discontinued after trying one half-dose.    24-hr recall: (Up at 6:30 AM) B (7 AM)-   1/2 c dry oatmeal, cooked; 1 tsp chia seeds, 2 oz almond milk, cinnamon Snk (9 AM)-   1 fried egg in ghee, 1 slc sourdough bread, 1/2 tsp ghee, water Snk (10 AM)-  4 oz almond milk, 1 slc sourdough bread, 1/2 tsp ghee, 1 kiwi,  water - 1 hour yoga -  L (12:30 PM)-  3 chx legs, a little skin, 1 sweet potato, water - 1 hour tai chi -  Snk (3:30)-  4 oz almond milk, 3 GF cookies (w/ coconut sugar; 310 kcal) D (6:15 PM)-  2 c homemade chx-veg soup (no carb), 1 slc sourdough bread, 1/2 tsp ghee, water Snk ( PM)-  --- Typical day? Yes.      Nutritional Diagnosis:  [place]-1.4 Altered GI function As related to possible combination of food triggers and anxiety.  As evidenced by chronic constipation, frequently accompanied by abdominal pain and  nausea.  Handouts given during visit include:  After-Visit Summary (AVS)  FODMAP diet handout  Demonstrated degree of understanding via:  Teach Back  Barriers to learning/adherence to lifestyle change: Anxiety and somatization.  Monitoring/Evaluation:  Dietary intake, exercise, and body weight in 6 week(s).

## 2019-01-10 NOTE — Patient Instructions (Addendum)
-   In addition to exploring dietary changes that may help your GI function, it makes good sense to address your anxiety.    - Remember, there is a strong neural connection between the gut and the brain.  Keep in mind also that poor sleep can have a big impact on GI function.    - Reconsider giving Lexapro a try.  If this absolutely does not work for you, discuss with Dr. Juleen China about trying another medication.    - Follow a FODMAP elimination diet as directed on the handout provided today.  More information can be found by searching for [place].  Email [email or web address] if you have Qs.   - Keep a FOOD & SYMPTOMS log.  Include what you eat, how much, and what time.  In addition, write down symptoms and time of onset.  Look for trends!  - The idea is to ADD back foods, ONE AT A TIME, with at least 3-4 days between any added foods.   - You will need to work with this process for at least the next 6 weeks.  Do not add back any eliminated food until after 2 weeks at least.    Constipation:   1. For the next week, try 1/2 tbsp of flax seed meal per day, making sure that you get adequate fluids also.  Increase to a max of 1 1/2 tbsp slowly and progressively IF no problems at lower doses.    2. Aim for 64 ounces of fluids per day.  Do NOT worry about restricting fluids with meals.    - For now, do not take another remedy for constipation while you are trying this, so you know what is making a difference for you.    - For now, avoid taking digestive enzymes.

## 2019-01-15 ENCOUNTER — Ambulatory Visit (INDEPENDENT_AMBULATORY_CARE_PROVIDER_SITE_OTHER): Payer: PPO | Admitting: Family Medicine

## 2019-01-15 VITALS — BP 132/64 | HR 93 | Temp 98.6°F | Ht 62.0 in | Wt 130.0 lb

## 2019-01-15 DIAGNOSIS — Z8719 Personal history of other diseases of the digestive system: Secondary | ICD-10-CM

## 2019-01-15 DIAGNOSIS — F450 Somatization disorder: Secondary | ICD-10-CM | POA: Diagnosis not present

## 2019-01-15 DIAGNOSIS — Z789 Other specified health status: Secondary | ICD-10-CM | POA: Diagnosis not present

## 2019-01-15 DIAGNOSIS — F419 Anxiety disorder, unspecified: Secondary | ICD-10-CM

## 2019-01-15 DIAGNOSIS — K5904 Chronic idiopathic constipation: Secondary | ICD-10-CM

## 2019-01-15 NOTE — Patient Instructions (Addendum)
GETTING TO GOOD BOWEL HEALTH  Irregular bowel habits such as constipation can lead to many problems over time.  Having one soft bowel movement a day is the most important way to prevent further problems.    The goal: ONE SOFT BOWEL MOVEMENT A DAY!  To have soft, regular bowel movements:  . Drink at least 8 tall glasses of water a day.   . Take plenty of fiber.  Fiber is the undigested part of plant food that passes into the colon, acting s "natures broom" to encourage bowel motility and movement.  Fiber can absorb and hold large amounts of water. This results in a larger, bulkier stool, which is soft and easier to pass. Work gradually over several weeks up to 6 servings a day of fiber (25g a day even more if needed) in the form of: o Vegetables -- Root (potatoes, carrots, turnips), leafy green (lettuce, salad greens, celery, spinach), or cooked high residue (cabbage, broccoli, etc) o Fruit -- Fresh (unpeeled skin & pulp), Dried (prunes, apricots, cherries, etc ),  or stewed ( applesauce)  o Whole grain breads, pasta, etc (whole wheat)  o Bran cereals  . Bulking Agents -- This type of water-retaining fiber generally is easily obtained each day by one of the following:  o Psyllium bran -- The psyllium plant is remarkable because its ground seeds can retain so much water. This product is available as Metamucil, Konsyl, Effersyllium, Per Diem Fiber, or the less expensive generic preparation in drug and health food stores. Although labeled a laxative, it really is not a laxative.  o Methylcellulose -- This is another fiber derived from wood which also retains water. It is available as Citrucel. o Polyethylene Glycol - and "artificial" fiber commonly called Miralax or Glycolax.  It is helpful for people with gassy or bloated feelings with regular fiber o Flax Seed - a less gassy fiber than psyllium . No reading or other relaxing activity while on the toilet. If bowel movements take longer than 5 minutes,  you are too constipated. . AVOID CONSTIPATION.  High fiber and water intake usually takes care of this.  Sometimes a laxative is needed to stimulate more frequent bowel movements, but  . Laxatives are not a good long-term solution as it can wear the colon out. o Osmotics (Milk of Magnesia, Fleets phosphosoda, Magnesium citrate, MiraLax, GoLytely) are safer than  o Stimulants (Senokot, Castor Oil, Dulcolax, Ex Lax)    o Do not take laxatives for more than 7days in a row. .  IF SEVERELY CONSTIPATED, try a Bowel Retraining Program: o Do not use laxatives.  o Eat a diet high in roughage, such as bran cereals and leafy vegetables.  o Drink six (6) ounces of prune or apricot juice each morning.  o Eat two (2) large servings of stewed fruit each day.  o Take one (1) heaping tablespoon of a psyllium-based bulking agent twice a day. Use sugar-free sweetener when possible to avoid excessive calories.  o Eat a normal breakfast.  o Set aside 15 minutes after breakfast to sit on the toilet, but do not strain to have a bowel movement.  o If you do not have a bowel movement by the third day, use an enema and repeat the above steps.   

## 2019-01-15 NOTE — Progress Notes (Signed)
Natalie Erickson is a 68 y.o. female here for an acute visit.  History of Present Illness:   .Natalie Erickson, CMA acting as scribe for Dr. Briscoe Deutscher.   HPI: Patient in office for follow up. She was started on the Lexapro but was not able to take it. States that it has been giving her some additional GI symptoms. She would like to try something new for anxiety or wait until she sees a new Comptroller.  Constipation: At recommendation of dietitian she has been holding her Miralax and probiotics. She would like to talk about other options to help with constipation. Currently taking flax and drinking more water. Doing yoga.  PMHx, SurgHx, SocialHx, Medications, and Allergies were reviewed in the Visit Navigator and updated as appropriate.  Current Medications:   .  acetaminophen (TYLENOL) 500 MG tablet, Take 1,000 mg by mouth every 6 (six) hours as needed for headache (pain)., Disp: , Rfl:  .  AMBULATORY NON FORMULARY MEDICATION, Magnesium oil Apply to legs as needed, Disp: , Rfl:  .  AMBULATORY NON FORMULARY MEDICATION, Medication Name: probiotic ultimate flora, Disp: , Rfl:  .  b complex vitamins capsule, Take 1 capsule by mouth daily., Disp: , Rfl:  .  Calcium & Magnesium Carbonates (MYLANTA PO), Take 20 mLs by mouth as directed. , Disp: , Rfl:  .  Cholecalciferol (VITAMIN D) 2000 units tablet, Take 2,000 Units by mouth daily. , Disp: , Rfl:  .  MAGNESIUM GLUCONATE PO, Take 200 mg by mouth as directed. , Disp: , Rfl:  .  NON FORMULARY, , Disp: , Rfl:  .  Polyethyl Glycol-Propyl Glycol (SYSTANE ULTRA OP), Apply to eye 4 (four) times daily., Disp: , Rfl:  .  Simethicone (GAS-X PO), Take by mouth as directed. , Disp: , Rfl:    Allergies  Allergen Reactions  . Statins Tinitus    Achy joints, muscle aches  . Banana Other (See Comments)    migraine  . Chocolate Other (See Comments)    migraine  . Codeine Nausea And Vomiting  . Cranberry Other (See Comments)    Cystitis  .  Latex Rash  . Monosodium Glutamate Other (See Comments)    Migraine  . Penicillins Rash    Has patient had a PCN reaction causing immediate rash, facial/tongue/throat swelling, SOB or lightheadedness with hypotension: Yes Has patient had a PCN reaction causing severe rash involving mucus membranes or skin necrosis: No Has patient had a PCN reaction that required hospitalization No Has patient had a PCN reaction occurring within the last 10 years: No If all of the above answers are "NO", then may proceed with Cephalosporin use.   Marland Kitchen Antihistamines, Loratadine-Type     Throat swelling  . Lexapro [Escitalopram]     GI   . Montelukast Other (See Comments)    Throat swelling  . Prednisone Swelling    Throat swelling (no difficulty breathing)  . Sulfites Other (See Comments)    Nausea and headaches  . Zofran [Ondansetron Hcl] Other (See Comments)    Muscle cramps, leg tingling  . Azithromycin Other (See Comments) and Diarrhea   Review of Systems:   Pertinent items are noted in the HPI. Otherwise, ROS is negative.  Vitals:   Vitals:   01/15/19 1121  BP: 132/64  Pulse: 93  Temp: 98.6 F (37 C)  TempSrc: Oral  SpO2: 98%  Weight: 130 lb (59 kg)  Height: 5\' 2"  (1.575 m)     Body mass index is  23.78 kg/m.  Physical Exam:   Physical Exam Vitals signs and nursing note reviewed.  HENT:     Head: Normocephalic and atraumatic.  Eyes:     Pupils: Pupils are equal, round, and reactive to light.  Neck:     Musculoskeletal: Normal range of motion and neck supple.  Cardiovascular:     Rate and Rhythm: Normal rate and regular rhythm.     Heart sounds: Normal heart sounds.  Pulmonary:     Effort: Pulmonary effort is normal.  Abdominal:     Palpations: Abdomen is soft.  Skin:    General: Skin is warm.  Psychiatric:        Behavior: Behavior normal.    Assessment and Plan:   [name] was seen today for follow-up.  Diagnoses and all orders for this visit:  Chronic  idiopathic constipation Comments: See AVS for Bowel Health Handout.  Somatization disorder  History of IBS  Medication intolerance  Anxiety Comments: Discussed medication options. She will not start any. Wants to see a Civil engineer, contracting.     . Reviewed expectations re: course of current medical issues. . Discussed self-management of symptoms. . Outlined signs and symptoms indicating need for more acute intervention. . Patient verbalized understanding and all questions were answered. Marland Kitchen Health Maintenance issues including appropriate healthy diet, exercise, and smoking avoidance were discussed with patient. . See orders for this visit as documented in the electronic medical record. . Patient received an After Visit Summary.  CMA served as Education administrator during this visit. History, Physical, and Plan performed by medical provider. The above documentation has been reviewed and is accurate and complete. Briscoe Deutscher, D.O.  Briscoe Deutscher, DO [place], [facility] 01/24/2019

## 2019-01-24 ENCOUNTER — Encounter: Payer: Self-pay | Admitting: Family Medicine

## 2019-02-07 DIAGNOSIS — K581 Irritable bowel syndrome with constipation: Secondary | ICD-10-CM | POA: Diagnosis not present

## 2019-02-07 DIAGNOSIS — Z77010 Contact with and (suspected) exposure to arsenic: Secondary | ICD-10-CM | POA: Diagnosis not present

## 2019-02-07 DIAGNOSIS — K5904 Chronic idiopathic constipation: Secondary | ICD-10-CM | POA: Diagnosis not present

## 2019-02-07 DIAGNOSIS — Z77018 Contact with and (suspected) exposure to other hazardous metals: Secondary | ICD-10-CM | POA: Diagnosis not present

## 2019-02-07 DIAGNOSIS — M797 Fibromyalgia: Secondary | ICD-10-CM | POA: Diagnosis not present

## 2019-02-19 ENCOUNTER — Ambulatory Visit: Payer: PPO | Admitting: Family Medicine

## 2019-03-12 DIAGNOSIS — Z77011 Contact with and (suspected) exposure to lead: Secondary | ICD-10-CM | POA: Diagnosis not present

## 2019-03-12 DIAGNOSIS — Z77010 Contact with and (suspected) exposure to arsenic: Secondary | ICD-10-CM | POA: Diagnosis not present

## 2019-03-12 DIAGNOSIS — Z77018 Contact with and (suspected) exposure to other hazardous metals: Secondary | ICD-10-CM | POA: Diagnosis not present

## 2019-03-12 DIAGNOSIS — K581 Irritable bowel syndrome with constipation: Secondary | ICD-10-CM | POA: Diagnosis not present

## 2019-05-06 DIAGNOSIS — M9902 Segmental and somatic dysfunction of thoracic region: Secondary | ICD-10-CM | POA: Diagnosis not present

## 2019-05-06 DIAGNOSIS — M791 Myalgia, unspecified site: Secondary | ICD-10-CM | POA: Diagnosis not present

## 2019-05-06 DIAGNOSIS — M9901 Segmental and somatic dysfunction of cervical region: Secondary | ICD-10-CM | POA: Diagnosis not present

## 2019-05-13 NOTE — Progress Notes (Signed)
Virtual Visit via Video   Due to the COVID-19 pandemic, this visit was completed with telemedicine (audio/video) technology to reduce patient and provider exposure as well as to preserve personal protective equipment.   I connected with Natalie Erickson by a video enabled telemedicine application and verified that I am speaking with the correct person using two identifiers. Location patient: Home Location provider: Between [facility], Office Persons participating in the virtual visit: Avo Schlachter, Briscoe Deutscher, DO Josepha Pigg, [place] acting as scribe for Dr. Briscoe Deutscher.   I discussed the limitations of evaluation and management by telemedicine and the availability of in person appointments. The patient expressed understanding and agreed to proceed.  Care Team   Patient Care Team: Briscoe Deutscher, DO as PCP - General (Family Medicine) Loletha Carrow Kirke Corin, MD as Consulting Physician (Gastroenterology) Murrell Redden Earlyne Iba, MD as Consulting Physician (Obstetrics and Gynecology) Hortencia Pilar, MD as Consulting Physician (Surgery) Lyndee Hensen, PT as Physical Therapist (Physical Therapy) Kennith Center, RD as Dietitian (Family Medicine)  Subjective:   HPI:   Abdominal Pain / Constipation At last visit patient was given instructions for constipation. She has been seeing an integrative doctor, Viviana Simpler [facility]) in [place]. They did food allergy testing and discovered that she is allergic to eggs, whey, ginger, vanilla, shell fish. She has stopped eating eggs and her sx have been improving gradually. They did a blood test which showed arsenic and heavy metals in her system. She has gained roughly 5 lbs over the past month. BMs are "ok, not normal normal but getting better". She reports occasional constipation. Reports increased hemorrhoids and gas when using Colace. She has also tried [employer].   T-spine pain Injured while working in the yard. She has seen chiropractor and received  OMT. She noticed increased heart palpitations and some increased pain afterwards. She has been using a roller for her back pain. She is interested in working with PT for T-spine pain. She has been doing previously provided HEP from [facility], just being more careful. She has been doing more Qigong exercises.   ROS otherwise negative.  Patient Active Problem List   Diagnosis Date Noted  . 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 Change in stool 11/14/2018  . Posterior vitreous detachment of right eye 09/10/2018  . History of IBS 01/30/2018  . Urolithiasis 06/23/2017  . Fibromyalgia 06/04/2017  . Chronic fatigue 06/04/2017  . Medication intolerance 06/04/2017  . ANA positive 05/20/2017  . 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 Serum calcium elevated 05/20/2017  . Interstitial cystitis 05/20/2017  . Dyspepsia 05/20/2017  . Mixed hyperlipidemia 04/06/2017  . Elevated BP without diagnosis of hypertension 04/06/2017  . Anxiety 04/06/2017  . Dry eyes 04/06/2017    Social History   Tobacco Use  . Smoking status: Never Smoker  . Smokeless tobacco: Never Used  Substance Use Topics  . Alcohol use: No   Current Outpatient Medications:  .  acetaminophen (TYLENOL) 500 MG tablet, Take 1,000 mg by mouth every 6 (six) hours as needed for headache (pain)., Disp: , Rfl:  .  AMBULATORY NON FORMULARY MEDICATION, Magnesium oil Apply to legs as needed, Disp: , Rfl:  .  AMBULATORY NON FORMULARY MEDICATION, Medication Name: probiotic ultimate flora, Disp: , Rfl:  .  b complex vitamins capsule, Take 1 capsule by mouth daily., Disp: , Rfl:  .  Calcium & Magnesium Carbonates (MYLANTA PO), Take 20 mLs by mouth as directed. , Disp: , Rfl:  .  Cholecalciferol (VITAMIN D) 2000 units  tablet, Take 2,000 Units by mouth daily. , Disp: , Rfl:  .  MAGNESIUM GLUCONATE PO, Take 200 mg by mouth as directed. , Disp: , Rfl:  .  NON FORMULARY, , Disp: , Rfl:  .  Polyethyl Glycol-Propyl Glycol (SYSTANE ULTRA OP), Apply to eye 4  (four) times daily., Disp: , Rfl:  .  Simethicone (GAS-X PO), Take by mouth as directed. , Disp: , Rfl:   Allergies  Allergen Reactions  . Statins Tinitus    Achy joints, muscle aches  . Banana Other (See Comments)    migraine  . Chocolate Other (See Comments)    migraine  . Codeine Nausea And Vomiting  . Cranberry Other (See Comments)    Cystitis  . Latex Rash  . Monosodium Glutamate Other (See Comments)    Migraine  . Penicillins Rash    Has patient had a PCN reaction causing immediate rash, facial/tongue/throat swelling, SOB or lightheadedness with hypotension: Yes Has patient had a PCN reaction causing severe rash involving mucus membranes or skin necrosis: No Has patient had a PCN reaction that required hospitalization No Has patient had a PCN reaction occurring within the last 10 years: No If all of the above answers are "NO", then may proceed with Cephalosporin use.   Marland Kitchen Antihistamines, Loratadine-Type     Throat swelling  . Lexapro [Escitalopram]     GI   . Montelukast Other (See Comments)    Throat swelling  . Prednisone Swelling    Throat swelling (no difficulty breathing)  . Sulfites Other (See Comments)    Nausea and headaches  . Zofran [Ondansetron Hcl] Other (See Comments)    Muscle cramps, leg tingling  . Azithromycin Other (See Comments) and Diarrhea    Objective:   VITALS: Per patient if applicable, see vitals. GENERAL: Alert, appears well and in no acute distress. HEENT: Atraumatic, conjunctiva clear, no obvious abnormalities on inspection of external nose and ears. NECK: Normal movements of the head and neck. CARDIOPULMONARY: No increased WOB. Speaking in clear sentences. I:E ratio WNL.  MS: Moves all visible extremities without noticeable abnormality. PSYCH: Pleasant and cooperative, well-groomed. Speech normal rate and rhythm. Affect is appropriate. Insight and judgement are appropriate. Attention is focused, linear, and appropriate.  NEURO: CN  grossly intact. Oriented as arrived to appointment on time with no prompting. Moves both UE equally.  SKIN: No obvious lesions, wounds, erythema, or cyanosis noted on face or hands.  Depression screen [facility] 2/9 04/25/2018 04/06/2017  Decreased Interest 0 0  Down, Depressed, Hopeless 0 0  PHQ - 2 Score 0 0  Altered sleeping 1 -  Tired, decreased energy 0 -  Change in appetite 0 -  Feeling bad or failure about yourself  0 -  Trouble concentrating 0 -  Moving slowly or fidgety/restless 0 -  Suicidal thoughts 0 -  PHQ-9 Score 1 -  Difficult doing work/chores Not difficult at all -    Assessment and Plan:   Natalie Erickson was seen today for follow-up.  Diagnoses and all orders for this visit:  Thoracic spine pain Comments: Strain. Improving with stretching, chiropractor, time. Wants to restart PT.  Orders: -     Ambulatory referral to Physical Therapy  Fibromyalgia Comments: Controlled.   Anxiety Comments: Improved.   Generalized abdominal pain Comments: Resolved. Seeing integrative medicine at [facility]. Notes in care everywhere reveiwed.   Marland Kitchen COVID-19 Education: The signs and symptoms of COVID-19 were discussed with the patient and how to seek care for testing if  needed. The importance of social distancing was discussed today. . Reviewed expectations re: course of current medical issues. . Discussed self-management of symptoms. . Outlined signs and symptoms indicating need for more acute intervention. . Patient verbalized understanding and all questions were answered. Marland Kitchen Health Maintenance issues including appropriate healthy diet, exercise, and smoking avoidance were discussed with patient. . See orders for this visit as documented in the electronic medical record.  Briscoe Deutscher, DO  Records requested if needed. Time spent: 25 minutes, of which >50% was spent in obtaining information about her symptoms, reviewing her previous labs, evaluations, and treatments, counseling her about her  condition (please see the discussed topics above), and developing a plan to further investigate it; she had a number of questions which I addressed.

## 2019-05-14 ENCOUNTER — Encounter: Payer: Self-pay | Admitting: Family Medicine

## 2019-05-14 ENCOUNTER — Ambulatory Visit (INDEPENDENT_AMBULATORY_CARE_PROVIDER_SITE_OTHER): Payer: PPO | Admitting: Family Medicine

## 2019-05-14 VITALS — BP 115/70 | HR 78 | Ht 62.0 in | Wt 135.0 lb

## 2019-05-14 DIAGNOSIS — R1084 Generalized abdominal pain: Secondary | ICD-10-CM

## 2019-05-14 DIAGNOSIS — M797 Fibromyalgia: Secondary | ICD-10-CM | POA: Diagnosis not present

## 2019-05-14 DIAGNOSIS — F419 Anxiety disorder, unspecified: Secondary | ICD-10-CM

## 2019-05-14 DIAGNOSIS — M546 Pain in thoracic spine: Secondary | ICD-10-CM

## 2019-05-16 DIAGNOSIS — M9901 Segmental and somatic dysfunction of cervical region: Secondary | ICD-10-CM | POA: Diagnosis not present

## 2019-05-16 DIAGNOSIS — M9902 Segmental and somatic dysfunction of thoracic region: Secondary | ICD-10-CM | POA: Diagnosis not present

## 2019-05-16 DIAGNOSIS — M791 Myalgia, unspecified site: Secondary | ICD-10-CM | POA: Diagnosis not present

## 2019-05-21 DIAGNOSIS — M9901 Segmental and somatic dysfunction of cervical region: Secondary | ICD-10-CM | POA: Diagnosis not present

## 2019-05-21 DIAGNOSIS — M9902 Segmental and somatic dysfunction of thoracic region: Secondary | ICD-10-CM | POA: Diagnosis not present

## 2019-05-21 DIAGNOSIS — M791 Myalgia, unspecified site: Secondary | ICD-10-CM | POA: Diagnosis not present

## 2019-05-27 ENCOUNTER — Other Ambulatory Visit: Payer: Self-pay

## 2019-05-27 ENCOUNTER — Encounter: Payer: Self-pay | Admitting: *Deleted

## 2019-05-27 ENCOUNTER — Encounter: Payer: Self-pay | Admitting: Medical

## 2019-05-27 ENCOUNTER — Ambulatory Visit (INDEPENDENT_AMBULATORY_CARE_PROVIDER_SITE_OTHER): Payer: PPO | Admitting: Medical

## 2019-05-27 ENCOUNTER — Telehealth: Payer: Self-pay | Admitting: Physician Assistant

## 2019-05-27 VITALS — BP 140/88 | HR 78 | Temp 97.1°F | Resp 12 | Ht 62.0 in | Wt 132.2 lb

## 2019-05-27 DIAGNOSIS — R002 Palpitations: Secondary | ICD-10-CM | POA: Diagnosis not present

## 2019-05-27 DIAGNOSIS — M546 Pain in thoracic spine: Secondary | ICD-10-CM | POA: Diagnosis not present

## 2019-05-27 DIAGNOSIS — E785 Hyperlipidemia, unspecified: Secondary | ICD-10-CM

## 2019-05-27 DIAGNOSIS — F419 Anxiety disorder, unspecified: Secondary | ICD-10-CM | POA: Diagnosis not present

## 2019-05-27 DIAGNOSIS — M797 Fibromyalgia: Secondary | ICD-10-CM | POA: Diagnosis not present

## 2019-05-27 MED ORDER — METOPROLOL TARTRATE 25 MG PO TABS: 12.5000 mg | ORAL_TABLET | Freq: Every day | ORAL | 3 refills | Status: DC

## 2019-05-27 NOTE — Telephone Encounter (Signed)
    COVID-19 Pre-Screening Questions:  . In the past 7 to 10 days have you had a cough,  shortness of breath, headache, congestion, fever (100 or greater) body aches, chills, sore throat, or sudden loss of taste or sense of smell? No -except for body aches due to back injury. . Have you been around anyone with known Covid 19. no . Have you been around anyone who is awaiting Covid 19 test results in the past 7 to 10 days? no . Have you been around anyone who has been exposed to Covid 19, or has mentioned symptoms of Covid 19 within the past 7 to 10 days? no       Pt requesting in office visit today.  Does not want virtual appointment. Pt scheduled to see Almyra Deforest, PA today at 1:15.  Pt aware this appointment is at our [facility] office. She is aware to wear a mask.

## 2019-05-27 NOTE — Telephone Encounter (Signed)
New Message:    Pt said she talked to the doctor on Call last night. He told her to call this morning  And get an appt to come in this morning. She said to see doctor's his note in Epic.  Patient c/o Palpitations:  High priority if patient c/o lightheadedness, shortness of breath, or chest pain  1) How long have you had palpitations/irregular HR/ Afib? Are you having the symptoms now?started about 3 weeks- getting worse, they are consistent  2) Are you currently experiencing lightheadedness, SOB or CP? not at this time  3) Do you have a history of afib (atrial fibrillation) or irregular heart rhythm? no  4) Have you checked your BP or HR? (document readings if available) Last night Blood Pressure was 201/101  5) Are you experiencing any other symptoms? no
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
## 2019-05-27 NOTE — Telephone Encounter (Signed)
Paged by answering service, patient with recent palpitations since injured her back. Took propranolol with improvement. Requesting office visit, will call us back after 8am. Currently asymptomatic.

## 2019-05-27 NOTE — Patient Instructions (Addendum)
Medication Instructions:   START METOPROLOL TARTRATE 12.5 Mg daily   If you need a refill on your cardiac medications before your next appointment, please call your pharmacy.   Lab work:  NONE ordered at this time of appointment   If you have labs (blood work) drawn today and your tests are completely normal, you will receive your results only by: Marland Kitchen MyChart Message (if you have MyChart) OR . A paper copy in the mail If you have any lab test that is abnormal or we need to change your treatment, we will call you to review the results.  Testing/Procedures:  Your physician has recommended that you wear a holter monitor. Holter monitors are medical devices that record the heart's electrical activity. Doctors most often use these monitors to diagnose arrhythmias. Arrhythmias are problems with the speed or rhythm of the heartbeat. The monitor is a small, portable device. You can wear one while you do your normal daily activities. This is usually used to diagnose what is causing palpitations/syncope (passing out).  Your physician has requested that you have a lexiscan myoview. For further information please visit [email or web address]. Please follow instruction sheet, as given.   Follow-Up: At [facility], you and your health needs are our priority.  As part of our continuing mission to provide you with exceptional heart care, we have created designated Provider Care Teams.  These Care Teams include your primary Cardiologist (physician) and Advanced Practice Providers (APPs -  Physician Assistants and Nurse Practitioners) who all work together to provide you with the care you need, when you need it. You will need a follow up appointment in 6-7 months.  Please call our office 2 months in advance to schedule this appointment.  You may see [name], MD or one of the following Advanced Practice Providers on your designated Care Team:   Rosaria Ferries, PA-C . Jory Sims, DNP, ANP  Any  Other Special Instructions Will Be Listed Below (If Applicable).

## 2019-05-27 NOTE — Progress Notes (Signed)
Cardiology Office Note   Date:  05/27/2019   ID:  [name], DOB 1951/08/02, MRN [id number]  PCP:  Briscoe Deutscher, DO  Cardiologist: [name], MD EP: None  Chief Complaint  Patient presents with  . Palpitations      History of Present Illness: Natalie Erickson is a 68 y.o. female with a PMH of HLD, palpitations, anxiety, IBS, fibromyalgia, and depression, who presents for evaluation of palpitations.  She established care with [facility] at an outpatient office visit with Richardson Dopp, PA-C 07/2018. She was seen for the evaluation of palpitations and DOE at that time. She underwent an echocardiogram which showed EF 60-65%, G1DD, no RWMA, and no significant valvular abnormalities. She wore a 48-hr holter monitor which showed occasional PVCs/PACs which corresponded with symptoms. She was prescribed prn propranolol for symptomatic PVCs/PACs and was instructed to f/u in 6 months.   She returns today with complaints of increased palpitations. She reports that she injured her back 3 weeks ago while working in her garden and since then has noticed more frequent palpitations that seem to last for a longer duration than previous episodes. She reports that they are more noticeable at night but she does experience them from time to time during the day. She reported taking 2.5mg  of propranolol last night with improvement in palpitations, however she reported some dry eye and dizziness afterwards. She also reported having elevated blood pressures at home, with BP 201/101 last night with repeat this morning 148/84. She states prior to a few days ago, her BP typically ran in the 120s/70s. She also reports some chest tightness, primarily at rest. Denies exertional chest pain or SOB. Also with increased fatigue and daytime somnolence which is typical of her fibromyalgia but she is wondering about OSA testing as her husband reports that she snores. She reports feeling more anxious than usual which she  feels is likely contributing to her palpitations.    Past Medical History:  Diagnosis Date  . Anxiety 04/06/2017  . Cystitis   . Depression   . Dry eyes 04/06/2017  . Elevated BP without diagnosis of hypertension 04/06/2017   lost weight >> BP improved  . Fibromyalgia   . Heart murmur    hx of 2 Echocardiograms in [place] (pt was told they were normal)  . Hiatal hernia   . History of colon polyps   . History of echocardiogram    Echo 9/19: mild LVH, EF 60-65, no RWMA, Gr 1 DD, trivial MR/TR, PASP 16  . History of Lyme disease   . IBS (irritable bowel syndrome)   . Mixed hyperlipidemia 04/06/2017   intol to statins due to myalgias; never tried Zetia  . TMJ (dislocation of temporomandibular joint)   . Vasomotor rhinitis     Past Surgical History:  Procedure Laterality Date  . APPENDECTOMY  1959  . LAPAROSCOPY    . LEFT OOPHORECTOMY       Current Outpatient Medications  Medication Sig Dispense Refill  . acetaminophen (TYLENOL) 500 MG tablet Take 1,000 mg by mouth every 6 (six) hours as needed for headache (pain).    Marland Kitchen b complex vitamins capsule Take 1 capsule by mouth daily.    . Calcium & Magnesium Carbonates (MYLANTA PO) Take 20 mLs by mouth as directed.     . Cholecalciferol (VITAMIN D) 2000 units tablet Take 2,000 Units by mouth daily.     Marland Kitchen MAGNESIUM GLUCONATE PO Take 200 mg by mouth as directed.     Marland Kitchen  NON FORMULARY     . Polyethyl Glycol-Propyl Glycol (SYSTANE ULTRA OP) Apply to eye 4 (four) times daily.    . AMBULATORY NON FORMULARY MEDICATION Magnesium oil Apply to legs as needed    . AMBULATORY NON FORMULARY MEDICATION Medication Name: probiotic ultimate flora    . docusate sodium (COLACE) 50 MG capsule Take by mouth.    . metoprolol tartrate (LOPRESSOR) 25 MG tablet Take 0.5 tablets (12.5 mg total) by mouth at bedtime. 45 tablet 3  . Simethicone (GAS-X PO) Take by mouth as directed.      No current facility-administered medications for this visit.      Allergies:   Statins; Banana; Chocolate; Codeine; Cranberry; Latex; Monosodium glutamate; Penicillins; Antihistamines, loratadine-type; Lexapro [escitalopram]; Montelukast; Prednisone; Sulfites; Zofran [ondansetron hcl]; and Azithromycin    Social History:  The patient  reports that she has never smoked. She has never used smokeless tobacco. She reports that she does not drink alcohol or use drugs.   Family History:  The patient's family history includes Breast cancer in her sister; Diabetes in her paternal grandmother; Hypertension in her mother; Lung cancer in her father.    ROS:  Please see the history of present illness.   Otherwise, review of systems are positive for none.   All other systems are reviewed and negative.    PHYSICAL EXAM: VS:  BP 140/88   Pulse 78   Temp (!) 97.1 F (36.2 C)   Resp 12   Ht 5\' 2"  (1.575 m)   Wt 132 lb 3.2 oz (60 kg)   SpO2 97%   BMI 24.18 kg/m  , BMI Body mass index is 24.18 kg/m. GEN: Well nourished, well developed, in no acute distress HEENT: sclera anicteric Neck: no JVD Cardiac: RRR; no murmurs, rubs, or gallops; no edema  Respiratory:  clear to auscultation bilaterally, normal work of breathing GI: soft, nontender, nondistended, + BS MS: no deformity or atrophy Skin: warm and dry, no rash Neuro:  Strength and sensation are intact Psych: euthymic mood, full affect   EKG:  EKG is ordered today. The ekg ordered today demonstrates NSR with rate 76bpm, QTC 432, no STE/D, no TWI, no PVCs/PACs   Recent Labs: 06/29/2018: TSH 0.33 11/13/2018: ALT 15; BUN 21; Creatinine, Ser 0.73; Hemoglobin 13.7; Platelets 376.0; Potassium 4.0; Sodium 140    Lipid Panel    Component Value Date/Time   CHOL 228 (H) 06/04/2018 0754   TRIG 73.0 06/04/2018 0754   HDL 61.30 06/04/2018 0754   CHOLHDL 4 06/04/2018 0754   VLDL 14.6 06/04/2018 0754   LDLCALC 152 (H) 06/04/2018 0754      Wt Readings from Last 3 Encounters:  05/27/19 132 lb 3.2 oz (60 kg)   05/14/19 135 lb (61.2 kg)  01/15/19 130 lb (59 kg)      Other studies Reviewed: Additional studies/ records that were reviewed today include:   48 hr Holter Monitor 08/2018: This demonstrates normal sinus rhythm.  There were occasional PVCs and PACs and some pairs/triplets.  Symptoms corresponded with premature beats.  There were no significant arrhythmias.    Echocardiogram 07/2018: Study Conclusions  - Left ventricle: The cavity size was normal. Wall thickness was   increased in a pattern of mild LVH. Systolic function was normal.   The estimated ejection fraction was in the range of 60% to 65%.   Wall motion was normal; there were no regional wall motion   abnormalities. Doppler parameters are consistent with abnormal   left ventricular  relaxation (grade 1 diastolic dysfunction).   Doppler parameters are consistent with indeterminate ventricular   filling pressure. - Aortic valve: Transvalvular velocity was within the normal range.   There was no stenosis. There was no regurgitation. - Mitral valve: Transvalvular velocity was within the normal range.   There was no evidence for stenosis. There was trivial   regurgitation. - Right ventricle: The cavity size was normal. Wall thickness was   normal. Systolic function was normal. - Atrial septum: No defect or patent foramen ovale was identified   by color flow Doppler. - Tricuspid valve: There was trivial regurgitation. - Pulmonic valve: Peak gradient (S): 14 mm Hg. - Pulmonary arteries: Systolic pressure was within the normal   range. PA peak pressure: 16 mm Hg (S).   ASSESSMENT AND PLAN:   1. Palpitations: known history of PVCs/PACs on 48 hr holter monitor 08/2018. Palpitations seem to be exacerbated by pain, as her initial visit 07/2018 was after injuring her back. EKG today with NSR without PVCs/PACs. She reported improvement after taking a small dose of propranolol last night. BP also up. Palpitations are increasing in  frequency/durration. Suspect anxiety/pain driving palpitations.   - Will start metoprolol 12.5mg  qHS for both symptomatic PVCs/PACs and BP control - can uptitrate as tolerated. - Will repeat 48 hr holter monitor to rule out new arrhythmias   2. Elevated BP: likely BP elevated due to anxiety. BP 140/88 today. She states her BP is typically in the 120s/70s at home.  - Will start metoprolol 12.5mg  qHS for both symptomatic PVCs/PACs and BP control - can uptitrate as tolerated  3. Chest tightness: atypical chest tightness which occurs at rest and frequently correlates with palpitations. Patient states she would like to undergo stress testing for peace of mind. Risk factors for CAD include HLD (intolerant to statins). Suspect this is non-cardiac in nature. - Plan for non-urgent exercise stress myoview   4. HLD: LDL 152 on labs 05/2018; she reports intolerance to statins.  - Continue dietary modifications to lower cholesterol  5. Fibromyalgia: increased point tenderness in back, shoulders, and sternum since injuring her back a few weeks ago. She is planning to start PT and chiropractor appointments soon. Likely contributing to chest discomfort - Continue follow-up with integrative medicine/ PCP  6. Anxiety: she reports her anxiety has been worse recently. Likely contributing to #1 and 2.  - Continue follow-up with PCP  7. Fatigue: patient expressed concerns for possible OSA as her husband reports that she snores. She plans to discuss sleep study testing with her PCP - Continue follow-up with PCP    Current medicines are reviewed at length with the patient today.  The patient does not have concerns regarding medicines.  The following changes have been made:  Start metoprolol tartrate 12.5mg  qHS  Labs/ tests ordered today include:   Orders Placed This Encounter  Procedures  . HOLTER MONITOR - 48 HOUR  . MYOCARDIAL PERFUSION IMAGING  . EKG 12-Lead     Disposition:   FU with Dr. [name] in  6 months  Signed, Abigail Butts, PA-C  05/27/2019 2:36 PM

## 2019-05-28 ENCOUNTER — Ambulatory Visit: Payer: PPO | Admitting: Cardiology

## 2019-05-28 ENCOUNTER — Ambulatory Visit (INDEPENDENT_AMBULATORY_CARE_PROVIDER_SITE_OTHER): Payer: PPO | Admitting: Physical Therapy

## 2019-05-28 ENCOUNTER — Encounter: Payer: Self-pay | Admitting: Physical Therapy

## 2019-05-28 DIAGNOSIS — M546 Pain in thoracic spine: Secondary | ICD-10-CM | POA: Diagnosis not present

## 2019-05-28 DIAGNOSIS — M9902 Segmental and somatic dysfunction of thoracic region: Secondary | ICD-10-CM | POA: Diagnosis not present

## 2019-05-28 DIAGNOSIS — M791 Myalgia, unspecified site: Secondary | ICD-10-CM | POA: Diagnosis not present

## 2019-05-28 DIAGNOSIS — M9901 Segmental and somatic dysfunction of cervical region: Secondary | ICD-10-CM | POA: Diagnosis not present

## 2019-05-28 NOTE — Patient Instructions (Signed)
Access Code: [email or web address]  URL: [email or web address]  Date: 05/28/2019  Prepared by: Lyndee Hensen   Exercises  Doorway Pec Stretch at 90 Degrees Abduction - 3 reps - 30 hold - 2x daily  Standing Scapular Retraction - 10 reps - 1 sets - 2x daily  Supine Static Chest Stretch on Foam Roll - 3 reps - 30 hold - 2x daily  Standing Shoulder Posterior Capsule Stretch - 3 reps - 30 hold - 2x daily

## 2019-05-28 NOTE — Therapy (Signed)
[facility] [address] [place], [place], [address] Phone: [phone]   Fax:  [phone]  Physical Therapy Evaluation  Patient Details  Name: Natalie Erickson MRN: [id number] Date of Birth: 1951-10-30 Referring Provider (PT): Dr. Juleen China   Encounter Date: 05/28/2019  PT End of Session - 05/28/19 1038    Visit Number  1    Number of Visits  12    Date for PT Re-Evaluation  07/09/19    Authorization Type  HTA    PT Start Time  0900    PT Stop Time  0940    PT Time Calculation (min)  40 min    Activity Tolerance  Patient tolerated treatment well    Behavior During Therapy  [facility] for tasks assessed/performed       Past Medical History:  Diagnosis Date  . Anxiety 04/06/2017  . Cystitis   . Depression   . Dry eyes 04/06/2017  . Elevated BP without diagnosis of hypertension 04/06/2017   lost weight >> BP improved  . Fibromyalgia   . Heart murmur    hx of 2 Echocardiograms in [place] (pt was told they were normal)  . Hiatal hernia   . History of colon polyps   . History of echocardiogram    Echo 9/19: mild LVH, EF 60-65, no RWMA, Gr 1 DD, trivial MR/TR, PASP 16  . History of Lyme disease   . IBS (irritable bowel syndrome)   . Mixed hyperlipidemia 04/06/2017   intol to statins due to myalgias; never tried Zetia  . TMJ (dislocation of temporomandibular joint)   . Vasomotor rhinitis     Past Surgical History:  Procedure Laterality Date  . APPENDECTOMY  1959  . LAPAROSCOPY    . LEFT OOPHORECTOMY      There were no vitals filed for this visit.   Subjective Assessment - 05/28/19 0902    Subjective  Pt states increased pain in upper/mid back after gardening about 3 weeks ago. She was seen previously in PT for neck pain/posture. Main pain areas now are mid thoracic. She has seen chiro for 1 adjustment. She also reports heart palpitations after incident, that she has seen cardiology for, now on bata blocker. States this has been  more controlled, and pain is improving slowly. She also states doing fast paced twisting of thoraic spine with exercise around time of her pain onset. She attributes some pain/tightness to anxiety.     Limitations  Sitting;Reading;Lifting;House hold activities    Patient Stated Goals  Decreased pain , increased strength    Currently in Pain?  Yes    Pain Score  3     Pain Location  Back    Pain Orientation  Mid    Pain Descriptors / Indicators  Aching;Tightness    Pain Type  Acute pain    Pain Onset  Today    Pain Frequency  Intermittent    Aggravating Factors   bending, sitting, working with arms,     Pain Relieving Factors  walking, exercise.          [facility] PT Assessment - 05/28/19 0001      Assessment   Medical Diagnosis  Thoracic back pain    Referring Provider (PT)  Dr. Timmie Foerster Dominance  Right    Prior Therapy  yes, for neck      Balance Screen   Has the patient fallen in the past 6 months  No  Prior Function   Level of Independence  Independent      Cognition   Overall Cognitive Status  Within Functional Limits for tasks assessed      Posture/Postural Control   Posture Comments  rounded shoulders      ROM / Strength   AROM / PROM / Strength  AROM;Strength      AROM   Overall AROM Comments  Thoracic spine: mild limitations for all motions,  Lumbar spine: mild limitations for ext and SB.      Strength   Overall Strength Comments  Shoulders: 4/5,   Core: 3/5 ;         Palpation   Palpation comment  Tightness and soreness at Bil UT, L>R, and L rhomboid;  Soreness at T4/5 and at T8 region with spring testing                 Objective measurements completed on examination: See above findings.      [facility] PT Treatment/Exercise - 05/28/19 0001      Exercises   Exercises  Lumbar      Lumbar Exercises: Stretches   Lower Trunk Rotation  10 seconds;5 reps    Other Lumbar Stretch Exercise  Doorway stretch 90 deg 30 sec x3;     Other  Lumbar Stretch Exercise  Posterior shoulder stretch 30 sec x2 bil;       Lumbar Exercises: Standing   Other Standing Lumbar Exercises  Scap squeeze x10;              PT Education - 05/28/19 1021    Education Details  POC, exam findings, HEP.     Person(s) Educated  Patient    Methods  Explanation;Demonstration;Verbal cues;Handout    Comprehension  Verbalized understanding;Returned demonstration       PT Short Term Goals - 05/28/19 1043      PT SHORT TERM GOAL #1   Title  Pt to be     Time  2    Period  Weeks    Status  New    Target Date  06/11/19        PT Long Term Goals - 05/28/19 1043      PT LONG TERM GOAL #1   Title  Pt to be independent with long term HEP     Time  6    Period  Weeks    Status  New    Target Date  07/09/19      PT LONG TERM GOAL #2   Title  Pt to report decreased pain to 0-2/10 with activity    Time  6    Period  Weeks    Status  New    Target Date  07/09/19      PT LONG TERM GOAL #3   Title  Pt to demo increased core strength, to be [employer], with minimal postural changes with stabilization exercises, to improve stability and pain    Time  6    Period  Weeks    Status  New    Target Date  07/09/19      PT LONG TERM GOAL #4   Title  Pt to demo ability to lift/carry at least 10 lb with proper mechanics and no pain in back, to improve safety with functional activitiy  .    Time  6    Period  Weeks    Status  New    Target Date  07/09/19  Plan - 05/28/19 1045    Clinical Impression Statement  Pt presents with primary complaint of increased pain in thoracic region. She has tenderness at t-spine, as well as surrounding musculature in rhomboids and UTs. She has decreased ability for full trunk movement and rotation due to pain. She has decreased posture, with rounded shoulders, although pt is very conscious of posture. She has weakness in back and core musculature, and will benefit from education on strengthening for  stability and pain. Pt also to benefit from education on body mechanics with lift/ carry motions. Pt with decreased ability for full functional activities due to pain, and will benefit from skilled PT to improve deficits.     Personal Factors and Comorbidities  Comorbidity 1    Examination-Activity Limitations  Locomotion Level;Reach Overhead;Sleep;Lift    Examination-Participation Restrictions  Cleaning;Driving    Stability/Clinical Decision Making  Stable/Uncomplicated    Clinical Decision Making  Low    Rehab Potential  Good    PT Frequency  2x / week    PT Duration  6 weeks    PT Treatment/Interventions  ADLs/Self Care Home Management;Electrical Stimulation;Cryotherapy;Ultrasound;Moist Heat;Traction;Iontophoresis 4mg /ml Dexamethasone;Stair training;Functional mobility training;Therapeutic activities;Therapeutic exercise;Balance training;Neuromuscular re-education;Manual techniques;Patient/family education;Passive range of motion;Dry needling;Taping;Spinal Manipulations;Joint Manipulations    Consulted and Agree with Plan of Care  Patient       Patient will benefit from skilled therapeutic intervention in order to improve the following deficits and impairments:  Decreased range of motion, Decreased endurance, Increased muscle spasms, Pain, Decreased activity tolerance, Improper body mechanics, Decreased strength  Visit Diagnosis: Pain in thoracic spine     Problem List Patient Active Problem List   Diagnosis Date Noted  . 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 Change in stool 11/14/2018  . Posterior vitreous detachment of right eye 09/10/2018  . History of IBS 01/30/2018  . Urolithiasis 06/23/2017  . Fibromyalgia 06/04/2017  . Chronic fatigue 06/04/2017  . Medication intolerance 06/04/2017  . ANA positive 05/20/2017  . 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 Serum calcium elevated 05/20/2017  . Interstitial cystitis 05/20/2017  . Dyspepsia 05/20/2017  . Mixed hyperlipidemia 04/06/2017   . Elevated BP without diagnosis of hypertension 04/06/2017  . Anxiety 04/06/2017  . Dry eyes 04/06/2017    Lyndee Hensen, PT, DPT 10:51 AM  05/28/19    [facility] [place], [place], [address] Phone: [phone]   Fax:  [phone]  Name: Natalie Erickson MRN: [id number] Date of Birth: 08/17/1951

## 2019-05-30 ENCOUNTER — Other Ambulatory Visit: Payer: Self-pay

## 2019-05-30 ENCOUNTER — Ambulatory Visit (INDEPENDENT_AMBULATORY_CARE_PROVIDER_SITE_OTHER): Payer: PPO | Admitting: Physical Therapy

## 2019-05-30 ENCOUNTER — Encounter: Payer: Self-pay | Admitting: Physical Therapy

## 2019-05-30 ENCOUNTER — Telehealth: Payer: Self-pay | Admitting: Radiology

## 2019-05-30 DIAGNOSIS — M546 Pain in thoracic spine: Secondary | ICD-10-CM | POA: Diagnosis not present

## 2019-05-30 NOTE — Therapy (Signed)
[facility] [address] [place], [place], [address] Phone: [phone]   Fax:  [phone]  Physical Therapy Treatment  Patient Details  Name: Natalie Erickson MRN: [id number] Date of Birth: 1951-01-07 Referring Provider (PT): Dr. Juleen China   Encounter Date: 05/30/2019  PT End of Session - 05/30/19 0949    Visit Number  2    Number of Visits  12    Date for PT Re-Evaluation  07/09/19    Authorization Type  HTA    PT Start Time  0856    PT Stop Time  0943    PT Time Calculation (min)  47 min    Activity Tolerance  Patient tolerated treatment well    Behavior During Therapy  [facility] for tasks assessed/performed       Past Medical History:  Diagnosis Date  . Anxiety 04/06/2017  . Cystitis   . Depression   . Dry eyes 04/06/2017  . Elevated BP without diagnosis of hypertension 04/06/2017   lost weight >> BP improved  . Fibromyalgia   . Heart murmur    hx of 2 Echocardiograms in [place] (pt was told they were normal)  . Hiatal hernia   . History of colon polyps   . History of echocardiogram    Echo 9/19: mild LVH, EF 60-65, no RWMA, Gr 1 DD, trivial MR/TR, PASP 16  . History of Lyme disease   . IBS (irritable bowel syndrome)   . Mixed hyperlipidemia 04/06/2017   intol to statins due to myalgias; never tried Zetia  . TMJ (dislocation of temporomandibular joint)   . Vasomotor rhinitis     Past Surgical History:  Procedure Laterality Date  . APPENDECTOMY  1959  . LAPAROSCOPY    . LEFT OOPHORECTOMY      There were no vitals filed for this visit.  Subjective Assessment - 05/30/19 0855    Subjective  Pt saw chiropractor on Tuesday after our session. She states some increased soreness around thoracic spine and chest/sternum. Pt asks if its normal with this pain, to also have decreased breathing, states she has had this problem previously, but feels like she cant always take a deep breath in.     Limitations   Sitting;Reading;Lifting;House hold activities    Currently in Pain?  Yes    Pain Score  4     Pain Location  Back    Pain Orientation  Mid    Pain Descriptors / Indicators  Aching;Tightness    Pain Type  Acute pain    Pain Onset  1 to 4 weeks ago    Pain Frequency  Intermittent                       [facility] Adult PT Treatment/Exercise - 05/30/19 0923      Posture/Postural Control   Posture Comments  rounded shoulders      Exercises   Exercises  Lumbar      Lumbar Exercises: Stretches   Lower Trunk Rotation  --    Other Lumbar Stretch Exercise  Supine pec stretch 30 sec x2;  Diaphramatic breathing with education x5 min;     Other Lumbar Stretch Exercise  --      Lumbar Exercises: Standing   Row  20 reps    Theraband Level (Row)  Level 2 (Red)    Shoulder Extension  15 reps    Theraband Level (Shoulder Extension)  Level 2 (Red)    Other Standing Lumbar  Exercises  --      Lumbar Exercises: Seated   Other Seated Lumbar Exercises  Thoracic ext x10; thoracic rotation x10;       Lumbar Exercises: Supine   Ab Set  10 reps    Bent Knee Raise  15 reps    Bridge  15 reps               PT Short Term Goals - 05/28/19 1043      PT SHORT TERM GOAL #1   Title  Pt to be     Time  2    Period  Weeks    Status  New    Target Date  06/11/19        PT Long Term Goals - 05/28/19 1043      PT LONG TERM GOAL #1   Title  Pt to be independent with long term HEP     Time  6    Period  Weeks    Status  New    Target Date  07/09/19      PT LONG TERM GOAL #2   Title  Pt to report decreased pain to 0-2/10 with activity    Time  6    Period  Weeks    Status  New    Target Date  07/09/19      PT LONG TERM GOAL #3   Title  Pt to demo increased core strength, to be [facility], with minimal postural changes with stabilization exercises, to improve stability and pain    Time  6    Period  Weeks    Status  New    Target Date  07/09/19      PT LONG TERM GOAL #4    Title  Pt to demo ability to lift/carry at least 10 lb with proper mechanics and no pain in back, to improve safety with functional activitiy  .    Time  6    Period  Weeks    Status  New    Target Date  07/09/19            Plan - 05/30/19 0950    Clinical Impression Statement  Discussed and practiced diaphragmatic breathing today. Reviewed posture and educated on TA contraction with most core exercises. Pt with decreased tightness at thoracic musculature today, but does have soreness with palpation and PA of thoracic spine. Plan to progress postural and core strength, as well as posture and breathing education.     Personal Factors and Comorbidities  Comorbidity 1    Examination-Activity Limitations  Locomotion Level;Reach Overhead;Sleep;Lift    Examination-Participation Restrictions  Cleaning;Driving    Stability/Clinical Decision Making  Stable/Uncomplicated    Rehab Potential  Good    PT Frequency  2x / week    PT Duration  6 weeks    PT Treatment/Interventions  ADLs/Self Care Home Management;Electrical Stimulation;Cryotherapy;Ultrasound;Moist Heat;Traction;Iontophoresis 4mg /ml Dexamethasone;Stair training;Functional mobility training;Therapeutic activities;Therapeutic exercise;Balance training;Neuromuscular re-education;Manual techniques;Patient/family education;Passive range of motion;Dry needling;Taping;Spinal Manipulations;Joint Manipulations    Consulted and Agree with Plan of Care  Patient       Patient will benefit from skilled therapeutic intervention in order to improve the following deficits and impairments:  Decreased range of motion, Decreased endurance, Increased muscle spasms, Pain, Decreased activity tolerance, Improper body mechanics, Decreased strength  Visit Diagnosis: Pain in thoracic spine     Problem List Patient Active Problem List   Diagnosis Date Noted  . 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 Change in  stool 11/14/2018  . Posterior vitreous detachment  of right eye 09/10/2018  . History of IBS 01/30/2018  . Urolithiasis 06/23/2017  . Fibromyalgia 06/04/2017  . Chronic fatigue 06/04/2017  . Medication intolerance 06/04/2017  . ANA positive 05/20/2017  . 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 Serum calcium elevated 05/20/2017  . Interstitial cystitis 05/20/2017  . Dyspepsia 05/20/2017  . Mixed hyperlipidemia 04/06/2017  . Elevated BP without diagnosis of hypertension 04/06/2017  . Anxiety 04/06/2017  . Dry eyes 04/06/2017    Lyndee Hensen, PT, DPT 9:52 AM  05/30/19    [facility] [place], [place], [address] Phone: [phone]   Fax:  [phone]  Name: Natalie Erickson MRN: [id number] Date of Birth: 1951-09-14

## 2019-05-30 NOTE — Telephone Encounter (Signed)
Returned call to patient. Enrolled her for a 3 day Zio long term monitor to be mailed. Brief instructions were gone over and she knows to expect it to arrive in 3-4 days.  *48hr holter was switched to a 3 day Zio so monitor could be mailed

## 2019-05-30 NOTE — Telephone Encounter (Signed)
LVM for patient to return call to verify insurance/address to have monitor mailed and briefly go over instructions.

## 2019-05-30 NOTE — Telephone Encounter (Signed)
F/U Message             Patient is returning Katy's call would like a call back.

## 2019-05-31 ENCOUNTER — Encounter: Payer: Self-pay | Admitting: Physician Assistant

## 2019-05-31 ENCOUNTER — Ambulatory Visit (INDEPENDENT_AMBULATORY_CARE_PROVIDER_SITE_OTHER): Payer: PPO | Admitting: Physician Assistant

## 2019-05-31 ENCOUNTER — Telehealth: Payer: Self-pay | Admitting: Medical

## 2019-05-31 ENCOUNTER — Ambulatory Visit (INDEPENDENT_AMBULATORY_CARE_PROVIDER_SITE_OTHER): Payer: PPO

## 2019-05-31 VITALS — BP 142/80 | HR 80 | Ht 62.0 in | Wt 135.0 lb

## 2019-05-31 DIAGNOSIS — M47814 Spondylosis without myelopathy or radiculopathy, thoracic region: Secondary | ICD-10-CM | POA: Diagnosis not present

## 2019-05-31 DIAGNOSIS — T887XXA Unspecified adverse effect of drug or medicament, initial encounter: Secondary | ICD-10-CM | POA: Diagnosis not present

## 2019-05-31 DIAGNOSIS — M546 Pain in thoracic spine: Secondary | ICD-10-CM

## 2019-05-31 MED ORDER — DILTIAZEM HCL 30 MG PO TABS: 30.0000 mg | ORAL_TABLET | Freq: Two times a day (BID) | ORAL | 2 refills | Status: DC

## 2019-05-31 NOTE — Telephone Encounter (Signed)
The patient has been made aware to stop the Metoprolol and is agreeable to start Diltiazem 30 mg bid. She will keep track of her blood pressure and heart rate and call back if anything further is needed.  The pharmacy has been called and notified that Diltiazem will replace the Metoprolol.

## 2019-05-31 NOTE — Telephone Encounter (Signed)
Noted patient intolerant to propranolol and metoprolol.  We can try diltiazem immediate release.  1. Please start diltiazem 30mg  twice daily if patient agreeable.   2. Okay to stop metoprolol

## 2019-05-31 NOTE — Telephone Encounter (Signed)
Call returned to the patient. She stated that since she started the Metoprolol lopressor 12.5 mg on 6/1 that she has been having nausea, ringing in her ears, dry mouth, diarrhea, and restlessness.  Her blood pressure this morning was 159/81 and heart rate was 73. She takes the Metoprolol at night.   She did state that her palpitations are better but the side effects have been severe. She has been advised to rest and stay hydrated and that we would call her back with recommendations.

## 2019-05-31 NOTE — Progress Notes (Signed)
Virtual Visit via Video   I connected with Natalie Erickson on 05/31/19 at 10:00 AM EDT by a video enabled telemedicine application and verified that I am speaking with the correct person using two identifiers. Location patient: Home Location provider: [facility], Office Persons participating in the virtual visit: Shaniquia Brafford, Inda Coke PA-C, Josepha Pigg CMA  I discussed the limitations of evaluation and management by telemedicine and the availability of in person appointments. The patient expressed understanding and agreed to proceed.  I ,Jasper Loser, acting as a Education administrator for [employer], PA.,have documented all relevant documentation on the behalf of Inda Coke, PA,as directed by  Inda Coke, PA while in the presence of Inda Coke, [place].  Subjective:   HPI:   Back Pain Pt c/o T-spine pain, worse between her shoulder blades. Pain has been present x 1 month. She was working in the garden and doing some exercising. Pain radiates into both shoulders and the left side of her neck. Pain does keep her up at night. She also c/o palpitations at night. She has been taking Tylenol, heat, ice, topical analgesics, she has gone to PT, and has seen a chiropractor ([facility]) with some relief. She does typically have slightly increased discomfort the first couple of days after therapy. She was provided with HEP from PT which she has been doing twice daily. Cannot take NSAID's d/t IBS. No recent imaging of back or neck.   She also endorses palpitations, nausea and diarrhea. She has a call in with her cardiologist about this because she feels as though it is related to recently starting metoprolol. BP this morning was 142/81 per her report. Denies: CP, SOB, dizziness, lightheadedness.  ROS: See pertinent positives and negatives per HPI.  Patient Active Problem List   Diagnosis Date Noted  . 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 Change in stool 11/14/2018  . Posterior  vitreous detachment of right eye 09/10/2018  . History of IBS 01/30/2018  . Urolithiasis 06/23/2017  . Fibromyalgia 06/04/2017  . Chronic fatigue 06/04/2017  . Medication intolerance 06/04/2017  . ANA positive 05/20/2017  . 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 Serum calcium elevated 05/20/2017  . Interstitial cystitis 05/20/2017  . Dyspepsia 05/20/2017  . Mixed hyperlipidemia 04/06/2017  . Elevated BP without diagnosis of hypertension 04/06/2017  . Anxiety 04/06/2017  . Dry eyes 04/06/2017    Social History   Tobacco Use  . Smoking status: Never Smoker  . Smokeless tobacco: Never Used  Substance Use Topics  . Alcohol use: No    Current Outpatient Medications:  .  acetaminophen (TYLENOL) 500 MG tablet, Take 1,000 mg by mouth every 6 (six) hours as needed for headache (pain)., Disp: , Rfl:  .  AMBULATORY NON FORMULARY MEDICATION, Medication Name: probiotic ultimate flora, Disp: , Rfl:  .  b complex vitamins capsule, Take 1 capsule by mouth daily., Disp: , Rfl:  .  Calcium & Magnesium Carbonates (MYLANTA PO), Take 20 mLs by mouth as directed. , Disp: , Rfl:  .  Cholecalciferol (VITAMIN D) 2000 units tablet, Take 2,000 Units by mouth daily. , Disp: , Rfl:  .  Cyanocobalamin (VITAMIN B-12 PO), Take by mouth., Disp: , Rfl:  .  docusate sodium (COLACE) 50 MG capsule, Take by mouth., Disp: , Rfl:  .  MAGNESIUM GLUCONATE PO, Take 200 mg by mouth as directed. , Disp: , Rfl:  .  metoprolol tartrate (LOPRESSOR) 25 MG tablet, Take 0.5 tablets (12.5 mg total) by mouth at bedtime., Disp: 45  tablet, Rfl: 3 .  NON FORMULARY, , Disp: , Rfl:  .  Polyethyl Glycol-Propyl Glycol (SYSTANE ULTRA OP), Apply to eye 4 (four) times daily., Disp: , Rfl:  .  Simethicone (GAS-X PO), Take by mouth as directed. , Disp: , Rfl:   Allergies  Allergen Reactions  . Statins Tinitus    Achy joints, muscle aches  . Banana Other (See Comments)    migraine  . Chocolate Other (See Comments)    migraine  . Codeine  Nausea And Vomiting  . Cranberry Other (See Comments)    Cystitis  . Latex Rash  . Monosodium Glutamate Other (See Comments)    Migraine  . Penicillins Rash    Has patient had a PCN reaction causing immediate rash, facial/tongue/throat swelling, SOB or lightheadedness with hypotension: Yes Has patient had a PCN reaction causing severe rash involving mucus membranes or skin necrosis: No Has patient had a PCN reaction that required hospitalization No Has patient had a PCN reaction occurring within the last 10 years: No If all of the above answers are "NO", then may proceed with Cephalosporin use.   Marland Kitchen Antihistamines, Loratadine-Type     Throat swelling  . Eggs Or Egg-Derived Products   . Ginger   . Gluten Meal   . Lexapro [Escitalopram]     GI   . Montelukast Other (See Comments)    Throat swelling  . Prednisone Swelling    Throat swelling (no difficulty breathing)  . Sulfites Other (See Comments)    Nausea and headaches  . Vanilla   . Whey   . Zofran [Ondansetron Hcl] Other (See Comments)    Muscle cramps, leg tingling  . Azithromycin Other (See Comments) and Diarrhea    Objective:   VITALS: Per patient if applicable, see vitals. GENERAL: Alert, appears well and in no acute distress. HEENT: Atraumatic, conjunctiva clear, no obvious abnormalities on inspection of external nose and ears. NECK: Normal movements of the head and neck. CARDIOPULMONARY: No increased WOB. Speaking in clear sentences. I:E ratio WNL.  MS: Moves all visible extremities without noticeable abnormality. PSYCH: Pleasant and cooperative, well-groomed. Speech normal rate and rhythm. Affect is appropriate. Insight and judgement are appropriate. Attention is focused, linear, and appropriate.  NEURO: CN grossly intact. Oriented as arrived to appointment on time with no prompting. Moves both UE equally.  SKIN: No obvious lesions, wounds, erythema, or cyanosis noted on face or hands.  Assessment and Plan:    Natalie Erickson was seen today for acute visit and back pain.  Diagnoses and all orders for this visit:  Pain in thoracic spine Patient requesting xray for further evaluation. I have placed this order for her. Further management based on results. Continues to see PT and chiropractor. No red flags. -     DG Thoracic Spine W/Swimmers; Future  Medication side effect Currently in no acute distress. I recommended that she continue to follow cardiology's advice and await their recommendations.   . Reviewed expectations re: course of current medical issues. . Discussed self-management of symptoms. . Outlined signs and symptoms indicating need for more acute intervention. . Patient verbalized understanding and all questions were answered. Marland Kitchen Health Maintenance issues including appropriate healthy diet, exercise, and smoking avoidance were discussed with patient. . See orders for this visit as documented in the electronic medical record.  I discussed the assessment and treatment plan with the patient. The patient was provided an opportunity to ask questions and all were answered. The patient agreed with the plan and  demonstrated an understanding of the instructions.   The patient was advised to call back or seek an in-person evaluation if the symptoms worsen or if the condition fails to improve as anticipated.   CMA or LPN served as scribe during this visit. History, Physical, and Plan performed by medical provider. The above documentation has been reviewed and is accurate and complete.   [place], [place] 05/31/2019

## 2019-05-31 NOTE — Telephone Encounter (Signed)
Appreciate your assistance, Raquel! Agree with plan to trial different class of medications. She reports extreme sensitivity to medications so would not be surprise if she note some intolerance with diltiazem as well. Will continue to follow.   Abigail Butts, PA-C 05/31/19; 2:09 PM

## 2019-05-31 NOTE — Telephone Encounter (Signed)
New message   Pt c/o medication issue:  1. Name of Medication: metoprolol tartrate (LOPRESSOR) 25 MG tablet  2. How are you currently taking this medication (dosage and times per day)? 1 time daily in evening   3. Are you having a reaction (difficulty breathing--STAT)? Yes, patient states that she is having sob   4. What is your medication issue?patient is having reaction to medicine ring in ears, diarrehea, nausea, sore throat. Please call.

## 2019-06-03 ENCOUNTER — Telehealth: Payer: Self-pay | Admitting: Medical

## 2019-06-03 ENCOUNTER — Encounter: Payer: PPO | Admitting: Physical Therapy

## 2019-06-03 ENCOUNTER — Telehealth: Payer: Self-pay | Admitting: Family Medicine

## 2019-06-03 MED ORDER — CARVEDILOL 6.25 MG PO TABS: 3.1250 mg | ORAL_TABLET | Freq: Two times a day (BID) | ORAL | 3 refills | Status: DC

## 2019-06-03 NOTE — Telephone Encounter (Signed)
Returned call to pt she states that she has a stress test on 6-11 and states that she will put on the monitor Friday. Also she states taht she has not had palpitations so she does not want to start the carvedilol she states that she will closely monitor her BP and HR and the palpitations and will start the medication if the palpitations start again. She states that she only gets the palpitations with her back pain she wants to see if the pain goes away.

## 2019-06-03 NOTE — Telephone Encounter (Signed)
Follow Up:     Pt said she just finished talking to you. She says she have another question she needs to ask.

## 2019-06-03 NOTE — Telephone Encounter (Signed)
Patient is coming in for PT tomorrow would like to have labs done then if ok. Let her know wanted to check with you and see what you wanted to have done.

## 2019-06-03 NOTE — Telephone Encounter (Signed)
Pt states she started taking new Rx diltiazem starting Saturday (morning and evening) and Sunday morning work up with 'headache, nausea, really bad leg cramps, not feeling [herself] at all'. She states that d/t nausea she had a hard time eating and she also states that she was constipated yesterday morning, but does state she has hx IBS.  Pt states she feels better this AM and cramping is gone. She states that she was recently switched from metoprolol to diltiazem because she was having the following symptoms: nausea, sob, 'just really uncomfortable' in general, diarrhea, headache, sinuses bothering her, sore throat, 'dry eyes and dry everything'. She states she has issues with dryness on diltiazem as well. Pt wants to know if this means med will be changed again. Informed pt that message would be routed to pharmD for review. Pt verbalized understanding

## 2019-06-03 NOTE — Telephone Encounter (Signed)
Does pt not have orders in for labs, does pt need in office visit?  Copied from [place] [id number]. Topic: Appointment Scheduling - Scheduling Inquiry for Clinic >> Jun 03, 2019  9:39 AM Rutherford Nail, NT wrote: Reason for CRM: Patient calling to schedule an appointment to have her cholesterol checked. States that she is over due to have this done. Also, would like to discuss cholesterol medications. Attempted office x3, ringing busy. Please advise.

## 2019-06-03 NOTE — Telephone Encounter (Signed)
Pt c/o medication issue:  1. Name of Medication: Diltiazem  2. How are you currently taking this medication (dosage and times per day)?  30 mg 2 times a day  3. Are you having a reaction (difficulty breathing--STAT)? no  4. What is your medication issue? Leg cramping,headache,nausea and feels real tired

## 2019-06-03 NOTE — Telephone Encounter (Signed)
Patient experienced adverse reaction to propranolol, metoprolol, and diltiazem. Verapamil not recommended because will cause more severe constipation than diltiazem.   Okay to hold diltiazem for now. Defer further recommendation to K Krorger/Dr [name]. Need to know how severe are the PVCs/PACs before further recommendation given.

## 2019-06-03 NOTE — Telephone Encounter (Signed)
Okay.  If she does not want to take the medication we can wait Call if symptoms worsen

## 2019-06-03 NOTE — Telephone Encounter (Signed)
She may stop the diltiazem. She should start coreg 3.125 mg BID. She will need a follow up appointment with her cardiologist-Dr.Acharya,  to evaluate her response to new medication. She is to take her BP sitting down after 5 minutes. She needs to take the BP one hour after taking the medication.

## 2019-06-03 NOTE — Telephone Encounter (Signed)
RETURNED CALL TO PT SHE STATES THAT SHE HAS NOT TAKEN THE DILTIAZEM D/T S/E. SHE WILL START THE COREG

## 2019-06-03 NOTE — Telephone Encounter (Signed)
Spoke with pt and advised to hold diltiazem for now per pharmD until further recommendations received from one of the providers.  Will route to APP on site today, Jory Sims, DNP, for further recommendations

## 2019-06-03 NOTE — Telephone Encounter (Signed)
I am more than happy to help but is there a specific reason this was routed to me today? Can this be addressed by DOD? Thanks, GA

## 2019-06-04 ENCOUNTER — Telehealth: Payer: Self-pay | Admitting: Student

## 2019-06-04 ENCOUNTER — Other Ambulatory Visit: Payer: Self-pay

## 2019-06-04 ENCOUNTER — Other Ambulatory Visit (INDEPENDENT_AMBULATORY_CARE_PROVIDER_SITE_OTHER): Payer: PPO

## 2019-06-04 ENCOUNTER — Encounter: Payer: Self-pay | Admitting: Physical Therapy

## 2019-06-04 ENCOUNTER — Telehealth: Payer: Self-pay | Admitting: Medical

## 2019-06-04 ENCOUNTER — Ambulatory Visit: Payer: PPO | Admitting: Family Medicine

## 2019-06-04 ENCOUNTER — Ambulatory Visit (INDEPENDENT_AMBULATORY_CARE_PROVIDER_SITE_OTHER): Payer: PPO | Admitting: Physical Therapy

## 2019-06-04 ENCOUNTER — Ambulatory Visit (INDEPENDENT_AMBULATORY_CARE_PROVIDER_SITE_OTHER): Payer: PPO

## 2019-06-04 DIAGNOSIS — E559 Vitamin D deficiency, unspecified: Secondary | ICD-10-CM | POA: Diagnosis not present

## 2019-06-04 DIAGNOSIS — R030 Elevated blood-pressure reading, without diagnosis of hypertension: Secondary | ICD-10-CM | POA: Diagnosis not present

## 2019-06-04 DIAGNOSIS — R002 Palpitations: Secondary | ICD-10-CM | POA: Diagnosis not present

## 2019-06-04 DIAGNOSIS — E538 Deficiency of other specified B group vitamins: Secondary | ICD-10-CM

## 2019-06-04 DIAGNOSIS — E782 Mixed hyperlipidemia: Secondary | ICD-10-CM

## 2019-06-04 DIAGNOSIS — M546 Pain in thoracic spine: Secondary | ICD-10-CM | POA: Diagnosis not present

## 2019-06-04 LAB — CBC WITH DIFFERENTIAL/PLATELET
Basophils Absolute: 0.1 10*3/uL (ref 0.0–0.1)
Basophils Relative: 0.9 % (ref 0.0–3.0)
Eosinophils Absolute: 0 10*3/uL (ref 0.0–0.7)
Eosinophils Relative: 0 % (ref 0.0–5.0)
HCT: 40.8 % (ref 36.0–46.0)
Hemoglobin: 13.7 g/dL (ref 12.0–15.0)
Lymphocytes Relative: 13.3 % (ref 12.0–46.0)
Lymphs Abs: 1.4 10*3/uL (ref 0.7–4.0)
MCHC: 33.6 g/dL (ref 30.0–36.0)
MCV: 93.5 fl (ref 78.0–100.0)
Monocytes Absolute: 0.5 10*3/uL (ref 0.1–1.0)
Monocytes Relative: 4.8 % (ref 3.0–12.0)
Neutro Abs: 8.8 10*3/uL — ABNORMAL HIGH (ref 1.4–7.7)
Neutrophils Relative %: 81 % — ABNORMAL HIGH (ref 43.0–77.0)
Platelets: 424 10*3/uL — ABNORMAL HIGH (ref 150.0–400.0)
RBC: 4.36 Mil/uL (ref 3.87–5.11)
RDW: 13.4 % (ref 11.5–15.5)
WBC: 10.9 10*3/uL — ABNORMAL HIGH (ref 4.0–10.5)

## 2019-06-04 LAB — VITAMIN B12: Vitamin B-12: 716 pg/mL (ref 211–911)

## 2019-06-04 LAB — TSH: TSH: 0.64 u[IU]/mL (ref 0.35–4.50)

## 2019-06-04 LAB — COMPREHENSIVE METABOLIC PANEL
ALT: 10 U/L (ref 0–35)
AST: 12 U/L (ref 0–37)
Albumin: 4.8 g/dL (ref 3.5–5.2)
Alkaline Phosphatase: 128 U/L — ABNORMAL HIGH (ref 39–117)
BUN: 21 mg/dL (ref 6–23)
CO2: 30 mEq/L (ref 19–32)
Calcium: 10.5 mg/dL (ref 8.4–10.5)
Chloride: 101 mEq/L (ref 96–112)
Creatinine, Ser: 0.73 mg/dL (ref 0.40–1.20)
GFR: 79.39 mL/min (ref >60.00)
Glucose, Bld: 98 mg/dL (ref 70–99)
Potassium: 4.1 mEq/L (ref 3.5–5.1)
Sodium: 139 mEq/L (ref 135–145)
Total Bilirubin: 0.4 mg/dL (ref 0.2–1.2)
Total Protein: 7.6 g/dL (ref 6.0–8.3)

## 2019-06-04 LAB — LIPID PANEL
Cholesterol: 224 mg/dL — ABNORMAL HIGH (ref 0–200)
HDL: 66.9 mg/dL (ref >39.00)
LDL Cholesterol: 138 mg/dL — ABNORMAL HIGH (ref 0–99)
NonHDL: 156.95
Total CHOL/HDL Ratio: 3
Triglycerides: 96 mg/dL (ref 0.0–149.0)
VLDL: 19.2 mg/dL (ref 0.0–40.0)

## 2019-06-04 LAB — T4, FREE: Free T4: 0.89 ng/dL (ref 0.60–1.60)

## 2019-06-04 LAB — VITAMIN D 25 HYDROXY (VIT D DEFICIENCY, FRACTURES): VITD: 47.65 ng/mL (ref 30.00–100.00)

## 2019-06-04 NOTE — Telephone Encounter (Signed)
CBC, CMP, TSH, free T4, FLP, vit D, vit B12

## 2019-06-04 NOTE — Telephone Encounter (Signed)
Patient states she is feeling nauseous last night and this morning- does seem better right now- 170/90 and 120 HR last night   BP this morning 126/72 HR 78 Holter monitor came into the mail yesterday and will wear it today for 3 days- cancelled stress test due to the monitor and will call to reschedule.  Coreg started last night at 7 pm, woke up with nausea, difficulty breathing. Patient does not want to take it again, she states she will take the magnesium, and does not want to take anything until the monitor results comes back. Patient will have lab work completed- and see if her PCP can place her on a statin if she can tolerate them.

## 2019-06-04 NOTE — Telephone Encounter (Signed)
noted 

## 2019-06-04 NOTE — Telephone Encounter (Signed)
Labs in and app made.

## 2019-06-04 NOTE — Telephone Encounter (Signed)
I have read over this, and will allow Mrs. Tommye Standard PA to address.  Will await cardiac monitor results. It was my understanding that she was not going to take the coreg? This was based on last phone note yesterday.

## 2019-06-04 NOTE — Telephone Encounter (Signed)
   Received page from answering service. Called and spoke with patient. Patient took first dose of Coreg 3.125mg  twice daily last night around 7pm and then noticed onset of nausea, dizziness, and mild shortness of breath around 11pm. BP was 140/91 and pulse 127 bpm. She reports some heart racing with this but denied any palpitations like she has had in the past. Patient was able to drink some water and go back to bed. She woke up this morning still feeling nauseous and a little lightheaded but stated her shortness of breath had improved. BP 144/81 and pulse 102 bpm this morning. This is the third rate controlled medication that has been tried in the last week (Metoprolol, Diltiazem, and now Coreg). Patient has had side effects with all of these. Patient's Zio monitor just came in the mail. Advised patient to go ahead and start the monitor. Patient wants to push back her stress test until after she finishes the [facility] monitor. I think this would be okay to do but advised patient to call back to reschedule once the office is open. Advised patient to stop taking the Coreg. Will hold off on starting any additional rate control medications until results of Zio monitor are back given she has been unable to tolerate all medications we have tried.  Of note, patient has a history of anxiety and thinks this is playing a role. I agree. Advised patient to talk with PCP.   Advised patient to call our office if symptoms worsened or if she developed any chest pain, shortness of breath, or near syncope. Patient voiced understanding and thanked me for calling.   Darreld Mclean, PA-C 06/04/2019 7:27 AM

## 2019-06-04 NOTE — Telephone Encounter (Signed)
Okay for patient to hold medication until results of cardiac monitor come back. Suspect her anxiety is contributing to elevated blood pressure and heart rate, which is being exacerbated by side effects of beta blocker trials. Hopeful event monitor will provide reassurance for patient and medications will not be necessary going forward. Could consider prn dosing of atenolol in the future is palpitations continue to be bothersome given intolerance to carvedilol, propranolol, metoprolol, and diltiazem.

## 2019-06-04 NOTE — Patient Instructions (Signed)
Access Code: [email or web address]  URL: [email or web address]  Date: 06/04/2019  Prepared by: Faustino Congress   Exercises  Doorway Pec Stretch at 90 Degrees Abduction - 3 reps - 30 hold - 2x daily  Standing Scapular Retraction - 10 reps - 1 sets - 2x daily  Supine Static Chest Stretch on Foam Roll - 3 reps - 30 hold - 2x daily  Standing Shoulder Posterior Capsule Stretch - 3 reps - 30 hold - 2x daily  Supine Posterior Pelvic Tilt - 10 reps - 1 sets - 5 sec hold - 2x daily - 7x weekly  Supine March - 10 reps - 1 sets - 2x daily - 7x weekly  Supine Bridge - 10 reps - 1 sets - 5 sec hold - 2x daily - 7x weekly

## 2019-06-04 NOTE — Telephone Encounter (Signed)
Patient stated previously she would wait until monitor results came back.  Agreement by PA.

## 2019-06-04 NOTE — Therapy (Signed)
[facility] [address] [place], [place], [address] Phone: [phone]   Fax:  [phone]  Physical Therapy Treatment  Patient Details  Name: Natalie Erickson MRN: [id number] Date of Birth: [birth year] Referring Provider (PT): Dr. Juleen China   Encounter Date: 06/04/2019  PT End of Session - 06/04/19 0944    Visit Number  3    Number of Visits  12    Date for PT Re-Evaluation  07/09/19    Authorization Type  HTA    PT Start Time  0855    PT Stop Time  0940    PT Time Calculation (min)  45 min    Activity Tolerance  Patient tolerated treatment well    Behavior During Therapy  [facility] for tasks assessed/performed       Past Medical History:  Diagnosis Date  . Anxiety 04/06/2017  . Cystitis   . Depression   . Dry eyes 04/06/2017  . Elevated BP without diagnosis of hypertension 04/06/2017   lost weight >> BP improved  . Fibromyalgia   . Heart murmur    hx of 2 Echocardiograms in [place] (pt was told they were normal)  . Hiatal hernia   . History of colon polyps   . History of echocardiogram    Echo 9/19: mild LVH, EF 60-65, no RWMA, Gr 1 DD, trivial MR/TR, PASP 16  . History of Lyme disease   . IBS (irritable bowel syndrome)   . Mixed hyperlipidemia 04/06/2017   intol to statins due to myalgias; never tried Zetia  . TMJ (dislocation of temporomandibular joint)   . Vasomotor rhinitis     Past Surgical History:  Procedure Laterality Date  . APPENDECTOMY  1959  . LAPAROSCOPY    . LEFT OOPHORECTOMY      There were no vitals filed for this visit.  Subjective Assessment - 06/04/19 0856    Subjective  cardiologist has been working to manage medications which she's having a hard time tolerating.  will be wearing holter monitor x 3 days.  back pain is less severe.  worse in afternoon after increased activity.     Patient Stated Goals  Decreased pain , increased strength    Currently in Pain?  Yes    Pain Score  2     Pain Location   Back    Pain Orientation  Mid    Pain Descriptors / Indicators  Aching;Tightness    Pain Type  Acute pain    Pain Onset  1 to 4 weeks ago    Pain Frequency  Intermittent    Aggravating Factors   bending, sitting, working with arms    Pain Relieving Factors  walking, exercise                       [facility] Adult PT Treatment/Exercise - 06/04/19 0900      Lumbar Exercises: Stretches   Other Lumbar Stretch Exercise  Supine pec stretch x 4 min;  low door way stretch 2x30 sec     Other Lumbar Stretch Exercise  sidelying book-opener 10x5 sec; Posterior shoulder stretch 30 sec x2 bil      Lumbar Exercises: Standing   Row  20 reps    Theraband Level (Row)  Level 2 (Red)    Shoulder Extension  20 reps;Theraband    Theraband Level (Shoulder Extension)  Level 2 (Red)      Lumbar Exercises: Supine   Ab Set  10 reps  Bent Knee Raise  10 reps    Bridge  10 reps      Manual Therapy   Manual Therapy  Soft tissue mobilization    Soft tissue mobilization  IASTM to bil rhomboids, infraspinatus, upper traps and levator scapulae             PT Education - 06/04/19 0944    Education Details  HEP - added core    Person(s) Educated  Patient    Methods  Explanation;Demonstration;Handout    Comprehension  Verbalized understanding;Returned demonstration;Need further instruction       PT Short Term Goals - 05/28/19 1043      PT SHORT TERM GOAL #1   Title  Pt to be     Time  2    Period  Weeks    Status  New    Target Date  06/11/19        PT Long Term Goals - 05/28/19 1043      PT LONG TERM GOAL #1   Title  Pt to be independent with long term HEP     Time  6    Period  Weeks    Status  New    Target Date  07/09/19      PT LONG TERM GOAL #2   Title  Pt to report decreased pain to 0-2/10 with activity    Time  6    Period  Weeks    Status  New    Target Date  07/09/19      PT LONG TERM GOAL #3   Title  Pt to demo increased core strength, to be [facility], with  minimal postural changes with stabilization exercises, to improve stability and pain    Time  6    Period  Weeks    Status  New    Target Date  07/09/19      PT LONG TERM GOAL #4   Title  Pt to demo ability to lift/carry at least 10 lb with proper mechanics and no pain in back, to improve safety with functional activitiy  .    Time  6    Period  Weeks    Status  New    Target Date  07/09/19            Plan - 06/04/19 0944    Clinical Impression Statement  Pt tolerated session well today, and modified doorway stretch to hands down, palms out as she was reporting increased pain with mid level stretch.  Overall progressing well.  Feel pt would benefit from DN, wants to hold as she'll be wearing holter monitor at next visit.    Personal Factors and Comorbidities  Comorbidity 1    Examination-Activity Limitations  Locomotion Level;Reach Overhead;Sleep;Lift    Examination-Participation Restrictions  Cleaning;Driving    Stability/Clinical Decision Making  Stable/Uncomplicated    Rehab Potential  Good    PT Frequency  2x / week    PT Duration  6 weeks    PT Treatment/Interventions  ADLs/Self Care Home Management;Electrical Stimulation;Cryotherapy;Ultrasound;Moist Heat;Traction;Iontophoresis 4mg /ml Dexamethasone;Stair training;Functional mobility training;Therapeutic activities;Therapeutic exercise;Balance training;Neuromuscular re-education;Manual techniques;Patient/family education;Passive range of motion;Dry needling;Taping;Spinal Manipulations;Joint Manipulations    PT Next Visit Plan  review/progress core. continue upper back strengthening, manual/DN PRN    Consulted and Agree with Plan of Care  Patient       Patient will benefit from skilled therapeutic intervention in order to improve the following deficits and impairments:  Decreased range of motion, Decreased endurance,  Increased muscle spasms, Pain, Decreased activity tolerance, Improper body mechanics, Decreased strength  Visit  Diagnosis: Pain in thoracic spine     Problem List Patient Active Problem List   Diagnosis Date Noted  . 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 Change in stool 11/14/2018  . Posterior vitreous detachment of right eye 09/10/2018  . History of IBS 01/30/2018  . Urolithiasis 06/23/2017  . Fibromyalgia 06/04/2017  . Chronic fatigue 06/04/2017  . Medication intolerance 06/04/2017  . ANA positive 05/20/2017  . 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 Serum calcium elevated 05/20/2017  . Interstitial cystitis 05/20/2017  . Dyspepsia 05/20/2017  . Mixed hyperlipidemia 04/06/2017  . Elevated BP without diagnosis of hypertension 04/06/2017  . Anxiety 04/06/2017  . Dry eyes 04/06/2017      Laureen Abrahams, PT, DPT 06/04/19 9:47 AM    [facility] [place], [place], [address] Phone: [phone]   Fax:  [phone]  Name: Natalie Erickson MRN: [id number] Date of Birth: 01-Jul-1951

## 2019-06-04 NOTE — Telephone Encounter (Signed)
New Message    Pt is calling to inform Roby Lofts that she stopped taking all 3 medications and now she is feeling better since she stopped, its been 12 hours since she last took her medication     Please call

## 2019-06-05 ENCOUNTER — Encounter: Payer: Self-pay | Admitting: Family Medicine

## 2019-06-05 ENCOUNTER — Ambulatory Visit (INDEPENDENT_AMBULATORY_CARE_PROVIDER_SITE_OTHER): Payer: PPO | Admitting: Family Medicine

## 2019-06-05 ENCOUNTER — Encounter: Payer: PPO | Admitting: Physical Therapy

## 2019-06-05 VITALS — BP 118/83 | HR 98 | Temp 98.0°F | Ht 62.0 in | Wt 135.0 lb

## 2019-06-05 DIAGNOSIS — E782 Mixed hyperlipidemia: Secondary | ICD-10-CM

## 2019-06-05 DIAGNOSIS — Z789 Other specified health status: Secondary | ICD-10-CM

## 2019-06-05 DIAGNOSIS — R002 Palpitations: Secondary | ICD-10-CM | POA: Diagnosis not present

## 2019-06-05 DIAGNOSIS — I700 Atherosclerosis of aorta: Secondary | ICD-10-CM | POA: Diagnosis not present

## 2019-06-05 NOTE — Progress Notes (Signed)
Virtual Visit via Video   Due to the COVID-19 pandemic, this visit was completed with telemedicine (audio/video) technology to reduce patient and provider exposure as well as to preserve personal protective equipment.   I connected with Natalie Erickson by a video enabled telemedicine application and verified that I am speaking with the correct person using two identifiers. Location patient: Home Location provider: [facility], Office Persons participating in the virtual visit: Kassidy Dockendorf, Briscoe Deutscher, DO [name], CMA acting as scribe for Dr. Briscoe Deutscher.   I discussed the limitations of evaluation and management by telemedicine and the availability of in person appointments. The patient expressed understanding and agreed to proceed.  Care Team   Patient Care Team: Briscoe Deutscher, DO as PCP - General (Family Medicine) [name], MD as PCP - Cardiology (Cardiology) Loletha Carrow Kirke Corin, MD as Consulting Physician (Gastroenterology) Murrell Redden Earlyne Iba, MD as Consulting Physician (Obstetrics and Gynecology) Hortencia Pilar, MD as Consulting Physician (Surgery) Lyndee Hensen, PT as Physical Therapist (Physical Therapy) Kennith Center, RD as Dietitian (Family Medicine)  Subjective:   HPI: Patient would like to go over lab work. She has not been able to tolerate statin in the past due to side effects, but is willing to try something if her levels are high. She is currently wearing an event monitor for the next two days. She has follow up with them after monitor and stress test.   Dg Thoracic Spine W/swimmers  Result Date: 05/31/2019 CLINICAL DATA:  Thoracic spine pain for 1 month.  No known injury. EXAM: THORACIC SPINE - 3 VIEWS COMPARISON:  PA and lateral chest 05/03/2017. FINDINGS: Vertebral body height and alignment are maintained. Mild anterior endplate spurring in the midthoracic spine is noted. Paraspinous structures demonstrate aortic atherosclerosis.  IMPRESSION: No acute abnormality. Mild appearing midthoracic spondylosis. Atherosclerosis. Electronically Signed   By: Inge Rise M.D.   On: 05/31/2019 16:13   Results for orders placed or performed in visit on 06/04/19  VITAMIN D 25 Hydroxy (Vit-D Deficiency, Fractures)  Result Value Ref Range   VITD 47.65 30.00 - 100.00 ng/mL  Vitamin B12  Result Value Ref Range   Vitamin B-12 716 211 - 911 pg/mL  TSH  Result Value Ref Range   TSH 0.64 0.35 - 4.50 uIU/mL  T4, free  Result Value Ref Range   Free T4 0.89 0.60 - 1.60 ng/dL  Lipid panel  Result Value Ref Range   Cholesterol 224 (H) 0 - 200 mg/dL   Triglycerides 96.0 0.0 - 149.0 mg/dL   HDL 66.90 >39.00 mg/dL   VLDL 19.2 0.0 - 40.0 mg/dL   LDL Cholesterol 138 (H) 0 - 99 mg/dL   Total CHOL/HDL Ratio 3    NonHDL 156.95   Comprehensive metabolic panel  Result Value Ref Range   Sodium 139 135 - 145 mEq/L   Potassium 4.1 3.5 - 5.1 mEq/L   Chloride 101 96 - 112 mEq/L   CO2 30 19 - 32 mEq/L   Glucose, Bld 98 70 - 99 mg/dL   BUN 21 6 - 23 mg/dL   Creatinine, Ser 0.73 0.40 - 1.20 mg/dL   Total Bilirubin 0.4 0.2 - 1.2 mg/dL   Alkaline Phosphatase 128 (H) 39 - 117 U/L   AST 12 0 - 37 U/L   ALT 10 0 - 35 U/L   Total Protein 7.6 6.0 - 8.3 g/dL   Albumin 4.8 3.5 - 5.2 g/dL   Calcium 10.5 8.4 - 10.5 mg/dL  GFR 79.39 >60.00 mL/min  CBC with Differential/Platelet  Result Value Ref Range   WBC 10.9 (H) 4.0 - 10.5 K/uL   RBC 4.36 3.87 - 5.11 Mil/uL   Hemoglobin 13.7 12.0 - 15.0 g/dL   HCT 40.8 36.0 - 46.0 %   MCV 93.5 78.0 - 100.0 fl   MCHC 33.6 30.0 - 36.0 g/dL   RDW 13.4 11.5 - 15.5 %   Platelets 424.0 (H) 150.0 - 400.0 K/uL   Neutrophils Relative % 81.0 (H) 43.0 - 77.0 %   Lymphocytes Relative 13.3 12.0 - 46.0 %   Monocytes Relative 4.8 3.0 - 12.0 %   Eosinophils Relative 0.0 0.0 - 5.0 %   Basophils Relative 0.9 0.0 - 3.0 %   Neutro Abs 8.8 (H) 1.4 - 7.7 K/uL   Lymphs Abs 1.4 0.7 - 4.0 K/uL   Monocytes Absolute 0.5 0.1 -  1.0 K/uL   Eosinophils Absolute 0.0 0.0 - 0.7 K/uL   Basophils Absolute 0.1 0.0 - 0.1 K/uL   Review of Systems  Constitutional: Negative for chills and fever.  HENT: Negative for hearing loss and tinnitus.   Eyes: Negative for blurred vision, double vision and photophobia.  Respiratory: Negative for cough and wheezing.   Cardiovascular: Negative for chest pain and palpitations.  Gastrointestinal: Negative for heartburn, nausea and vomiting.  Genitourinary: Negative for dysuria, frequency and urgency.  Musculoskeletal: Negative for myalgias and neck pain.  Neurological: Negative for dizziness and headaches.  Psychiatric/Behavioral: Negative for depression and suicidal ideas.    Patient Active Problem List   Diagnosis Date Noted  . 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 Change in stool 11/14/2018  . Posterior vitreous detachment of right eye 09/10/2018  . History of IBS 01/30/2018  . Urolithiasis 06/23/2017  . Fibromyalgia 06/04/2017  . Chronic fatigue 06/04/2017  . Medication intolerance 06/04/2017  . ANA positive 05/20/2017  . 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 Serum calcium elevated 05/20/2017  . Interstitial cystitis 05/20/2017  . Dyspepsia 05/20/2017  . Mixed hyperlipidemia 04/06/2017  . Elevated BP without diagnosis of hypertension 04/06/2017  . Anxiety 04/06/2017  . Dry eyes 04/06/2017    Social History   Tobacco Use  . Smoking status: Never Smoker  . Smokeless tobacco: Never Used  Substance Use Topics  . Alcohol use: No    Current Outpatient Medications:  .  acetaminophen (TYLENOL) 500 MG tablet, Take 1,000 mg by mouth every 6 (six) hours as needed for headache (pain)., Disp: , Rfl:  .  Calcium & Magnesium Carbonates (MYLANTA PO), Take 20 mLs by mouth as directed. , Disp: , Rfl:  .  Cholecalciferol (VITAMIN D) 2000 units tablet, Take 2,000 Units by mouth daily. , Disp: , Rfl:  .  Cyanocobalamin (VITAMIN B-12 PO), Take by mouth., Disp: , Rfl:  .  docusate sodium  (COLACE) 50 MG capsule, Take by mouth., Disp: , Rfl:  .  MAGNESIUM GLUCONATE PO, Take 200 mg by mouth as directed. , Disp: , Rfl:  .  NON FORMULARY, , Disp: , Rfl:  .  Polyethyl Glycol-Propyl Glycol (SYSTANE ULTRA OP), Apply to eye 4 (four) times daily., Disp: , Rfl:  .  Simethicone (GAS-X PO), Take by mouth as directed. , Disp: , Rfl:  .  b complex vitamins capsule, Take 1 capsule by mouth daily., Disp: , Rfl:   Allergies  Allergen Reactions  . Statins Tinitus    Achy joints, muscle aches Achy joints, muscle aches  . Banana Other (See Comments)    migraine  .  Chocolate Other (See Comments)    migraine  . Cocoa Other (See Comments)    migraine migraine  . Codeine Nausea And Vomiting  . Cranberry Other (See Comments)    Cystitis  . Latex Rash  . Monosodium Glutamate Other (See Comments)    Migraine  . Penicillins Rash    Has patient had a PCN reaction causing immediate rash, facial/tongue/throat swelling, SOB or lightheadedness with hypotension: Yes Has patient had a PCN reaction causing severe rash involving mucus membranes or skin necrosis: No Has patient had a PCN reaction that required hospitalization No Has patient had a PCN reaction occurring within the last 10 years: No If all of the above answers are "NO", then may proceed with Cephalosporin use.   Marland Kitchen Antihistamines, Loratadine-Type     Throat swelling  . Carvedilol Other (See Comments)    N&N, SOB , Head ache and joint pain.   . Eggs Or Egg-Derived Products   . Ginger   . Gluten Meal   . Lexapro [Escitalopram]     GI   . Montelukast Other (See Comments)    Throat swelling  . Other Swelling    Throat swelling  . Prednisone Swelling    Throat swelling (no difficulty breathing)  . Sulfites Other (See Comments)    Nausea and headaches  . Vanilla   . Whey   . Zofran [Ondansetron Hcl] Other (See Comments)    Muscle cramps, leg tingling  . Azithromycin Other (See Comments) and Diarrhea    Objective:   VITALS:  Per patient if applicable, see vitals. GENERAL: Alert, appears well and in no acute distress. HEENT: Atraumatic, conjunctiva clear, no obvious abnormalities on inspection of external nose and ears. NECK: Normal movements of the head and neck. CARDIOPULMONARY: No increased WOB. Speaking in clear sentences. I:E ratio WNL.  MS: Moves all visible extremities without noticeable abnormality. PSYCH: Pleasant and cooperative, well-groomed. Speech normal rate and rhythm. Affect is appropriate. Insight and judgement are appropriate. Attention is focused, linear, and appropriate.  NEURO: CN grossly intact. Oriented as arrived to appointment on time with no prompting. Moves both UE equally.  SKIN: No obvious lesions, wounds, erythema, or cyanosis noted on face or hands.  Depression screen [facility] 2/9 04/25/2018 04/06/2017  Decreased Interest 0 0  Down, Depressed, Hopeless 0 0  PHQ - 2 Score 0 0  Altered sleeping 1 -  Tired, decreased energy 0 -  Change in appetite 0 -  Feeling bad or failure about yourself  0 -  Trouble concentrating 0 -  Moving slowly or fidgety/restless 0 -  Suicidal thoughts 0 -  PHQ-9 Score 1 -  Difficult doing work/chores Not difficult at all -   Assessment and Plan:   Jayme was seen today for follow-up.  Diagnoses and all orders for this visit:  Mixed hyperlipidemia Comments: Stable. See AVS.  Aortic atherosclerosis ([place]) Comments: CT.   Medication intolerance  Palpitations Comments: Improving with improvement of back pain. Wearing Holter now.    Marland Kitchen COVID-19 Education: The signs and symptoms of COVID-19 were discussed with the patient and how to seek care for testing if needed. The importance of social distancing was discussed today. . Reviewed expectations re: course of current medical issues. . Discussed self-management of symptoms. . Outlined signs and symptoms indicating need for more acute intervention. . Patient verbalized understanding and all questions  were answered. Marland Kitchen Health Maintenance issues including appropriate healthy diet, exercise, and smoking avoidance were discussed with patient. . See orders  for this visit as documented in the electronic medical record.  Briscoe Deutscher, DO  Records requested if needed. Time spent: 25 minutes, of which >50% was spent in obtaining information about her symptoms, reviewing her previous labs, evaluations, and treatments, counseling her about her condition (please see the discussed topics above), and developing a plan to further investigate it; she had a number of questions which I addressed.

## 2019-06-06 ENCOUNTER — Ambulatory Visit (HOSPITAL_COMMUNITY): Admission: RE | Admit: 2019-06-06 | Payer: PPO | Source: Ambulatory Visit | Attending: Medical | Admitting: Medical

## 2019-06-06 ENCOUNTER — Ambulatory Visit (INDEPENDENT_AMBULATORY_CARE_PROVIDER_SITE_OTHER): Payer: PPO | Admitting: Physical Therapy

## 2019-06-06 ENCOUNTER — Encounter: Payer: Self-pay | Admitting: Physical Therapy

## 2019-06-06 ENCOUNTER — Other Ambulatory Visit: Payer: Self-pay

## 2019-06-06 DIAGNOSIS — M546 Pain in thoracic spine: Secondary | ICD-10-CM | POA: Diagnosis not present

## 2019-06-06 NOTE — Therapy (Signed)
[place], [place], [address] Phone: [phone]   Fax:  [phone]  Physical Therapy Treatment  Patient Details  Name: Natalie Erickson MRN: [id number] Date of Birth: Aug 28, 1951 Referring Provider (PT): Dr. Juleen China   Encounter Date: 06/06/2019  PT End of Session - 06/06/19 1322    Visit Number  4    Number of Visits  12    Date for PT Re-Evaluation  07/09/19    Authorization Type  HTA    PT Start Time  1000    PT Stop Time  1046    PT Time Calculation (min)  46 min    Activity Tolerance  Patient tolerated treatment well    Behavior During Therapy  [facility] for tasks assessed/performed       Past Medical History:  Diagnosis Date  . Anxiety 04/06/2017  . Cystitis   . Depression   . Dry eyes 04/06/2017  . Elevated BP without diagnosis of hypertension 04/06/2017   lost weight >> BP improved  . Fibromyalgia   . Heart murmur    hx of 2 Echocardiograms in [place] (pt was told they were normal)  . Hiatal hernia   . History of colon polyps   . History of echocardiogram    Echo 9/19: mild LVH, EF 60-65, no RWMA, Gr 1 DD, trivial MR/TR, PASP 16  . History of Lyme disease   . IBS (irritable bowel syndrome)   . Mixed hyperlipidemia 04/06/2017   intol to statins due to myalgias; never tried Zetia  . TMJ (dislocation of temporomandibular joint)   . Vasomotor rhinitis     Past Surgical History:  Procedure Laterality Date  . APPENDECTOMY  1959  . LAPAROSCOPY    . LEFT OOPHORECTOMY      There were no vitals filed for this visit.  Subjective Assessment - 06/06/19 1320    Subjective  Pt still wearing heart monitor, until tomorrow. Does think her back pain is improving. She states more soreness/tightness in UT/neck region vs upper thoracic region.    Limitations  Sitting;Reading;Lifting;Writing;House hold activities    Patient Stated Goals  Decreased pain , increased strength    Currently in Pain?  Yes    Pain  Score  3     Pain Location  Back    Pain Orientation  Right;Left    Pain Descriptors / Indicators  Aching    Pain Type  Acute pain    Pain Onset  1 to 4 weeks ago    Pain Frequency  Intermittent                       [facility] Adult PT Treatment/Exercise - 06/06/19 1005      Lumbar Exercises: Stretches   Other Lumbar Stretch Exercise  Supine pec stretch x 3 min with UE nerve glide;  low door way stretch 2x30 sec     Other Lumbar Stretch Exercise  --      Lumbar Exercises: Standing   Row  20 reps    Theraband Level (Row)  Level 2 (Red)    Shoulder Extension  --    Theraband Level (Shoulder Extension)  --      Lumbar Exercises: Seated   Other Seated Lumbar Exercises  Thoracic rotation holding light ball x10;     Other Seated Lumbar Exercises  Overhead press using light/unweighted ball x10;       Lumbar Exercises: Supine   Clam  20 reps  Clam Limitations  RTB with TA    Bent Knee Raise  20 reps    Bridge  15 reps    Other Supine Lumbar Exercises  Cane shoulder flexion x15;       Manual Therapy   Manual Therapy  Soft tissue mobilization    Soft tissue mobilization  STM/DTM to bil cervical paraspinals, and L UT and levator;                PT Short Term Goals - 05/28/19 1043      PT SHORT TERM GOAL #1   Title  Pt to be     Time  2    Period  Weeks    Status  New    Target Date  06/11/19        PT Long Term Goals - 05/28/19 1043      PT LONG TERM GOAL #1   Title  Pt to be independent with long term HEP     Time  6    Period  Weeks    Status  New    Target Date  07/09/19      PT LONG TERM GOAL #2   Title  Pt to report decreased pain to 0-2/10 with activity    Time  6    Period  Weeks    Status  New    Target Date  07/09/19      PT LONG TERM GOAL #3   Title  Pt to demo increased core strength, to be [facility], with minimal postural changes with stabilization exercises, to improve stability and pain    Time  6    Period  Weeks    Status  New     Target Date  07/09/19      PT LONG TERM GOAL #4   Title  Pt to demo ability to lift/carry at least 10 lb with proper mechanics and no pain in back, to improve safety with functional activitiy  .    Time  6    Period  Weeks    Status  New    Target Date  07/09/19            Plan - 06/06/19 1324    Clinical Impression Statement  Pt with improving tightness in upper back, more tightness in neck today, addressed with DTM and ther ex. Pt with minimal pain during session today. She was able to progress ther ex, with only feelings of "pulling" across chest with pec stretch. Plan to progress as tolerated.    Personal Factors and Comorbidities  Comorbidity 1    Examination-Activity Limitations  Locomotion Level;Reach Overhead;Sleep;Lift    Examination-Participation Restrictions  Cleaning;Driving    Stability/Clinical Decision Making  Stable/Uncomplicated    Rehab Potential  Good    PT Frequency  2x / week    PT Duration  6 weeks    PT Treatment/Interventions  ADLs/Self Care Home Management;Electrical Stimulation;Cryotherapy;Ultrasound;Moist Heat;Traction;Iontophoresis 4mg /ml Dexamethasone;Stair training;Functional mobility training;Therapeutic activities;Therapeutic exercise;Balance training;Neuromuscular re-education;Manual techniques;Patient/family education;Passive range of motion;Dry needling;Taping;Spinal Manipulations;Joint Manipulations    PT Next Visit Plan  review/progress core. continue upper back strengthening, manual/DN PRN    Consulted and Agree with Plan of Care  Patient       Patient will benefit from skilled therapeutic intervention in order to improve the following deficits and impairments:  Decreased range of motion, Decreased endurance, Increased muscle spasms, Pain, Decreased activity tolerance, Improper body mechanics, Decreased strength  Visit Diagnosis: Pain in thoracic spine  Problem List Patient Active Problem List   Diagnosis Date Noted  .  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 Change in stool 11/14/2018  . Posterior vitreous detachment of right eye 09/10/2018  . History of IBS 01/30/2018  . Urolithiasis 06/23/2017  . Fibromyalgia 06/04/2017  . Chronic fatigue 06/04/2017  . Medication intolerance 06/04/2017  . ANA positive 05/20/2017  . 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 Serum calcium elevated 05/20/2017  . Interstitial cystitis 05/20/2017  . Dyspepsia 05/20/2017  . Mixed hyperlipidemia 04/06/2017  . Elevated BP without diagnosis of hypertension 04/06/2017  . Anxiety 04/06/2017  . Dry eyes 04/06/2017    Lyndee Hensen, PT, DPT 1:25 PM  06/06/19    [facility] [place], [place], [address] Phone: [phone]   Fax:  [phone]  Name: Natalie Erickson MRN: [id number] Date of Birth: 25-Oct-1951

## 2019-06-07 DIAGNOSIS — M9902 Segmental and somatic dysfunction of thoracic region: Secondary | ICD-10-CM | POA: Diagnosis not present

## 2019-06-07 DIAGNOSIS — M9901 Segmental and somatic dysfunction of cervical region: Secondary | ICD-10-CM | POA: Diagnosis not present

## 2019-06-07 DIAGNOSIS — M791 Myalgia, unspecified site: Secondary | ICD-10-CM | POA: Diagnosis not present

## 2019-06-10 DIAGNOSIS — Z77010 Contact with and (suspected) exposure to arsenic: Secondary | ICD-10-CM | POA: Diagnosis not present

## 2019-06-10 DIAGNOSIS — Z77018 Contact with and (suspected) exposure to other hazardous metals: Secondary | ICD-10-CM | POA: Diagnosis not present

## 2019-06-10 DIAGNOSIS — Z77011 Contact with and (suspected) exposure to lead: Secondary | ICD-10-CM | POA: Diagnosis not present

## 2019-06-11 ENCOUNTER — Other Ambulatory Visit: Payer: Self-pay

## 2019-06-11 ENCOUNTER — Ambulatory Visit (INDEPENDENT_AMBULATORY_CARE_PROVIDER_SITE_OTHER): Payer: PPO | Admitting: Physical Therapy

## 2019-06-11 ENCOUNTER — Encounter: Payer: Self-pay | Admitting: Physical Therapy

## 2019-06-11 ENCOUNTER — Telehealth: Payer: Self-pay | Admitting: Physical Therapy

## 2019-06-11 DIAGNOSIS — M546 Pain in thoracic spine: Secondary | ICD-10-CM

## 2019-06-11 NOTE — Telephone Encounter (Signed)
Entered in error

## 2019-06-11 NOTE — Therapy (Signed)
[facility] [address] [place], [place], [address] Phone: [phone]   Fax:  [phone]  Physical Therapy Treatment  Patient Details  Name: Natalie Erickson MRN: [id number] Date of Birth: 07-11-1951 Referring Provider (PT): Dr. Juleen China   Encounter Date: 06/11/2019  PT End of Session - 06/11/19 1147    Visit Number  5    Number of Visits  12    Date for PT Re-Evaluation  07/09/19    Authorization Type  HTA    PT Start Time  0955    PT Stop Time  1040    PT Time Calculation (min)  45 min    Activity Tolerance  Patient tolerated treatment well    Behavior During Therapy  [facility] for tasks assessed/performed       Past Medical History:  Diagnosis Date  . Anxiety 04/06/2017  . Cystitis   . Depression   . Dry eyes 04/06/2017  . Elevated BP without diagnosis of hypertension 04/06/2017   lost weight >> BP improved  . Fibromyalgia   . Heart murmur    hx of 2 Echocardiograms in [place] (pt was told they were normal)  . Hiatal hernia   . History of colon polyps   . History of echocardiogram    Echo 9/19: mild LVH, EF 60-65, no RWMA, Gr 1 DD, trivial MR/TR, PASP 16  . History of Lyme disease   . IBS (irritable bowel syndrome)   . Mixed hyperlipidemia 04/06/2017   intol to statins due to myalgias; never tried Zetia  . TMJ (dislocation of temporomandibular joint)   . Vasomotor rhinitis     Past Surgical History:  Procedure Laterality Date  . APPENDECTOMY  1959  . LAPAROSCOPY    . LEFT OOPHORECTOMY      There were no vitals filed for this visit.  Subjective Assessment - 06/11/19 1146    Subjective  Pt done with heart monitor, hasnt gotten results yet. She states decreasing pain, but has R mid thoracic pain especially with R rotation.    Currently in Pain?  Yes    Pain Score  3     Pain Location  Back    Pain Orientation  Right    Pain Descriptors / Indicators  Aching    Pain Type  Acute pain    Pain Onset  1 to 4 weeks ago     Pain Frequency  Intermittent                       [facility] Adult PT Treatment/Exercise - 06/11/19 1008      Lumbar Exercises: Stretches   Other Lumbar Stretch Exercise  Supine pec stretch x1 min;   low and mid door way stretch 2x30 sec       Lumbar Exercises: Standing   Row  20 reps    Theraband Level (Row)  Level 3 (Green)    Other Standing Lumbar Exercises  UE flex and scaption, x10 each;  Wall angels(modified) x10;     Other Standing Lumbar Exercises  Wall push ups x15;       Lumbar Exercises: Seated   Other Seated Lumbar Exercises  Thoracic extension and rotation with overpressure x10;     Other Seated Lumbar Exercises  --      Lumbar Exercises: Supine   Clam  20 reps    Clam Limitations  RTB , alternating    Bent Knee Raise  20 reps    Bridge  15 reps    Other Supine Lumbar Exercises  --      Manual Therapy   Manual Therapy  Soft tissue mobilization    Soft tissue mobilization  STM/DTM and IASTM to R UT, levator, rhomboid region.               PT Short Term Goals - 06/11/19 1148      PT SHORT TERM GOAL #1   Title  Pt to be independent with initial HEP    Time  2    Period  Weeks    Status  Achieved    Target Date  06/11/19        PT Long Term Goals - 05/28/19 1043      PT LONG TERM GOAL #1   Title  Pt to be independent with long term HEP     Time  6    Period  Weeks    Status  New    Target Date  07/09/19      PT LONG TERM GOAL #2   Title  Pt to report decreased pain to 0-2/10 with activity    Time  6    Period  Weeks    Status  New    Target Date  07/09/19      PT LONG TERM GOAL #3   Title  Pt to demo increased core strength, to be [facility], with minimal postural changes with stabilization exercises, to improve stability and pain    Time  6    Period  Weeks    Status  New    Target Date  07/09/19      PT LONG TERM GOAL #4   Title  Pt to demo ability to lift/carry at least 10 lb with proper mechanics and no pain in back, to  improve safety with functional activitiy  .    Time  6    Period  Weeks    Status  New    Target Date  07/09/19            Plan - 06/11/19 1149    Clinical Impression Statement  Pt with improving ability for mobility, and for ther ex. Worked on core/lumbar stabs as well as Development worker, community. Discussed her trying to return to mild tai chi exercises as well. Pt with trigger points and soreness in R rhomboid, levator and UT, addressed withDTM today. PT to benefit from continued care.    Personal Factors and Comorbidities  Comorbidity 1    Examination-Activity Limitations  Locomotion Level;Reach Overhead;Sleep;Lift    Examination-Participation Restrictions  Cleaning;Driving    Stability/Clinical Decision Making  Stable/Uncomplicated    Rehab Potential  Good    PT Frequency  2x / week    PT Duration  6 weeks    PT Treatment/Interventions  ADLs/Self Care Home Management;Electrical Stimulation;Cryotherapy;Ultrasound;Moist Heat;Traction;Iontophoresis '4mg'$ /ml Dexamethasone;Stair training;Functional mobility training;Therapeutic activities;Therapeutic exercise;Balance training;Neuromuscular re-education;Manual techniques;Patient/family education;Passive range of motion;Dry needling;Taping;Spinal Manipulations;Joint Manipulations    PT Next Visit Plan  review/progress core. continue upper back strengthening, manual/DN PRN    Consulted and Agree with Plan of Care  Patient       Patient will benefit from skilled therapeutic intervention in order to improve the following deficits and impairments:  Decreased range of motion, Decreased endurance, Increased muscle spasms, Pain, Decreased activity tolerance, Improper body mechanics, Decreased strength  Visit Diagnosis: 1. Pain in thoracic spine        Problem List Patient Active Problem List   Diagnosis Date  Noted  . 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 Change in stool 11/14/2018  . Posterior vitreous detachment of right eye  09/10/2018  . History of IBS 01/30/2018  . Urolithiasis 06/23/2017  . Fibromyalgia 06/04/2017  . Chronic fatigue 06/04/2017  . Medication intolerance 06/04/2017  . ANA positive 05/20/2017  . 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 Serum calcium elevated 05/20/2017  . Interstitial cystitis 05/20/2017  . Dyspepsia 05/20/2017  . Mixed hyperlipidemia 04/06/2017  . Elevated BP without diagnosis of hypertension 04/06/2017  . Anxiety 04/06/2017  . Dry eyes 04/06/2017    Lyndee Hensen, PT, DPT 11:55 AM  06/11/19    [facility] [place], [place], [address] Phone: [phone]   Fax:  [phone]  Name: Natalie Erickson MRN: [id number] Date of Birth: Aug 03, 1951

## 2019-06-13 ENCOUNTER — Other Ambulatory Visit: Payer: Self-pay

## 2019-06-13 ENCOUNTER — Encounter: Payer: Self-pay | Admitting: Physical Therapy

## 2019-06-13 ENCOUNTER — Ambulatory Visit (INDEPENDENT_AMBULATORY_CARE_PROVIDER_SITE_OTHER): Payer: PPO | Admitting: Physical Therapy

## 2019-06-13 DIAGNOSIS — M546 Pain in thoracic spine: Secondary | ICD-10-CM | POA: Diagnosis not present

## 2019-06-13 NOTE — Therapy (Signed)
[facility] [address] [place], [place], [address] Phone: [phone]   Fax:  [phone]  Physical Therapy Treatment  Patient Details  Name: Natalie Erickson MRN: [id number] Date of Birth: [birth year] Referring Provider (PT): Dr. Juleen China   Encounter Date: 06/13/2019  PT End of Session - 06/13/19 0952    Visit Number  6    Number of Visits  12    Date for PT Re-Evaluation  07/09/19    Authorization Type  HTA    PT Start Time  0900    PT Stop Time  0946    PT Time Calculation (min)  46 min    Activity Tolerance  Patient tolerated treatment well    Behavior During Therapy  [facility] for tasks assessed/performed       Past Medical History:  Diagnosis Date  . Anxiety 04/06/2017  . Cystitis   . Depression   . Dry eyes 04/06/2017  . Elevated BP without diagnosis of hypertension 04/06/2017   lost weight >> BP improved  . Fibromyalgia   . Heart murmur    hx of 2 Echocardiograms in [place] (pt was told they were normal)  . Hiatal hernia   . History of colon polyps   . History of echocardiogram    Echo 9/19: mild LVH, EF 60-65, no RWMA, Gr 1 DD, trivial MR/TR, PASP 16  . History of Lyme disease   . IBS (irritable bowel syndrome)   . Mixed hyperlipidemia 04/06/2017   intol to statins due to myalgias; never tried Zetia  . TMJ (dislocation of temporomandibular joint)   . Vasomotor rhinitis     Past Surgical History:  Procedure Laterality Date  . APPENDECTOMY  1959  . LAPAROSCOPY    . LEFT OOPHORECTOMY      There were no vitals filed for this visit.  Subjective Assessment - 06/13/19 1327    Subjective  Pt states that she is sore today, having fibromyalgia flare up, but back/thoracic pain is improving. She has mild soreness in R thoracic region. Has been able to do HEP.    Currently in Pain?  Yes    Pain Score  3     Pain Location  Back    Pain Orientation  Upper    Pain Descriptors / Indicators  Aching    Pain Type  Acute pain     Pain Onset  More than a month ago    Pain Frequency  Intermittent                       [facility] Adult PT Treatment/Exercise - 06/13/19 0900      Lumbar Exercises: Stretches   Other Lumbar Stretch Exercise  Supine pec stretch x1 min with nerve glide;   low and mid door way stretch 2x30 sec : Supine ER butterfly stretch x15;     Other Lumbar Stretch Exercise  Standing, post shoulder stretch 30 sec x2 bil;       Lumbar Exercises: Standing   Row  20 reps    Theraband Level (Row)  Level 3 (Green)    Other Standing Lumbar Exercises   Wall angels(modified) x10;     Other Standing Lumbar Exercises  Wall push ups x15;       Lumbar Exercises: Seated   Other Seated Lumbar Exercises  --      Lumbar Exercises: Supine   Clam  20 reps    Clam Limitations  RTB  Bridge  15 reps    Single Leg Bridge  10 reps      Manual Therapy   Manual Therapy  Soft tissue mobilization;Joint mobilization    Joint Mobilization  Thoracic PA mobs gr 3    Soft tissue mobilization  STM/DTM and IASTM to B UT, levator, rhomboid region.               PT Short Term Goals - 06/11/19 1148      PT SHORT TERM GOAL #1   Title  Pt to be independent with initial HEP    Time  2    Period  Weeks    Status  Achieved    Target Date  06/11/19        PT Long Term Goals - 05/28/19 1043      PT LONG TERM GOAL #1   Title  Pt to be independent with long term HEP     Time  6    Period  Weeks    Status  New    Target Date  07/09/19      PT LONG TERM GOAL #2   Title  Pt to report decreased pain to 0-2/10 with activity    Time  6    Period  Weeks    Status  New    Target Date  07/09/19      PT LONG TERM GOAL #3   Title  Pt to demo increased core strength, to be [facility], with minimal postural changes with stabilization exercises, to improve stability and pain    Time  6    Period  Weeks    Status  New    Target Date  07/09/19      PT LONG TERM GOAL #4   Title  Pt to demo ability to  lift/carry at least 10 lb with proper mechanics and no pain in back, to improve safety with functional activitiy  .    Time  6    Period  Weeks    Status  New    Target Date  07/09/19            Plan - 06/13/19 1331    Clinical Impression Statement  Pt with other soreness today, but back seems to be improving. She continues to have tenderness with palpation of thoracic spine and with PA mobs. Movement is improved, not painful, and she is improving with ability for ther ex. Plan to progress strength as tolerated.    Personal Factors and Comorbidities  Comorbidity 1    Examination-Activity Limitations  Locomotion Level;Reach Overhead;Sleep;Lift    Examination-Participation Restrictions  Cleaning;Driving    Stability/Clinical Decision Making  Stable/Uncomplicated    Rehab Potential  Good    PT Frequency  2x / week    PT Duration  6 weeks    PT Treatment/Interventions  ADLs/Self Care Home Management;Electrical Stimulation;Cryotherapy;Ultrasound;Moist Heat;Traction;Iontophoresis 4mg /ml Dexamethasone;Stair training;Functional mobility training;Therapeutic activities;Therapeutic exercise;Balance training;Neuromuscular re-education;Manual techniques;Patient/family education;Passive range of motion;Dry needling;Taping;Spinal Manipulations;Joint Manipulations    PT Next Visit Plan  review/progress core. continue upper back strengthening, manual/DN PRN    Consulted and Agree with Plan of Care  Patient       Patient will benefit from skilled therapeutic intervention in order to improve the following deficits and impairments:  Decreased range of motion, Decreased endurance, Increased muscle spasms, Pain, Decreased activity tolerance, Improper body mechanics, Decreased strength  Visit Diagnosis: 1. Pain in thoracic spine        Problem List Patient  Active Problem List   Diagnosis Date Noted  . 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 Change in stool 11/14/2018  . Posterior vitreous detachment  of right eye 09/10/2018  . History of IBS 01/30/2018  . Urolithiasis 06/23/2017  . Fibromyalgia 06/04/2017  . Chronic fatigue 06/04/2017  . Medication intolerance 06/04/2017  . ANA positive 05/20/2017  . 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 Serum calcium elevated 05/20/2017  . Interstitial cystitis 05/20/2017  . Dyspepsia 05/20/2017  . Mixed hyperlipidemia 04/06/2017  . Elevated BP without diagnosis of hypertension 04/06/2017  . Anxiety 04/06/2017  . Dry eyes 04/06/2017    Lyndee Hensen, PT, DPT 1:33 PM  06/13/19 ]  [place], [place], [address] Phone: [phone]   Fax:  [phone]  Name: Natalie Erickson MRN: [id number] Date of Birth: Sep 06, 1951

## 2019-06-14 ENCOUNTER — Telehealth: Payer: Self-pay | Admitting: Internal Medicine

## 2019-06-14 ENCOUNTER — Encounter (HOSPITAL_COMMUNITY): Payer: Self-pay | Admitting: Medical

## 2019-06-14 DIAGNOSIS — R002 Palpitations: Secondary | ICD-10-CM | POA: Diagnosis not present

## 2019-06-17 ENCOUNTER — Other Ambulatory Visit: Payer: Self-pay

## 2019-06-17 ENCOUNTER — Telehealth: Payer: Self-pay | Admitting: Internal Medicine

## 2019-06-17 ENCOUNTER — Encounter: Payer: Self-pay | Admitting: Physical Therapy

## 2019-06-17 ENCOUNTER — Ambulatory Visit (INDEPENDENT_AMBULATORY_CARE_PROVIDER_SITE_OTHER): Payer: PPO | Admitting: Physical Therapy

## 2019-06-17 DIAGNOSIS — M546 Pain in thoracic spine: Secondary | ICD-10-CM | POA: Diagnosis not present

## 2019-06-17 NOTE — Telephone Encounter (Signed)
called x3 for pre reg, sent message to my chart & email  °

## 2019-06-17 NOTE — Therapy (Signed)
[facility] [address] [place], [place], [address] Phone: [phone]   Fax:  [phone]  Physical Therapy Treatment  Patient Details  Name: Natalie Erickson MRN: [id number] Date of Birth: [birth year] Referring Provider (PT): Dr. Juleen China   Encounter Date: 06/17/2019  PT End of Session - 06/17/19 0905    Visit Number  7    Number of Visits  12    Date for PT Re-Evaluation  07/09/19    Authorization Type  HTA    PT Start Time  0900    PT Stop Time  0946    PT Time Calculation (min)  46 min    Activity Tolerance  Patient tolerated treatment well    Behavior During Therapy  [facility] for tasks assessed/performed       Past Medical History:  Diagnosis Date  . Anxiety 04/06/2017  . Cystitis   . Depression   . Dry eyes 04/06/2017  . Elevated BP without diagnosis of hypertension 04/06/2017   lost weight >> BP improved  . Fibromyalgia   . Heart murmur    hx of 2 Echocardiograms in [place] (pt was told they were normal)  . Hiatal hernia   . History of colon polyps   . History of echocardiogram    Echo 9/19: mild LVH, EF 60-65, no RWMA, Gr 1 DD, trivial MR/TR, PASP 16  . History of Lyme disease   . IBS (irritable bowel syndrome)   . Mixed hyperlipidemia 04/06/2017   intol to statins due to myalgias; never tried Zetia  . TMJ (dislocation of temporomandibular joint)   . Vasomotor rhinitis     Past Surgical History:  Procedure Laterality Date  . APPENDECTOMY  1959  . LAPAROSCOPY    . LEFT OOPHORECTOMY      There were no vitals filed for this visit.  Subjective Assessment - 06/17/19 0858    Subjective  Pt states decreased pain in back. She did have neck soreness after last visit.    Patient Stated Goals  Decreased pain , increased strength    Currently in Pain?  No/denies    Pain Score  1     Pain Location  Back    Pain Orientation  Upper    Pain Descriptors / Indicators  Tightness    Pain Type  Acute pain    Pain Onset  More  than a month ago                       [facility] Adult PT Treatment/Exercise - 06/17/19 0912      Lumbar Exercises: Stretches   Other Lumbar Stretch Exercise  Supine pec stretch x1 min with nerve glide;   low and mid door way stretch 2x30 sec : Supine ER butterfly stretch x15;     Other Lumbar Stretch Exercise  Standing, post shoulder stretch 30 sec x2 bil;       Lumbar Exercises: Standing   Row  20 reps    Theraband Level (Row)  Level 3 (Green)    Other Standing Lumbar Exercises  Overhead press(angel) x10 shoulder AROM    Other Standing Lumbar Exercises  Wall push ups x15;       Lumbar Exercises: Seated   Other Seated Lumbar Exercises  Thoracic rotation, arms at 90 with unweighted ball x10;       Lumbar Exercises: Supine   Clam  --    Clam Limitations  --    Dead Bug  15  reps    Bridge  15 reps    Single Leg Bridge  --    Straight Leg Raise  10 reps    Other Supine Lumbar Exercises  Supine UE flys 2lb x15 bil; Supine UE flexion 2lb x15;       Manual Therapy   Manual Therapy  Soft tissue mobilization;Joint mobilization;Passive ROM    Joint Mobilization  Thoracic PA mobs gr 3, Subscap release on L;     Soft tissue mobilization  STM R rhomboid region with tennis ball;     Passive ROM  Manual UT stretch;                 PT Short Term Goals - 06/11/19 1148      PT SHORT TERM GOAL #1   Title  Pt to be independent with initial HEP    Time  2    Period  Weeks    Status  Achieved    Target Date  06/11/19        PT Long Term Goals - 05/28/19 1043      PT LONG TERM GOAL #1   Title  Pt to be independent with long term HEP     Time  6    Period  Weeks    Status  New    Target Date  07/09/19      PT LONG TERM GOAL #2   Title  Pt to report decreased pain to 0-2/10 with activity    Time  6    Period  Weeks    Status  New    Target Date  07/09/19      PT LONG TERM GOAL #3   Title  Pt to demo increased core strength, to be [facility], with minimal postural  changes with stabilization exercises, to improve stability and pain    Time  6    Period  Weeks    Status  New    Target Date  07/09/19      PT LONG TERM GOAL #4   Title  Pt to demo ability to lift/carry at least 10 lb with proper mechanics and no pain in back, to improve safety with functional activitiy  .    Time  6    Period  Weeks    Status  New    Target Date  07/09/19            Plan - 06/17/19 1118    Clinical Impression Statement  Pt with improving pain in thoracic region with movement and with palaption. She states mild pain at low thoracic region, which is likely from over extension of thoracic spine with UE elevation. Education and practice for keeping TA contraction with UE strengthening done today. Pt progressing well, will benefit from continued care for education and strengthening for postural and core muscles for decreased pain with movement .    Personal Factors and Comorbidities  Comorbidity 1    Examination-Activity Limitations  Locomotion Level;Reach Overhead;Sleep;Lift    Examination-Participation Restrictions  Cleaning;Driving    Stability/Clinical Decision Making  Stable/Uncomplicated    Rehab Potential  Good    PT Frequency  2x / week    PT Duration  6 weeks    PT Treatment/Interventions  ADLs/Self Care Home Management;Electrical Stimulation;Cryotherapy;Ultrasound;Moist Heat;Traction;Iontophoresis 4mg /ml Dexamethasone;Stair training;Functional mobility training;Therapeutic activities;Therapeutic exercise;Balance training;Neuromuscular re-education;Manual techniques;Patient/family education;Passive range of motion;Dry needling;Taping;Spinal Manipulations;Joint Manipulations    PT Next Visit Plan  review/progress core. continue upper back strengthening, manual/DN PRN  Consulted and Agree with Plan of Care  Patient       Patient will benefit from skilled therapeutic intervention in order to improve the following deficits and impairments:  Decreased range of  motion, Decreased endurance, Increased muscle spasms, Pain, Decreased activity tolerance, Improper body mechanics, Decreased strength  Visit Diagnosis: 1. Pain in thoracic spine        Problem List Patient Active Problem List   Diagnosis Date Noted  . 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 Change in stool 11/14/2018  . Posterior vitreous detachment of right eye 09/10/2018  . History of IBS 01/30/2018  . Urolithiasis 06/23/2017  . Fibromyalgia 06/04/2017  . Chronic fatigue 06/04/2017  . Medication intolerance 06/04/2017  . ANA positive 05/20/2017  . 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 Serum calcium elevated 05/20/2017  . Interstitial cystitis 05/20/2017  . Dyspepsia 05/20/2017  . Mixed hyperlipidemia 04/06/2017  . Elevated BP without diagnosis of hypertension 04/06/2017  . Anxiety 04/06/2017  . Dry eyes 04/06/2017    Lyndee Hensen, PT, DPT 11:21 AM  06/17/19    [facility] [place], [place], [address] Phone: [phone]   Fax:  [phone]  Name: Natalie Erickson MRN: [id number] Date of Birth: [birth year]

## 2019-06-18 ENCOUNTER — Other Ambulatory Visit: Payer: Self-pay | Admitting: Medical

## 2019-06-18 DIAGNOSIS — Z77018 Contact with and (suspected) exposure to other hazardous metals: Secondary | ICD-10-CM | POA: Diagnosis not present

## 2019-06-18 DIAGNOSIS — W57XXXS Bitten or stung by nonvenomous insect and other nonvenomous arthropods, sequela: Secondary | ICD-10-CM | POA: Diagnosis not present

## 2019-06-18 DIAGNOSIS — K581 Irritable bowel syndrome with constipation: Secondary | ICD-10-CM | POA: Diagnosis not present

## 2019-06-18 DIAGNOSIS — Z77010 Contact with and (suspected) exposure to arsenic: Secondary | ICD-10-CM | POA: Diagnosis not present

## 2019-06-18 DIAGNOSIS — M797 Fibromyalgia: Secondary | ICD-10-CM | POA: Diagnosis not present

## 2019-06-18 DIAGNOSIS — R002 Palpitations: Secondary | ICD-10-CM

## 2019-06-19 ENCOUNTER — Encounter: Payer: PPO | Admitting: Physical Therapy

## 2019-06-19 NOTE — Telephone Encounter (Signed)
I left a voice message reminding pt of her appointment on 06-20-19 with Dr [name].

## 2019-06-20 ENCOUNTER — Other Ambulatory Visit: Payer: Self-pay

## 2019-06-20 ENCOUNTER — Telehealth (INDEPENDENT_AMBULATORY_CARE_PROVIDER_SITE_OTHER): Payer: PPO | Admitting: Internal Medicine

## 2019-06-20 ENCOUNTER — Encounter: Payer: Self-pay | Admitting: Physical Therapy

## 2019-06-20 ENCOUNTER — Ambulatory Visit (INDEPENDENT_AMBULATORY_CARE_PROVIDER_SITE_OTHER): Payer: PPO | Admitting: Physical Therapy

## 2019-06-20 ENCOUNTER — Telehealth: Payer: Self-pay | Admitting: *Deleted

## 2019-06-20 ENCOUNTER — Encounter: Payer: Self-pay | Admitting: Internal Medicine

## 2019-06-20 VITALS — BP 118/75 | HR 80 | Ht 62.0 in | Wt 133.0 lb

## 2019-06-20 DIAGNOSIS — E785 Hyperlipidemia, unspecified: Secondary | ICD-10-CM

## 2019-06-20 DIAGNOSIS — M797 Fibromyalgia: Secondary | ICD-10-CM | POA: Diagnosis not present

## 2019-06-20 DIAGNOSIS — M546 Pain in thoracic spine: Secondary | ICD-10-CM

## 2019-06-20 DIAGNOSIS — R002 Palpitations: Secondary | ICD-10-CM

## 2019-06-20 DIAGNOSIS — F419 Anxiety disorder, unspecified: Secondary | ICD-10-CM | POA: Diagnosis not present

## 2019-06-20 DIAGNOSIS — R0789 Other chest pain: Secondary | ICD-10-CM

## 2019-06-20 NOTE — Patient Instructions (Addendum)
Medication Instructions:  NO CHANGES   If you need a refill on your cardiac medications before your next appointment, please call your pharmacy.   Lab work:  NOT NEEDED    Testing/Procedures: She had stress test ordered by Tommye Standard, PA.-- Please call to scheduled when you  are ready.   Follow-Up: At [facility], you and your health needs are our priority.  As part of our continuing mission to provide you with exceptional heart care, we have created designated Provider Care Teams.  These Care Teams include your primary Cardiologist (physician) and Advanced Practice Providers (APPs -  Physician Assistants and Nurse Practitioners) who all work together to provide you with the care you need, when you need it. . You will need a follow up appointment in 6 Months [place] 2020.  Please call our office 2 months in advance to schedule this appointment.  You may see [name], MD*or one of the following Advanced Practice Providers on your designated Care Team:   . Rosaria Ferries, PA-C . Jory Sims, DNP, ANP  Any Other Special Instructions Will Be Listed Below (If Applicable).

## 2019-06-20 NOTE — Therapy (Signed)
[facility] [address] [place], [place], [address] Phone: [phone]   Fax:  [phone]  Physical Therapy Treatment  Patient Details  Name: Natalie Erickson MRN: [id number] Date of Birth: 1951/07/06 Referring Provider (PT): Dr. Juleen China   Encounter Date: 06/20/2019  PT End of Session - 06/20/19 1153    Visit Number  8    Number of Visits  12    Date for PT Re-Evaluation  07/09/19    Authorization Type  HTA    PT Start Time  1100    PT Stop Time  1146    PT Time Calculation (min)  46 min    Activity Tolerance  Patient tolerated treatment well    Behavior During Therapy  [facility] for tasks assessed/performed       Past Medical History:  Diagnosis Date  . Anxiety 04/06/2017  . Cystitis   . Depression   . Dry eyes 04/06/2017  . Elevated BP without diagnosis of hypertension 04/06/2017   lost weight >> BP improved  . Fibromyalgia   . Heart murmur    hx of 2 Echocardiograms in [place] (pt was told they were normal)  . Hiatal hernia   . History of colon polyps   . History of echocardiogram    Echo 9/19: mild LVH, EF 60-65, no RWMA, Gr 1 DD, trivial MR/TR, PASP 16  . History of Lyme disease   . IBS (irritable bowel syndrome)   . Mixed hyperlipidemia 04/06/2017   intol to statins due to myalgias; never tried Zetia  . TMJ (dislocation of temporomandibular joint)   . Vasomotor rhinitis     Past Surgical History:  Procedure Laterality Date  . APPENDECTOMY  1959  . LAPAROSCOPY    . LEFT OOPHORECTOMY      There were no vitals filed for this visit.  Subjective Assessment - 06/20/19 1153    Subjective  Pt states pain in L side of neck that shes had for a couple weeks. States thoracic pain is improving.    Patient Stated Goals  Decreased pain , increased strength    Currently in Pain?  Yes    Pain Score  2     Pain Location  Back    Pain Orientation  Upper    Pain Descriptors / Indicators  Aching    Pain Type  Acute pain    Pain Onset  More than a month ago    Pain Frequency  Intermittent                       [facility] Adult PT Treatment/Exercise - 06/20/19 1104      Lumbar Exercises: Stretches   Other Lumbar Stretch Exercise  Supine pec stretch x1 min with nerve glide;  mid door way stretch 2x30 sec :     Other Lumbar Stretch Exercise  --      Lumbar Exercises: Standing   Row  20 reps    Theraband Level (Row)  Level 3 (Green)    Other Standing Lumbar Exercises  Overhead press(angel), abd, and scaption, all x10 shoulder AROM with education on posture    Other Standing Lumbar Exercises  Wall push ups x20       Lumbar Exercises: Seated   Other Seated Lumbar Exercises  --      Lumbar Exercises: Supine   Dead Bug  20 reps    Bridge  --    Straight Leg Raise  20 reps  Other Supine Lumbar Exercises  Supine UE flys 2lb x15 bil; Supine UE flexion 2lb x15;       Manual Therapy   Manual Therapy  Soft tissue mobilization;Joint mobilization;Passive ROM    Joint Mobilization  cervical mulligan mobs to increase L rotation    Soft tissue mobilization  STM and IASTM to cervical parapsinals and sub occipitlas, and L UT    Passive ROM  --               PT Short Term Goals - 06/11/19 1148      PT SHORT TERM GOAL #1   Title  Pt to be independent with initial HEP    Time  2    Period  Weeks    Status  Achieved    Target Date  06/11/19        PT Long Term Goals - 05/28/19 1043      PT LONG TERM GOAL #1   Title  Pt to be independent with long term HEP     Time  6    Period  Weeks    Status  New    Target Date  07/09/19      PT LONG TERM GOAL #2   Title  Pt to report decreased pain to 0-2/10 with activity    Time  6    Period  Weeks    Status  New    Target Date  07/09/19      PT LONG TERM GOAL #3   Title  Pt to demo increased core strength, to be [facility], with minimal postural changes with stabilization exercises, to improve stability and pain    Time  6    Period  Weeks     Status  New    Target Date  07/09/19      PT LONG TERM GOAL #4   Title  Pt to demo ability to lift/carry at least 10 lb with proper mechanics and no pain in back, to improve safety with functional activitiy  .    Time  6    Period  Weeks    Status  New    Target Date  07/09/19            Plan - 06/20/19 1154    Clinical Impression Statement  Pt with increased pain in L side of cervical region today, addressed with manual therapy. Thoracic pain improving. Pt continues to require mod-max cuing to perfom exercises correctly. She has much difficulty with UE elevation without getting compensation from neck and UT muscles, Likely due to her posture and spine hypomobility. Plan to progress toward d/c, pt wil require further education on HEP and mechanics.    Personal Factors and Comorbidities  Comorbidity 1    Examination-Activity Limitations  Locomotion Level;Reach Overhead;Sleep;Lift    Examination-Participation Restrictions  Cleaning;Driving    Stability/Clinical Decision Making  Stable/Uncomplicated    Rehab Potential  Good    PT Frequency  2x / week    PT Duration  6 weeks    PT Treatment/Interventions  ADLs/Self Care Home Management;Electrical Stimulation;Cryotherapy;Ultrasound;Moist Heat;Traction;Iontophoresis 4mg /ml Dexamethasone;Stair training;Functional mobility training;Therapeutic activities;Therapeutic exercise;Balance training;Neuromuscular re-education;Manual techniques;Patient/family education;Passive range of motion;Dry needling;Taping;Spinal Manipulations;Joint Manipulations    PT Next Visit Plan  review/progress core. continue upper back strengthening, manual/DN PRN    Consulted and Agree with Plan of Care  Patient       Patient will benefit from skilled therapeutic intervention in order to improve the following deficits and impairments:  Decreased range of motion, Decreased endurance, Increased muscle spasms, Pain, Decreased activity tolerance, Improper body mechanics,  Decreased strength  Visit Diagnosis: 1. Pain in thoracic spine        Problem List Patient Active Problem List   Diagnosis Date Noted  . 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 Change in stool 11/14/2018  . Posterior vitreous detachment of right eye 09/10/2018  . History of IBS 01/30/2018  . Urolithiasis 06/23/2017  . Fibromyalgia 06/04/2017  . Chronic fatigue 06/04/2017  . Medication intolerance 06/04/2017  . ANA positive 05/20/2017  . 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 Serum calcium elevated 05/20/2017  . Interstitial cystitis 05/20/2017  . Dyspepsia 05/20/2017  . Mixed hyperlipidemia 04/06/2017  . Elevated BP without diagnosis of hypertension 04/06/2017  . Anxiety 04/06/2017  . Dry eyes 04/06/2017   Lyndee Hensen, PT, DPT 11:57 AM  06/20/19    [facility] [place], [place], [address] Phone: [phone]   Fax:  [phone]  Name: Julius Boniface MRN: [id number] Date of Birth: August 08, 1951

## 2019-06-20 NOTE — Telephone Encounter (Signed)
Left detailed message  -instruction from 06/20/19 tele-visit. avs summary will be sent via mychart.

## 2019-06-20 NOTE — Progress Notes (Signed)
Virtual Visit via Video Note   This visit type was conducted due to national recommendations for restrictions regarding the COVID-19 Pandemic (e.g. social distancing) in an effort to limit this patient's exposure and mitigate transmission in our community.  Due to her co-morbid illnesses, this patient is at least at moderate risk for complications without adequate follow up.  This format is felt to be most appropriate for this patient at this time.  All issues noted in this document were discussed and addressed.  A limited physical exam was performed with this format.  Please refer to the patient's chart for her consent to telehealth for [facility].   Date:  06/20/2019   ID:  Natalie Erickson, DOB 05/28/1951, MRN [id number]  Patient Location: Home Provider Location: Office  PCP:  Briscoe Deutscher, DO  Cardiologist:  [name], MD  Electrophysiologist:  None   Evaluation Performed:  Follow-Up Visit  Chief Complaint:  F/u chest tightness and palpitations  History of Present Illness:    Natalie Erickson is a 68 y.o. female with anxiety, IBS, depression, fibromyalgia, hyperlipidemia who presents today for evaluation of chest tightness and palpitations.   She has previously seen my colleagues in cardiology for palpitations, and has had a Holter monitor as well as a an extended cardiac monitor just prior to this visit. Holter had shown rare ectopy. Extended monitor showed two episodes of SVT 10 beats or less. We discussed this finding today. She does not feel that per palpitations are particularly bothersome currently, however she did notice an increase in symptoms correlating with a back injury 5-6 weeks ago. She is participating in therapy. She has previously taken beta blockers, but did not tolerate propranolol or metoprolol, both of which gave her a sense of chest tightness and shortness of breath.   She notes a sense of chest tightness when her back gets tight. She will notice it when she  performs twisting motions. She notes that her fibromyalgia and anxiety likely contribute greatly to her chest tightness. When she walks and exerts herself, this seems to help the chest pain.   She endorses snoring, and has not yet been evaluated for sleep apnea.   Denies a family history of early MI or SCD.   The patient does not have symptoms concerning for COVID-19 infection (fever, chills, cough, or new shortness of breath).    Past Medical History:  Diagnosis Date  . Anxiety 04/06/2017  . Cystitis   . Depression   . Dry eyes 04/06/2017  . Elevated BP without diagnosis of hypertension 04/06/2017   lost weight >> BP improved  . Fibromyalgia   . Heart murmur    hx of 2 Echocardiograms in [place] (pt was told they were normal)  . Hiatal hernia   . History of colon polyps   . History of echocardiogram    Echo 9/19: mild LVH, EF 60-65, no RWMA, Gr 1 DD, trivial MR/TR, PASP 16  . History of Lyme disease   . IBS (irritable bowel syndrome)   . Mixed hyperlipidemia 04/06/2017   intol to statins due to myalgias; never tried Zetia  . TMJ (dislocation of temporomandibular joint)   . Vasomotor rhinitis    Past Surgical History:  Procedure Laterality Date  . APPENDECTOMY  1959  . LAPAROSCOPY    . LEFT OOPHORECTOMY       No outpatient medications have been marked as taking for the 06/20/19 encounter (Telemedicine) with [name], MD.     Allergies:  Statins; Banana; Chocolate; Cocoa; Codeine; Cranberry; Latex; Monosodium glutamate; Penicillins; Antihistamines, loratadine-type; Carvedilol; Eggs or egg-derived products; Ginger; Gluten meal; Lexapro [escitalopram]; Montelukast; Other; Prednisone; Sulfites; Vanilla; Whey; Zofran [ondansetron hcl]; and Azithromycin   Social History   Tobacco Use  . Smoking status: Never Smoker  . Smokeless tobacco: Never Used  Substance Use Topics  . Alcohol use: No  . Drug use: No     Family Hx: The patient's family history includes  Breast cancer in her sister; Diabetes in her paternal grandmother; Hypertension in her mother; Lung cancer in her father. There is no history of Colon cancer, Stomach cancer, Heart attack, or Heart failure.  ROS:   Please see the history of present illness.     All other systems reviewed and are negative.   Prior CV studies:   The following studies were reviewed today:    Labs/Other Tests and Data Reviewed:    EKG:  An ECG dated 05/27/2019 was personally reviewed today and demonstrated:  NSR  Recent Labs: 06/04/2019: ALT 10; BUN 21; Creatinine, Ser 0.73; Hemoglobin 13.7; Platelets 424.0; Potassium 4.1; Sodium 139; TSH 0.64   Recent Lipid Panel Lab Results  Component Value Date/Time   CHOL 224 (H) 06/04/2019 09:50 AM   TRIG 96.0 06/04/2019 09:50 AM   HDL 66.90 06/04/2019 09:50 AM   CHOLHDL 3 06/04/2019 09:50 AM   LDLCALC 138 (H) 06/04/2019 09:50 AM    Wt Readings from Last 3 Encounters:  06/20/19 133 lb (60.3 kg)  06/05/19 135 lb (61.2 kg)  05/31/19 135 lb (61.2 kg)     Objective:    Vital Signs:  BP 118/75   Pulse 80   Ht 5\' 2"  (1.575 m)   Wt 133 lb (60.3 kg)   BMI 24.33 kg/m    VITAL SIGNS:  reviewed GEN:  no acute distress EYES:  sclerae anicteric, EOMI - Extraocular Movements Intact RESPIRATORY:  normal respiratory effort, symmetric expansion CARDIOVASCULAR:  no peripheral edema SKIN:  no rash, lesions or ulcers. MUSCULOSKELETAL:  no obvious deformities. NEURO:  alert and oriented x 3, no obvious focal deficit PSYCH:  normal affect  ASSESSMENT & PLAN:    1. Palpitations   2. Hyperlipidemia, unspecified hyperlipidemia type   3. Fibromyalgia   4. Acute bilateral thoracic back pain   5. Anxiety   6. Chest tightness    Chest tightness - stress test has been ordered by my colleague K. Kroeger PA, patient and I have discussed and she will complete this test to ensure no ischemia contributing to symptoms.   Palpitations - will observe for now. Patient has  stopped all medications to ensure no contributing roll.   HLD - LDL elevated, should discuss statin therapy. Patient will attempt diet and lifestyle modifcation first.   COVID-19 Education: The signs and symptoms of COVID-19 were discussed with the patient and how to seek care for testing (follow up with PCP or arrange E-visit).  The importance of social distancing was discussed today.  Time:   Today, I have spent 25 minutes with the patient with telehealth technology discussing the above problems.     Medication Adjustments/Labs and Tests Ordered: Current medicines are reviewed at length with the patient today.  Concerns regarding medicines are outlined above.   Tests Ordered: No orders of the defined types were placed in this encounter.   Medication Changes: No orders of the defined types were placed in this encounter.   Follow Up:  Virtual Visit or In Person in 6 month(s)  Signed, [name], MD  06/20/2019 10:12 PM    [place]

## 2019-06-24 ENCOUNTER — Telehealth: Payer: Self-pay | Admitting: Internal Medicine

## 2019-06-24 ENCOUNTER — Other Ambulatory Visit: Payer: Self-pay

## 2019-06-24 DIAGNOSIS — R002 Palpitations: Secondary | ICD-10-CM

## 2019-06-24 NOTE — Telephone Encounter (Signed)
New message:    Patient states that she received a letter in the mail about a appt for test. I did not see a order. Please call patient back.

## 2019-06-24 NOTE — Progress Notes (Signed)
Incoming call from scheduling requesting that order for myocardial perfusion imaging study be reordered. New order in epic

## 2019-06-24 NOTE — Telephone Encounter (Signed)
[email or web address]

## 2019-06-25 ENCOUNTER — Encounter: Payer: Self-pay | Admitting: Physical Therapy

## 2019-06-25 ENCOUNTER — Other Ambulatory Visit: Payer: Self-pay

## 2019-06-25 ENCOUNTER — Ambulatory Visit (INDEPENDENT_AMBULATORY_CARE_PROVIDER_SITE_OTHER): Payer: PPO | Admitting: Physical Therapy

## 2019-06-25 DIAGNOSIS — M546 Pain in thoracic spine: Secondary | ICD-10-CM

## 2019-06-25 NOTE — Therapy (Signed)
[facility] [address] [place], [place], [address] Phone: [phone]   Fax:  [phone]  Physical Therapy Treatment  Patient Details  Name: Natalie Erickson MRN: [id number] Date of Birth: [birth year] Referring Provider (PT): Dr. Juleen China   Encounter Date: 06/25/2019  PT End of Session - 06/25/19 0958    Visit Number  9    Number of Visits  12    Date for PT Re-Evaluation  07/09/19    Authorization Type  HTA    PT Start Time  0903    PT Stop Time  0950    PT Time Calculation (min)  47 min    Activity Tolerance  Patient tolerated treatment well    Behavior During Therapy  [facility] for tasks assessed/performed       Past Medical History:  Diagnosis Date  . Anxiety 04/06/2017  . Cystitis   . Depression   . Dry eyes 04/06/2017  . Elevated BP without diagnosis of hypertension 04/06/2017   lost weight >> BP improved  . Fibromyalgia   . Heart murmur    hx of 2 Echocardiograms in [place] (pt was told they were normal)  . Hiatal hernia   . History of colon polyps   . History of echocardiogram    Echo 9/19: mild LVH, EF 60-65, no RWMA, Gr 1 DD, trivial MR/TR, PASP 16  . History of Lyme disease   . IBS (irritable bowel syndrome)   . Mixed hyperlipidemia 04/06/2017   intol to statins due to myalgias; never tried Zetia  . TMJ (dislocation of temporomandibular joint)   . Vasomotor rhinitis     Past Surgical History:  Procedure Laterality Date  . APPENDECTOMY  1959  . LAPAROSCOPY    . LEFT OOPHORECTOMY      There were no vitals filed for this visit.  Subjective Assessment - 06/25/19 0957    Subjective  Pt states thoracic pain improving, is sore with increased activity. Neck is still sore from massage she had last week.    Patient Stated Goals  Decreased pain , increased strength    Currently in Pain?  Yes    Pain Score  2     Pain Location  Back    Pain Orientation  Upper    Pain Descriptors / Indicators  Aching;Tightness    Pain Type  Acute pain    Pain Onset  More than a month ago    Pain Frequency  Intermittent    Aggravating Factors   deep breaths, increased UE activity.                       [facility] Adult PT Treatment/Exercise - 06/25/19 0906      Lumbar Exercises: Stretches   Other Lumbar Stretch Exercise  mid door way stretch 2x30 sec :       Lumbar Exercises: Standing   Row  20 reps    Theraband Level (Row)  Level 3 (Green)    Other Standing Lumbar Exercises  Overhead press(angel), abd, and scaption, all x10 shoulder AROM with education on posture    Other Standing Lumbar Exercises  Wall push ups x20       Lumbar Exercises: Seated   Other Seated Lumbar Exercises  Thoracic rotation,x10      Lumbar Exercises: Supine   Ab Set  20 reps    Dead Bug  20 reps    Straight Leg Raise  20 reps    Other Supine  Lumbar Exercises  Supine UE flys 2lb x15 bil; Supine UE flexion 2lb x15;       Manual Therapy   Manual Therapy  Soft tissue mobilization;Joint mobilization;Passive ROM    Joint Mobilization  thoracic PA mobs    Soft tissue mobilization  STM and IASTM to thoracic parapsinals              PT Education - 06/25/19 0958    Education Details  HEP reviewed, Discussed posture at length    Person(s) Educated  Patient    Methods  Explanation;Demonstration;Tactile cues;Verbal cues    Comprehension  Verbalized understanding       PT Short Term Goals - 06/11/19 1148      PT SHORT TERM GOAL #1   Title  Pt to be independent with initial HEP    Time  2    Period  Weeks    Status  Achieved    Target Date  06/11/19        PT Long Term Goals - 05/28/19 1043      PT LONG TERM GOAL #1   Title  Pt to be independent with long term HEP     Time  6    Period  Weeks    Status  New    Target Date  07/09/19      PT LONG TERM GOAL #2   Title  Pt to report decreased pain to 0-2/10 with activity    Time  6    Period  Weeks    Status  New    Target Date  07/09/19      PT LONG  TERM GOAL #3   Title  Pt to demo increased core strength, to be [facility], with minimal postural changes with stabilization exercises, to improve stability and pain    Time  6    Period  Weeks    Status  New    Target Date  07/09/19      PT LONG TERM GOAL #4   Title  Pt to demo ability to lift/carry at least 10 lb with proper mechanics and no pain in back, to improve safety with functional activitiy  .    Time  6    Period  Weeks    Status  New    Target Date  07/09/19            Plan - 06/25/19 1000    Clinical Impression Statement  Pt with mild tightness of thoracic paraspinals and rhomboid, pain much improved. Thoracic mobility also improved. Pt education at length today, on TA contraction, for improving compensatory thoracic extension she is getting with most arm movement. Pt with difficulty holding TA contraction and learning chest breathing. Plan to d/c in next 1-2 weeks.    Personal Factors and Comorbidities  Comorbidity 1    Examination-Activity Limitations  Locomotion Level;Reach Overhead;Sleep;Lift    Examination-Participation Restrictions  Cleaning;Driving    Stability/Clinical Decision Making  Stable/Uncomplicated    Rehab Potential  Good    PT Frequency  2x / week    PT Duration  6 weeks    PT Treatment/Interventions  ADLs/Self Care Home Management;Electrical Stimulation;Cryotherapy;Ultrasound;Moist Heat;Traction;Iontophoresis 4mg /ml Dexamethasone;Stair training;Functional mobility training;Therapeutic activities;Therapeutic exercise;Balance training;Neuromuscular re-education;Manual techniques;Patient/family education;Passive range of motion;Dry needling;Taping;Spinal Manipulations;Joint Manipulations    PT Next Visit Plan  review/progress core. continue upper back strengthening, manual/DN PRN    Consulted and Agree with Plan of Care  Patient       Patient will benefit  from skilled therapeutic intervention in order to improve the following deficits and impairments:   Decreased range of motion, Decreased endurance, Increased muscle spasms, Pain, Decreased activity tolerance, Improper body mechanics, Decreased strength  Visit Diagnosis: 1. Pain in thoracic spine        Problem List Patient Active Problem List   Diagnosis Date Noted  . 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 Change in stool 11/14/2018  . Posterior vitreous detachment of right eye 09/10/2018  . History of IBS 01/30/2018  . Urolithiasis 06/23/2017  . Fibromyalgia 06/04/2017  . Chronic fatigue 06/04/2017  . Medication intolerance 06/04/2017  . ANA positive 05/20/2017  . 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 Serum calcium elevated 05/20/2017  . Interstitial cystitis 05/20/2017  . Dyspepsia 05/20/2017  . Mixed hyperlipidemia 04/06/2017  . Elevated BP without diagnosis of hypertension 04/06/2017  . Anxiety 04/06/2017  . Dry eyes 04/06/2017    Lyndee Hensen, PT, DPT 10:02 AM  06/25/19    [facility] [place], [place], [address] Phone: [phone]   Fax:  [phone]  Name: Natalie Erickson MRN: [id number] Date of Birth: Jan 10, 1951

## 2019-06-25 NOTE — Telephone Encounter (Signed)
Returned call to pt, verified that she knew date of myoview verbalized understanding

## 2019-06-26 NOTE — Telephone Encounter (Signed)
Open n error °

## 2019-06-27 ENCOUNTER — Ambulatory Visit (INDEPENDENT_AMBULATORY_CARE_PROVIDER_SITE_OTHER): Payer: PPO | Admitting: Physical Therapy

## 2019-06-27 ENCOUNTER — Other Ambulatory Visit: Payer: Self-pay

## 2019-06-27 ENCOUNTER — Encounter: Payer: Self-pay | Admitting: Physical Therapy

## 2019-06-27 DIAGNOSIS — M546 Pain in thoracic spine: Secondary | ICD-10-CM

## 2019-06-27 NOTE — Therapy (Signed)
[facility] [address] [place], [place], [address] Phone: [phone]   Fax:  [phone]  Physical Therapy Treatment  Patient Details  Name: Natalie Erickson MRN: [id number] Date of Birth: 1951/01/17 Referring Provider (PT): Dr. Juleen China   Encounter Date: 06/27/2019  PT End of Session - 06/27/19 0959    Visit Number  10    Number of Visits  12    Date for PT Re-Evaluation  07/09/19    Authorization Type  HTA    PT Start Time  0905    PT Stop Time  0950    PT Time Calculation (min)  45 min    Activity Tolerance  Patient tolerated treatment well    Behavior During Therapy  [facility] for tasks assessed/performed       Past Medical History:  Diagnosis Date  . Anxiety 04/06/2017  . Cystitis   . Depression   . Dry eyes 04/06/2017  . Elevated BP without diagnosis of hypertension 04/06/2017   lost weight >> BP improved  . Fibromyalgia   . Heart murmur    hx of 2 Echocardiograms in [place] (pt was told they were normal)  . Hiatal hernia   . History of colon polyps   . History of echocardiogram    Echo 9/19: mild LVH, EF 60-65, no RWMA, Gr 1 DD, trivial MR/TR, PASP 16  . History of Lyme disease   . IBS (irritable bowel syndrome)   . Mixed hyperlipidemia 04/06/2017   intol to statins due to myalgias; never tried Zetia  . TMJ (dislocation of temporomandibular joint)   . Vasomotor rhinitis     Past Surgical History:  Procedure Laterality Date  . APPENDECTOMY  1959  . LAPAROSCOPY    . LEFT OOPHORECTOMY      There were no vitals filed for this visit.  Subjective Assessment - 06/27/19 0926    Subjective  Pt states pain in neck today, later states that pain in neck is getting better from earlier this week, but is still sore. She declines manual therapy for neck today.    Limitations  Sitting;Reading;Lifting;Writing;House hold activities    Patient Stated Goals  Decreased pain , increased strength    Currently in Pain?  Yes    Pain  Score  3     Pain Location  Back    Pain Orientation  Upper    Pain Descriptors / Indicators  Aching;Tightness    Pain Type  Acute pain    Pain Onset  More than a month ago    Pain Frequency  Intermittent                       [facility] Adult PT Treatment/Exercise - 06/27/19 0927      Lumbar Exercises: Stretches   Other Lumbar Stretch Exercise  mid door way stretch 2x30 sec :     Other Lumbar Stretch Exercise  upper thoracic and cervical stretch 30 sec x3;       Lumbar Exercises: Standing   Row  20 reps    Theraband Level (Row)  Level 3 (Green)    Other Standing Lumbar Exercises  ARom: shoulder abd, and scaption, all x10 shoulder AROM with education on posture    Other Standing Lumbar Exercises  STanding thoracic rotation with [place] chi exercise x2 min;       Lumbar Exercises: Seated   Other Seated Lumbar Exercises  --      Lumbar Exercises: Supine  Ab Set  --    Dead Bug  20 reps    Bridge  20 reps    Straight Leg Raise  --    Other Supine Lumbar Exercises  Supine UE flys 2lb x15 bil; Supine UE flexion 2lb x15;       Manual Therapy   Manual Therapy  Soft tissue mobilization;Joint mobilization;Passive ROM    Joint Mobilization  --    Soft tissue mobilization  --    Passive ROM  Manual stretching for sub occipitals, UTs, Manual assist with cervical retraction,               PT Education - 06/27/19 0959    Education Details  Issued TMJ exercises for jaw pain.    Person(s) Educated  Patient    Methods  Explanation;Handout    Comprehension  Verbalized understanding       PT Short Term Goals - 06/11/19 1148      PT SHORT TERM GOAL #1   Title  Pt to be independent with initial HEP    Time  2    Period  Weeks    Status  Achieved    Target Date  06/11/19        PT Long Term Goals - 05/28/19 1043      PT LONG TERM GOAL #1   Title  Pt to be independent with long term HEP     Time  6    Period  Weeks    Status  New    Target Date  07/09/19       PT LONG TERM GOAL #2   Title  Pt to report decreased pain to 0-2/10 with activity    Time  6    Period  Weeks    Status  New    Target Date  07/09/19      PT LONG TERM GOAL #3   Title  Pt to demo increased core strength, to be [facility], with minimal postural changes with stabilization exercises, to improve stability and pain    Time  6    Period  Weeks    Status  New    Target Date  07/09/19      PT LONG TERM GOAL #4   Title  Pt to demo ability to lift/carry at least 10 lb with proper mechanics and no pain in back, to improve safety with functional activitiy  .    Time  6    Period  Weeks    Status  New    Target Date  07/09/19            Plan - 06/27/19 1129    Clinical Impression Statement  Pt states bothersome pain in neck, but declines manual therapy for neck today, says that she doesnt want to be sore. Pain likely from tight cervical musculature and posture. Pt with several other pain locations identified durin each session, which she attributes to her fibromyalgia, (ie, today she states soreness in clavicle region when doing dead bug exercise for core) Pt with improved ability for UE strengthening and AROM, with less compensation from UTs today. Also improved core activation with ther ex today. Pt would benefit from dry needling to improve cervical tightness, but pt declines today. Pt to be seen for 1-2 more visits.    Personal Factors and Comorbidities  Comorbidity 1    Examination-Activity Limitations  Locomotion Level;Reach Overhead;Sleep;Lift    Examination-Participation Restrictions  Cleaning;Driving    Stability/Clinical Decision  Making  Stable/Uncomplicated    Rehab Potential  Good    PT Frequency  2x / week    PT Duration  6 weeks    PT Treatment/Interventions  ADLs/Self Care Home Management;Electrical Stimulation;Cryotherapy;Ultrasound;Moist Heat;Traction;Iontophoresis 4mg /ml Dexamethasone;Stair training;Functional mobility training;Therapeutic activities;Therapeutic  exercise;Balance training;Neuromuscular re-education;Manual techniques;Patient/family education;Passive range of motion;Dry needling;Taping;Spinal Manipulations;Joint Manipulations    PT Next Visit Plan  review/progress core. continue upper back strengthening, manual/DN PRN    Consulted and Agree with Plan of Care  Patient       Patient will benefit from skilled therapeutic intervention in order to improve the following deficits and impairments:  Decreased range of motion, Decreased endurance, Increased muscle spasms, Pain, Decreased activity tolerance, Improper body mechanics, Decreased strength  Visit Diagnosis: 1. Pain in thoracic spine        Problem List Patient Active Problem List   Diagnosis Date Noted  . 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 Change in stool 11/14/2018  . Posterior vitreous detachment of right eye 09/10/2018  . History of IBS 01/30/2018  . Urolithiasis 06/23/2017  . Fibromyalgia 06/04/2017  . Chronic fatigue 06/04/2017  . Medication intolerance 06/04/2017  . ANA positive 05/20/2017  . 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 Serum calcium elevated 05/20/2017  . Interstitial cystitis 05/20/2017  . Dyspepsia 05/20/2017  . Mixed hyperlipidemia 04/06/2017  . Elevated BP without diagnosis of hypertension 04/06/2017  . Anxiety 04/06/2017  . Dry eyes 04/06/2017    Lyndee Hensen, PT, DPT 11:34 AM  06/27/19    [facility] [place], [place], [address] Phone: [phone]   Fax:  [phone]  Name: Natalie Erickson MRN: [id number] Date of Birth: 06-24-1951

## 2019-07-02 ENCOUNTER — Encounter: Payer: Self-pay | Admitting: Physical Therapy

## 2019-07-02 ENCOUNTER — Other Ambulatory Visit: Payer: Self-pay

## 2019-07-02 ENCOUNTER — Ambulatory Visit (INDEPENDENT_AMBULATORY_CARE_PROVIDER_SITE_OTHER): Payer: PPO | Admitting: Physical Therapy

## 2019-07-02 DIAGNOSIS — M546 Pain in thoracic spine: Secondary | ICD-10-CM | POA: Diagnosis not present

## 2019-07-02 DIAGNOSIS — M542 Cervicalgia: Secondary | ICD-10-CM | POA: Diagnosis not present

## 2019-07-02 NOTE — Therapy (Signed)
[name] [address] [place], [place], [address] Phone: [phone]   Fax:  [phone]  Physical Therapy Treatment/Re-Cert  Patient Details  Name: Natalie Erickson MRN: [id number] Date of Birth: 01-Jul-1951 Referring Provider (PT): Dr. Juleen China   Encounter Date: 07/02/2019  PT End of Session - 07/02/19 1105    Visit Number  11    Number of Visits  20    Date for PT Re-Evaluation  07/30/19    Authorization Type  HTA    PT Start Time  1005    PT Stop Time  1050    PT Time Calculation (min)  45 min    Activity Tolerance  Patient tolerated treatment well    Behavior During Therapy  [facility] for tasks assessed/performed       Past Medical History:  Diagnosis Date  . Anxiety 04/06/2017  . Cystitis   . Depression   . Dry eyes 04/06/2017  . Elevated BP without diagnosis of hypertension 04/06/2017   lost weight >> BP improved  . Fibromyalgia   . Heart murmur    hx of 2 Echocardiograms in [place] (pt was told they were normal)  . Hiatal hernia   . History of colon polyps   . History of echocardiogram    Echo 9/19: mild LVH, EF 60-65, no RWMA, Gr 1 DD, trivial MR/TR, PASP 16  . History of Lyme disease   . IBS (irritable bowel syndrome)   . Mixed hyperlipidemia 04/06/2017   intol to statins due to myalgias; never tried Zetia  . TMJ (dislocation of temporomandibular joint)   . Vasomotor rhinitis     Past Surgical History:  Procedure Laterality Date  . APPENDECTOMY  1959  . LAPAROSCOPY    . LEFT OOPHORECTOMY      There were no vitals filed for this visit.  Subjective Assessment - 07/02/19 1103    Subjective  Pt states that thoracic pain is much better. She continues to complain of cervical pain and upper trap pain. She has been doing HEP. Pt with several questions about cause of neck pain. She has declined treatment of cervical region in last few appts, despite this being main pain area.    Patient Stated Goals  Decreased pain ,  increased strength    Currently in Pain?  Yes    Pain Score  4     Pain Location  Neck    Pain Orientation  Left    Pain Descriptors / Indicators  Aching;Tightness    Pain Type  Chronic pain    Pain Onset  1 to 4 weeks ago    Pain Frequency  Intermittent    Aggravating Factors   head movement,sleep    Pain Relieving Factors  stretching, rest,                       [facility] Adult PT Treatment/Exercise - 07/02/19 1009      Lumbar Exercises: Stretches   Other Lumbar Stretch Exercise  mid and high door way stretch 2x30 sec :     Other Lumbar Stretch Exercise  --      Lumbar Exercises: Standing   Row  20 reps    Row Limitations  low row    Other Standing Lumbar Exercises  AROM, overhead angel motion x15;  Standing chin tucks x20;       Lumbar Exercises: Supine   Dead Bug  --    Bridge  20 reps  Other Supine Lumbar Exercises  Supine UE flys 2RTB x15 bil; Supine UE flexion 2lb x15;       Manual Therapy   Manual Therapy  Soft tissue mobilization;Joint mobilization;Passive ROM    Manual therapy comments  skilled palpation and monitoring of soft tissue with dry needling.     Joint Mobilization  thoracic PA mobs    Soft tissue mobilization  STM for L UT.    Passive ROM  manual stretch for UTs       Trigger Point Dry Needling - 07/02/19 0001    Consent Given?  Yes    Education Handout Provided  Yes    Muscles Treated Head and Neck  Upper trapezius    Upper Trapezius Response  Palpable increased muscle length   L          PT Education - 07/02/19 1105    Education Details  Education on dry needling use, precautions, risks, and benefits.    Person(s) Educated  Patient    Methods  Explanation;Handout    Comprehension  Verbalized understanding       PT Short Term Goals - 07/02/19 1106      PT SHORT TERM GOAL #1   Title  Pt to be independent with initial HEP    Time  2    Period  Weeks    Status  Achieved    Target Date  06/11/19        PT Long Term  Goals - 07/02/19 1107      PT LONG TERM GOAL #1   Title  Pt to be independent with long term HEP     Time  6    Period  Weeks    Status  Partially Met    Target Date  07/30/19      PT LONG TERM GOAL #2   Title  Pt to report decreased pain to 0-2/10 with activity    Time  6    Period  Weeks    Status  Partially Met    Target Date  07/30/19      PT LONG TERM GOAL #3   Title  Pt to demo increased core strength, to be [facility], with minimal postural changes with stabilization exercises, to improve stability and pain    Time  6    Period  Weeks    Status  Partially Met    Target Date  07/30/19      PT LONG TERM GOAL #4   Title  Pt to demo ability to lift/carry at least 10 lb with proper mechanics and no pain in back, to improve safety with functional activitiy  .    Time  6    Period  Weeks    Status  Partially Met    Target Date  07/30/19      PT LONG TERM GOAL #5   Title  Pt to demo cervical ROM and soft tissue restrictions to be [facility], with pain no more than 2/10    Time  4    Period  Weeks    Status  New    Target Date  07/30/19            Plan - 07/02/19 1110    Clinical Impression Statement  Pt with thoracic pain mostly resolved as of today. She has improved movment of t-spine, improved strength, and improved postural awareness for thoracic and core region. She has also improved with ability for core stabilization with ther ex  and functional activity. She continues to complain of pain in cervical region. Pt agreable today to shifting focus for manual therapy for neck pain, she was previously apprehensive due to not wanting to increase pain. Pt to benefit from manual therapy to improve cervical ROM, and surrounding muscle tightness, as well as dry needling. Focus will be on this for next few visits, pt to benefit from continuation of skilled care. Expect d/c in 2-3 weeks    Personal Factors and Comorbidities  Comorbidity 1    Examination-Activity Limitations  Locomotion  Level;Reach Overhead;Sleep;Lift    Examination-Participation Restrictions  Cleaning;Driving    Stability/Clinical Decision Making  Stable/Uncomplicated    Rehab Potential  Good    PT Frequency  2x / week    PT Duration  4 weeks    PT Treatment/Interventions  ADLs/Self Care Home Management;Electrical Stimulation;Cryotherapy;Ultrasound;Moist Heat;Traction;Iontophoresis 36m/ml Dexamethasone;Stair training;Functional mobility training;Therapeutic activities;Therapeutic exercise;Balance training;Neuromuscular re-education;Manual techniques;Patient/family education;Passive range of motion;Dry needling;Taping;Spinal Manipulations;Joint Manipulations    Consulted and Agree with Plan of Care  Patient       Patient will benefit from skilled therapeutic intervention in order to improve the following deficits and impairments:  Decreased range of motion, Decreased endurance, Increased muscle spasms, Pain, Decreased activity tolerance, Improper body mechanics, Decreased strength  Visit Diagnosis: 1. Pain in thoracic spine   2. Cervicalgia        Problem List Patient Active Problem List   Diagnosis Date Noted  . 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 Change in stool 11/14/2018  . Posterior vitreous detachment of right eye 09/10/2018  . History of IBS 01/30/2018  . Urolithiasis 06/23/2017  . Fibromyalgia 06/04/2017  . Chronic fatigue 06/04/2017  . Medication intolerance 06/04/2017  . ANA positive 05/20/2017  . 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 Serum calcium elevated 05/20/2017  . Interstitial cystitis 05/20/2017  . Dyspepsia 05/20/2017  . Mixed hyperlipidemia 04/06/2017  . Elevated BP without diagnosis of hypertension 04/06/2017  . Anxiety 04/06/2017  . Dry eyes 04/06/2017    LLyndee Hensen PT, DPT 11:16 AM  07/02/19    [facility] [address]Phone: [phone]  Fax:  [phone] Name: CResha FilipponeMRN:  [id number] of Birth: 107/11/1951

## 2019-07-04 ENCOUNTER — Other Ambulatory Visit: Payer: Self-pay

## 2019-07-04 ENCOUNTER — Ambulatory Visit (INDEPENDENT_AMBULATORY_CARE_PROVIDER_SITE_OTHER): Payer: PPO | Admitting: Physical Therapy

## 2019-07-04 ENCOUNTER — Encounter: Payer: Self-pay | Admitting: Physical Therapy

## 2019-07-04 DIAGNOSIS — M546 Pain in thoracic spine: Secondary | ICD-10-CM

## 2019-07-04 DIAGNOSIS — M542 Cervicalgia: Secondary | ICD-10-CM

## 2019-07-04 NOTE — Therapy (Signed)
[facility] [address] [place], [place], [address] Phone: [phone]   Fax:  [phone]  Physical Therapy Treatment  Patient Details  Name: Natalie Erickson MRN: [id number] Date of Birth: 15-Feb-1951 Referring Provider (PT): Dr. Juleen China   Encounter Date: 07/04/2019  PT End of Session - 07/04/19 1204    Visit Number  12    Number of Visits  20    Date for PT Re-Evaluation  07/30/19    Authorization Type  HTA    PT Start Time  1100    PT Stop Time  1139    PT Time Calculation (min)  39 min    Activity Tolerance  Patient tolerated treatment well    Behavior During Therapy  [facility] for tasks assessed/performed       Past Medical History:  Diagnosis Date  . Anxiety 04/06/2017  . Cystitis   . Depression   . Dry eyes 04/06/2017  . Elevated BP without diagnosis of hypertension 04/06/2017   lost weight >> BP improved  . Fibromyalgia   . Heart murmur    hx of 2 Echocardiograms in [place] (pt was told they were normal)  . Hiatal hernia   . History of colon polyps   . History of echocardiogram    Echo 9/19: mild LVH, EF 60-65, no RWMA, Gr 1 DD, trivial MR/TR, PASP 16  . History of Lyme disease   . IBS (irritable bowel syndrome)   . Mixed hyperlipidemia 04/06/2017   intol to statins due to myalgias; never tried Zetia  . TMJ (dislocation of temporomandibular joint)   . Vasomotor rhinitis     Past Surgical History:  Procedure Laterality Date  . APPENDECTOMY  1959  . LAPAROSCOPY    . LEFT OOPHORECTOMY      There were no vitals filed for this visit.  Subjective Assessment - 07/04/19 1203    Subjective  Pt states neck is still sore, but improving.    Currently in Pain?  Yes    Pain Score  3     Pain Location  Neck    Pain Orientation  Left    Pain Descriptors / Indicators  Tightness    Pain Type  Chronic pain    Pain Onset  1 to 4 weeks ago    Pain Frequency  Intermittent                       [facility] Adult PT  Treatment/Exercise - 07/04/19 0001      Lumbar Exercises: Stretches   Other Lumbar Stretch Exercise  mid and high door way stretch 2x30 sec :     Other Lumbar Stretch Exercise  Posterior shoulder stretch 30 sec x3;       Lumbar Exercises: Standing   Row  20 reps    Theraband Level (Row)  Level 3 (Green)    Other Standing Lumbar Exercises  AROM, overhead angel motion x15;  UE flex and abd with 1 lb x15 each with cues for posture     Other Standing Lumbar Exercises  Wall push ups x15;  Scap pull outs YTB x15;       Manual Therapy   Manual therapy comments  skilled palpation and monitoring of soft tissue with dry needling.     Soft tissue mobilization  STM for bil UTs and cervical paraspinals.        Trigger Point Dry Needling - 07/04/19 0001    Consent Given?  Yes  Education Handout Provided  Previously provided    Muscles Treated Head and Neck  Upper trapezius    Muscles Treated Upper Quadrant  Infraspinatus   L   Upper Trapezius Response  Twitch reponse elicited;Palpable increased muscle length   Bil   Infraspinatus Response  Twitch response elicited;Palpable increased muscle length           PT Education - 07/04/19 1204    Education Details  Education on dry needling, use, precautions, risks, benefits.    Person(s) Educated  Patient    Methods  Explanation    Comprehension  Verbalized understanding       PT Short Term Goals - 07/02/19 1106      PT SHORT TERM GOAL #1   Title  Pt to be independent with initial HEP    Time  2    Period  Weeks    Status  Achieved    Target Date  06/11/19        PT Long Term Goals - 07/02/19 1107      PT LONG TERM GOAL #1   Title  Pt to be independent with long term HEP     Time  6    Period  Weeks    Status  Partially Met    Target Date  07/30/19      PT LONG TERM GOAL #2   Title  Pt to report decreased pain to 0-2/10 with activity    Time  6    Period  Weeks    Status  Partially Met    Target Date  07/30/19       PT LONG TERM GOAL #3   Title  Pt to demo increased core strength, to be [facility], with minimal postural changes with stabilization exercises, to improve stability and pain    Time  6    Period  Weeks    Status  Partially Met    Target Date  07/30/19      PT LONG TERM GOAL #4   Title  Pt to demo ability to lift/carry at least 10 lb with proper mechanics and no pain in back, to improve safety with functional activitiy  .    Time  6    Period  Weeks    Status  Partially Met    Target Date  07/30/19      PT LONG TERM GOAL #5   Title  Pt to demo cervical ROM and soft tissue restrictions to be [facility], with pain no more than 2/10    Time  4    Period  Weeks    Status  New    Target Date  07/30/19            Plan - 07/04/19 1206    Clinical Impression Statement  Pt with good tolerance for dry needling today, may benefit from cervical multifidus needling as well for pain on L. Pt improving with ability for UE and core strength, requires less cueing for posture. Plan to progress as tolerated.    Personal Factors and Comorbidities  Comorbidity 1    Examination-Activity Limitations  Locomotion Level;Reach Overhead;Sleep;Lift    Examination-Participation Restrictions  Cleaning;Driving    Stability/Clinical Decision Making  Stable/Uncomplicated    Rehab Potential  Good    PT Frequency  2x / week    PT Duration  4 weeks    PT Treatment/Interventions  ADLs/Self Care Home Management;Electrical Stimulation;Cryotherapy;Ultrasound;Moist Heat;Traction;Iontophoresis 56m/ml Dexamethasone;Stair training;Functional mobility training;Therapeutic activities;Therapeutic exercise;Balance training;Neuromuscular re-education;Manual  techniques;Patient/family education;Passive range of motion;Dry needling;Taping;Spinal Manipulations;Joint Manipulations    Consulted and Agree with Plan of Care  Patient       Patient will benefit from skilled therapeutic intervention in order to improve the following deficits and  impairments:  Decreased range of motion, Decreased endurance, Increased muscle spasms, Pain, Decreased activity tolerance, Improper body mechanics, Decreased strength  Visit Diagnosis: 1. Pain in thoracic spine   2. Cervicalgia        Problem List Patient Active Problem List   Diagnosis Date Noted  . 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 Change in stool 11/14/2018  . Posterior vitreous detachment of right eye 09/10/2018  . History of IBS 01/30/2018  . Urolithiasis 06/23/2017  . Fibromyalgia 06/04/2017  . Chronic fatigue 06/04/2017  . Medication intolerance 06/04/2017  . ANA positive 05/20/2017  . 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 Serum calcium elevated 05/20/2017  . Interstitial cystitis 05/20/2017  . Dyspepsia 05/20/2017  . Mixed hyperlipidemia 04/06/2017  . Elevated BP without diagnosis of hypertension 04/06/2017  . Anxiety 04/06/2017  . Dry eyes 04/06/2017    Lyndee Hensen, PT, DPT 12:07 PM  07/04/19    [facility] [place], [place], [address] Phone: [phone]   Fax:  [phone]  Name: Alizey Noren MRN: [id number] Date of Birth: [birth year]

## 2019-07-09 ENCOUNTER — Encounter: Payer: Self-pay | Admitting: Physical Therapy

## 2019-07-09 ENCOUNTER — Other Ambulatory Visit: Payer: Self-pay

## 2019-07-09 ENCOUNTER — Ambulatory Visit (INDEPENDENT_AMBULATORY_CARE_PROVIDER_SITE_OTHER): Payer: PPO | Admitting: Physical Therapy

## 2019-07-09 DIAGNOSIS — M546 Pain in thoracic spine: Secondary | ICD-10-CM

## 2019-07-09 DIAGNOSIS — M542 Cervicalgia: Secondary | ICD-10-CM

## 2019-07-09 NOTE — Therapy (Addendum)
[facility] [address] [place], [place], [address] Phone: [phone]   Fax:  [phone]  Physical Therapy Treatment  Patient Details  Name: Natalie Erickson MRN: [id number] Date of Birth: September 20, 1951 Referring Provider (PT): Dr. Juleen China   Encounter Date: 07/09/2019  PT End of Session - 07/09/19 1051    Visit Number  13    Number of Visits  20    Date for PT Re-Evaluation  07/30/19    Authorization Type  HTA    PT Start Time  0955    PT Stop Time  1040    PT Time Calculation (min)  45 min    Activity Tolerance  Patient tolerated treatment well    Behavior During Therapy  [facility] for tasks assessed/performed       Past Medical History:  Diagnosis Date  . Anxiety 04/06/2017  . Cystitis   . Depression   . Dry eyes 04/06/2017  . Elevated BP without diagnosis of hypertension 04/06/2017   lost weight >> BP improved  . Fibromyalgia   . Heart murmur    hx of 2 Echocardiograms in [place] (pt was told they were normal)  . Hiatal hernia   . History of colon polyps   . History of echocardiogram    Echo 9/19: mild LVH, EF 60-65, no RWMA, Gr 1 DD, trivial MR/TR, PASP 16  . History of Lyme disease   . IBS (irritable bowel syndrome)   . Mixed hyperlipidemia 04/06/2017   intol to statins due to myalgias; never tried Zetia  . TMJ (dislocation of temporomandibular joint)   . Vasomotor rhinitis     Past Surgical History:  Procedure Laterality Date  . APPENDECTOMY  1959  . LAPAROSCOPY    . LEFT OOPHORECTOMY      There were no vitals filed for this visit.  Subjective Assessment - 07/09/19 1049    Subjective  Pt states neck is more sore in last couple days. She had lymphatic massage on Friday, states that she has been very fatigued for a couple days. She also states waking up with anxiety, and waking up with L jaw/ear pain. She attributes this to anxiety. Neck is still bothering her.    Currently in Pain?  Yes    Pain Score  4     Pain  Location  Neck    Pain Orientation  Left    Pain Descriptors / Indicators  Aching;Tightness    Pain Type  Chronic pain    Pain Onset  1 to 4 weeks ago    Pain Frequency  Intermittent    Aggravating Factors   L rotation         [facility] PT Assessment - 07/09/19 0001      AROM   Overall AROM Comments  Cervical: mild limitations for L rotation, and bil SB. Increased discomfort in throat with flexion;  Thoracic spine: mild limitations for all motions,  Lumbar spine: mild limitations for ext and SB.      Strength   Overall Strength Comments  Shoulders: 4+/5,   Core: 4/5 ;                      [facility] Adult PT Treatment/Exercise - 07/09/19 0001      Lumbar Exercises: Standing   Row  20 reps    Theraband Level (Row)  Level 3 (Green)    Other Standing Lumbar Exercises  AROM, overhead angel motion x15;  UE flex and abd  with 1 lb x15 each with cues for posture       Lumbar Exercises: Supine   Other Supine Lumbar Exercises  Supine UE flys 2RTB x15 bil; Supine UE flexion 2lb x15;       Manual Therapy   Joint Mobilization  cervical PA mobs, unilateral L cervical mobs to improve L rotation.     Soft tissue mobilization  STM for bil UTs and cervical paraspinals and sub occipitals.     Passive ROM  manual stretch for UTs, levator, and sub occipitals.              PT Education - 07/09/19 1050    Education Details  Reviewed final HEP, discussed hold for now, discussed referral to sports med.    Person(s) Educated  Patient    Methods  Explanation    Comprehension  Verbalized understanding       PT Short Term Goals - 07/02/19 1106      PT SHORT TERM GOAL #1   Title  Pt to be independent with initial HEP    Time  2    Period  Weeks    Status  Achieved    Target Date  06/11/19        PT Long Term Goals - 07/09/19 1051      PT LONG TERM GOAL #1   Title  Pt to be independent with long term HEP     Time  6    Period  Weeks    Status  Achieved      PT LONG TERM  GOAL #2   Title  Pt to report decreased pain to 0-2/10 with activity    Time  6    Period  Weeks    Status  Partially Met      PT LONG TERM GOAL #3   Title  Pt to demo increased core strength, to be WFL, with minimal postural changes with stabilization exercises, to improve stability and pain    Time  6    Period  Weeks    Status  Achieved      PT LONG TERM GOAL #4   Title  Pt to demo ability to lift/carry at least 10 lb with proper mechanics and no pain in back, to improve safety with functional activitiy  .    Time  6    Period  Weeks    Status  Achieved      PT LONG TERM GOAL #5   Title  Pt to demo cervical ROM and soft tissue restrictions to be [facility], with pain no more than 2/10    Time  4    Period  Weeks    Status  Partially Met            Plan - 07/09/19 1052    Clinical Impression Statement  Pt declines more dry needling for neck pain. She continues to states cervical pain, that fluctuates. Discussed sympotms at length, and discussed referral to sports med for management. PT will be put on hold for now. Pt independent wtih HEP and doing well with postural awareness and strengthening. Pt states pain in jaw, ear, and into throat. She does likely have some TMJ, and possible high cervical facet pathology. She would benefit from further dry needling, but pt does not want to do this. She attributes some pain to anxitey and will discuss options with PCP at next visit.    Personal Factors and Comorbidities  Comorbidity 1  Examination-Activity Limitations  Locomotion Level;Reach Overhead;Sleep;Lift    Examination-Participation Restrictions  Cleaning;Driving    Stability/Clinical Decision Making  Stable/Uncomplicated    Rehab Potential  Good    PT Frequency  2x / week    PT Duration  4 weeks    PT Treatment/Interventions  ADLs/Self Care Home Management;Electrical Stimulation;Cryotherapy;Ultrasound;Moist Heat;Traction;Iontophoresis 20m/ml Dexamethasone;Stair training;Functional  mobility training;Therapeutic activities;Therapeutic exercise;Balance training;Neuromuscular re-education;Manual techniques;Patient/family education;Passive range of motion;Dry needling;Taping;Spinal Manipulations;Joint Manipulations    Consulted and Agree with Plan of Care  Patient       Patient will benefit from skilled therapeutic intervention in order to improve the following deficits and impairments:  Decreased range of motion, Decreased endurance, Increased muscle spasms, Pain, Decreased activity tolerance, Improper body mechanics, Decreased strength  Visit Diagnosis: 1. Pain in thoracic spine   2. Cervicalgia        Problem List Patient Active Problem List   Diagnosis Date Noted  . 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 Change in stool 11/14/2018  . Posterior vitreous detachment of right eye 09/10/2018  . History of IBS 01/30/2018  . Urolithiasis 06/23/2017  . Fibromyalgia 06/04/2017  . Chronic fatigue 06/04/2017  . Medication intolerance 06/04/2017  . ANA positive 05/20/2017  . 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 Serum calcium elevated 05/20/2017  . Interstitial cystitis 05/20/2017  . Dyspepsia 05/20/2017  . Mixed hyperlipidemia 04/06/2017  . Elevated BP without diagnosis of hypertension 04/06/2017  . Anxiety 04/06/2017  . Dry eyes 04/06/2017   LLyndee Hensen PT, DPT 11:01 AM  07/09/19     [facility] [place] [address]Phone: [phone]  Fax:  [phone] Name: CRomelia BromellMRN: [id number] of Birth: 127-Sep-1952   PHYSICAL THERAPY DISCHARGE SUMMARY  Visits from Start of Care: 13 Plan: Patient agrees to discharge.  Patient goals were partially met. Patient is being discharged due to                                                     ?????     Pt put on hold, will refer to MD.   LLyndee Hensen PT, DPT 10:15 AM  09/30/19

## 2019-07-10 ENCOUNTER — Encounter: Payer: Self-pay | Admitting: Family Medicine

## 2019-07-10 ENCOUNTER — Ambulatory Visit: Payer: Self-pay

## 2019-07-10 ENCOUNTER — Telehealth: Payer: Self-pay | Admitting: Family Medicine

## 2019-07-10 ENCOUNTER — Ambulatory Visit (INDEPENDENT_AMBULATORY_CARE_PROVIDER_SITE_OTHER): Payer: PPO | Admitting: Family Medicine

## 2019-07-10 DIAGNOSIS — M546 Pain in thoracic spine: Secondary | ICD-10-CM | POA: Diagnosis not present

## 2019-07-10 DIAGNOSIS — M542 Cervicalgia: Secondary | ICD-10-CM

## 2019-07-10 MED ORDER — BACLOFEN 10 MG PO TABS: 5.0000 mg | ORAL_TABLET | Freq: Three times a day (TID) | ORAL | 1 refills | Status: DC | PRN
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
## 2019-07-10 NOTE — Telephone Encounter (Signed)
I advised the patient to stop the PT, but ok to continue the home exercises. The plain xrays will be done the same day as the MRI [facility] will be in touch with her to schedule this.

## 2019-07-10 NOTE — Telephone Encounter (Signed)
X-rays will be done at the MRI facility on the same day.  Can stop PT for now until we get results.

## 2019-07-10 NOTE — Telephone Encounter (Signed)
Patient called asked if Dr Junius Roads want her to continue (PT) and continue exercising at home.  Patient asked when will she hear from the x-ray department concerning not getting her X-rays today. Patient want to know when can she get the x-rays done? The number to contact patient is [phone]

## 2019-07-10 NOTE — Telephone Encounter (Signed)
Please advise 

## 2019-07-11 ENCOUNTER — Inpatient Hospital Stay (HOSPITAL_COMMUNITY): Admission: RE | Admit: 2019-07-11 | Payer: PPO | Source: Ambulatory Visit

## 2019-07-12 ENCOUNTER — Ambulatory Visit (INDEPENDENT_AMBULATORY_CARE_PROVIDER_SITE_OTHER): Payer: PPO | Admitting: Family Medicine

## 2019-07-12 ENCOUNTER — Telehealth: Payer: Self-pay

## 2019-07-12 ENCOUNTER — Encounter: Payer: Self-pay | Admitting: Family Medicine

## 2019-07-12 VITALS — BP 113/76 | HR 88 | Temp 98.1°F | Ht 62.0 in | Wt 133.0 lb

## 2019-07-12 DIAGNOSIS — G4730 Sleep apnea, unspecified: Secondary | ICD-10-CM | POA: Diagnosis not present

## 2019-07-12 DIAGNOSIS — Z0289 Encounter for other administrative examinations: Secondary | ICD-10-CM

## 2019-07-12 DIAGNOSIS — M797 Fibromyalgia: Secondary | ICD-10-CM

## 2019-07-12 DIAGNOSIS — R768 Other specified abnormal immunological findings in serum: Secondary | ICD-10-CM

## 2019-07-12 DIAGNOSIS — F418 Other specified anxiety disorders: Secondary | ICD-10-CM | POA: Insufficient documentation

## 2019-07-12 DIAGNOSIS — M542 Cervicalgia: Secondary | ICD-10-CM | POA: Diagnosis not present

## 2019-07-12 DIAGNOSIS — R4589 Other symptoms and signs involving emotional state: Secondary | ICD-10-CM | POA: Insufficient documentation

## 2019-07-12 MED ORDER — AMITRIPTYLINE HCL 10 MG PO TABS: 10.0000 mg | ORAL_TABLET | Freq: Every day | ORAL | 2 refills | Status: DC

## 2019-07-12 NOTE — Telephone Encounter (Signed)
Copied from [place]. Topic: General - Inquiry >> Jul 12, 2019 12:41 PM Natalie Erickson wrote: Reason for CRM: Pt stated Dr. Juleen China recommended a book called Breathe during their virtual appt. She needs the name of the author. Okay to send Mychart message or return call.

## 2019-07-12 NOTE — Progress Notes (Signed)
Virtual Visit via Video   Due to the COVID-19 pandemic, this visit was completed with telemedicine (audio/video) technology to reduce patient and provider exposure as well as to preserve personal protective equipment.   I connected with Natalie Erickson by a video enabled telemedicine application and verified that I am speaking with the correct person using two identifiers. Location patient: Home Location provider: [facility], Office Persons participating in the virtual visit: Mahnoor Mathisen, Briscoe Deutscher, DO   I discussed the limitations of evaluation and management by telemedicine and the availability of in person appointments. The patient expressed understanding and agreed to proceed.  Care Team   Patient Care Team: Briscoe Deutscher, DO as PCP - General (Family Medicine) [name], MD as PCP - Cardiology (Cardiology) Loletha Carrow Kirke Corin, MD as Consulting Physician (Gastroenterology) Murrell Redden Earlyne Iba, MD as Consulting Physician (Obstetrics and Gynecology) Hortencia Pilar, MD as Consulting Physician (Surgery) Lyndee Hensen, PT as Physical Therapist (Physical Therapy) Kennith Center, RD as Dietitian (Family Medicine)  Subjective:   HPI: Was enjoying PT with Ander Purpura. Pain in neck, tension headaches worsening, though. Saw SM, ordered XR and MRI neck to check facets. Patient leery of injections but open to traction.  Interested in starting a medication for anxiety and pain. Having a hard time sleeping due to pain. Asks if Advil okay to consider at night.  Wants sleep study. Snoring.  Review of Systems  Constitutional: Negative for chills, fever, malaise/fatigue and weight loss.  Respiratory: Negative for cough, shortness of breath and wheezing.   Cardiovascular: Negative for chest pain, palpitations and leg swelling.  Gastrointestinal: Negative for abdominal pain, constipation, diarrhea, nausea and vomiting.  Genitourinary: Negative for dysuria and urgency.    Musculoskeletal: Positive for back pain, joint pain, myalgias and neck pain.  Skin: Negative for rash.  Neurological: Positive for headaches. Negative for dizziness.  Psychiatric/Behavioral: Negative for depression, substance abuse and suicidal ideas. The patient is nervous/anxious.     Patient Active Problem List   Diagnosis Date Noted  . Cervicalgia 07/12/2019  . 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 Anxiety about health 07/12/2019  . 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 Change in stool 11/14/2018  . Posterior vitreous detachment of right eye 09/10/2018  . History of IBS 01/30/2018  . Urolithiasis 06/23/2017  . Fibromyalgia 06/04/2017  . Chronic fatigue 06/04/2017  . Medication intolerance 06/04/2017  . ANA positive 05/20/2017  . 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 Serum calcium elevated 05/20/2017  . Interstitial cystitis 05/20/2017  . Dyspepsia 05/20/2017  . Mixed hyperlipidemia 04/06/2017  . Elevated BP without diagnosis of hypertension 04/06/2017  . Anxiety 04/06/2017  . Dry eyes 04/06/2017    Social History   Tobacco Use  . Smoking status: Never Smoker  . Smokeless tobacco: Never Used  Substance Use Topics  . Alcohol use: No   Current Outpatient Medications:  .  acetaminophen (TYLENOL) 500 MG tablet, Take 1,000 mg by mouth every 6 (six) hours as needed for headache (pain)., Disp: , Rfl:  .  baclofen (LIORESAL) 10 MG tablet, Take 0.5-1 tablets (5-10 mg total) by mouth 3 (three) times daily as needed for muscle spasms., Disp: 30 each, Rfl: 1 .  Cholecalciferol (VITAMIN D) 2000 units tablet, Take 2,000 Units by mouth daily. , Disp: , Rfl:  .  docusate sodium (COLACE) 50 MG capsule, Take by mouth., Disp: , Rfl:  .  MAGNESIUM GLUCONATE PO, Take 200 mg by mouth as directed. , Disp: , Rfl:  .  NON  FORMULARY, , Disp: , Rfl:  .  Polyethyl Glycol-Propyl Glycol (SYSTANE ULTRA OP), Apply to eye 4 (four) times daily., Disp: , Rfl:  .  b complex vitamins capsule, Take 1 capsule by  mouth daily., Disp: , Rfl:  .  Calcium & Magnesium Carbonates (MYLANTA PO), Take 20 mLs by mouth as directed. , Disp: , Rfl:  .  Cyanocobalamin (VITAMIN B-12 PO), Take by mouth., Disp: , Rfl:  .  Simethicone (GAS-X PO), Take by mouth as directed. , Disp: , Rfl:   Allergies  Allergen Reactions  . Diltiazem Shortness Of Breath  . Metoprolol Shortness Of Breath  . Statins Tinitus    Achy joints, muscle aches Achy joints, muscle aches  . Banana Other (See Comments)    migraine  . Chocolate Other (See Comments)    migraine  . Cocoa Other (See Comments)    migraine migraine  . Codeine Nausea And Vomiting  . Cranberry Other (See Comments)    Cystitis  . Latex Rash  . Monosodium Glutamate Other (See Comments)    Migraine  . Penicillins Rash    Has patient had a PCN reaction causing immediate rash, facial/tongue/throat swelling, SOB or lightheadedness with hypotension: Yes Has patient had a PCN reaction causing severe rash involving mucus membranes or skin necrosis: No Has patient had a PCN reaction that required hospitalization No Has patient had a PCN reaction occurring within the last 10 years: No If all of the above answers are "NO", then may proceed with Cephalosporin use.   Marland Kitchen Antihistamines, Loratadine-Type     Throat swelling  . Carvedilol Other (See Comments)    N&N, SOB , Head ache and joint pain.   . Eggs Or Egg-Derived Products   . Ginger   . Gluten Meal   . Lexapro [Escitalopram]     GI   . Montelukast Other (See Comments)    Throat swelling  . Other Swelling    Throat swelling  . Prednisone Swelling    Throat swelling (no difficulty breathing)  . Sulfites Other (See Comments)    Nausea and headaches  . Vanilla   . Whey   . Zofran [Ondansetron Hcl] Other (See Comments)    Muscle cramps, leg tingling  . Azithromycin Other (See Comments) and Diarrhea    Objective:   VITALS: Per patient if applicable, see vitals. GENERAL: Alert, appears well and in no acute  distress. HEENT: Atraumatic, conjunctiva clear, no obvious abnormalities on inspection of external nose and ears. NECK: Normal movements of the head and neck. CARDIOPULMONARY: No increased WOB. Speaking in clear sentences. I:E ratio WNL.  MS: Moves all visible extremities without noticeable abnormality. PSYCH: Pleasant and cooperative, well-groomed. Speech normal rate and rhythm. Affect is appropriate. Insight and judgement are appropriate. Attention is focused, linear, and appropriate.  NEURO: CN grossly intact. Oriented as arrived to appointment on time with no prompting. Moves both UE equally.  SKIN: No obvious lesions, wounds, erythema, or cyanosis noted on face or hands.  Depression screen [facility] 2/9 07/12/2019 04/25/2018 04/06/2017  Decreased Interest 1 0 0  Down, Depressed, Hopeless 1 0 0  PHQ - 2 Score 2 0 0  Altered sleeping 1 1 -  Tired, decreased energy 3 0 -  Change in appetite 0 0 -  Feeling bad or failure about yourself  0 0 -  Trouble concentrating 1 0 -  Moving slowly or fidgety/restless 1 0 -  Suicidal thoughts 0 0 -  PHQ-9 Score 8 1 -  Difficult doing work/chores Somewhat difficult Not difficult at all -    Assessment and Plan:   Chidinma was seen today for follow-up.  Diagnoses and all orders for this visit:  Cervicalgia -     amitriptyline (ELAVIL) 10 MG tablet; Take 1 tablet (10 mg total) by mouth at bedtime.  ANA positive  Fibromyalgia -     amitriptyline (ELAVIL) 10 MG tablet; Take 1 tablet (10 mg total) by mouth at bedtime.  Sleep disorder breathing -     Ambulatory referral to Sleep Studies  Anxiety about health   . COVID-19 Education: The signs and symptoms of COVID-19 were discussed with the patient and how to seek care for testing if needed. The importance of social distancing was discussed today. . Reviewed expectations re: course of current medical issues. . Discussed self-management of symptoms. . Outlined signs and symptoms indicating need for  more acute intervention. . Patient verbalized understanding and all questions were answered. Marland Kitchen Health Maintenance issues including appropriate healthy diet, exercise, and smoking avoidance were discussed with patient. . See orders for this visit as documented in the electronic medical record.  Briscoe Deutscher, DO  Records requested if needed. Time spent: 25 minutes, of which >50% was spent in obtaining information about her symptoms, reviewing her previous labs, evaluations, and treatments, counseling her about her condition (please see the discussed topics above), and developing a plan to further investigate it; she had a number of questions which I addressed.

## 2019-07-13 NOTE — Telephone Encounter (Signed)
Breathe: A New Science of a Lost Art, by [employer].    Just for documentation, not for patient: I am not an advocate for this book. Patient prefers anything "non-Western." Trying to give her options based on what other patient recommended.

## 2019-07-15 ENCOUNTER — Encounter: Payer: Self-pay | Admitting: Physical Therapy

## 2019-07-15 NOTE — Telephone Encounter (Signed)
Pt sent MyChart message with book/author info.

## 2019-07-16 ENCOUNTER — Ambulatory Visit
Admission: RE | Admit: 2019-07-16 | Discharge: 2019-07-16 | Disposition: A | Discharge status: Home / Self Care | Payer: PPO | Source: Ambulatory Visit | Attending: Family Medicine | Admitting: Family Medicine

## 2019-07-16 ENCOUNTER — Other Ambulatory Visit: Payer: Self-pay

## 2019-07-16 DIAGNOSIS — M542 Cervicalgia: Secondary | ICD-10-CM

## 2019-07-16 DIAGNOSIS — M4802 Spinal stenosis, cervical region: Secondary | ICD-10-CM | POA: Diagnosis not present

## 2019-07-16 DIAGNOSIS — M5090 Cervical disc disorder, unspecified, unspecified cervical region: Secondary | ICD-10-CM | POA: Diagnosis not present

## 2019-07-18 ENCOUNTER — Telehealth: Payer: Self-pay | Admitting: Family Medicine

## 2019-07-18 NOTE — Telephone Encounter (Signed)
Pt called in to make provider aware that medication amitriptyline (ELAVIL) 10 MG tablet is causing Nausea and is causing her IBS to bother her per pt.   Pt would like to know if there is something else that she could take for anxiety instead?   Please advise.

## 2019-07-18 NOTE — Telephone Encounter (Signed)
Patient called in wanting to know if MRI results were back and if they are if she needed to call and schedule appt to come in for results

## 2019-07-18 NOTE — Telephone Encounter (Signed)
Please see message and advise 

## 2019-07-18 NOTE — Telephone Encounter (Signed)
I left a full message on the patient's voice mail. The MRI results are normally either sent to the patient through MyChart or she is called with the results. Dr. Junius Roads is out of the clinic until 07/22/19, so she may not receive a message until then.

## 2019-07-20 NOTE — Telephone Encounter (Signed)
Let her know that I'd like her to hold on medication for about a week to let her body recover. If she would still like treatment for anxiety, maybe we should consider EMDR (eye movement desensitization and reprocessing). Not medication but targeted therapy. Tell her to read about it and tell me what she thinks.

## 2019-07-21 ENCOUNTER — Telehealth: Payer: Self-pay | Admitting: Family Medicine

## 2019-07-21 NOTE — Telephone Encounter (Signed)
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  Also several bone spurs and disc bulges which cause narrowing of the spinal canal and some of the nerve openings.  No clear-cut indication for surgery.  Could continue with physical therapy, or else possibly try chiropractic instead depending on your preference.    Also the thyroid gland appears to have nodules, and the radiologist recommends ultrasound to evaluate.  If you're in agreement, I'll place orders for that.

## 2019-07-22 ENCOUNTER — Telehealth: Payer: Self-pay | Admitting: Family Medicine

## 2019-07-22 ENCOUNTER — Other Ambulatory Visit: Payer: Self-pay

## 2019-07-22 DIAGNOSIS — E042 Nontoxic multinodular goiter: Secondary | ICD-10-CM

## 2019-07-22 DIAGNOSIS — M542 Cervicalgia: Secondary | ICD-10-CM

## 2019-07-22 DIAGNOSIS — M546 Pain in thoracic spine: Secondary | ICD-10-CM

## 2019-07-22 DIAGNOSIS — R9389 Abnormal findings on diagnostic imaging of other specified body structures: Secondary | ICD-10-CM

## 2019-07-22 NOTE — Telephone Encounter (Signed)
Ordered

## 2019-07-22 NOTE — Telephone Encounter (Signed)
[facility] called stated this patient told her [facility] was doing a referral to them for PT. No order/referral and need demographics and Insurance info.

## 2019-07-22 NOTE — Telephone Encounter (Signed)
This has been scheduled by [facility].

## 2019-07-22 NOTE — Telephone Encounter (Signed)
Please advise 

## 2019-07-22 NOTE — Telephone Encounter (Signed)
Please fax the order over to [facility].

## 2019-07-22 NOTE — Telephone Encounter (Signed)
Order placed will you let me know if not in right.

## 2019-07-22 NOTE — Telephone Encounter (Signed)
Looks good.  I will let you know if they reach out and need something changed.  Thanks!

## 2019-07-22 NOTE — Telephone Encounter (Signed)
Yes, order thyroid ultrasound for multinodular thyroid seen on MRI.

## 2019-07-22 NOTE — Telephone Encounter (Signed)
Patient stated was told she was to have [place] on thyroid. No order in. [facility] has called [facility] they have no order.  Please call patient to advise.  [phone]

## 2019-07-22 NOTE — Telephone Encounter (Signed)
Fax order to [phone]

## 2019-07-22 NOTE — Telephone Encounter (Signed)
I spoke with patient and informed her of Dr. Alcario Drought message.  Patient explained that she will look into EMDR, and that she had a MRI and they saw some disc and recommend that she starts PT.  Patient said that she has an appointment at [place].  Patient also said that she need a ultrasound of her thyroid.  I told patient that I would send message to Dr. Juleen China and get back with her.

## 2019-07-23 ENCOUNTER — Telehealth: Payer: Self-pay | Admitting: Family Medicine

## 2019-07-23 DIAGNOSIS — M546 Pain in thoracic spine: Secondary | ICD-10-CM | POA: Diagnosis not present

## 2019-07-23 DIAGNOSIS — M47892 Other spondylosis, cervical region: Secondary | ICD-10-CM | POA: Diagnosis not present

## 2019-07-23 NOTE — Telephone Encounter (Signed)
Patient left a voice mail message stating that she had given the wrong fax number for [facility].  The fax number is [phone].  Thank you.

## 2019-07-23 NOTE — Telephone Encounter (Signed)
Order was faxed this morning to [phone] This was given by PT scheduling this morning when I called, they were aware of patient.

## 2019-07-23 NOTE — Telephone Encounter (Signed)
Could you refax the PT order to this number?

## 2019-07-23 NOTE — Telephone Encounter (Signed)
Patient called this morning stating that the PT facility cancelled her appointment this morning because they had not received the referral.  Patient is wanting the referral sent to them so that she can r/s her appointment.  Thank you.

## 2019-07-23 NOTE — Telephone Encounter (Signed)
Order has been faxed, spoke with someone from physical therapy scheduling they are aware order will be coming through on fax.

## 2019-07-26 DIAGNOSIS — M546 Pain in thoracic spine: Secondary | ICD-10-CM | POA: Diagnosis not present

## 2019-07-26 DIAGNOSIS — M47892 Other spondylosis, cervical region: Secondary | ICD-10-CM | POA: Diagnosis not present

## 2019-07-29 ENCOUNTER — Encounter: Payer: Self-pay | Admitting: Family Medicine

## 2019-07-29 ENCOUNTER — Other Ambulatory Visit: Payer: Self-pay

## 2019-07-29 ENCOUNTER — Ambulatory Visit (INDEPENDENT_AMBULATORY_CARE_PROVIDER_SITE_OTHER): Payer: PPO | Admitting: Family Medicine

## 2019-07-29 DIAGNOSIS — M542 Cervicalgia: Secondary | ICD-10-CM | POA: Diagnosis not present

## 2019-07-29 NOTE — Progress Notes (Signed)
Office Visit Note   Patient: Natalie Erickson           Date of Birth: 1951/01/08           MRN: [id number] Visit Date: 07/29/2019 Requested by: Briscoe Deutscher, [address] [place],  [place] [address] PCP: Briscoe Deutscher, DO  Subjective: Chief Complaint  Patient presents with  . Intermittent numbness in mouth/ear pain/soft palette issue    HPI: She is here with concerns about her neck.  She has [place] questions about her x-ray and MRI reports.  She was concerned that anterior osteophytes at C5-6 might be causing some sleep apnea type issues and swallowing troubles.  She feels at times like her soft palate is collapsing when she tries to sleep on her back.  She also wonders whether baclofen might be contributing to that.  She has been to physical therapy and plans to keep trying it.  She gets occasional tingling sensation in her face but no symptoms in her arms.               ROS: Denies fevers or chills.  All other systems were reviewed and are negative.  Objective: Vital Signs: There were no vitals taken for this visit.  Physical Exam:  General:  Alert and oriented, in no acute distress. Pulm:  Breathing unlabored. Psy:  Normal mood, congruent affect. Skin: No rash on her skin. Neck: She has good range of motion.  Upper extremity strength is normal.  I do not palpate any abnormalities in the anterior neck.  Imaging: None today.  I reviewed her x-rays and MRI scan images in detail.    Assessment & Plan: 1.  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  I do not think her current facial numbness and anterior neck symptoms are related to her findings. -Continue with physical therapy.  She plans to stop baclofen in case it might be contributing to her soft palate sensations.  Could contemplate surgical consult if symptoms worsen.  She is hesitant to consider epidural injection.     Procedures: No procedures performed  No notes on file      PMFS History: Patient Active Problem List   Diagnosis Date Noted  . Cervicalgia 07/12/2019  . 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 Anxiety about health 07/12/2019  . 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 Change in stool 11/14/2018  . Posterior vitreous detachment of right eye 09/10/2018  . History of IBS 01/30/2018  . Urolithiasis 06/23/2017  . Fibromyalgia 06/04/2017  . Chronic fatigue 06/04/2017  . Medication intolerance 06/04/2017  . ANA positive 05/20/2017  . 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 Serum calcium elevated 05/20/2017  . Interstitial cystitis 05/20/2017  . Dyspepsia 05/20/2017  . Mixed hyperlipidemia 04/06/2017  . Elevated BP without diagnosis of hypertension 04/06/2017  . Anxiety 04/06/2017  . Dry eyes 04/06/2017   Past Medical History:  Diagnosis Date  . Anxiety 04/06/2017  . Cystitis   . Depression   . Dry eyes 04/06/2017  . Elevated BP without diagnosis of hypertension 04/06/2017   lost weight >> BP improved  . Fibromyalgia   . Heart murmur    hx of 2 Echocardiograms in [place] (pt was told they were normal)  . Hiatal hernia   . History of colon polyps   . History of echocardiogram    Echo 9/19: mild LVH, EF 60-65, no RWMA, Gr 1 DD, trivial MR/TR, PASP 16  . History of Lyme disease   .  IBS (irritable bowel syndrome)   . Mixed hyperlipidemia 04/06/2017   intol to statins due to myalgias; never tried Zetia  . TMJ (dislocation of temporomandibular joint)   . Vasomotor rhinitis     Family History  Problem Relation Age of Onset  . Breast cancer Sister   . Diabetes Paternal Grandmother   . Hypertension Mother   . Lung cancer Father   . Colon cancer Neg Hx   . Stomach cancer Neg Hx   . Heart attack Neg Hx   . Heart failure Neg Hx     Past Surgical History:  Procedure Laterality Date  . APPENDECTOMY  1959  . LAPAROSCOPY    . LEFT OOPHORECTOMY     Social History   Occupational History  . Occupation: Retired  Tobacco Use  . Smoking  status: Never Smoker  . Smokeless tobacco: Never Used  Substance and Sexual Activity  . Alcohol use: No  . Drug use: No  . Sexual activity: Yes

## 2019-07-30 ENCOUNTER — Other Ambulatory Visit: Payer: PPO

## 2019-07-30 ENCOUNTER — Encounter: Payer: Self-pay | Admitting: Family Medicine

## 2019-07-30 ENCOUNTER — Ambulatory Visit (INDEPENDENT_AMBULATORY_CARE_PROVIDER_SITE_OTHER): Payer: PPO | Admitting: Family Medicine

## 2019-07-30 VITALS — BP 120/64 | HR 88 | Temp 99.0°F | Ht 62.0 in | Wt 129.0 lb

## 2019-07-30 DIAGNOSIS — M542 Cervicalgia: Secondary | ICD-10-CM

## 2019-07-30 DIAGNOSIS — E041 Nontoxic single thyroid nodule: Secondary | ICD-10-CM

## 2019-07-30 DIAGNOSIS — R9389 Abnormal findings on diagnostic imaging of other specified body structures: Secondary | ICD-10-CM

## 2019-07-30 DIAGNOSIS — F419 Anxiety disorder, unspecified: Secondary | ICD-10-CM | POA: Diagnosis not present

## 2019-07-30 DIAGNOSIS — R636 Underweight: Secondary | ICD-10-CM

## 2019-07-30 DIAGNOSIS — M47892 Other spondylosis, cervical region: Secondary | ICD-10-CM | POA: Diagnosis not present

## 2019-07-30 DIAGNOSIS — G4730 Sleep apnea, unspecified: Secondary | ICD-10-CM

## 2019-07-30 DIAGNOSIS — M546 Pain in thoracic spine: Secondary | ICD-10-CM | POA: Diagnosis not present

## 2019-08-01 ENCOUNTER — Ambulatory Visit
Admission: RE | Admit: 2019-08-01 | Discharge: 2019-08-01 | Disposition: A | Discharge status: Home / Self Care | Payer: PPO | Source: Ambulatory Visit | Attending: Family Medicine | Admitting: Family Medicine

## 2019-08-01 DIAGNOSIS — E042 Nontoxic multinodular goiter: Secondary | ICD-10-CM

## 2019-08-01 DIAGNOSIS — E041 Nontoxic single thyroid nodule: Secondary | ICD-10-CM | POA: Diagnosis not present

## 2019-08-02 ENCOUNTER — Telehealth: Payer: Self-pay | Admitting: Family Medicine

## 2019-08-02 DIAGNOSIS — M47892 Other spondylosis, cervical region: Secondary | ICD-10-CM | POA: Diagnosis not present

## 2019-08-02 DIAGNOSIS — M546 Pain in thoracic spine: Secondary | ICD-10-CM | POA: Diagnosis not present

## 2019-08-02 DIAGNOSIS — E041 Nontoxic single thyroid nodule: Secondary | ICD-10-CM

## 2019-08-02 NOTE — Addendum Note (Signed)
Addended by: Hortencia Pilar on: 08/02/2019 09:29 AM   Modules accepted: Orders

## 2019-08-02 NOTE — Telephone Encounter (Signed)
Ultrasound confirms a 3.3 cm left thyroid nodule.  Radiologist recommends biopsy.

## 2019-08-04 ENCOUNTER — Encounter: Payer: Self-pay | Admitting: Family Medicine

## 2019-08-04 DIAGNOSIS — R9389 Abnormal findings on diagnostic imaging of other specified body structures: Secondary | ICD-10-CM | POA: Insufficient documentation

## 2019-08-04 NOTE — Progress Notes (Signed)
Natalie Erickson is a 68 y.o. female is here for follow up.  History of Present Illness:   HPI: Patient with degenerative findings on imaging of her neck.  Followed by orthopedics now.  Patient says that she was told that she would likely need surgery in the future.  Today, she would like to speak to me about maximizing her health for possible surgery in the future.  She does have a thyroid image scheduled already due to the incidental finding of nodule on cervical imaging.  She has a sleep evaluation scheduled for apneic episodes.  Because she failed another anxiety medication after her last visit, she is interested in the EMDR recommendation.  Dg Cervical Spine 2 Or 3 Views  Result Date: 07/17/2019 CLINICAL DATA:  Pain. EXAM: CERVICAL SPINE - 2-3 VIEW COMPARISON:  None. FINDINGS: No traumatic malalignment. No fractures. Degenerative changes are seen at multiple levels, most prominent at C5-6 with anterior osteophytes. The pre odontoid space and prevertebral soft tissues are normal. The lung apices are normal. IMPRESSION: Multilevel degenerative disc disease.  No other abnormalities. Electronically Signed   By: Dorise Bullion III M.D   On: 07/17/2019 09:25   Mr Cervical Spine W/o Contrast  Result Date: 07/17/2019 CLINICAL DATA:  Chronic left-sided neck pain with radiculopathy EXAM: MRI CERVICAL SPINE WITHOUT CONTRAST TECHNIQUE: Multiplanar, multisequence MR imaging of the cervical spine was performed. No intravenous contrast was administered. COMPARISON:  Same day radiographs FINDINGS: Alignment: Physiologic. Vertebrae: No fracture, evidence of discitis, or bone lesion. Cord: Normal signal and morphology. Posterior Fossa, vertebral arteries, paraspinal tissues: Posterior fossa unremarkable. No vascular abnormality identified. Partial visualization of a well-circumscribed heterogeneous structure in the anterior aspect of the left neck (series 3, image 14; series 8, image 25) with multiple rounded  hyperintense internal foci. This finding is incompletely imaged but is favored to reflect a multinodular left thyroid lobe. Disc levels: C2-C3: No significant disc protrusion, foraminal stenosis, or canal stenosis. C3-C4: Small posterior disc osteophyte complex. Left greater than right facet arthrosis and mild uncovertebral arthropathy results in mild left foraminal narrowing. No canal stenosis. C4-C5: Small posterior disc osteophyte complex with impress upon the ventral CSF. Mild bilateral facet arthrosis. No foraminal or canal stenosis. C5-C6: Posterior disc osteophyte complex results in mild impress upon the ventral cord. Left greater than right uncovertebral arthropathy and mild facet arthrosis resultant moderate left foraminal narrowing. Mild canal stenosis. C6-C7: Right paracentral disc osteophyte complex and right greater than left uncovertebral arthropathy results in impress upon the ventral CSF and mild right foraminal narrowing. C7-T1: No significant disc protrusion, foraminal stenosis, or canal stenosis. IMPRESSION: 1. Multilevel cervical spondylosis, as detailed above. Findings are most pronounced at the C5-6 level where a posterior disc-osteophyte complex and uncovertebral arthropathy result in moderate left foraminal narrowing and mild canal stenosis. 2. Partially visualized multinodular structure in the left neck, favored to represent a multinodular thyroid. Further evaluation with dedicated thyroid ultrasound is recommended. Electronically Signed   By: Davina Poke M.D.   On: 07/17/2019 11:27   US Thyroid  Result Date: 08/02/2019 CLINICAL DATA:  Thyroid nodule on cervical spine MRI EXAM: THYROID ULTRASOUND TECHNIQUE: Ultrasound examination of the thyroid gland and adjacent soft tissues was performed. COMPARISON:  07/16/2019 FINDINGS: Parenchymal Echotexture: Mildly heterogenous Isthmus: 2 mm Right lobe: 3.7 x 1.2 x 1.2 cm Left lobe: 4.6 x 2.1 x 2.1 cm  _________________________________________________________ Estimated total number of nodules >/= 1 cm: 1 Number of spongiform nodules >/=  2 cm not described below (  TR1): 0 Number of mixed cystic and solid nodules >/= 1.5 cm not described below ([place]): 0 _________________________________________________________ Nodule # 2: Location: Left; Mid Maximum size: 3.3 cm; Other 2 dimensions: 2.3 x 1.7 cm Composition: solid/almost completely solid (2) Echogenicity: hypoechoic (2) Shape: not taller-than-wide (0) Margins: smooth (0) Echogenic foci: none (0) ACR TI-RADS total points: 4. ACR TI-RADS risk category: TR4 (4-6 points). ACR TI-RADS recommendations: **Given size (>/= 1.5 cm) and appearance, fine needle aspiration of this moderately suspicious nodule should be considered based on TI-RADS criteria. _________________________________________________________   There are additional subcentimeter hypoechoic and cystic nodules noted bilaterally all measuring 9 mm or less in size. These would not meet criteria for any biopsy or follow-up. Hypervascularity No regional adenopathy IMPRESSION: 3.3 cm left mid thyroid TR 4 nodule meets criteria for biopsy as above. This correlates with the MRI finding. The above is in keeping with the ACR TI-RADS recommendations - J Am Coll Radiol 2017;14:587-595. Electronically Signed   By: Jerilynn Mages.  Shick M.D.   On: 08/02/2019 08:16   Health Maintenance Due  Topic Date Due  . INFLUENZA VACCINE  07/27/2019   Depression screen [facility] 2/9 07/12/2019 04/25/2018 04/06/2017  Decreased Interest 1 0 0  Down, Depressed, Hopeless 1 0 0  PHQ - 2 Score 2 0 0  Altered sleeping 1 1 -  Tired, decreased energy 3 0 -  Change in appetite 0 0 -  Feeling bad or failure about yourself  0 0 -  Trouble concentrating 1 0 -  Moving slowly or fidgety/restless 1 0 -  Suicidal thoughts 0 0 -  PHQ-9 Score 8 1 -  Difficult doing work/chores Somewhat difficult Not difficult at all -   PMHx, SurgHx, SocialHx, FamHx,  Medications, and Allergies were reviewed in the Visit Navigator and updated as appropriate.   Patient Active Problem List   Diagnosis Date Noted  . Abnormal imaging of thyroid 08/04/2019  . Cervicalgia 07/12/2019  . 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 Anxiety about health 07/12/2019  . 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 Change in stool 11/14/2018  . Posterior vitreous detachment of right eye 09/10/2018  . History of IBS 01/30/2018  . Urolithiasis 06/23/2017  . Fibromyalgia 06/04/2017  . Chronic fatigue 06/04/2017  . Medication intolerance 06/04/2017  . ANA positive 05/20/2017  . 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 Serum calcium elevated 05/20/2017  . Interstitial cystitis 05/20/2017  . Dyspepsia 05/20/2017  . Mixed hyperlipidemia 04/06/2017  . Elevated BP without diagnosis of hypertension 04/06/2017  . Anxiety 04/06/2017  . Dry eyes 04/06/2017   Social History   Tobacco Use  . Smoking status: Never Smoker  . Smokeless tobacco: Never Used  Substance Use Topics  . Alcohol use: No  . Drug use: No   Current Medications and Allergies   Current Outpatient Medications:  .  acetaminophen (TYLENOL) 500 MG tablet, Take 1,000 mg by mouth every 6 (six) hours as needed for headache (pain)., Disp: , Rfl:  .  b complex vitamins capsule, Take 1 capsule by mouth daily., Disp: , Rfl:  .  Calcium & Magnesium Carbonates (MYLANTA PO), Take 20 mLs by mouth as directed. , Disp: , Rfl:  .  Cholecalciferol (VITAMIN D) 2000 units tablet, Take 2,000 Units by mouth daily. , Disp: , Rfl:  .  Cyanocobalamin (VITAMIN B-12 PO), Take by mouth., Disp: , Rfl:  .  docusate sodium (COLACE) 50 MG capsule, Take by mouth., Disp: , Rfl:  .  MAGNESIUM GLUCONATE PO, Take 200 mg by mouth  as directed. , Disp: , Rfl:  .  NON FORMULARY, , Disp: , Rfl:  .  Polyethyl Glycol-Propyl Glycol (SYSTANE ULTRA OP), Apply to eye 4 (four) times daily., Disp: , Rfl:  .  Simethicone (GAS-X PO), Take by mouth as directed. , Disp:  , Rfl:    Allergies  Allergen Reactions  . Diltiazem Shortness Of Breath  . Metoprolol Shortness Of Breath  . Statins Tinitus    Achy joints, muscle aches Achy joints, muscle aches  . Banana Other (See Comments)    migraine  . Chocolate Other (See Comments)    migraine  . Cocoa Other (See Comments)    migraine migraine  . Codeine Nausea And Vomiting  . Cranberry Other (See Comments)    Cystitis  . Latex Rash  . Monosodium Glutamate Other (See Comments)    Migraine  . Penicillins Rash    Has patient had a PCN reaction causing immediate rash, facial/tongue/throat swelling, SOB or lightheadedness with hypotension: Yes Has patient had a PCN reaction causing severe rash involving mucus membranes or skin necrosis: No Has patient had a PCN reaction that required hospitalization No Has patient had a PCN reaction occurring within the last 10 years: No If all of the above answers are "NO", then may proceed with Cephalosporin use.   Marland Kitchen Antihistamines, Loratadine-Type     Throat swelling  . Carvedilol Other (See Comments)    N&N, SOB , Head ache and joint pain.   . Eggs Or Egg-Derived Products   . Ginger   . Gluten Meal   . Lexapro [Escitalopram]     GI   . Montelukast Other (See Comments)    Throat swelling  . Other Swelling    Throat swelling  . Prednisone Swelling    Throat swelling (no difficulty breathing)  . Sulfites Other (See Comments)    Nausea and headaches  . Vanilla   . Whey   . Zofran [Ondansetron Hcl] Other (See Comments)    Muscle cramps, leg tingling  . Azithromycin Other (See Comments) and Diarrhea   Review of Systems   Pertinent items are noted in the HPI. Otherwise, a complete ROS is negative.  Vitals   Vitals:   07/30/19 1333  BP: 120/64  Pulse: 88  Temp: 99 F (37.2 C)  TempSrc: Oral  SpO2: 97%  Weight: 129 lb (58.5 kg)  Height: 5\' 2"  (1.575 m)     Body mass index is 23.59 kg/m.  Physical Exam   Physical Exam Vitals signs and nursing  note reviewed.  HENT:     Head: Normocephalic and atraumatic.  Eyes:     Pupils: Pupils are equal, round, and reactive to light.  Neck:     Musculoskeletal: Normal range of motion and neck supple.  Cardiovascular:     Rate and Rhythm: Normal rate and regular rhythm.     Heart sounds: Normal heart sounds.  Pulmonary:     Effort: Pulmonary effort is normal.  Abdominal:     Palpations: Abdomen is soft.  Skin:    General: Skin is warm.  Psychiatric:        Behavior: Behavior normal.    Results for orders placed or performed in visit on 06/04/19  VITAMIN D 25 Hydroxy (Vit-D Deficiency, Fractures)  Result Value Ref Range   VITD 47.65 30.00 - 100.00 ng/mL  Vitamin B12  Result Value Ref Range   Vitamin B-12 716 211 - 911 pg/mL  TSH  Result Value Ref Range  TSH 0.64 0.35 - 4.50 uIU/mL  T4, free  Result Value Ref Range   Free T4 0.89 0.60 - 1.60 ng/dL  Lipid panel  Result Value Ref Range   Cholesterol 224 (H) 0 - 200 mg/dL   Triglycerides 96.0 0.0 - 149.0 mg/dL   HDL 66.90 >39.00 mg/dL   VLDL 19.2 0.0 - 40.0 mg/dL   LDL Cholesterol 138 (H) 0 - 99 mg/dL   Total CHOL/HDL Ratio 3    NonHDL 156.95   Comprehensive metabolic panel  Result Value Ref Range   Sodium 139 135 - 145 mEq/L   Potassium 4.1 3.5 - 5.1 mEq/L   Chloride 101 96 - 112 mEq/L   CO2 30 19 - 32 mEq/L   Glucose, Bld 98 70 - 99 mg/dL   BUN 21 6 - 23 mg/dL   Creatinine, Ser 0.73 0.40 - 1.20 mg/dL   Total Bilirubin 0.4 0.2 - 1.2 mg/dL   Alkaline Phosphatase 128 (H) 39 - 117 U/L   AST 12 0 - 37 U/L   ALT 10 0 - 35 U/L   Total Protein 7.6 6.0 - 8.3 g/dL   Albumin 4.8 3.5 - 5.2 g/dL   Calcium 10.5 8.4 - 10.5 mg/dL   GFR 79.39 >60.00 mL/min  CBC with Differential/Platelet  Result Value Ref Range   WBC 10.9 (H) 4.0 - 10.5 K/uL   RBC 4.36 3.87 - 5.11 Mil/uL   Hemoglobin 13.7 12.0 - 15.0 g/dL   HCT 40.8 36.0 - 46.0 %   MCV 93.5 78.0 - 100.0 fl   MCHC 33.6 30.0 - 36.0 g/dL   RDW 13.4 11.5 - 15.5 %   Platelets  424.0 (H) 150.0 - 400.0 K/uL   Neutrophils Relative % 81.0 (H) 43.0 - 77.0 %   Lymphocytes Relative 13.3 12.0 - 46.0 %   Monocytes Relative 4.8 3.0 - 12.0 %   Eosinophils Relative 0.0 0.0 - 5.0 %   Basophils Relative 0.9 0.0 - 3.0 %   Neutro Abs 8.8 (H) 1.4 - 7.7 K/uL   Lymphs Abs 1.4 0.7 - 4.0 K/uL   Monocytes Absolute 0.5 0.1 - 1.0 K/uL   Eosinophils Absolute 0.0 0.0 - 0.7 K/uL   Basophils Absolute 0.1 0.0 - 0.1 K/uL   Assessment and Plan   Natalie Erickson was seen today for follow-up.  Diagnoses and all orders for this visit:  Sleep disorder breathing Comments: Patient has follow-up with sleep medicine.  Will follow along.  Cervicalgia Comments: Followed by orthopedics.  She is participating in physical therapy again and feel that it is helpful.  This is been done at [facility].  Anxiety Comments: Will provide names of EMDR practitioners.  Abnormal imaging of thyroid Comments: During current dictation, thyroid imaging available.  We will go ahead and refer to endocrinology.  Underweight Comments: We did discuss the importance of focusing on proteins and healthy fats for maintenance of lean body mass.  Undergoing PT.  Discussed supplements for bone health.  . Orders and follow up as documented in [place], reviewed diet, exercise and weight control, cardiovascular risk and specific lipid/LDL goals reviewed, reviewed medications and side effects in detail.  . Reviewed expectations re: course of current medical issues. . Outlined signs and symptoms indicating need for more acute intervention. . Patient verbalized understanding and all questions were answered. . Patient received an After Visit Summary.  Briscoe Deutscher, DO [place], [place] 08/04/2019

## 2019-08-05 DIAGNOSIS — M546 Pain in thoracic spine: Secondary | ICD-10-CM | POA: Diagnosis not present

## 2019-08-05 DIAGNOSIS — M47892 Other spondylosis, cervical region: Secondary | ICD-10-CM | POA: Diagnosis not present

## 2019-08-06 ENCOUNTER — Telehealth: Payer: Self-pay | Admitting: Family Medicine

## 2019-08-06 NOTE — Telephone Encounter (Signed)
The patient reports increased discomfort in her neck/head. She has had 2 episodes of vertigo after PT. Most recently, she had an episode of this after yesterday's PT session. She felt like she would "pass out." She did not, though. This morning, she still feels "uncomfortable" with "cotton-head, headachy & anxious." She wants to know if she should stop the PT.

## 2019-08-06 NOTE — Telephone Encounter (Signed)
She can stop PT if it's making her worse.  Other options include epidural steroid injection or surgical consult.

## 2019-08-06 NOTE — Telephone Encounter (Signed)
Patient left a voicemail message wanting to speak to Dr. Junius Roads in regards to her PT.  She stated that she has noticed some increase in symptoms and wanted to know if Dr. Junius Roads wanted her to continue with the PT.  CB#[phone].  Thank you.

## 2019-08-06 NOTE — Telephone Encounter (Signed)
I called and advised the patient. She will cancel her upcoming PT appointments. She said she will let us know if she does not improve and how she would like to proceed [facility] or surgical consult).

## 2019-08-08 ENCOUNTER — Telehealth: Payer: Self-pay

## 2019-08-08 NOTE — Telephone Encounter (Signed)
Called to offer pt follow up appt. Pt stated that she did not feel the need to make an appt as she is dealing with orthopedic neck pain and is seeking treatment. Pt states she will reach back out when this issue is all cleared up.

## 2019-08-09 ENCOUNTER — Other Ambulatory Visit: Payer: Self-pay

## 2019-08-09 ENCOUNTER — Ambulatory Visit: Payer: Self-pay | Admitting: Family Medicine

## 2019-08-09 ENCOUNTER — Ambulatory Visit (INDEPENDENT_AMBULATORY_CARE_PROVIDER_SITE_OTHER): Payer: PPO | Admitting: Family Medicine

## 2019-08-09 ENCOUNTER — Other Ambulatory Visit: Payer: PPO

## 2019-08-09 DIAGNOSIS — M549 Dorsalgia, unspecified: Secondary | ICD-10-CM | POA: Diagnosis not present

## 2019-08-09 DIAGNOSIS — M797 Fibromyalgia: Secondary | ICD-10-CM

## 2019-08-09 DIAGNOSIS — R5383 Other fatigue: Secondary | ICD-10-CM

## 2019-08-09 LAB — SEDIMENTATION RATE: Sed Rate: 17 mm/hr (ref 0–30)

## 2019-08-09 NOTE — Telephone Encounter (Signed)
Pt. Reports she has been feeling bad for "weeks." Dizziness, weakness, nausea, "numbness in the back of my head." Sees orthopedic doctor for "neck issues, and I told him about this." BP this morning 124/67 and pulse 67. No fever. "Maybe it's my allergies." Warm transfer to [facility] in the practice.  Answer Assessment - Initial Assessment Questions 1. DESCRIPTION: "Describe your dizziness."     Dizzy 2. LIGHTHEADED: "Do you feel lightheaded?" (e.g., somewhat faint, woozy, weak upon standing)     Weak with standing 3. VERTIGO: "Do you feel like either you or the room is spinning or tilting?" (i.e. vertigo)     No 4. SEVERITY: "How bad is it?"  "Do you feel like you are going to faint?" "Can you stand and walk?"   - MILD - walking normally   - MODERATE - interferes with normal activities (e.g., work, school)    - SEVERE - unable to stand, requires support to walk, feels like passing out now.      Mild 5. ONSET:  "When did the dizziness begin?"     Started a few weeks ago 6. AGGRAVATING FACTORS: "Does anything make it worse?" (e.g., standing, change in head position)     Standing 7. HEART RATE: "Can you tell me your heart rate?" "How many beats in 15 seconds?"  (Note: not all patients can do this)       67 8. CAUSE: "What do you think is causing the dizziness?"     Unsure 9. RECURRENT SYMPTOM: "Have you had dizziness before?" If so, ask: "When was the last time?" "What happened that time?"     No 10. OTHER SYMPTOMS: "Do you have any other symptoms?" (e.g., fever, chest pain, vomiting, diarrhea, bleeding)       Nausea 11. PREGNANCY: "Is there any chance you are pregnant?" "When was your last menstrual period?"       No  Protocols used: DIZZINESS [facility]

## 2019-08-09 NOTE — Progress Notes (Signed)
Patient ID: Natalie Erickson, female   DOB: 08/27/1951, 68 y.o.   MRN: [id number]  This visit type was conducted due to national recommendations for restrictions regarding the COVID-19 pandemic in an effort to limit this patient's exposure and mitigate transmission in our community.   Virtual Visit via Video Note  I connected with Natalie Erickson on 08/09/19 at 11:45 AM EDT by a video enabled telemedicine application and verified that I am speaking with the correct person using two identifiers.  Location patient: home Location provider:work or home office Persons participating in the virtual visit: patient, provider  I discussed the limitations of evaluation and management by telemedicine and the availability of in person appointments. The patient expressed understanding and agreed to proceed.   HPI: Patient called with multiple nonspecific complaints.  She has history of vasomotor rhinitis, interstitial cystitis, IBS, chronic anxiety, fibromyalgia and somatization disorder.  She has some chronic nausea but states this is unchanged.  She had some recent physical therapy and had a couple episodes of vertigo following that and PT was discontinued after episode Monday.  No vertigo since then.  Her major complaint is tingling sensation posterior occipital area and sensation of feeling "tight ".  She has some pressure which extends behind both eyes.  Some neck and upper back pain.  Prescribed baclofen previously but intolerant of very low doses of baclofen.  She has 25 medications listed on her drug allergies as intolerance or allergy  She had some recent bilateral tinnitus.  No reported hearing loss.  No pulsatile or roaring tinnitus.  She has some generalized fatigue and weakness which sounds to be more chronic.  Recent mild hoarseness.  Frequent postnasal drip symptoms.  Intolerant of Flonase and antihistamines previously  Denies any headache.  She does have some achiness both shoulders upper back and neck  which she thinks is progressive some loss of appetite.  No reported fever   ROS: See pertinent positives and negatives per HPI.  Past Medical History:  Diagnosis Date  . Anxiety 04/06/2017  . Cystitis   . Depression   . Dry eyes 04/06/2017  . Elevated BP without diagnosis of hypertension 04/06/2017   lost weight >> BP improved  . Fibromyalgia   . Heart murmur    hx of 2 Echocardiograms in [place] (pt was told they were normal)  . Hiatal hernia   . History of colon polyps   . History of echocardiogram    Echo 9/19: mild LVH, EF 60-65, no RWMA, Gr 1 DD, trivial MR/TR, PASP 16  . History of Lyme disease   . IBS (irritable bowel syndrome)   . Mixed hyperlipidemia 04/06/2017   intol to statins due to myalgias; never tried Zetia  . TMJ (dislocation of temporomandibular joint)   . Vasomotor rhinitis     Past Surgical History:  Procedure Laterality Date  . APPENDECTOMY  1959  . LAPAROSCOPY    . LEFT OOPHORECTOMY      Family History  Problem Relation Age of Onset  . Breast cancer Sister   . Diabetes Paternal Grandmother   . Hypertension Mother   . Lung cancer Father   . Colon cancer Neg Hx   . Stomach cancer Neg Hx   . Heart attack Neg Hx   . Heart failure Neg Hx     SOCIAL HX: Retired Marine scientist.  Non-smoker   Current Outpatient Medications:  .  acetaminophen (TYLENOL) 500 MG tablet, Take 1,000 mg by mouth every 6 (six) hours as needed for  headache (pain)., Disp: , Rfl:  .  b complex vitamins capsule, Take 1 capsule by mouth daily., Disp: , Rfl:  .  Calcium & Magnesium Carbonates (MYLANTA PO), Take 20 mLs by mouth as directed. , Disp: , Rfl:  .  Cholecalciferol (VITAMIN D) 2000 units tablet, Take 2,000 Units by mouth daily. , Disp: , Rfl:  .  Cyanocobalamin (VITAMIN B-12 PO), Take by mouth., Disp: , Rfl:  .  docusate sodium (COLACE) 50 MG capsule, Take by mouth., Disp: , Rfl:  .  MAGNESIUM GLUCONATE PO, Take 200 mg by mouth as directed. , Disp: , Rfl:  .  NON FORMULARY,  , Disp: , Rfl:  .  Polyethyl Glycol-Propyl Glycol (SYSTANE ULTRA OP), Apply to eye 4 (four) times daily., Disp: , Rfl:  .  Simethicone (GAS-X PO), Take by mouth as directed. , Disp: , Rfl:   EXAM:  VITALS per patient if applicable: Blood pressure 128/75, pulse 79, temperature 97.8  GENERAL: alert, oriented, appears well and in no acute distress  HEENT: atraumatic, conjunttiva clear, no obvious abnormalities on inspection of external nose and ears  NECK: normal movements of the head and neck  LUNGS: on inspection no signs of respiratory distress, breathing rate appears normal, no obvious gross SOB, gasping or wheezing  CV: no obvious cyanosis  MS: moves all visible extremities without noticeable abnormality  PSYCH/NEURO: pleasant and cooperative, no obvious depression or anxiety, speech and thought processing grossly intact  ASSESSMENT AND PLAN:  Discussed the following assessment and plan:  Multiple somatic complaints-it appears most these are fairly chronic.  She does describe some recent progressive neck and upper back pain and stiffness.  Rule out PMR.  She does have reported fibromyalgia and increased somatization which makes assessment challenging  -Check sed rate -Reluctant to try any low-dose muscle relaxers given her history of multiple intolerance. -Continue conservative treatments with heat and topical muscle rubs -Continue physical therapy as tolerated   I discussed the assessment and treatment plan with the patient. The patient was provided an opportunity to ask questions and all were answered. The patient agreed with the plan and demonstrated an understanding of the instructions.   The patient was advised to call back or seek an in-person evaluation if the symptoms worsen or if the condition fails to improve as anticipated.    Carolann Littler, MD

## 2019-08-09 NOTE — Telephone Encounter (Signed)
Patient has a Doxy appointment today at 11:45am.

## 2019-08-09 NOTE — Telephone Encounter (Signed)
See note. Patient is schedule for appointment with Dr. Elease Hashimoto.

## 2019-08-12 ENCOUNTER — Other Ambulatory Visit: Payer: Self-pay | Admitting: Family Medicine

## 2019-08-12 ENCOUNTER — Encounter: Payer: Self-pay | Admitting: Neurology

## 2019-08-12 ENCOUNTER — Other Ambulatory Visit: Payer: Self-pay

## 2019-08-12 ENCOUNTER — Ambulatory Visit (INDEPENDENT_AMBULATORY_CARE_PROVIDER_SITE_OTHER): Payer: PPO | Admitting: Neurology

## 2019-08-12 VITALS — BP 125/78 | HR 73 | Ht 62.0 in | Wt 130.0 lb

## 2019-08-12 DIAGNOSIS — R0683 Snoring: Secondary | ICD-10-CM | POA: Diagnosis not present

## 2019-08-12 DIAGNOSIS — G4719 Other hypersomnia: Secondary | ICD-10-CM | POA: Diagnosis not present

## 2019-08-12 DIAGNOSIS — R519 Headache, unspecified: Secondary | ICD-10-CM

## 2019-08-12 DIAGNOSIS — R51 Headache: Secondary | ICD-10-CM | POA: Diagnosis not present

## 2019-08-12 DIAGNOSIS — R351 Nocturia: Secondary | ICD-10-CM

## 2019-08-12 DIAGNOSIS — E041 Nontoxic single thyroid nodule: Secondary | ICD-10-CM

## 2019-08-12 NOTE — Progress Notes (Signed)
Subjective:    Patient ID: Natalie Erickson is a 68 y.o. female.  HPI     Natalie Age, Natalie Erickson, Natalie Erickson [facility] [address] [place], [place] [address]  Dear Dr. Juleen China,   I saw your patient, Natalie Erickson, upon your kind request to my sleep clinic today for initial consultation of her sleep disorder, in particular, concern for sleep disordered breathing.  The patient is unaccompanied today.  As you know, Natalie Erickson is a 68 year old right-handed woman with an underlying medical history of irritable bowel syndrome, history of heart murmur, fibromyalgia, depression, anxiety, and hyperlipidemia, who reports snoring and excessive daytime somnolence.  I reviewed your office note from 07/12/2019.  She has had some morning headaches.  These are dull and achy.  She had seen Dr. Jaynee Eagles in our office in 2018 for headaches. Her Epworth sleepiness score is 9 out of 24, fatigue severity score is 52 out of 63.  She reports that her husband has noted some snorting sounds, these became worse when she was tried on baclofen for neck pain.  She had a recent C-spine MRI and x-ray in July 2020.  She brought copies of the results for my review.  Her cervical spine x-ray showed multilevel degenerative disc disease.  Her MRI neck showed multilevel cervical spondylosis, most pronounced at C5-6.  She had mild canal stenosis and moderate left foraminal narrowing she was noted to have a goiter.  She is going to see a new orthopedic doctor this week. She reports that she is supposed to have a thyroid biopsy.  She has had some depressive symptoms.  She reports side effects on antidepressant medications.  She has intermittent restless leg symptoms and takes magnesium for these symptoms as well as cramping.  She does not have nightly restless legs symptoms.  She does have nocturia once or twice per average night.  She lives with her husband.  They live with their son and his family including wife and 2  grandchildren.  She has another son in [place].  Her son that lives here has sleep apnea and has a CPAP machine.  She reports a bedtime around 930 and rise time between 630 and 7 AM.  Years ago she tried a custom-made bite guard which broke.  She did not think it helped.  She does not believe it was made for snoring or for sleep apnea but more for grinding.  She does not drink caffeine or alcohol.  She does not smoke.  Her Past Medical History Is Significant For: Past Medical History:  Diagnosis Date  . Anxiety 04/06/2017  . Cystitis   . Depression   . Dry eyes 04/06/2017  . Elevated BP without diagnosis of hypertension 04/06/2017   lost weight >> BP improved  . Fibromyalgia   . Heart murmur    hx of 2 Echocardiograms in [place] (pt was told they were normal)  . Hiatal hernia   . History of colon polyps   . History of echocardiogram    Echo 9/19: mild LVH, EF 60-65, no RWMA, Gr 1 DD, trivial MR/TR, PASP 16  . History of Lyme disease   . IBS (irritable bowel syndrome)   . Mixed hyperlipidemia 04/06/2017   intol to statins due to myalgias; never tried Zetia  . TMJ (dislocation of temporomandibular joint)   . Vasomotor rhinitis     Her Past Surgical History Is Significant For: Past Surgical History:  Procedure Laterality Date  . APPENDECTOMY  [place]    . LEFT OOPHORECTOMY      Her Family History Is Significant For: Family History  Problem Relation Erickson of Onset  . Breast cancer Sister   . Diabetes Paternal Grandmother   . Hypertension Mother   . Lung cancer Father   . Colon cancer Neg Hx   . Stomach cancer Neg Hx   . Heart attack Neg Hx   . Heart failure Neg Hx     Her Social History Is Significant For: Social History   Socioeconomic History  . Marital status: Married    Spouse name: Not on file  . Number of children: 2  . Years of education: Therapist, sports  . Highest education level: Not on file  Occupational History  . Occupation: Retired  Scientific laboratory technician   . Financial resource strain: Not on file  . Food insecurity    Worry: Not on file    Inability: Not on file  . Transportation needs    Medical: Not on file    Non-medical: Not on file  Tobacco Use  . Smoking status: Never Smoker  . Smokeless tobacco: Never Used  Substance and Sexual Activity  . Alcohol use: No  . Drug use: No  . Sexual activity: Yes  Lifestyle  . Physical activity    Days per week: Not on file    Minutes per session: Not on file  . Stress: Not on file  Relationships  . Social Herbalist on phone: Not on file    Gets together: Not on file    Attends religious service: Not on file    Active member of club or organization: Not on file    Attends meetings of clubs or organizations: Not on file    Relationship status: Not on file  Other Topics Concern  . 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 Diltiazem Shortness Of Breath  . Metoprolol Shortness Of Breath  . Statins Tinitus    Achy joints, muscle aches Achy joints, muscle aches  . Banana Other (See Comments)    migraine  . Chocolate Other (See Comments)    migraine  . Cocoa Other (See Comments)    migraine migraine  . Codeine Nausea And Vomiting  . Cranberry Other (See Comments)    Cystitis  . Latex Rash  . Monosodium Glutamate Other (See Comments)    Migraine  . Penicillins Rash    Has patient had a PCN reaction causing immediate rash, facial/tongue/throat swelling, SOB or lightheadedness with hypotension: Yes Has patient had a PCN reaction causing severe rash involving mucus membranes or skin necrosis: No Has patient had a PCN reaction that required hospitalization No Has patient had a PCN reaction occurring within the last 10 years: No If all of the above answers are "NO",  then may proceed with Cephalosporin use.   Marland Kitchen Antihistamines, Loratadine-Type     Throat swelling  . Carvedilol Other (See Comments)    N&N, SOB , Head ache and joint pain.   . Eggs Or Egg-Derived Products   . Ginger   . Gluten Meal   . Lexapro [Escitalopram]     GI   . Montelukast Other (See  Comments)    Throat swelling  . Other Swelling    Throat swelling  . Prednisone Swelling    Throat swelling (no difficulty breathing)  . Sulfites Other (See Comments)    Nausea and headaches  . Vanilla   . Whey   . Zofran [Ondansetron Hcl] Other (See Comments)    Muscle cramps, leg tingling  . Azithromycin Other (See Comments) and Diarrhea  :   Her Current Medications Are:  Outpatient Encounter Medications as of 08/12/2019  Medication Sig  . acetaminophen (TYLENOL) 500 MG tablet Take 1,000 mg by mouth every 6 (six) hours as needed for headache (pain).  Marland Kitchen b complex vitamins capsule Take 1 capsule by mouth daily.  . Cholecalciferol (VITAMIN D) 2000 units tablet Take 2,000 Units by mouth daily.   . Cyanocobalamin (VITAMIN B-12 PO) Take by mouth.  . docusate sodium (COLACE) 50 MG capsule Take by mouth.  Marland Kitchen MAGNESIUM GLUCONATE PO Take 200 mg by mouth as directed.   . NON FORMULARY   . Polyethyl Glycol-Propyl Glycol (SYSTANE ULTRA OP) Apply to eye 4 (four) times daily.  . Simethicone (GAS-X PO) Take by mouth as directed.   . [DISCONTINUED] Calcium & Magnesium Carbonates (MYLANTA PO) Take 20 mLs by mouth as directed.    No facility-administered encounter medications on file as of 08/12/2019.   :  Review of Systems:  Out of a complete 14 point review of systems, all are reviewed and negative with the exception of these symptoms as listed below: Review of Systems  Neurological:       Pt presents today to discuss her sleep. Pt has never had a sleep study but does endorse snoring.  Epworth Sleepiness Scale 0= would never doze 1= slight chance of dozing 2= moderate chance of dozing 3= high  chance of dozing  Sitting and reading: 3 Watching TV: 3 Sitting inactive in a public place (ex. Theater or meeting): 0 As a passenger in a car for an hour without a break: 0 Lying down to rest in the afternoon: 3 Sitting and talking to someone: 0 Sitting quietly after lunch (no alcohol): 0 In a car, while stopped in traffic: 0 Total: 9     Objective:  Neurological Exam  Physical Exam Physical Examination:   Vitals:   08/12/19 1055  BP: 125/78  Pulse: 73    General Examination: The patient is a very pleasant 68 y.o. female in no acute distress. She appears well-developed and well-nourished and well groomed.   HEENT: Normocephalic, atraumatic, pupils are equal, round and reactive to light and accommodation. Funduscopic exam is normal with sharp disc margins noted. Extraocular tracking is good without limitation to gaze excursion or nystagmus noted. Normal smooth pursuit is noted. Hearing is grossly intact. Tympanic membranes are clear bilaterally. Face is symmetric with normal facial animation and normal facial sensation. Speech is clear with no dysarthria noted. There is no hypophonia. There is no lip, neck/head, jaw or voice tremor. Neck is supple with full range of passive and active motion. There are no carotid bruits on auscultation. Oropharynx exam reveals: Moderate mouth dryness, adequate dental hygiene, moderate airway crowding secondary to larger uvula and redundant soft palate, tonsils are small,?  Absent, Mallampati class II.  Tongue protrudes centrally in palate elevates symmetrically, neck circumference is 13-1/4 inches.  She has a mild to moderate overbite.  Chest: Clear to auscultation without wheezing, rhonchi or crackles noted.  Heart: S1+S2+0, regular and normal without murmurs, rubs or gallops noted.  Abdomen: Soft, non-tender and non-distended with normal bowel sounds appreciated on auscultation.  Extremities: There is no pitting edema in the distal lower  extremities bilaterally. Pedal pulses are intact.  Skin: Warm and dry without trophic changes noted.  Musculoskeletal: exam reveals no obvious joint deformities, tenderness or joint swelling or erythema.   Neurologically:  Mental status: The patient is awake, alert and oriented in all 4 spheres. Her immediate and remote memory, attention, language skills and fund of knowledge are appropriate. There is no evidence of aphasia, agnosia, apraxia or anomia. Speech is clear with normal prosody and enunciation. Thought process is linear. Mood is normal and affect is normal.  Cranial nerves II - XII are as described above under HEENT exam. In addition: shoulder shrug is normal with equal shoulder height noted. Motor exam: Normal bulk, strength and tone is noted. There is no drift, tremor or rebound. Romberg is negative. Reflexes are 2-3+. Fine motor skills and coordination: intact grossly.  Cerebellar testing: No dysmetria or intention tremor on finger to nose testing. Heel to shin is unremarkable bilaterally. There is no truncal or gait ataxia.  Sensory exam: intact to light touch.  Gait, station and balance: She stands easily. No veering to one side is noted. No leaning to one side is noted. Posture is Erickson-appropriate and stance is narrow based. Gait shows normal stride length and normal pace. No problems turning are noted. Tandem walk is Slightly challenging for her.   Assessment and Plan:  In summary, Natalie Erickson is a very pleasant 68 y.o.-year old female  with an underlying medical history of irritable bowel syndrome, history of heart murmur, fibromyalgia, depression, anxiety, and hyperlipidemia, whose history and physical exam are concerning for obstructive sleep apnea (OSA). I had a long chat with the patient about my findings and the diagnosis of OSA, its prognosis and treatment options. We talked about medical treatments, surgical interventions and non-pharmacological approaches. I explained in  particular the risks and ramifications of untreated moderate to severe OSA, especially with respect to developing cardiovascular disease down the Road, including congestive heart failure, difficult to treat hypertension, cardiac arrhythmias, or stroke. Even type 2 diabetes has, in part, been linked to untreated OSA. Symptoms of untreated OSA include daytime sleepiness, memory problems, mood irritability and mood disorder such as depression and anxiety, lack of energy, as well as recurrent headaches, especially morning headaches. We talked about trying to maintain a healthy lifestyle in general, as well as the importance of weight control. I encouraged the patient to eat healthy, exercise daily and keep well hydrated, to keep a scheduled bedtime and wake time routine, to not skip any meals and eat healthy snacks in between meals. I advised the patient not to drive when feeling sleepy. I recommended the following at this time: sleep study.   I explained the sleep test procedure to the patient and also outlined possible surgical and non-surgical treatment options of OSA, including the use of a custom-made dental device (which would require a referral to a specialist dentist or oral surgeon), upper airway surgical options, such as pillar implants, radiofrequency surgery, tongue base surgery, and UPPP (which would involve a referral to an ENT surgeon). Rarely, jaw surgery such as mandibular advancement may be considered.  I also explained the CPAP treatment option to the patient, who indicated that she would be willing to try CPAP if the need arises. I explained the importance of being compliant with PAP treatment, not only for insurance purposes but primarily to improve  Her symptoms, and for the patient's long term health benefit, including to reduce Her cardiovascular risks. I answered all her questions today and the patient was in agreement. I plan to see her back after the sleep study is completed and encouraged  her to call with any interim questions, concerns, problems or updates.   Thank you very much for allowing me to participate in the care of this nice patient. If I can be of any further assistance to you please do not hesitate to call me at [phone].  Sincerely,   Natalie Age, Natalie Erickson, Natalie Erickson

## 2019-08-12 NOTE — Patient Instructions (Signed)

## 2019-08-16 DIAGNOSIS — M542 Cervicalgia: Secondary | ICD-10-CM | POA: Diagnosis not present

## 2019-08-19 DIAGNOSIS — M791 Myalgia, unspecified site: Secondary | ICD-10-CM | POA: Diagnosis not present

## 2019-08-19 DIAGNOSIS — M9902 Segmental and somatic dysfunction of thoracic region: Secondary | ICD-10-CM | POA: Diagnosis not present

## 2019-08-19 DIAGNOSIS — M9901 Segmental and somatic dysfunction of cervical region: Secondary | ICD-10-CM | POA: Diagnosis not present

## 2019-08-22 DIAGNOSIS — M9901 Segmental and somatic dysfunction of cervical region: Secondary | ICD-10-CM | POA: Diagnosis not present

## 2019-08-22 DIAGNOSIS — M791 Myalgia, unspecified site: Secondary | ICD-10-CM | POA: Diagnosis not present

## 2019-08-22 DIAGNOSIS — M9902 Segmental and somatic dysfunction of thoracic region: Secondary | ICD-10-CM | POA: Diagnosis not present

## 2019-08-26 ENCOUNTER — Ambulatory Visit (INDEPENDENT_AMBULATORY_CARE_PROVIDER_SITE_OTHER): Payer: PPO | Admitting: Family Medicine

## 2019-08-26 ENCOUNTER — Other Ambulatory Visit: Payer: Self-pay

## 2019-08-26 ENCOUNTER — Encounter: Payer: Self-pay | Admitting: Family Medicine

## 2019-08-26 VITALS — BP 120/67 | HR 100 | Temp 98.4°F | Wt 128.0 lb

## 2019-08-26 DIAGNOSIS — Z8719 Personal history of other diseases of the digestive system: Secondary | ICD-10-CM

## 2019-08-26 DIAGNOSIS — K5904 Chronic idiopathic constipation: Secondary | ICD-10-CM

## 2019-08-26 DIAGNOSIS — R51 Headache: Secondary | ICD-10-CM | POA: Diagnosis not present

## 2019-08-26 DIAGNOSIS — F339 Major depressive disorder, recurrent, unspecified: Secondary | ICD-10-CM | POA: Diagnosis not present

## 2019-08-26 DIAGNOSIS — Z789 Other specified health status: Secondary | ICD-10-CM

## 2019-08-26 DIAGNOSIS — R9389 Abnormal findings on diagnostic imaging of other specified body structures: Secondary | ICD-10-CM | POA: Diagnosis not present

## 2019-08-26 DIAGNOSIS — R519 Headache, unspecified: Secondary | ICD-10-CM

## 2019-08-26 DIAGNOSIS — G8929 Other chronic pain: Secondary | ICD-10-CM

## 2019-08-26 MED ORDER — DICYCLOMINE HCL 10 MG PO CAPS: 10.0000 mg | ORAL_CAPSULE | Freq: Three times a day (TID) | ORAL | 0 refills | Status: DC

## 2019-08-26 MED ORDER — CITALOPRAM HYDROBROMIDE 10 MG PO TABS: ORAL_TABLET | ORAL | 1 refills | Status: DC

## 2019-08-26 NOTE — Patient Instructions (Addendum)
I know that you are dealing with a lot right now to just remember to take one day at a time.  For your pain, I would like for you to consider calling [facility] as this is an integrative pain management place.  They have an offer for a complementary first visit.   For your depression, I recommend a psychiatrist that can work with refractory depression.  Dr. Daron Offer in [place] is highly recommended and does transcra  Continue to see your psychologist, Brett Fairy.  Go ahead and call for the sleep study with Dr. Rexene Alberts.  You will like our endocrinologist. Dr. Kelton Pillar.  I can get you back to neurology for the chronic headache.  I am prescribing Celexa today as discussed.  Also prescribing Bentyl to be used as needed.  Let us know if you have any questions or concerns.

## 2019-08-26 NOTE — Progress Notes (Signed)
Virtual Visit via Video   Due to the COVID-19 pandemic, this visit was completed with telemedicine (audio/video) technology to reduce patient and provider exposure as well as to preserve personal protective equipment.   I connected with Natalie Erickson by a video enabled telemedicine application and verified that I am speaking with the correct person using two identifiers. Location patient: Home Location provider: [facility], Office Persons participating in the virtual visit: Natalie Erickson, Briscoe Deutscher, DO [name], CMA acting as scribe for Dr. Briscoe Deutscher.   I discussed the limitations of evaluation and management by telemedicine and the availability of in person appointments. The patient expressed understanding and agreed to proceed.  Care Team   Patient Care Team: Briscoe Deutscher, DO as PCP - General (Family Medicine) [name], MD as PCP - Cardiology (Cardiology) Loletha Carrow Kirke Corin, MD as Consulting Physician (Gastroenterology) Murrell Redden Earlyne Iba, MD as Consulting Physician (Obstetrics and Gynecology) Hortencia Pilar, MD as Consulting Physician (Surgery) Lyndee Hensen, PT as Physical Therapist (Physical Therapy) Kennith Center, RD as Dietitian (Family Medicine)  Subjective:   HPI: Patient complains of chronic worsening headache from the occiput to forehead.  She is seen a few orthopedist now for neck pain.  She is working with Restaurant manager, fast food as well.  More recently, she met with a integrative medicine psychologist.  The psychologist did recommend treatment for her severe depression.  Patient has tried Lexapro, BuSpar, amitriptyline with side effects.  She did her own homework and is interested in trialing Celexa.  Xanax is used as needed at one half of the 0.25 tab with relief of her anxiety and no side effects.  She is in the process of scheduling a sleep study through Dr. Rexene Alberts, neurology.  An incidental finding of left thyroid nodule is also being worked up and  the patient has an appointment with endocrinology soon.  A biopsy has been recommended.  She continues to have intermittent abdominal cramping and constipation.  She is using slippery elm for treatment.  She also uses DGL licorice.  Previously, we tried simethicone, Metamucil, MiraLAX, Benefiber, IBgard.  She had reactions all medications.  She feels more weak.  But continues to exercise.  Review of Systems  Constitutional: Negative for chills and fever.  HENT: Negative for ear pain and hearing loss.   Eyes: Negative for blurred vision and double vision.  Respiratory: Negative for cough and wheezing.   Cardiovascular: Negative for chest pain, palpitations and leg swelling.  Gastrointestinal: Positive for constipation, heartburn and nausea. Negative for vomiting.  Genitourinary: Negative for dysuria and urgency.  Musculoskeletal: Positive for neck pain. Negative for myalgias.  Neurological: Positive for dizziness and headaches.  Psychiatric/Behavioral: Positive for depression. Negative for suicidal ideas.    Patient Active Problem List   Diagnosis Date Noted  . Abnormal imaging of thyroid 08/04/2019  . Cervicalgia 07/12/2019  . 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 Anxiety about health 07/12/2019  . 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 Change in stool 11/14/2018  . Posterior vitreous detachment of right eye 09/10/2018  . History of IBS 01/30/2018  . Urolithiasis 06/23/2017  . Fibromyalgia 06/04/2017  . Chronic fatigue 06/04/2017  . Medication intolerance 06/04/2017  . ANA positive 05/20/2017  . 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 Serum calcium elevated 05/20/2017  . Interstitial cystitis 05/20/2017  . Dyspepsia 05/20/2017  . Mixed hyperlipidemia 04/06/2017  . Elevated BP without diagnosis of hypertension 04/06/2017  . Anxiety 04/06/2017  . Dry eyes 04/06/2017    Social History  Tobacco Use  . Smoking status: Never Smoker  . Smokeless tobacco: Never Used  Substance Use Topics    . Alcohol use: No    Current Outpatient Medications:  .  acetaminophen (TYLENOL) 500 MG tablet, Take 1,000 mg by mouth every 6 (six) hours as needed for headache (pain)., Disp: , Rfl:  .  Cholecalciferol (VITAMIN D) 2000 units tablet, Take 2,000 Units by mouth daily. , Disp: , Rfl:  .  MAGNESIUM GLUCONATE PO, Take 200 mg by mouth as directed. , Disp: , Rfl:  .  Polyethyl Glycol-Propyl Glycol (SYSTANE ULTRA OP), Apply to eye 4 (four) times daily., Disp: , Rfl:  .  citalopram (CELEXA) 10 MG tablet, 1/2 tab daily, Disp: 30 tablet, Rfl: 1 .  dicyclomine (BENTYL) 10 MG capsule, Take 1 capsule (10 mg total) by mouth 4 (four) times daily -  before meals and at bedtime., Disp: 24 capsule, Rfl: 0  Allergies  Allergen Reactions  . Diltiazem Shortness Of Breath  . Metoprolol Shortness Of Breath  . Statins Tinitus    Achy joints, muscle aches Achy joints, muscle aches  . Banana Other (See Comments)    migraine  . Chocolate Other (See Comments)    migraine  . Cocoa Other (See Comments)    migraine migraine  . Codeine Nausea And Vomiting  . Cranberry Other (See Comments)    Cystitis  . Latex Rash  . Monosodium Glutamate Other (See Comments)    Migraine  . Penicillins Rash    Has patient had a PCN reaction causing immediate rash, facial/tongue/throat swelling, SOB or lightheadedness with hypotension: Yes Has patient had a PCN reaction causing severe rash involving mucus membranes or skin necrosis: No Has patient had a PCN reaction that required hospitalization No Has patient had a PCN reaction occurring within the last 10 years: No If all of the above answers are "NO", then may proceed with Cephalosporin use.   Marland Kitchen Antihistamines, Loratadine-Type     Throat swelling  . Carvedilol Other (See Comments)    N&N, SOB , Head ache and joint pain.   . Eggs Or Egg-Derived Products   . Ginger   . Gluten Meal   . Lexapro [Escitalopram]     GI   . Montelukast Other (See Comments)    Throat  swelling  . Other Swelling    Throat swelling  . Prednisone Swelling    Throat swelling (no difficulty breathing)  . Sulfites Other (See Comments)    Nausea and headaches  . Vanilla   . Whey   . Zofran [Ondansetron Hcl] Other (See Comments)    Muscle cramps, leg tingling  . Azithromycin Other (See Comments) and Diarrhea   Objective:   VITALS: Per patient if applicable, see vitals. GENERAL: Alert, appears well and in no acute distress. HEENT: Atraumatic, conjunctiva clear, no obvious abnormalities on inspection of external nose and ears. NECK: Normal movements of the head and neck. CARDIOPULMONARY: No increased WOB. Speaking in clear sentences. I:E ratio WNL.  MS: Moves all visible extremities without noticeable abnormality. PSYCH: Pleasant and cooperative, well-groomed. Speech normal rate and rhythm. Affect is appropriate. Attention is focused, linear, and appropriate.  NEURO: CN grossly intact. Oriented as arrived to appointment on time with no prompting. Moves both UE equally.  SKIN: No obvious lesions, wounds, erythema, or cyanosis noted on face or hands.  Depression screen [facility] 2/9 07/12/2019 04/25/2018 04/06/2017  Decreased Interest 1 0 0  Down, Depressed, Hopeless 1 0 0  PHQ - 2  Score 2 0 0  Altered sleeping 1 1 -  Tired, decreased energy 3 0 -  Change in appetite 0 0 -  Feeling bad or failure about yourself  0 0 -  Trouble concentrating 1 0 -  Moving slowly or fidgety/restless 1 0 -  Suicidal thoughts 0 0 -  PHQ-9 Score 8 1 -  Difficult doing work/chores Somewhat difficult Not difficult at all -    Assessment and Plan:   Oanh was seen today for follow-up.  Diagnoses and all orders for this visit:  Chronic nonintractable headache, unspecified headache type -     Ambulatory referral to Neurology  History of IBS -     dicyclomine (BENTYL) 10 MG capsule; Take 1 capsule (10 mg total) by mouth 4 (four) times daily -  before meals and at bedtime.  Abnormal imaging of  thyroid  Medication intolerance  Chronic idiopathic constipation  Depression, recurrent (HCC) -     citalopram (CELEXA) 10 MG tablet; 1/2 tab daily  Please see AVS for information discussed at visit.  Marland Kitchen COVID-19 Education: The signs and symptoms of COVID-19 were discussed with the patient and how to seek care for testing if needed. The importance of social distancing was discussed today. . Reviewed expectations re: course of current medical issues. . Discussed self-management of symptoms. . Outlined signs and symptoms indicating need for more acute intervention. . Patient verbalized understanding and all questions were answered. Marland Kitchen Health Maintenance issues including appropriate healthy diet, exercise, and smoking avoidance were discussed with patient. . See orders for this visit as documented in the electronic medical record.  Briscoe Deutscher, DO  Records requested if needed. Time spent: 30 minutes, of which >50% was spent in obtaining information about her symptoms, reviewing her previous labs, evaluations, and treatments, counseling her about her condition (please see the discussed topics above), and developing a plan to further investigate it; she had a number of questions which I addressed.

## 2019-08-27 ENCOUNTER — Telehealth: Payer: Self-pay

## 2019-08-27 NOTE — Telephone Encounter (Signed)
I'm not sure who you spoke to, but her referral is currently in review.  All neuro referrals go to review with the doctors before they get scheduled.  So they definitely have it.

## 2019-08-27 NOTE — Telephone Encounter (Signed)
Called patient let her know that we have placed referral. States that she called they do not have referral yet can you let me know if I did that right?

## 2019-08-27 NOTE — Telephone Encounter (Signed)
Copied from [place] [id number]. Topic: General - Other >> Aug 27, 2019  8:39 AM Natalie Erickson wrote: Reason for CRM: pt called in to check the status of her neurology referral.

## 2019-08-27 NOTE — Telephone Encounter (Signed)
I did not call patient did

## 2019-08-28 ENCOUNTER — Other Ambulatory Visit: Payer: Self-pay

## 2019-08-28 ENCOUNTER — Encounter: Payer: PPO | Admitting: Internal Medicine

## 2019-08-28 ENCOUNTER — Ambulatory Visit (INDEPENDENT_AMBULATORY_CARE_PROVIDER_SITE_OTHER)
Admission: EM | Admit: 2019-08-28 | Discharge: 2019-08-28 | Disposition: A | Discharge status: Home / Self Care | Payer: PPO | Source: Home / Self Care

## 2019-08-28 ENCOUNTER — Emergency Department (HOSPITAL_COMMUNITY)
Admission: EM | Admit: 2019-08-28 | Discharge: 2019-08-28 | Discharge status: Against Medical Advice | Payer: PPO | Attending: Emergency Medicine | Admitting: Emergency Medicine

## 2019-08-28 ENCOUNTER — Ambulatory Visit: Payer: Self-pay

## 2019-08-28 ENCOUNTER — Encounter (HOSPITAL_COMMUNITY): Payer: Self-pay

## 2019-08-28 DIAGNOSIS — Z5321 Procedure and treatment not carried out due to patient leaving prior to being seen by health care provider: Secondary | ICD-10-CM | POA: Insufficient documentation

## 2019-08-28 DIAGNOSIS — R1084 Generalized abdominal pain: Secondary | ICD-10-CM

## 2019-08-28 DIAGNOSIS — R42 Dizziness and giddiness: Secondary | ICD-10-CM | POA: Diagnosis present

## 2019-08-28 LAB — CBC
HCT: 40 % (ref 36.0–46.0)
Hemoglobin: 13.3 g/dL (ref 12.0–15.0)
MCH: 32 pg (ref 26.0–34.0)
MCHC: 33.3 g/dL (ref 30.0–36.0)
MCV: 96.4 fL (ref 80.0–100.0)
Platelets: 425 10*3/uL — ABNORMAL HIGH (ref 150–400)
RBC: 4.15 MIL/uL (ref 3.87–5.11)
RDW: 11.9 % (ref 11.5–15.5)
WBC: 8.3 10*3/uL (ref 4.0–10.5)
nRBC: 0 % (ref 0.0–0.2)

## 2019-08-28 LAB — BASIC METABOLIC PANEL
Anion gap: 10 (ref 5–15)
BUN: 13 mg/dL (ref 8–23)
CO2: 27 mmol/L (ref 22–32)
Calcium: 9.8 mg/dL (ref 8.9–10.3)
Chloride: 103 mmol/L (ref 98–111)
Creatinine, Ser: 0.69 mg/dL (ref 0.44–1.00)
GFR calc Af Amer: >60 mL/min (ref >60)
GFR calc non Af Amer: >60 mL/min (ref >60)
Glucose, Bld: 125 mg/dL — ABNORMAL HIGH (ref 70–99)
Potassium: 3.7 mmol/L (ref 3.5–5.1)
Sodium: 140 mmol/L (ref 135–145)

## 2019-08-28 MED ORDER — ALUM & MAG HYDROXIDE-SIMETH 200-200-20 MG/5ML PO SUSP
30.0000 mL | Freq: Once | ORAL | Status: AC
  Administered 2019-08-28: 30 mL via ORAL

## 2019-08-28 MED ORDER — LIDOCAINE VISCOUS HCL 2 % MT SOLN
OROMUCOSAL | Status: AC
  Filled 2019-08-28: qty 15

## 2019-08-28 MED ORDER — LIDOCAINE VISCOUS HCL 2 % MT SOLN
15.0000 mL | Freq: Once | OROMUCOSAL | Status: AC
  Administered 2019-08-28: 15 mL via ORAL

## 2019-08-28 MED ORDER — ALUM & MAG HYDROXIDE-SIMETH 200-200-20 MG/5ML PO SUSP
ORAL | Status: AC
  Filled 2019-08-28: qty 30

## 2019-08-28 MED ORDER — SODIUM CHLORIDE 0.9% FLUSH: 3.0000 mL | Freq: Once | INTRAVENOUS | Status: DC

## 2019-08-28 NOTE — Telephone Encounter (Signed)
See note, please advise. Routing to [employer] also as she is NV/Triage today.

## 2019-08-28 NOTE — ED Provider Notes (Signed)
[facility]    CSN: [id number] Arrival date & time: 08/28/19  1459      History   Chief Complaint Chief Complaint  Patient presents with  . Appointment    3:10  . Abdominal Pain    HPI Natalie Erickson is a 68 y.o. female.   Patient presents with 1 day history of generalized abdominal pain which she describes as "cramping".  She has a history of IBS and states this feels like her usual IBS symptoms.  She states she has been anxious and depressed; her PCP started her on Celexa which she took for 2 days; She believes the Celexa made her IBS worse and she stopped taking it today; last dose yesterday evening.  She denies fever, chills, vomiting, diarrhea.  Last BM today.  She has taken Mylanta and fennel seeds at home without relief.  She used to see a GI specialist for her IBS but has not seen them in "quite a while".  The history is provided by the patient.    History reviewed. No pertinent past medical history.  There are no active problems to display for this patient.   History reviewed. No pertinent surgical history.  OB History   No obstetric history on file.      Home Medications    Prior to Admission medications   Not on File    Family History Family History  Problem Relation Age of Onset  . Healthy Mother   . Healthy Father     Social History Social History   Tobacco Use  . Smoking status: Never Smoker  . Smokeless tobacco: Never Used  Substance Use Topics  . Alcohol use: Not on file  . Drug use: Not on file     Allergies   Patient has no allergy information on record.   Review of Systems Review of Systems  Constitutional: Negative for chills and fever.  HENT: Negative for ear pain and sore throat.   Eyes: Negative for pain and visual disturbance.  Respiratory: Negative for cough and shortness of breath.   Cardiovascular: Negative for chest pain and palpitations.  Gastrointestinal: Positive for abdominal pain. Negative for  constipation, diarrhea, nausea and vomiting.  Genitourinary: Negative for dysuria and hematuria.  Musculoskeletal: Negative for arthralgias and back pain.  Skin: Negative for color change and rash.  Neurological: Negative for seizures and syncope.  All other systems reviewed and are negative.    Physical Exam Triage Vital Signs ED Triage Vitals  Enc Vitals Group     BP 08/28/19 1527 (!) 143/85     Pulse Rate 08/28/19 1527 85     Resp 08/28/19 1527 16     Temp 08/28/19 1527 98.1 F (36.7 C)     Temp Source 08/28/19 1527 Oral     SpO2 08/28/19 1527 99 %     Weight --      Height --      Head Circumference --      Peak Flow --      Pain Score 08/28/19 1523 7     Pain Loc --      Pain Edu? --      Excl. in [place]? --    No data found.  Updated Vital Signs BP (!) 143/85 (BP Location: Left Arm)   Pulse 85   Temp 98.1 F (36.7 C) (Oral)   Resp 16   SpO2 99%   Visual Acuity Right Eye Distance:   Left Eye Distance:  Bilateral Distance:    Right Eye Near:   Left Eye Near:    Bilateral Near:     Physical Exam Vitals signs and nursing note reviewed.  Constitutional:      General: She is not in acute distress.    Appearance: She is well-developed.  HENT:     Head: Normocephalic and atraumatic.     Mouth/Throat:     Mouth: Mucous membranes are moist.     Pharynx: Oropharynx is clear.  Eyes:     Conjunctiva/sclera: Conjunctivae normal.  Neck:     Musculoskeletal: Neck supple.  Cardiovascular:     Rate and Rhythm: Normal rate and regular rhythm.     Heart sounds: No murmur.  Pulmonary:     Effort: Pulmonary effort is normal. No respiratory distress.     Breath sounds: Normal breath sounds.  Abdominal:     Palpations: Abdomen is soft.     Tenderness: There is no abdominal tenderness. There is no right CVA tenderness, left CVA tenderness, guarding or rebound.     Comments: Hyperactive bowel sounds.  Skin:    General: Skin is warm and dry.     Findings: No rash.   Neurological:     Mental Status: She is alert.      UC Treatments / Results  Labs (all labs ordered are listed, but only abnormal results are displayed) Labs Reviewed - No data to display  EKG   Radiology No results found.  Procedures Procedures (including critical care time)  Medications Ordered in UC Medications  alum & mag hydroxide-simeth (MAALOX/MYLANTA) 200-200-20 MG/5ML suspension 30 mL (30 mLs Oral Given 08/28/19 1607)    And  lidocaine (XYLOCAINE) 2 % viscous mouth solution 15 mL (15 mLs Oral Given 08/28/19 1607)  alum & mag hydroxide-simeth (MAALOX/MYLANTA) 200-200-20 MG/5ML suspension (has no administration in time range)  lidocaine (XYLOCAINE) 2 % viscous mouth solution (has no administration in time range)    Initial Impression / Assessment and Plan / UC Course  I have reviewed the triage vital signs and the nursing notes.  Pertinent labs & imaging results that were available during my care of the patient were reviewed by me and considered in my medical decision making (see chart for details).     Generalized abdominal pain.  Pain improved with Mylanta/Maalox/lidocaine.  Instructed patient to go to the emergency department if she develops acute worsening pain or other symptoms such as fever, chills, vomiting, diarrhea.  Encourage patient to follow-up with her GI specialist as soon as possible.  Instructed patient to follow-up with her PCP tomorrow.  Patient agrees with plan of care.     Final Clinical Impressions(s) / UC Diagnoses   Final diagnoses:  Generalized abdominal pain     Discharge Instructions     Go to the emergency department if you develop acute worsening abdominal pain or other symptoms such as fever, chills, vomiting, diarrhea or other concerns.    Follow-up with your primary care provider tomorrow.  Call your GI specialist to be seen as soon as possible.        ED Prescriptions    None     Controlled Substance Prescriptions [place]  Controlled Substance Registry consulted? Not Applicable   Sharion Balloon, NP 08/28/19 [phone]

## 2019-08-28 NOTE — Telephone Encounter (Signed)
I spoke with patient and she informed me that her symptoms have not worsened but are the same.  I informed Dr. Juleen China to advise me on pt's symptoms.  Per Dr. Juleen China, pt does not tolerate most medications well and to tell pt to stay well hydrated and to stop the medication and if it worsens then to call office back.  Pt informed.

## 2019-08-28 NOTE — ED Triage Notes (Signed)
Patient presents to Urgent Care with complaints of mid abdominal pain and cramping since yesterday. Patient reports she was just started on a new medicine for depression and she is worried it may be reacting with her IBS meds. Pt has had frequent BMs with mucous, but no diarrhea.

## 2019-08-28 NOTE — ED Triage Notes (Signed)
Onset today started having dizziness while at urgent care, only when standing up.

## 2019-08-28 NOTE — Discharge Instructions (Addendum)
Go to the emergency department if you develop acute worsening abdominal pain or other symptoms such as fever, chills, vomiting, diarrhea or other concerns.    Follow-up with your primary care provider tomorrow.  Call your GI specialist to be seen as soon as possible.

## 2019-08-28 NOTE — Progress Notes (Signed)
Name: Natalie Erickson  MRN/ DOB: [id number], 08/26/1951    Age/ Sex: 68 y.o., female    PCP: Natalie Deutscher, DO   Reason for Endocrinology Evaluation:      Date of Initial Endocrinology Evaluation: 08/28/2019     HPI: Ms. Natalie Erickson is a 68 y.o. female with a past medical history of Dyslipidemia . The patient presented for initial endocrinology clinic visit on 08/28/2019 for consultative assistance with her Thyroid nodule.   Pt was found to have a left thyroid nodule on neck MRI for evaluation of left neck pain with radiculopathy.  This prompted an ultrasound which showed a left thyroid nodule 3.3 cm meeting criteria for FNA  HISTORY:  Past Medical History:  Past Medical History:  Diagnosis Date  . Anxiety 04/06/2017  . Cystitis   . Depression   . Dry eyes 04/06/2017  . Elevated BP without diagnosis of hypertension 04/06/2017   lost weight >> BP improved  . Fibromyalgia   . Heart murmur    hx of 2 Echocardiograms in [place] (pt was told they were normal)  . Hiatal hernia   . History of colon polyps   . History of echocardiogram    Echo 9/19: mild LVH, EF 60-65, no RWMA, Gr 1 DD, trivial MR/TR, PASP 16  . History of Lyme disease   . IBS (irritable bowel syndrome)   . Mixed hyperlipidemia 04/06/2017   intol to statins due to myalgias; never tried Zetia  . TMJ (dislocation of temporomandibular joint)   . Vasomotor rhinitis     Past Surgical History:  Past Surgical History:  Procedure Laterality Date  . APPENDECTOMY  1959  . LAPAROSCOPY    . LEFT OOPHORECTOMY        Social History:  reports that she has never smoked. She has never used smokeless tobacco. She reports that she does not drink alcohol or use drugs.  Family History: family history includes Breast cancer in her sister; Diabetes in her paternal grandmother; Hypertension in her mother; Lung cancer in her father.   HOME MEDICATIONS: Allergies as of 08/28/2019      Reactions   Diltiazem Shortness Of Breath    Metoprolol Shortness Of Breath   Statins Tinitus   Achy joints, muscle aches Achy joints, muscle aches   Banana Other (See Comments)   migraine   Chocolate Other (See Comments)   migraine   Cocoa Other (See Comments)   migraine migraine   Codeine Nausea And Vomiting   Cranberry Other (See Comments)   Cystitis   Latex Rash   Monosodium Glutamate Other (See Comments)   Migraine   Penicillins Rash   Has patient had a PCN reaction causing immediate rash, facial/tongue/throat swelling, SOB or lightheadedness with hypotension: Yes Has patient had a PCN reaction causing severe rash involving mucus membranes or skin necrosis: No Has patient had a PCN reaction that required hospitalization No Has patient had a PCN reaction occurring within the last 10 years: No If all of the above answers are "NO", then may proceed with Cephalosporin use.   Antihistamines, Loratadine-type    Throat swelling   Carvedilol Other (See Comments)   N&N, SOB , Head ache and joint pain.    Eggs Or Egg-derived Products    Ginger    Gluten Meal    Lexapro [escitalopram]    GI    Montelukast Other (See Comments)   Throat swelling   Other Swelling   Throat swelling   Prednisone Swelling  Throat swelling (no difficulty breathing)   Sulfites Other (See Comments)   Nausea and headaches   Vanilla    Whey    Zofran [ondansetron Hcl] Other (See Comments)   Muscle cramps, leg tingling   Azithromycin Other (See Comments), Diarrhea      Medication List       Accurate as of August 28, 2019  7:52 AM. If you have any questions, ask your nurse or doctor.        acetaminophen 500 MG tablet Commonly known as: TYLENOL Take 1,000 mg by mouth every 6 (six) hours as needed for headache (pain).   citalopram 10 MG tablet Commonly known as: CELEXA 1/2 tab daily   dicyclomine 10 MG capsule Commonly known as: Bentyl Take 1 capsule (10 mg total) by mouth 4 (four) times daily -  before meals and at bedtime.    MAGNESIUM GLUCONATE PO Take 200 mg by mouth as directed.   SYSTANE ULTRA OP Apply to eye 4 (four) times daily.   Vitamin D 50 MCG (2000 UT) tablet Take 2,000 Units by mouth daily.         REVIEW OF SYSTEMS: A comprehensive ROS was conducted with the patient and is negative except as per HPI and below:  ROS     OBJECTIVE:  VS: There were no vitals taken for this visit.   Wt Readings from Last 3 Encounters:  08/26/19 128 lb (58.1 kg)  08/12/19 130 lb (59 kg)  07/30/19 129 lb (58.5 kg)     EXAM: General: Pt appears well and is in NAD  Hydration: Well-hydrated with moist mucous membranes and good skin turgor  Eyes: External eye exam normal without stare, lid lag or exophthalmos.  EOM intact.  PERRL.  Ears, Nose, Throat: Hearing: Grossly intact bilaterally Dental: Good dentition  Throat: Clear without mass, erythema or exudate  Neck: General: Supple without adenopathy. Thyroid: Thyroid size normal.  No goiter or nodules appreciated. No thyroid bruit.  Lungs: Clear with good BS bilat with no rales, rhonchi, or wheezes  Heart: Auscultation: RRR.  Abdomen: Normoactive bowel sounds, soft, nontender, without masses or organomegaly palpable  Extremities: Gait and station: Normal gait  Digits and nails: No clubbing, cyanosis, petechiae, or nodes Head and neck: Normal alignment and mobility BL UE: Normal ROM and strength. BL LE: No pretibial edema normal ROM and strength.  Skin: Hair: Texture and amount normal with gender appropriate distribution Skin Inspection: No rashes, acanthosis nigricans/skin tags. No lipohypertrophy Skin Palpation: Skin temperature, texture, and thickness normal to palpation  Neuro: Cranial nerves: II - XII grossly intact  Cerebellar: Normal coordination and movement; no tremor Motor: Normal strength throughout DTRs: 2+ and symmetric in UE without delay in relaxation phase  Mental Status: Judgment, insight: Intact Orientation: Oriented to time,  place, and person Memory: Intact for recent and remote events Mood and affect: No depression, anxiety, or agitation     DATA REVIEWED: Results for Natalie Erickson (MRN [id number]) as of 08/28/2019 07:53  Ref. Range 06/04/2019 09:50  TSH Latest Ref Range: 0.35 - 4.50 uIU/mL 0.64  T4,Free(Direct) Latest Ref Range: 0.60 - 1.60 ng/dL 0.89    Thyroid Ultrasound 08/01/2019   Nodule # 2:  Location: Left; Mid  Maximum size: 3.3 cm; Other 2 dimensions: 2.3 x 1.7 cm  Composition: solid/almost completely solid (2)  Echogenicity: hypoechoic (2)  Shape: not taller-than-wide (0)  Margins: smooth (0)  Echogenic foci: none (0)  ACR TI-RADS total points: 4.  ACR TI-RADS risk category:  TR4 (4-6 points).  ACR TI-RADS recommendations:  **Given size (>/= 1.5 cm) and appearance, fine needle aspiration of this moderately suspicious nodule should be considered based on TI-RADS criteria.  _________________________________________________________  There are additional subcentimeter hypoechoic and cystic nodules noted bilaterally all measuring 9 mm or less in size. These would not meet criteria for any biopsy or follow-up.  Hypervascularity  No regional adenopathy  IMPRESSION: 3.3 cm left mid thyroid TR 4 nodule meets criteria for biopsy as above. This correlates with the MRI finding. ASSESSMENT/PLAN/RECOMMENDATIONS:   1.     Medications :  Signed electronically by: Mack Guise, MD  [facility] Endocrinology  [facility] [address] [place], [place] [address] Phone: [phone] FAX: [phone]   CC: Natalie Erickson, [place] [address] Phone: [phone] Fax: [phone]   Return to Endocrinology clinic as below: Future Appointments  Date Time Provider [place]  08/28/2019 11:10 AM Shamleffer, Melanie Crazier, MD [facility] None  09/04/2019 10:30 AM [facility] None   09/17/2019  2:45 PM [facility] [place] [facility]

## 2019-08-28 NOTE — ED Notes (Signed)
Pt wants to leave. Pt encouraged to stay and see provider. Pt refuses to stay and talk to charge RN. Pt walks out saying she will call husband for ride.

## 2019-08-28 NOTE — Telephone Encounter (Signed)
Pt. Reports she had a visit with Dr. Juleen China Monday and was started on Celexa. States at 11:00 last night she started having upper abdominal pain, above her belly button. Pain is 7-8/10. Reports she has IBS. Did have a bowel movement last night. Feels weak and dizzy as well. Madelyn in the practice advises to send triage for PCP to review. Instructed pt. If symptoms worsen to go to ED. Verbalizes understanding.  Answer Assessment - Initial Assessment Questions 1. LOCATION: "Where does it hurt?"      Above belly button 2. RADIATION: "Does the pain shoot anywhere else?" (e.g., chest, back)     To her back on the right side 3. ONSET: "When did the pain begin?" (e.g., minutes, hours or days ago)      Last night at 11:00 4. SUDDEN: "Gradual or sudden onset?"     Sudden 5. PATTERN "Does the pain come and go, or is it constant?"    - If constant: "Is it getting better, staying the same, or worsening?"      (Note: Constant means the pain never goes away completely; most serious pain is constant and it progresses)     - If intermittent: "How long does it last?" "Do you have pain now?"     (Note: Intermittent means the pain goes away completely between bouts)     Constant 6. SEVERITY: "How bad is the pain?"  (e.g., Scale 1-10; mild, moderate, or severe)   - MILD (1-3): doesn't interfere with normal activities, abdomen soft and not tender to touch    - MODERATE (4-7): interferes with normal activities or awakens from sleep, tender to touch    - SEVERE (8-10): excruciating pain, doubled over, unable to do any normal activities      7-8 7. RECURRENT SYMPTOM: "Have you ever had this type of abdominal pain before?" If so, ask: "When was the last time?" and "What happened that time?"      Yes - with her IBS 8. CAUSE: "What do you think is causing the abdominal pain?"     Unsure 9. RELIEVING/AGGRAVATING FACTORS: "What makes it better or worse?" (e.g., movement, antacids, bowel movement)     Nothing 10.  OTHER SYMPTOMS: "Has there been any vomiting, diarrhea, constipation, or urine problems?"       Weakness, nausea 11. PREGNANCY: "Is there any chance you are pregnant?" "When was your last menstrual period?"       No  Protocols used: ABDOMINAL PAIN - [facility]

## 2019-08-29 ENCOUNTER — Telehealth: Payer: Self-pay | Admitting: Family Medicine

## 2019-08-29 ENCOUNTER — Encounter: Payer: Self-pay | Admitting: Family Medicine

## 2019-08-29 DIAGNOSIS — R42 Dizziness and giddiness: Secondary | ICD-10-CM | POA: Diagnosis not present

## 2019-08-29 DIAGNOSIS — M5481 Occipital neuralgia: Secondary | ICD-10-CM | POA: Diagnosis not present

## 2019-08-29 NOTE — Telephone Encounter (Signed)
Please advise 

## 2019-08-29 NOTE — Telephone Encounter (Signed)
Patient wanted the provider to know that she went to a Urgent Care and they didn't do anything for her but tell her she needed to see a Copywriter, advertising but she cannot see the [place] doctor until Sep 12, 2019.  She also has Neuro issues going on but the Neurologist cannot see her until October 07, 2019.  She went to the ER last night but it was a 7hr wait and she couldn't sit that long.  Because of all of this she is very depressed.  Please advise.

## 2019-09-04 ENCOUNTER — Ambulatory Visit (INDEPENDENT_AMBULATORY_CARE_PROVIDER_SITE_OTHER): Payer: PPO | Admitting: Neurology

## 2019-09-04 DIAGNOSIS — R351 Nocturia: Secondary | ICD-10-CM

## 2019-09-04 DIAGNOSIS — G4719 Other hypersomnia: Secondary | ICD-10-CM

## 2019-09-04 DIAGNOSIS — G4733 Obstructive sleep apnea (adult) (pediatric): Secondary | ICD-10-CM

## 2019-09-04 DIAGNOSIS — R0683 Snoring: Secondary | ICD-10-CM

## 2019-09-04 DIAGNOSIS — R519 Headache, unspecified: Secondary | ICD-10-CM

## 2019-09-09 ENCOUNTER — Telehealth: Payer: Self-pay | Admitting: Neurology

## 2019-09-09 ENCOUNTER — Ambulatory Visit: Payer: PPO | Admitting: Gastroenterology

## 2019-09-09 DIAGNOSIS — H1045 Other chronic allergic conjunctivitis: Secondary | ICD-10-CM | POA: Diagnosis not present

## 2019-09-09 DIAGNOSIS — H2513 Age-related nuclear cataract, bilateral: Secondary | ICD-10-CM | POA: Diagnosis not present

## 2019-09-09 DIAGNOSIS — H04123 Dry eye syndrome of bilateral lacrimal glands: Secondary | ICD-10-CM | POA: Diagnosis not present

## 2019-09-09 DIAGNOSIS — H25013 Cortical age-related cataract, bilateral: Secondary | ICD-10-CM | POA: Diagnosis not present

## 2019-09-09 NOTE — Telephone Encounter (Signed)
Pt called wanting to know what the next step is after her sleep study test. Please advise.

## 2019-09-09 NOTE — Telephone Encounter (Signed)
It appears that pt's sleep study was on 09/04/19 and we do not have her sleep study results yet. I will call her when those results become available.

## 2019-09-10 ENCOUNTER — Encounter: Payer: Self-pay | Admitting: Internal Medicine

## 2019-09-10 ENCOUNTER — Other Ambulatory Visit: Payer: Self-pay

## 2019-09-10 ENCOUNTER — Ambulatory Visit: Payer: PPO | Admitting: Internal Medicine

## 2019-09-10 VITALS — BP 124/78 | HR 73 | Temp 98.7°F | Ht 62.0 in | Wt 130.4 lb

## 2019-09-10 DIAGNOSIS — E042 Nontoxic multinodular goiter: Secondary | ICD-10-CM | POA: Diagnosis not present

## 2019-09-10 NOTE — Progress Notes (Signed)
Name: Natalie Erickson  MRN/ DOB: [id number], [birth year]    Age/ Sex: 68 y.o., female    PCP: Briscoe Deutscher, DO   Reason for Endocrinology Evaluation: Thyroid nodules     Date of Initial Endocrinology Evaluation: 09/11/2019     HPI: Ms. Natalie Erickson is a 68 y.o. female with a past medical history of Dyslipidemia and anxiety . The patient presented for initial endocrinology clinic visit on 09/11/2019 for consultative assistance with her thyroid nodule    Pt was found to have an incidental thyroid nodules on MRI 06/2019 during evaluation of a left neck pain with radiculopathy.    An ultrasound 07/2019 confirmed diagnosis of multiple thyroid nodules, with the largest being 3.3 cm at the left middle thyroid lobe.    No FH of thyroid disease  No prior radiation exposure   No Biotin intake    Denies any local neck enlargement, pain, dysphagia or hoarseness.  No hypo or hyperthyroid symptoms.     HISTORY:  Past Medical History:  Past Medical History:  Diagnosis Date  . Anxiety 04/06/2017  . Cystitis   . Depression   . Dry eyes 04/06/2017  . Elevated BP without diagnosis of hypertension 04/06/2017   lost weight >> BP improved  . Fibromyalgia   . Heart murmur    hx of 2 Echocardiograms in [place] (pt was told they were normal)  . Hiatal hernia   . History of colon polyps   . History of echocardiogram    Echo 9/19: mild LVH, EF 60-65, no RWMA, Gr 1 DD, trivial MR/TR, PASP 16  . History of Lyme disease   . IBS (irritable bowel syndrome)   . Mixed hyperlipidemia 04/06/2017   intol to statins due to myalgias; never tried Zetia  . TMJ (dislocation of temporomandibular joint)   . Vasomotor rhinitis    Past Surgical History:  Past Surgical History:  Procedure Laterality Date  . APPENDECTOMY  1959  . LAPAROSCOPY    . LEFT OOPHORECTOMY        Social History:  reports that she has never smoked. She has never used smokeless tobacco. She reports that she does not  drink alcohol or use drugs.  Family History: family history includes Breast cancer in her sister; Diabetes in her paternal grandmother; Healthy in her father and mother; Hypertension in her mother; Lung cancer in her father.   HOME MEDICATIONS: Allergies as of 09/10/2019      Reactions   Diltiazem Shortness Of Breath   Metoprolol Shortness Of Breath   Statins Tinitus   Achy joints, muscle aches Achy joints, muscle aches   Chocolate Other (See Comments)   migraine   Cocoa Other (See Comments)   migraine migraine   Codeine Nausea And Vomiting   Cranberry Other (See Comments)   Cystitis   Latex Rash   Monosodium Glutamate Other (See Comments)   Migraine   Penicillins Rash   Has patient had a PCN reaction causing immediate rash, facial/tongue/throat swelling, SOB or lightheadedness with hypotension: Yes Has patient had a PCN reaction causing severe rash involving mucus membranes or skin necrosis: No Has patient had a PCN reaction that required hospitalization No Has patient had a PCN reaction occurring within the last 10 years: No If all of the above answers are "NO", then may proceed with Cephalosporin use.   Antihistamines, Loratadine-type    Throat swelling   Carvedilol Other (See Comments)   N&N, SOB , Head ache and joint pain.  Eggs Or Egg-derived Products    Ginger    Gluten Meal    Lexapro [escitalopram]    GI    Montelukast Other (See Comments)   Throat swelling   Other Swelling   Throat swelling   Prednisone Swelling   Throat swelling (no difficulty breathing)   Sulfites Other (See Comments)   Nausea and headaches   Vanilla    Whey    Zofran [ondansetron Hcl] Other (See Comments)   Muscle cramps, leg tingling   Azithromycin Other (See Comments), Diarrhea      Medication List       Accurate as of September 10, 2019 11:59 PM. If you have any questions, ask your nurse or doctor.        acetaminophen 500 MG tablet Commonly known as: TYLENOL Take 1,000  mg by mouth every 6 (six) hours as needed for headache (pain).   carvedilol 6.25 MG tablet Commonly known as: COREG TAKE 0.5 TABLETS (3.125 MG TOTAL) BY MOUTH 2 (TWO) TIMES DAILY.   citalopram 10 MG tablet Commonly known as: CELEXA 1/2 tab daily   cyclobenzaprine 5 MG tablet Commonly known as: FLEXERIL Take 5 mg by mouth 3 (three) times daily as needed for muscle spasms.   dicyclomine 10 MG capsule Commonly known as: Bentyl Take 1 capsule (10 mg total) by mouth 4 (four) times daily -  before meals and at bedtime.   gabapentin 100 MG capsule Commonly known as: NEURONTIN Take by mouth.   MAGNESIUM GLUCONATE PO Take 200 mg by mouth as directed.   SYSTANE ULTRA OP Apply to eye 4 (four) times daily.   vitamin B-6 25 MG tablet Commonly known as: pyridOXINE Take 25 mg by mouth daily.   Vitamin D 50 MCG (2000 UT) tablet Take 2,000 Units by mouth daily.         REVIEW OF SYSTEMS: A comprehensive ROS was conducted with the patient and is negative except as per HPI and below:  Review of Systems  Constitutional: Negative for fever and weight loss.  HENT: Negative for congestion and sore throat.   Respiratory: Negative for cough and shortness of breath.   Cardiovascular: Positive for palpitations. Negative for chest pain.  Gastrointestinal: Positive for constipation. Negative for nausea.  Musculoskeletal: Positive for joint pain and neck pain.  Neurological: Positive for tingling. Negative for tremors.       OBJECTIVE:  VS: BP 124/78 (BP Location: Left Arm, Patient Position: Sitting, Cuff Size: Normal)   Pulse 73   Temp 98.7 F (37.1 C)   Ht 5\' 2"  (1.575 m)   Wt 130 lb 6.4 oz (59.1 kg)   SpO2 98%   BMI 23.85 kg/m    Wt Readings from Last 3 Encounters:  09/10/19 130 lb 6.4 oz (59.1 kg)  08/26/19 128 lb (58.1 kg)  08/12/19 130 lb (59 kg)     EXAM: General: Pt appears well and is in NAD  Eyes: External eye exam normal without stare, lid lag or exophthalmos.   EOM intact.    Ears, Nose, Throat: Hearing: Grossly intact bilaterally Dental: Good dentition  Throat: Clear without mass, erythema or exudate  Neck: General: Supple without adenopathy. Thyroid: Thyroid size normal.  No goiter or nodules appreciated. No thyroid bruit.  Lungs: Clear with good BS bilat with no rales, rhonchi, or wheezes  Heart: Auscultation: RRR.  Abdomen: Normoactive bowel sounds, soft, nontender, without masses or organomegaly palpable  Extremities:  BL LE: No pretibial edema normal ROM and strength.  Skin: Hair: Texture and amount normal with gender appropriate distribution Skin Inspection: No rashes. Skin Palpation: Skin temperature, texture, and thickness normal to palpation  Neuro: Cranial nerves: II - XII grossly intact  Motor: Normal strength throughout DTRs: 2+ and symmetric in UE without delay in relaxation phase  Mental Status: Judgment, insight: Intact Orientation: Oriented to time, place, and person Mood and affect: No depression, anxiety, or agitation     DATA REVIEWED: Results for YOUA, GOELZ ANN (MRN [id number]) as of 09/10/2019 10:48  Ref. Range 06/04/2019 09:50  TSH Latest Ref Range: 0.35 - 4.50 uIU/mL 0.64  T4,Free(Direct) Latest Ref Range: 0.60 - 1.60 ng/dL 0.89    Thyroid Ultrasound 08/01/2019   Nodule # 2:  Location: Left; Mid  Maximum size: 3.3 cm; Other 2 dimensions: 2.3 x 1.7 cm  Composition: solid/almost completely solid (2)  Echogenicity: hypoechoic (2)  Shape: not taller-than-wide (0)  Margins: smooth (0)  Echogenic foci: none (0)  ACR TI-RADS total points: 4.  ACR TI-RADS risk category: TR4 (4-6 points).  ACR TI-RADS recommendations:  **Given size (>/= 1.5 cm) and appearance, fine needle aspiration of this moderately suspicious nodule should be considered based on TI-RADS criteria.  _________________________________________________________  There are additional subcentimeter hypoechoic and cystic nodules  noted bilaterally all measuring 9 mm or less in size. These would not meet criteria for any biopsy or follow-up.  Hypervascularity  No regional adenopathy  IMPRESSION: 3.3 cm left mid thyroid TR 4 nodule meets criteria for biopsy as above. This correlates with the MRI finding.  ASSESSMENT/PLAN/RECOMMENDATIONS:   1. Multiple Thyroid Nodules:   - No local neck symptoms  - She is clinically and biochemically euthyroid  - Will proceed with FNA of the left mid thyroid nodule - Will discuss future planning following FNA results   F/u in 1 yr   Signed electronically by: Mack Guise, MD  [facility]  [address] [place], [place] [address] Phone: [phone] FAX: [phone]   CC: Briscoe Deutscher, [place] [address] Phone: [phone] Fax: [phone]   Return to Endocrinology clinic as below: Future Appointments  Date Time Provider [place]  09/12/2019  9:00 AM Leotis Pain [facility]  09/17/2019  2:45 PM [facility] [place] 3 [facility]  09/26/2019  2:00 PM Rico Junker, PT [facility]  09/08/2020 10:30 AM Shamleffer, Melanie Crazier, MD [facility] None

## 2019-09-10 NOTE — Patient Instructions (Signed)
-   Please notify us when you have the thyroid biopsy results

## 2019-09-11 ENCOUNTER — Encounter: Payer: Self-pay | Admitting: Internal Medicine

## 2019-09-12 ENCOUNTER — Ambulatory Visit: Payer: PPO | Admitting: Physician Assistant

## 2019-09-12 ENCOUNTER — Encounter: Payer: Self-pay | Admitting: Physician Assistant

## 2019-09-12 VITALS — BP 124/70 | HR 87 | Temp 99.2°F | Ht 62.0 in | Wt 130.0 lb

## 2019-09-12 DIAGNOSIS — Z86010 Personal history of colonic polyps: Secondary | ICD-10-CM

## 2019-09-12 DIAGNOSIS — K581 Irritable bowel syndrome with constipation: Secondary | ICD-10-CM

## 2019-09-12 NOTE — Patient Instructions (Signed)
We will mail you out a recall reminder letter for colonoscopy around 07/2020  Please call in June to schedule a follow up with Dr Loletha Carrow  Continue current regimen   If you are age 68 or older, your body mass index should be between 23-30. Your Body mass index is 23.78 kg/m. If this is out of the aforementioned range listed, please consider follow up with your Primary Care Provider.  If you are age 29 or younger, your body mass index should be between 19-25. Your Body mass index is 23.78 kg/m. If this is out of the aformentioned range listed, please consider follow up with your Primary Care Provider.

## 2019-09-12 NOTE — Progress Notes (Signed)
Patient referred by Dr. Juleen China, seen by me on 08/12/19, HST on 09/05/19.    Please call and notify the patient that the recent home sleep test showed obstructive sleep apnea in the moderate range. While I recommend treatment for this in the form CPAP, her insurance has not approved a sleep study for this. They will likely only approve a trial of autoPAP, which means, that we don't have to bring her in for a sleep study with CPAP, but will let her start an autoPAP machine at home, through a DME company (of her choice, or as per insurance requirement). The DME representative will educate her on how to use the machine, how to put the mask on, etc. I have placed an order in the chart. Please send referral, talk to patient, send report to referring MD. We will need a FU in sleep clinic for 10 weeks post-PAP set up, please arrange that with me or one of our NPs. Thanks,   Star Age, MD, PhD [facility])

## 2019-09-12 NOTE — Addendum Note (Signed)
Addended by: Star Age on: 09/12/2019 07:16 PM   Modules accepted: Orders

## 2019-09-12 NOTE — Procedures (Signed)
Patient Information     First Name: Natalie Last Name: Erickson ID: [id number]  Birth Date: 1951/11/03 Age: 68   Referring Provider: Briscoe Deutscher, DO BMI: 23.9 (W=130 lb, H=5' 2'')  Neck Circ.:  13 '' Epworth:  9   Sleep Study Information    Study Date: 09/05/19 S/H/A Version: 001.001.001.001 / 4.1.1528 / 71  History:                   68 year old woman with a history of irritable bowel syndrome, history of heart murmur, fibromyalgia, depression, anxiety, headaches, and hyperlipidemia, who reports snoring and excessive daytime somnolence.  Summary & Diagnosis:     OSA Recommendations:     This home sleep test demonstrates moderate obstructive sleep apnea with a total AHI of 17.5/hour and O2 nadir of 74%. Given the patient's medical history and sleep related complaints, treatment with positive airway pressure (in the form of CPAP) is recommended. This will require a full night CPAP titration study for proper treatment settings, O2 monitoring and mask fitting. Based on the severity of the sleep disordered breathing an attended titration study is indicated. However, patient's insurance has denied an attended sleep study; therefore, the patient will be advised to proceed with an autoPAP titration/trial at home for now. Please note that untreated obstructive sleep apnea may carry additional perioperative morbidity. Patients with significant obstructive sleep apnea should receive perioperative PAP therapy and the surgeons and particularly the anesthesiologist should be informed of the diagnosis and the severity of the sleep disordered breathing. The patient should be cautioned not to drive, work at heights, or operate dangerous or heavy equipment when tired or sleepy. Review and reiteration of good sleep hygiene measures should be pursued with any patient. Other causes of the patient's symptoms, including circadian rhythm disturbances, an underlying mood disorder, medication effect and/or an underlying medical  problem cannot be ruled out based on this test. Clinical correlation is recommended. The patient and her referring provider will be notified of the test results. The patient will be seen in follow up in sleep clinic at [facility].  I certify that I have reviewed the raw data recording prior to the issuance of this report in accordance with the standards of the American Academy of Sleep Medicine (AASM).  Star Age, MD, PhD [facility]) Diplomat, ABPN (Neurology and Sleep)             Sleep Summary    Oxygen Saturation Statistics     Start Study Time: End Study Time: Total Recording Time:  12:00:55AM 8:02:48 AM 8 hrs, 1 min  Total Sleep Time % REM of Sleep Time:  6 hrs, 36 min 20.1    Mean: 95 Minimum: 74 Maximum: 100  Mean of Desaturations Nadirs (%):   90  Oxygen Desatur. %:   4-9 10-20 >20 Total  Events Number Total    70  36 65.4 33.6  1 0.9  107 100.0  Oxygen Saturation: <90 <=88 <85 <80 <70  Duration (minutes): Sleep % 5.7 1.4  2.6 0.0  0.7 0.0 0.0 0.0 0.0 0.0     Respiratory Indices      Total Events REM NREM All Night  pRDI:  125  pAHI:  114 ODI:  107  pAHIc:  36  % CSR: 0.0 9.9 6.9 6.9 1.5 21.5 20.1 18.8 6.5 19.2 17.5 16.4 5.5       Pulse Rate Statistics during Sleep (BPM)      Mean:  60 Minimum: 48 Maximum: 99    Indices are calculated using technically valid sleep time of  6 hrs, 31 min. pRDI/pAHI are calculated using oxi desaturations ? 3% Sit N/A Body Position Statistics  Position Supine Prone Right Left Non-Supine  Sleep (min) 182.4 0.0 214.0 0.0 214.0  Sleep % 46.0 0.0 54.0 0.0 54.0  pRDI 34.8 N/A 5.7 N/A 5.7  pAHI 34.8 N/A 2.6 N/A 2.6  ODI 34.1 N/A 1.1 N/A 1.1     Snoring Statistics Snoring Level (dB) >40 >50 >60 >70 >80 >Threshold (45)  Sleep (min) 30.3 5.2 0.7 0.1 0.0 11.3  Sleep % 7.6 1.3 0.2 0.0 0.0 2.8    Mean: 40 dB Sleep Stages Chart                           pAHI=17.5                                        Mild              Moderate                    Severe

## 2019-09-12 NOTE — Progress Notes (Signed)
Subjective:    Patient ID: Natalie Erickson, female    DOB: [birth year], 68 y.o.   MRN: [id number]  HPI Natalie Erickson is a very nice 68 year old white female, known to Dr. Loletha Carrow and myself.  She was last seen in October 2019, and comes in today because of concerns for recent exacerbation of IBS symptoms. Patient has history of interstitial cystitis, chronic anxiety, hypertension, fibromyalgia, chronic fatigue, positive ANA and history of colon polyps.  She had a prolonged systemic illness a couple of years ago likely viral with extensive negative work-up and has suffered from IBS type symptoms since.  She had been treated in the past with Lexapro, probiotics, and has done well on a low FODMAP diet.  She has been extremely sensitive to prescription medications and has tried to avoid. Last colonoscopy was done in 2016 in [place].  Patient did have polyps and was told to have 5-year follow-up. EGD in 2018 here was negative. Patient says she started having abdominal pain and cramping 3-4 weeks ago, consistent with her IBS.  She also continues to have fairly chronic nausea but has used slippery elm with some success.  She has been maintaining her weight but is down a few pounds over the past couple of weeks due to flare of symptoms.  She has been having fairly regular bowel movements.  She has not been taking probiotics recently, does take a prebiotic. She had recent visit with her neurologist for occipital neuralgia and was started on gabapentin 100 mg p.o. daily.  She says she very quickly noticed improvement in her abdominal pain and cramping and has felt fine since.  She is very pleased with her improvement.  She says she has had some improvement in the occipital neuralgia symptoms but not resolution.   Review of Systems Pertinent positive and negative review of systems were noted in the above HPI section.  All other review of systems was otherwise negative.  Outpatient Encounter Medications as of  09/12/2019  Medication Sig  . acetaminophen (TYLENOL) 500 MG tablet Take 1,000 mg by mouth every 6 (six) hours as needed for headache (pain).  . AMBULATORY NON FORMULARY MEDICATION Take 1 tablet by mouth as needed. Medication Name: [name]  . Cholecalciferol (VITAMIN D) 2000 units tablet Take 2,000 Units by mouth daily.   Marland Kitchen docusate sodium (COLACE) 50 MG capsule Take 50 mg by mouth as needed for mild constipation.  . gabapentin (NEURONTIN) 100 MG capsule Take by mouth.  Marland Kitchen MAGNESIUM GLUCONATE PO Take 200 mg by mouth as directed.   [name] (SYSTANE ULTRA OP) Apply to eye 4 (four) times daily.  Marland Kitchen pyridOXINE (B-6) 50 MG tablet Take 50 mg by mouth daily.  . [DISCONTINUED] carvedilol (COREG) 6.25 MG tablet TAKE 0.5 TABLETS (3.125 MG TOTAL) BY MOUTH 2 (TWO) TIMES DAILY.  . [DISCONTINUED] citalopram (CELEXA) 10 MG tablet 1/2 tab daily (Patient not taking: Reported on 09/10/2019)  . [DISCONTINUED] cyclobenzaprine (FLEXERIL) 5 MG tablet Take 5 mg by mouth 3 (three) times daily as needed for muscle spasms.  . [DISCONTINUED] dicyclomine (BENTYL) 10 MG capsule Take 1 capsule (10 mg total) by mouth 4 (four) times daily -  before meals and at bedtime. (Patient not taking: Reported on 09/10/2019)  . [DISCONTINUED] vitamin B-6 (PYRIDOXINE) 25 MG tablet Take 25 mg by mouth daily.   No facility-administered encounter medications on file as of 09/12/2019.    Allergies  Allergen Reactions  . Diltiazem Shortness Of Breath  . Metoprolol Shortness  Of Breath  . Statins Tinitus    Achy joints, muscle aches Achy joints, muscle aches  . Chocolate Other (See Comments)    migraine  . Cocoa Other (See Comments)    migraine migraine  . Codeine Nausea And Vomiting  . Cranberry Other (See Comments)    Cystitis  . Latex Rash  . Monosodium Glutamate Other (See Comments)    Migraine  . Penicillins Rash    Has patient had a PCN reaction causing immediate rash, facial/tongue/throat swelling,  SOB or lightheadedness with hypotension: Yes Has patient had a PCN reaction causing severe rash involving mucus membranes or skin necrosis: No Has patient had a PCN reaction that required hospitalization No Has patient had a PCN reaction occurring within the last 10 years: No If all of the above answers are "NO", then may proceed with Cephalosporin use.   Marland Kitchen Antihistamines, Loratadine-Type     Throat swelling  . Carvedilol Other (See Comments)    N&N, SOB , Head ache and joint pain.   . Eggs Or Egg-Derived Products   . Ginger   . Gluten Meal   . Lexapro [Escitalopram]     GI   . Montelukast Other (See Comments)    Throat swelling  . Other Swelling    Throat swelling  . Prednisone Swelling    Throat swelling (no difficulty breathing)  . Sulfites Other (See Comments)    Nausea and headaches  . Vanilla   . Whey   . Zofran [Ondansetron Hcl] Other (See Comments)    Muscle cramps, leg tingling  . Azithromycin Other (See Comments) and Diarrhea   Patient Active Problem List   Diagnosis Date Noted  . Abnormal imaging of thyroid 08/04/2019  . Cervicalgia 07/12/2019  . 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 Anxiety about health 07/12/2019  . 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 Change in stool 11/14/2018  . Posterior vitreous detachment of right eye 09/10/2018  . History of IBS 01/30/2018  . Urolithiasis 06/23/2017  . Fibromyalgia 06/04/2017  . Chronic fatigue 06/04/2017  . Medication intolerance 06/04/2017  . ANA positive 05/20/2017  . 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 Serum calcium elevated 05/20/2017  . Interstitial cystitis 05/20/2017  . Dyspepsia 05/20/2017  . Mixed hyperlipidemia 04/06/2017  . Elevated BP without diagnosis of hypertension 04/06/2017  . Anxiety 04/06/2017  . Dry eyes 04/06/2017   Social History   Socioeconomic History  . Marital status: Married    Spouse name: Not on file  . Number of children: 2  . Years of education: Therapist, sports  . Highest education level:  Not on file  Occupational History  . Occupation: Retired  Scientific laboratory technician  . Financial resource strain: Not on file  . Food insecurity    Worry: Not on file    Inability: Not on file  . Transportation needs    Medical: Not on file    Non-medical: Not on file  Tobacco Use  . Smoking status: Never Smoker  . Smokeless tobacco: Never Used  Substance and Sexual Activity  . Alcohol use: No  . Drug use: No  . Sexual activity: Yes  Lifestyle  . Physical activity    Days per week: Not on file    Minutes per session: Not on file  . Stress: Not on file  Relationships  . Social Herbalist on phone: Not on file    Gets together: Not on file    Attends religious service: Not on file    Active  member of club or organization: Not on file    Attends meetings of clubs or organizations: Not on file    Relationship status: Not on file  . Intimate partner violence    Fear of current or ex partner: Not on file    Emotionally abused: Not on file    Physically abused: Not on file    Forced sexual activity: Not on file  Other Topics Concern  . 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 Natalie Erickson's family history includes Breast cancer in her sister; Diabetes in her paternal grandmother; Healthy in her father and mother; Hypertension in her mother; Lung cancer in her father.      Objective:    Vitals:   09/12/19 0907  BP: 124/70  Pulse: 87  Temp: 99.2 F (37.3 C)    Physical Exam Well-developed well-nourished older white in no acute distress.  Height, Weight, 130 BMI 23.7  HEENT; nontraumatic normocephalic, EOMI, PER R LA, sclera anicteric. Oropharynx; not examined/wearing mass/COVID Neck; supple, no JVD Cardiovascular; regular rate and rhythm with S1-S2, no murmur rub or gallop Pulmonary; Clear  bilaterally Abdomen; soft, nontender, nondistended, no palpable mass or hepatosplenomegaly, bowel sounds are active Rectal; not done today Skin; benign exam, no jaundice rash or appreciable lesions Extremities; no clubbing cyanosis or edema skin warm and dry Neuro/Psych; alert and oriented x4, grossly nonfocal mood and affect appropriate       Assessment & Plan:   #[age] year old white female with history of IBS, postinfectious who comes in today due to recent exacerbation of abdominal pain and cramping. Fortunately symptoms have completely resolved after initiating Neurontin 2 weeks ago at low-dose for occipital neuralgia.  2 history of colon polyps-due for follow-up colonoscopy August 2021 3.  Interstitial cystitis 4.  Chronic anxiety 5.  Fibromyalgia and chronic fatigue 6.  History of hypertension  Plan; No further GI intervention indicated at this time. She will continue low FODMAP diet. Continue Colace as needed Patient will be placed in our system for recall colonoscopy with Dr. Loletha Carrow ,August 2021. She will follow-up in the interim as needed.  Amy S Esterwood PA-C 09/12/2019   Cc: Briscoe Deutscher, DO

## 2019-09-13 NOTE — Progress Notes (Signed)
____________________________________________________________  Attending physician addendum:  Thank you for sending this case to me. I have reviewed the entire note, and the outlined plan seems appropriate.  Agree with conservative plan given her sensitivity to medicines. IB gard could be tried with low risk.  Wilfrid Lund, MD  ____________________________________________________________

## 2019-09-16 ENCOUNTER — Telehealth: Payer: Self-pay

## 2019-09-16 NOTE — Telephone Encounter (Signed)
I called pt. I advised pt that Dr. Rexene Alberts reviewed their sleep study results and found that pt has moderate osa. Dr. Rexene Alberts recommends that pt start an auto pap at home. I reviewed PAP compliance expectations with the pt. Pt is agreeable to starting an auto-PAP. I advised pt that an order will be sent to a DME, [facility], and [facility] will call the pt within about one week after they file with the pt's insurance. [facility] will show the pt how to use the machine, fit for masks, and troubleshoot the auto-PAP if needed. A follow up appt was made for insurance purposes with Amy, NP on 12/11/19 at 11:00am. Pt verbalized understanding to arrive 15 minutes early and bring their auto-PAP. A letter with all of this information in it will be mailed to the pt as a reminder. I verified with the pt that the address we have on file is correct. Pt verbalized understanding of results. Pt had no questions at this time but was encouraged to call back if questions arise. I have sent the order to [employer] and have received confirmation that they have received the order.

## 2019-09-16 NOTE — Telephone Encounter (Signed)
-----   Message from Star Age, MD sent at 09/12/2019  7:16 PM EDT ----- Patient referred by Dr. Juleen China, seen by me on 08/12/19, HST on 09/05/19.    Please call and notify the patient that the recent home sleep test showed obstructive sleep apnea in the moderate range. While I recommend treatment for this in the form CPAP, her insurance has not approved a sleep study for this. They will likely only approve a trial of autoPAP, which means, that we don't have to bring her in for a sleep study with CPAP, but will let her start an autoPAP machine at home, through a DME company (of her choice, or as per insurance requirement). The DME representative will educate her on how to use the machine, how to put the mask on, etc. I have placed an order in the chart. Please send referral, talk to patient, send report to referring MD. We will need a FU in sleep clinic for 10 weeks post-PAP set up, please arrange that with me or one of our NPs. Thanks,   Star Age, MD, PhD [facility])

## 2019-09-16 NOTE — Telephone Encounter (Signed)
Patient returned your call.

## 2019-09-16 NOTE — Telephone Encounter (Signed)
I called pt to discuss her sleep study results. No answer, left a message asking her to call me back. 

## 2019-09-17 ENCOUNTER — Other Ambulatory Visit (HOSPITAL_COMMUNITY)
Admission: RE | Admit: 2019-09-17 | Discharge: 2019-09-17 | Disposition: A | Discharge status: Home / Self Care | Payer: PPO | Source: Ambulatory Visit | Attending: Radiology | Admitting: Radiology

## 2019-09-17 ENCOUNTER — Ambulatory Visit
Admission: RE | Admit: 2019-09-17 | Discharge: 2019-09-17 | Disposition: A | Discharge status: Home / Self Care | Payer: PPO | Source: Ambulatory Visit | Attending: Family Medicine | Admitting: Family Medicine

## 2019-09-17 DIAGNOSIS — E041 Nontoxic single thyroid nodule: Secondary | ICD-10-CM | POA: Diagnosis not present

## 2019-09-18 DIAGNOSIS — M5030 Other cervical disc degeneration, unspecified cervical region: Secondary | ICD-10-CM | POA: Diagnosis not present

## 2019-09-18 DIAGNOSIS — M5481 Occipital neuralgia: Secondary | ICD-10-CM | POA: Diagnosis not present

## 2019-09-18 DIAGNOSIS — M797 Fibromyalgia: Secondary | ICD-10-CM | POA: Diagnosis not present

## 2019-09-19 LAB — CYTOLOGY - NON PAP

## 2019-09-22 ENCOUNTER — Telehealth: Payer: Self-pay | Admitting: Family Medicine

## 2019-09-22 NOTE — Telephone Encounter (Signed)
Thyroid biopsy was negative.  No sign of cancer.

## 2019-09-25 DIAGNOSIS — G4733 Obstructive sleep apnea (adult) (pediatric): Secondary | ICD-10-CM | POA: Diagnosis not present

## 2019-09-26 ENCOUNTER — Telehealth: Payer: Self-pay | Admitting: Neurology

## 2019-09-26 ENCOUNTER — Encounter: Payer: Self-pay | Admitting: Physical Therapy

## 2019-09-26 ENCOUNTER — Other Ambulatory Visit: Payer: Self-pay

## 2019-09-26 ENCOUNTER — Ambulatory Visit: Payer: PPO | Attending: Nurse Practitioner | Admitting: Physical Therapy

## 2019-09-26 DIAGNOSIS — Z9989 Dependence on other enabling machines and devices: Secondary | ICD-10-CM

## 2019-09-26 DIAGNOSIS — R2681 Unsteadiness on feet: Secondary | ICD-10-CM | POA: Diagnosis not present

## 2019-09-26 DIAGNOSIS — M542 Cervicalgia: Secondary | ICD-10-CM

## 2019-09-26 DIAGNOSIS — R42 Dizziness and giddiness: Secondary | ICD-10-CM | POA: Diagnosis not present

## 2019-09-26 DIAGNOSIS — G4733 Obstructive sleep apnea (adult) (pediatric): Secondary | ICD-10-CM

## 2019-09-26 NOTE — Telephone Encounter (Signed)
I will reduce her AutoPap setting from 7 to 13 cm down to 4 to 10 cm for better comfort and tolerance.  1 day compliance reviewed from 09/25/2019, AHI was 7.9, average pressure 11.9 cm, leak on the higher end.  Unfortunately, 1 day data is not enough to make any additional predictions.  For her tolerance and better comfort we will go down on the pressure range from now.

## 2019-09-26 NOTE — Telephone Encounter (Signed)
Pt called and stating that the cpap company told her to call her provider to get the air pressure fixed because it is to high for her and it caused her a headache and gas. Please advise.

## 2019-09-26 NOTE — Telephone Encounter (Signed)
I called pt and discussed this with her. Pt is agreeable to this and will try to use her auto pap every night for at least four hours. Pt verbalized understanding of recommendations. Order for new pressure sent to [employer] via community message. Confirmation received that the order transmitted was successful.
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
## 2019-10-07 ENCOUNTER — Telehealth: Payer: Self-pay | Admitting: Neurology

## 2019-10-07 ENCOUNTER — Institutional Professional Consult (permissible substitution): Payer: PPO | Admitting: Neurology

## 2019-10-07 ENCOUNTER — Encounter

## 2019-10-07 DIAGNOSIS — G4733 Obstructive sleep apnea (adult) (pediatric): Secondary | ICD-10-CM

## 2019-10-07 NOTE — Telephone Encounter (Signed)
I reviewed her AutoPap compliance data for the past 10 days, average AHI is around 10/h, 95th percentile of pressure 9.9, residual AHI primarily obstructive with a central component.  We will change her AutoPap pressure to a minimum pressure of 5 cm, max 12 cm.  Please notify patient, leak is high with average or 95th percentile of Leak at 39 L/min.  She will need a mask refit, please advise patient to talk to her DME company for a better mask fit.

## 2019-10-07 NOTE — Telephone Encounter (Signed)
Pt has called to inform she is having a hard time with her CPAP.  Pt is asking if the pressure can go up on her CPAP.  Pt also asking if the water temp can be cooler.  Please call

## 2019-10-07 NOTE — Telephone Encounter (Signed)
I called pt. She has changed to a FFM. She now feels that she is not getting enough pressure with the FFM. She wakes up with a sore throat. Her occipital neuralgia bothers her more while using auto pap because she cannot get comfortable. I recommended that she discuss the water temp and the mask with [employer]. Pt still wants Dr. Rexene Alberts to review the data and increase the pressure if necessary.

## 2019-10-07 NOTE — Addendum Note (Signed)
Addended by: Star Age on: 10/07/2019 02:25 PM   Modules accepted: Orders

## 2019-10-07 NOTE — Telephone Encounter (Signed)
I called pt and discussed this with her. She meet again with [employer] regarding her cpap concerns. Order for pressure change sent to [employer] via community message. Confirmation received that the order transmitted was successful.

## 2019-10-08 ENCOUNTER — Other Ambulatory Visit: Payer: Self-pay | Admitting: Family Medicine

## 2019-10-08 DIAGNOSIS — Z1231 Encounter for screening mammogram for malignant neoplasm of breast: Secondary | ICD-10-CM

## 2019-10-11 ENCOUNTER — Telehealth: Payer: Self-pay | Admitting: Neurology

## 2019-10-11 NOTE — Telephone Encounter (Signed)
Pt called stating that she is not able to sleep on the cpap machine and she is having neck pain and problems with her throat. Pt would like to discuss with RN. Please advise.

## 2019-10-14 NOTE — Telephone Encounter (Signed)
Noted, thank you

## 2019-10-14 NOTE — Telephone Encounter (Signed)
Due to ongoing difficulty with the autoPAP, I would recommend a proper PAP titration study. pls advise patient that I recommend she come in for a sleep study to help optimize her treatment. If she is okay with it, we can put order in.

## 2019-10-14 NOTE — Telephone Encounter (Signed)
I called pt. She does not want a cpap titration right now. She wants to discuss her neck pain with Dr. Sabra Heck, her neurologist, before deciding what to do with her auto pap. She also has an appt with [facility] to change her mask again this week. She will let us know if she changes her mind regarding the cpap titration study.

## 2019-10-24 ENCOUNTER — Encounter: Payer: Self-pay | Admitting: Physical Therapy

## 2019-10-24 ENCOUNTER — Other Ambulatory Visit: Payer: Self-pay

## 2019-10-24 ENCOUNTER — Ambulatory Visit: Payer: PPO | Admitting: Physical Therapy

## 2019-10-24 DIAGNOSIS — R2681 Unsteadiness on feet: Secondary | ICD-10-CM

## 2019-10-24 DIAGNOSIS — R42 Dizziness and giddiness: Secondary | ICD-10-CM

## 2019-10-24 DIAGNOSIS — M542 Cervicalgia: Secondary | ICD-10-CM | POA: Diagnosis not present
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
## 2019-10-24 NOTE — Patient Instructions (Signed)
Gaze Stabilization: Sitting    Keeping eyes on target on wall 3 feet away, and move head side to side for _30___ seconds. Repeat while moving head up and down for __30__ seconds. Do __2-3__ sessions per day - Morning, mid day, evening  Copyright  VHI. All rights reserved.   Gaze Stabilization: Tip Card  1.Target must remain in focus, not blurry, and appear stationary while head is in motion. 2.Perform exercises with small head movements (45 to either side of midline). 3.Increase speed of head motion so long as target is in focus. 4.If you wear eyeglasses, be sure you can see target through lens (therapist will give specific instructions for bifocal / progressive lenses). 5.These exercises may provoke dizziness or nausea. Work through these symptoms. If too dizzy, slow head movement slightly. Rest between each exercise. 6.Exercises demand concentration; avoid distractions.  Copyright  VHI. All rights reserved.

## 2019-10-25 DIAGNOSIS — G4733 Obstructive sleep apnea (adult) (pediatric): Secondary | ICD-10-CM | POA: Diagnosis not present

## 2019-10-28 DIAGNOSIS — G4733 Obstructive sleep apnea (adult) (pediatric): Secondary | ICD-10-CM | POA: Diagnosis not present

## 2019-10-30 ENCOUNTER — Ambulatory Visit: Payer: PPO | Attending: Nurse Practitioner | Admitting: Physical Therapy

## 2019-10-30 ENCOUNTER — Other Ambulatory Visit: Payer: Self-pay

## 2019-10-30 ENCOUNTER — Encounter: Payer: Self-pay | Admitting: Physical Therapy

## 2019-10-30 DIAGNOSIS — R2681 Unsteadiness on feet: Secondary | ICD-10-CM | POA: Diagnosis not present

## 2019-10-30 DIAGNOSIS — M542 Cervicalgia: Secondary | ICD-10-CM

## 2019-10-30 DIAGNOSIS — R42 Dizziness and giddiness: Secondary | ICD-10-CM | POA: Insufficient documentation

## 2019-10-30 NOTE — Patient Instructions (Signed)
Access Code: [email or web address]  URL: [email or web address]  Date: 10/30/2019  Prepared by: Misty Stanley   Exercises Prone W Scapular Retraction - 10 reps - 1 sets - 2x daily - 7x weekly Prone Shoulder Extension - 10 reps - 1 sets - 2x daily - 7x weekly Quadruped Thoracic Rotation with Hand on Neck - 10 reps - 2 sets - 1x daily - 7x weekly Quadruped Thoracic Rotation - Reach Under - 10 reps - 2 sets - 1x daily - 7x weekly Cat Cow - 10 reps - 1 sets - 1x daily - 7x weekly
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
## 2019-10-31 ENCOUNTER — Telehealth: Payer: Self-pay | Admitting: Neurology

## 2019-10-31 DIAGNOSIS — G4733 Obstructive sleep apnea (adult) (pediatric): Secondary | ICD-10-CM

## 2019-10-31 NOTE — Telephone Encounter (Signed)
Please print a 30-day PAP compliance report for my review, Natalie Erickson or Natalie Erickson may be able to assist.

## 2019-10-31 NOTE — Telephone Encounter (Signed)
I have printed report and placed in Dr. Guadelupe Sabin office for review.

## 2019-10-31 NOTE — Telephone Encounter (Signed)
We can reduce her autoPAP pressure to a Maximum pressure of 9 cm as opposed to 10 cm.  Her average pressure is right around 9 cm but we can reduce a little bit and see if she feels any better. I reviewed her compliance data for the past 30 days.  Her apnea score is averaging 3.9/h which is at goal. Please send order and notify pt.

## 2019-10-31 NOTE — Telephone Encounter (Signed)
Pt has called to inform that the pressure is too high in the CPAP, affecting throat and stomach.  Please call

## 2019-10-31 NOTE — Telephone Encounter (Signed)
I called pt about her issues with the Cpap  machine.  Pt stated when she wears the machine her stomach has a lot of gas in it, left ear hurts inside. Her nose and throat is dry. The muscles in her throat hurt while wearing the CPAP. Pt thinks the pressure is too high. I stated message will be sent to Dr Rexene Alberts. Pt verbalized understanding.

## 2019-11-01 ENCOUNTER — Other Ambulatory Visit: Payer: Self-pay

## 2019-11-01 ENCOUNTER — Ambulatory Visit: Payer: PPO | Admitting: Physical Therapy

## 2019-11-01 ENCOUNTER — Encounter: Payer: Self-pay | Admitting: Physical Therapy

## 2019-11-01 DIAGNOSIS — M542 Cervicalgia: Secondary | ICD-10-CM | POA: Diagnosis not present

## 2019-11-01 DIAGNOSIS — R2681 Unsteadiness on feet: Secondary | ICD-10-CM

## 2019-11-01 DIAGNOSIS — R42 Dizziness and giddiness: Secondary | ICD-10-CM

## 2019-11-01 NOTE — Patient Instructions (Addendum)
  Gaze Stabilization - Tip Card  1.Target must remain in focus, not blurry, and appear stationary while head is in motion. 2.Perform exercises with small head movements (45 to either side of midline). 3.Increase speed of head motion so long as target is in focus. 4.If you wear eyeglasses, be sure you can see target through lens (therapist will give specific instructions for bifocal / progressive lenses). 5.These exercises may provoke dizziness or nausea. Work through these symptoms. If too dizzy, slow head movement slightly. Rest between each exercise. 6.Exercises demand concentration; avoid distractions. 7.For safety, perform standing exercises close to a counter, wall, corner, or next to someone.  Copyright  VHI. All rights reserved.   Gaze Stabilization - Standing Feet Apart   Feet shoulder width apart, keeping eyes on target on wall 3 feet away, tilt head down slightly and move head side to side for 60 seconds. Repeat while moving head up and down for 60 seconds. Remember to ground down through your feet before starting [facility]). Do 2-3 sessions per day.   Over Head Pull: Wide Grip    On back, knees bent, feet flat, band across thighs, elbows straight but relaxed. Pull hands apart (start). Keeping elbows straight, bring arms up and over head, hands toward floor. Keep steady pull on band. Hold momentarily. Return slowly, keeping pull steady, back to start. Repeat __10_ times. Band color __green____    Side Pull: Double Arm    On back, knees bent, feet flat. Arms perpendicular to body, shoulder level, elbows straight but relaxed. Pull arms out to sides, elbows straight. Resistance band comes across collarbones, hands toward floor. Hold momentarily. Slowly return to starting position. Repeat _10__ times. Band color __green___    Sash    On back, knees bent, feet flat, left hand on left hip, right hand above left. Pull right arm DIAGONALLY (hip to shoulder) across chest.  Bring right arm along head toward floor. Hold momentarily. Slowly return to starting position. Repeat _10__ times each side. Do with left arm. Band color __green____     Shoulder Rotation: Single Arm    On back, knees bent, feet flat, elbows tucked at sides, bent 90, hands palms up. Keep left arm still. Pull right hand out to side and down toward floor, keeping elbow near side. Hold momentarily. Slowly return to starting position. Repeat __10_ times each side. Do with left arm. Band color __red left arm; green right arm____
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
## 2019-11-04 NOTE — Telephone Encounter (Addendum)
I called pt that her autopap pressure settings  were decrease from 10cm to 9cm. A  order will be sent to [facility].I stated hopefully this will help her with the autopap.Pt verbalized understanding.

## 2019-11-04 NOTE — Telephone Encounter (Signed)
I have sent order to [facility]. I called pt to discuss but she reports that someone from our office already called her this AM.

## 2019-11-04 NOTE — Telephone Encounter (Signed)
Revised. 

## 2019-11-11 ENCOUNTER — Other Ambulatory Visit: Payer: Self-pay

## 2019-11-11 ENCOUNTER — Encounter: Payer: Self-pay | Admitting: Physical Therapy

## 2019-11-11 ENCOUNTER — Ambulatory Visit: Payer: PPO | Admitting: Physical Therapy

## 2019-11-11 DIAGNOSIS — M542 Cervicalgia: Secondary | ICD-10-CM | POA: Diagnosis not present

## 2019-11-11 DIAGNOSIS — R2681 Unsteadiness on feet: Secondary | ICD-10-CM

## 2019-11-11 DIAGNOSIS — R42 Dizziness and giddiness: Secondary | ICD-10-CM

## 2019-11-11 NOTE — Patient Instructions (Addendum)
  Gaze Stabilization - Tip Card  1.Target must remain in focus, not blurry, and appear stationary while head is in motion. 2.Perform exercises with small head movements (45 to either side of midline). 3.Increase speed of head motion so long as target is in focus. 4.If you wear eyeglasses, be sure you can see target through lens (therapist will give specific instructions for bifocal / progressive lenses). 5.These exercises may provoke dizziness or nausea. Work through these symptoms. If too dizzy, slow head movement slightly. Rest between each exercise. 6.Exercises demand concentration; avoid distractions. 7.For safety, perform standing exercises close to a counter, wall, corner, or next to someone.  Copyright  VHI. All rights reserved.   Gaze Stabilization - Standing Feet Apart   Feet shoulder width apart, keeping eyes on target on wall 3 feet away, tilt head down slightly and move head side to side for 60 seconds. Repeat while moving head up and down for 60 seconds. Remember to ground down through your feet before starting [facility]). Do 2-3 sessions per day.   KEEP KNEES BENT  SPEND 3-5 MINUTES AT THE BEGINNING JUST WORKING ON BREATHING - IN FOR 4 SECONDS, OUT FOR 6 SECONDS.  BELLY SHOULD EXPAND WHEN BREATHING IN.  Diaphragmatic - Supine    Inhale through nose making navel move out toward hands. Exhale through puckered lips, hands follow navel in. Perform slowly for 3-4 minutes  GO SLOW AND FOCUS ON BREATHING  Over Head Pull: Wide Grip    On back, knees bent, feet flat, band across thighs, elbows straight but relaxed. Pull hands apart (start). Keeping elbows straight, bring arms up and over head, hands toward floor. Keep steady pull on band. Hold momentarily. Return slowly, keeping pull steady, back to start. Repeat __10_ times. Band color __green____    Side Pull: Double Arm    On back, knees bent, feet flat. Arms perpendicular to body, shoulder level, elbows  straight but relaxed. Pull arms out to sides, elbows straight. Resistance band comes across collarbones, hands toward floor. Hold momentarily. Slowly return to starting position. Repeat _10__ times. Band color __green___    Sash    On back, knees bent, feet flat, left hand on left hip, right hand above left. Pull right arm DIAGONALLY (hip to shoulder) across chest. Bring right arm along head toward floor. Hold momentarily. Slowly return to starting position. Repeat _10__ times each side. Do with left arm. Band color __green____     Shoulder Rotation: Single Arm    On back, KNEES BENT, feet flat, elbows tucked at sides, bent 90, hands palms up. Keep left arm still. Pull right hand out to side and down toward floor, keeping elbow near side. Hold momentarily. Slowly return to starting position. Repeat __10_ times each side. Do with left arm. Band color __red left arm; green right arm____    CHEST: Doorway, Unilateral - Standing    Standing in doorway, place one hand on wall with elbow STRAIGHT SLIGHTLY LOWER THAN shoulder height. ROTATE SLIGHTLY AWAY FROM SHOULDER. Hold _30__ seconds. __2_ reps per set, 2 SETS PER DAY, 7 DAYS PER WEEK  Copyright  VHI. All rights reserved.
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
## 2019-11-13 ENCOUNTER — Encounter: Payer: Self-pay | Admitting: Physical Therapy

## 2019-11-13 ENCOUNTER — Ambulatory Visit: Payer: PPO | Admitting: Physical Therapy

## 2019-11-13 ENCOUNTER — Other Ambulatory Visit: Payer: Self-pay

## 2019-11-13 DIAGNOSIS — M542 Cervicalgia: Secondary | ICD-10-CM | POA: Diagnosis not present

## 2019-11-13 DIAGNOSIS — R42 Dizziness and giddiness: Secondary | ICD-10-CM

## 2019-11-13 DIAGNOSIS — R2681 Unsteadiness on feet: Secondary | ICD-10-CM
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
## 2019-11-13 NOTE — Patient Instructions (Addendum)
In a comfortable chair - 10 minutes of knitting - keep your tongue touching the roof of the mouth to keep your jaw relaxed.     Gaze Stabilization - Tip Card  1.Target must remain in focus, not blurry, and appear stationary while head is in motion. 2.Perform exercises with small head movements (45 to either side of midline). 3.Increase speed of head motion so long as target is in focus. 4.If you wear eyeglasses, be sure you can see target through lens (therapist will give specific instructions for bifocal / progressive lenses). 5.These exercises may provoke dizziness or nausea. Work through these symptoms. If too dizzy, slow head movement slightly. Rest between each exercise. 6.Exercises demand concentration; avoid distractions. 7.For safety, perform standing exercises close to a counter, wall, corner, or next to someone.  Copyright  VHI. All rights reserved.   Gaze Stabilization - Standing Feet Apart   Feet shoulder width apart, keeping eyes on target on wall 3 feet away, tilt head down slightly and move head side to side for 60 seconds. Repeat while moving head up and down for 60 seconds. Remember to ground down through your feet before starting [facility]). Do 2-3 sessions per day.   KEEP KNEES BENT  SPEND 3-5 MINUTES AT THE BEGINNING JUST WORKING ON BREATHING - IN FOR 4 SECONDS, OUT FOR 6 SECONDS.  BELLY SHOULD EXPAND WHEN BREATHING IN.  Diaphragmatic - Supine    Inhale through nose making navel move out toward hands. Exhale through puckered lips, hands follow navel in. Perform slowly for 3-4 minutes  GO SLOW AND FOCUS ON BREATHING  Over Head Pull: Wide Grip    On back, knees bent, feet flat, band across thighs, elbows straight but relaxed. Pull hands apart (start). Keeping elbows straight, bring arms up and over head, hands toward floor. Keep steady pull on band. Hold momentarily. Return slowly, keeping pull steady, back to start. Repeat __10_ times. Band color  __green____    Side Pull: Double Arm    On back, knees bent, feet flat. Arms perpendicular to body, shoulder level, elbows straight but relaxed. Pull arms out to sides, elbows straight. Resistance band comes across collarbones, hands toward floor. Hold momentarily. Slowly return to starting position. Repeat _10__ times. Band color __green___    Sash    On back, knees bent, feet flat, left hand on left hip, right hand above left. Pull right arm DIAGONALLY (hip to shoulder) across chest. Bring right arm along head toward floor. Hold momentarily. Slowly return to starting position. Repeat _10__ times each side. Do with left arm. Band color __green____     Shoulder Rotation: Single Arm    On back, KNEES BENT, feet flat, elbows tucked at sides, bent 90, hands palms up. Keep left arm still. Pull right hand out to side and down toward floor, keeping elbow near side. Hold momentarily. Slowly return to starting position. Repeat __10_ times each side. Do with left arm. Band color __red left arm; green right arm____    CHEST: Doorway, Unilateral - Standing    Standing in doorway, place one hand on wall with elbow STRAIGHT SLIGHTLY LOWER THAN shoulder height. ROTATE SLIGHTLY AWAY FROM SHOULDER. Hold _30__ seconds. __2_ reps per set, 2 SETS PER DAY, 7 DAYS PER WEEK  Copyright  VHI. All rights reserved.

## 2019-11-18 ENCOUNTER — Encounter: Payer: Self-pay | Admitting: Physical Therapy

## 2019-11-18 ENCOUNTER — Ambulatory Visit: Payer: PPO | Admitting: Physical Therapy

## 2019-11-18 ENCOUNTER — Other Ambulatory Visit: Payer: Self-pay

## 2019-11-18 DIAGNOSIS — M542 Cervicalgia: Secondary | ICD-10-CM | POA: Diagnosis not present

## 2019-11-18 DIAGNOSIS — R42 Dizziness and giddiness: Secondary | ICD-10-CM

## 2019-11-18 DIAGNOSIS — R2681 Unsteadiness on feet: Secondary | ICD-10-CM
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
## 2019-11-18 NOTE — Patient Instructions (Addendum)
In a comfortable chair - 10 minutes of knitting - keep your tongue touching the roof of the mouth to keep your jaw relaxed.  May want to try heavy heating pad around neck/shoulders to use a feedback for when you are tensing your shoulders up.    Habituation - Tip Card  1.The goal of habituation training is to assist in decreasing symptoms of vertigo, dizziness, or nausea provoked by specific head and body motions. 2.These exercises may initially increase symptoms; however, be persistent and work through symptoms. With repetition and time, the exercises will assist in reducing or eliminating symptoms. 3.Exercises should be stopped and discussed with the therapist if you experience any of the following: - Sudden change or fluctuation in hearing - New onset of ringing in the ears, or increase in current intensity - Any fluid discharge from the ear - Severe pain in neck or back - Extreme nausea  Habituation - Sit to Side-Lying   Sit on edge of bed. Lie down onto the right side and hold until dizziness stops, plus 20 seconds.  Return to sitting and wait until dizziness stops, plus 20 seconds.  Repeat to the left side. Repeat sequence 5 times per session. Do 2 sessions per day.   Gaze Stabilization - Tip Card  1.Target must remain in focus, not blurry, and appear stationary while head is in motion. 2.Perform exercises with small head movements (45 to either side of midline). 3.Increase speed of head motion so long as target is in focus. 4.If you wear eyeglasses, be sure you can see target through lens (therapist will give specific instructions for bifocal / progressive lenses). 5.These exercises may provoke dizziness or nausea. Work through these symptoms. If too dizzy, slow head movement slightly. Rest between each exercise. 6.Exercises demand concentration; avoid distractions. 7.For safety, perform standing exercises close to a counter, wall, corner, or next to someone.  Copyright  VHI. All  rights reserved.   Gaze Stabilization - Standing Feet Apart   Feet together, keeping eyes on target on wall 3 feet away, tilt head down slightly and move head side to side for 30 seconds. Repeat while moving head up and down for 30 seconds. Remember to ground down through your feet before starting [facility]).  Keep shoulders relaxed. Do 2-3 sessions per day.   KEEP KNEES BENT  SPEND 3-5 MINUTES AT THE BEGINNING JUST WORKING ON BREATHING - IN FOR 4 SECONDS, OUT FOR 6 SECONDS.  BELLY SHOULD EXPAND WHEN BREATHING IN.  Diaphragmatic - Supine    Inhale through nose making navel move out toward hands. Exhale through puckered lips, hands follow navel in. Perform slowly for 3-4 minutes  GO SLOW AND FOCUS ON BREATHING  Over Head Pull: Wide Grip    On back, knees bent, feet flat, band across thighs, elbows straight but relaxed. Pull hands apart (start). Keeping elbows straight, bring arms up and over head, hands toward floor. Keep steady pull on band. Hold momentarily. Return slowly, keeping pull steady, back to start. Repeat __10_ times. Band color __green____    Side Pull: Double Arm    On back, knees bent, feet flat. Arms perpendicular to body, shoulder level, elbows straight but relaxed. Pull arms out to sides, elbows straight. Resistance band comes across collarbones, hands toward floor. Hold momentarily. Slowly return to starting position. Repeat _10__ times. Band color __green___    Sash    On back, knees bent, feet flat, left hand on left hip, right hand above left. Pull right  arm DIAGONALLY (hip to shoulder) across chest. Bring right arm along head toward floor. Hold momentarily. Slowly return to starting position. Repeat _10__ times each side. Do with left arm. Band color __green____     Shoulder Rotation: Single Arm    On back, KNEES BENT, feet flat, elbows tucked at sides, bent 90, hands palms up. Keep left arm still. Pull right hand out to side and down toward  floor, keeping elbow near side. Hold momentarily. Slowly return to starting position. Repeat __10_ times each side. Do with left arm. Band color __red left arm; green right arm____    CHEST: Doorway, Unilateral - Standing    Standing in doorway, place one hand on wall with elbow STRAIGHT SLIGHTLY LOWER THAN shoulder height. ROTATE SLIGHTLY AWAY FROM SHOULDER. Hold _30__ seconds. __2_ reps per set, 2 SETS PER DAY, 7 DAYS PER WEEK  Copyright  VHI. All rights reserved.

## 2019-11-20 ENCOUNTER — Other Ambulatory Visit: Payer: Self-pay

## 2019-11-20 ENCOUNTER — Encounter: Payer: Self-pay | Admitting: Physical Therapy

## 2019-11-20 ENCOUNTER — Ambulatory Visit: Payer: PPO | Admitting: Physical Therapy

## 2019-11-20 DIAGNOSIS — M542 Cervicalgia: Secondary | ICD-10-CM | POA: Diagnosis not present

## 2019-11-20 DIAGNOSIS — R2681 Unsteadiness on feet: Secondary | ICD-10-CM

## 2019-11-20 DIAGNOSIS — R42 Dizziness and giddiness: Secondary | ICD-10-CM

## 2019-11-20 NOTE — Patient Instructions (Signed)
In a comfortable chair - 10 minutes of knitting - keep your tongue touching the roof of the mouth to keep your jaw relaxed.  May want to try heavy heating pad around neck/shoulders to use a feedback for when you are tensing your shoulders up.   When performing household chores - try to keep it to 10-20 minutes at a time and break up the activity with Tai Chi, Yoga videos or stationary biking.  When biking - for now, don't watch the [employer] - put on music or a book on audio, etc to listen to while biking.    Habituation - Tip Card  1.The goal of habituation training is to assist in decreasing symptoms of vertigo, dizziness, or nausea provoked by specific head and body motions. 2.These exercises may initially increase symptoms; however, be persistent and work through symptoms. With repetition and time, the exercises will assist in reducing or eliminating symptoms. 3.Exercises should be stopped and discussed with the therapist if you experience any of the following: - Sudden change or fluctuation in hearing - New onset of ringing in the ears, or increase in current intensity - Any fluid discharge from the ear - Severe pain in neck or back - Extreme nausea  Habituation - Sit to Side-Lying   Sit on edge of bed. Lie down onto the right side and hold until dizziness stops, plus 20 seconds.  Return to sitting and wait until dizziness stops, plus 20 seconds.  Repeat to the left side. Repeat sequence 5 times per session. Do 2 sessions per day.   Gaze Stabilization - Tip Card  1.Target must remain in focus, not blurry, and appear stationary while head is in motion. 2.Perform exercises with small head movements (45 to either side of midline). 3.Increase speed of head motion so long as target is in focus. 4.If you wear eyeglasses, be sure you can see target through lens (therapist will give specific instructions for bifocal / progressive lenses). 5.These exercises may provoke dizziness or nausea. Work  through these symptoms. If too dizzy, slow head movement slightly. Rest between each exercise. 6.Exercises demand concentration; avoid distractions. 7.For safety, perform standing exercises close to a counter, wall, corner, or next to someone.  Copyright  VHI. All rights reserved.   Gaze Stabilization - Standing Feet Apart   Feet together, keeping eyes on target on wall 3 feet away, tilt head down slightly and move head side to side for 30 seconds. Repeat while moving head up and down for 30 seconds. Remember to ground down through your feet before starting [facility]).  Keep shoulders relaxed. Do 2-3 sessions per day.   KEEP KNEES BENT  SPEND 3-5 MINUTES AT THE BEGINNING JUST WORKING ON BREATHING - IN FOR 4 SECONDS, OUT FOR 6 SECONDS.  BELLY SHOULD EXPAND WHEN BREATHING IN.  Diaphragmatic - Supine    Inhale through nose making navel move out toward hands. Exhale through puckered lips, hands follow navel in. Perform slowly for 3-4 minutes  GO SLOW AND FOCUS ON BREATHING  Over Head Pull: Wide Grip    On back, knees bent, feet flat, band across thighs, elbows straight but relaxed. Pull hands apart (start). Keeping elbows straight, bring arms up and over head, hands toward floor. Keep steady pull on band. Hold momentarily. Return slowly, keeping pull steady, back to start. Repeat __10_ times. Band color __green____    Side Pull: Double Arm    On back, knees bent, feet flat. Arms perpendicular to body, shoulder level, elbows  straight but relaxed. Pull arms out to sides, elbows straight. Resistance band comes across collarbones, hands toward floor. Hold momentarily. Slowly return to starting position. Repeat _10__ times. Band color __green___    Sash    On back, knees bent, feet flat, left hand on left hip, right hand above left. Pull right arm DIAGONALLY (hip to shoulder) across chest. Bring right arm along head toward floor. Hold momentarily. Slowly return to starting  position. Repeat _10__ times each side. Do with left arm. Band color __green____     Shoulder Rotation: Single Arm    On back, KNEES BENT, feet flat, elbows tucked at sides, bent 90, hands palms up. Keep left arm still. Pull right hand out to side and down toward floor, keeping elbow near side. Hold momentarily. Slowly return to starting position. Repeat __10_ times each side. Do with left arm. Band color __red left arm; green right arm____    CHEST: Doorway, Unilateral - Standing    Standing in doorway, place one hand on wall with elbow STRAIGHT SLIGHTLY LOWER THAN shoulder height. ROTATE SLIGHTLY AWAY FROM SHOULDER. Hold _30__ seconds. __2_ reps per set, 2 SETS PER DAY, 7 DAYS PER WEEK  Copyright  VHI. All rights reserved.
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
## 2019-11-25 DIAGNOSIS — G4733 Obstructive sleep apnea (adult) (pediatric): Secondary | ICD-10-CM | POA: Diagnosis not present

## 2019-11-29 ENCOUNTER — Other Ambulatory Visit: Payer: Self-pay

## 2019-11-29 ENCOUNTER — Encounter: Payer: Self-pay | Admitting: Physical Therapy

## 2019-11-29 ENCOUNTER — Ambulatory Visit: Payer: PPO | Attending: Nurse Practitioner | Admitting: Physical Therapy

## 2019-11-29 DIAGNOSIS — R2681 Unsteadiness on feet: Secondary | ICD-10-CM | POA: Diagnosis not present

## 2019-11-29 DIAGNOSIS — M542 Cervicalgia: Secondary | ICD-10-CM | POA: Diagnosis not present

## 2019-11-29 DIAGNOSIS — R42 Dizziness and giddiness: Secondary | ICD-10-CM | POA: Diagnosis not present
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
## 2019-11-29 NOTE — Patient Instructions (Addendum)
Gaze Stabilization: Standing Feet Apart (Compliant Surface)    Feet apart on pillow, keeping eyes on target on wall __3__ feet away, tilt head down 15-30 and move head side to side for _30___ seconds. Repeat while moving head up and down for _30_ seconds. Do _2-3_ sessions per day. In a comfortable chair - 10 minutes of knitting - keep your tongue touching the roof of the mouth to keep your jaw relaxed.  May want to try heavy heating pad around neck/shoulders to use a feedback for when you are tensing your shoulders up.   When performing household chores - try to keep it to 10-20 minutes at a time and break up the activity with Tai Chi, Yoga videos or stationary biking.  When biking - for now, don't watch the [place] - put on music or a book on audio, etc to listen to while biking.     KEEP KNEES BENT  SPEND 3-5 MINUTES AT THE BEGINNING JUST WORKING ON BREATHING - IN FOR 4 SECONDS, OUT FOR 6 SECONDS.  BELLY SHOULD EXPAND WHEN BREATHING IN.  Diaphragmatic - Supine    Inhale through nose making navel move out toward hands. Exhale through puckered lips, hands follow navel in. Perform slowly for 3-4 minutes  GO SLOW AND FOCUS ON BREATHING  Over Head Pull: Wide Grip    On back, knees bent, feet flat, band across thighs, elbows straight but relaxed. Pull hands apart (start). Keeping elbows straight, bring arms up and over head, hands toward floor. Keep steady pull on band. Hold momentarily. Return slowly, keeping pull steady, back to start. Repeat __10_ times. Band color __green____    Side Pull: Double Arm    On back, knees bent, feet flat. Arms perpendicular to body, shoulder level, elbows straight but relaxed. Pull arms out to sides, elbows straight. Resistance band comes across collarbones, hands toward floor. Hold momentarily. Slowly return to starting position. Repeat _10__ times. Band color __green___    Sash    On back, knees bent, feet flat, left hand on left hip, right  hand above left. Pull right arm DIAGONALLY (hip to shoulder) across chest. Bring right arm along head toward floor. Hold momentarily. Slowly return to starting position. Repeat _10__ times each side. Do with left arm. Band color __green____     Shoulder Rotation: Single Arm    On back, KNEES BENT, feet flat, elbows tucked at sides, bent 90, hands palms up. Keep left arm still. Pull right hand out to side and down toward floor, keeping elbow near side. Hold momentarily. Slowly return to starting position. Repeat __10_ times each side. Do with left arm. Band color __red left arm; green right arm____    CHEST: Doorway, Unilateral - Standing    Standing in doorway, place one hand on wall with elbow STRAIGHT SLIGHTLY LOWER THAN shoulder height. ROTATE SLIGHTLY AWAY FROM SHOULDER. Hold _30__ seconds. __2_ reps per set, 2 SETS PER DAY, 7 DAYS PER WEEK   Habituation - Tip Card  1.The goal of habituation training is to assist in decreasing symptoms of vertigo, dizziness, or nausea provoked by specific head and body motions. 2.These exercises may initially increase symptoms; however, be persistent and work through symptoms. With repetition and time, the exercises will assist in reducing or eliminating symptoms. 3.Exercises should be stopped and discussed with the therapist if you experience any of the following: - Sudden change or fluctuation in hearing - New onset of ringing in the ears, or increase in current intensity -  Any fluid discharge from the ear - Severe pain in neck or back - Extreme nause  Habituation - Sit to Side-Lying   Sit on edge of bed. Lie down onto the right side and hold until dizziness stops, plus 20 seconds.  Return to sitting and wait until dizziness stops, plus 20 seconds.  Repeat to the left side. Repeat sequence 5 times per session. Do 2 sessions per day.

## 2019-12-02 ENCOUNTER — Telehealth: Payer: Self-pay | Admitting: Physician Assistant

## 2019-12-02 ENCOUNTER — Ambulatory Visit: Payer: PPO | Admitting: Physical Therapy

## 2019-12-02 MED ORDER — HYDROCORTISONE ACE-PRAMOXINE 1-1 % EX CREA: 1.0000 "application " | TOPICAL_CREAM | Freq: Two times a day (BID) | CUTANEOUS | 1 refills | Status: DC

## 2019-12-02 NOTE — Telephone Encounter (Signed)
Dr Loletha Carrow please advise in Natalie Erickson's absence. Spoke with the patient. She states she had severe constipation a week ago. This triggered issues with her hemorrhoids. She is no longer having any bleeding with bowel movements. She continues to have the itching irritation and discomfort at the anal area.  Her self-care consists of sitz baths, OTC Preparation H suppositories and maintaining softer stools. Patient is asking for Rx treatment.

## 2019-12-02 NOTE — Telephone Encounter (Signed)
Reviewed the plan with the patient. She agrees to this plan of care.

## 2019-12-02 NOTE — Telephone Encounter (Signed)
I prescribed some cream that can be applied to the anal area for relief.  Continue the suppositories for another 3-5 days, and maintain soft stool with miralax as needed.

## 2019-12-05 ENCOUNTER — Other Ambulatory Visit: Payer: Self-pay

## 2019-12-05 ENCOUNTER — Encounter: Payer: Self-pay | Admitting: Physical Therapy

## 2019-12-05 ENCOUNTER — Ambulatory Visit: Payer: PPO | Admitting: Physical Therapy

## 2019-12-05 DIAGNOSIS — R42 Dizziness and giddiness: Secondary | ICD-10-CM

## 2019-12-05 DIAGNOSIS — M542 Cervicalgia: Secondary | ICD-10-CM

## 2019-12-05 DIAGNOSIS — R2681 Unsteadiness on feet: Secondary | ICD-10-CM
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
## 2019-12-05 NOTE — Patient Instructions (Addendum)
Gaze Stabilization: Standing Feet Apart (Compliant Surface)    Place letter on a busy background (wrapping paper, wall paper, window, etc).  Feet together on pillow, keeping eyes on target on wall __3__ feet away, tilt head down 15-30 and move head side to side for _60___ seconds. Repeat while moving head up and down for _60_ seconds. Do _2-3_ sessions per day.    Access Code: [email or web address]  URL: [email or web address]  Date: 12/05/2019  Prepared by: Misty Stanley   Exercises Standing Shoulder External Rotation Stretch in Doorway - 3 sets - 30 second hold - 1x daily - 7x weekly Seated Levator Scapulae Stretch - 2 sets - 30 second hold - 1x daily - 7x weekly Seated Cervical Sidebending Stretch - 2 sets - 30 second hold - 1x daily - 7x weekly Seated Shoulder Flexion Full Range - 8-10 reps - 1 sets - 1x daily - 7x weekly Seated Shoulder Horizontal Abduction with Resistance - 8-10 reps - 1 sets - 1x daily - 7x weekly Seated Shoulder Diagonal with Resistance - 8-10 reps - 2 sets - 1x daily - 7x weekly Seated Single Arm Shoulder External Rotation with Self-Anchored Resistance - 10 reps - 2 sets - 1x daily - 7x weekly

## 2019-12-06 NOTE — Telephone Encounter (Signed)
IF she has a preference for a topical cream that is fine. Not sure what Dr. Loletha Carrow gave her. For hemorrhoids I would use Anusol suppository or 1% hydrocortisone cream BID until her clinic visit, whichever her preference. Thanks

## 2019-12-06 NOTE — Telephone Encounter (Signed)
Doc of the Day See earlier notes please. Appointment moved to 12/11/19. Patient will continue with her supportive care as discussed 12/02/19. No changes in her symptoms. They have persisted. Wants to try a different suppository. It is a zinc oxide and cocoa butter suppository. She is not having any rectal blood. She is maintaining soft bowel movements. Sitz bathes are comforting. Feels "pressure" in her rectum when she is up walking. She has pain with the bowel movement that is relieved with a sitz bath. She feels very strongly this is a hemorroid issue. Suggestions? She understands that it is difficult to be certain of a treatment until she is seen, but wanted me to ask.

## 2019-12-06 NOTE — Telephone Encounter (Signed)
Pt stated that rectal cream is not helping and asked to be seen ASAP.

## 2019-12-09 ENCOUNTER — Ambulatory Visit: Payer: PPO | Admitting: Physical Therapy

## 2019-12-09 ENCOUNTER — Encounter: Payer: Self-pay | Admitting: Physical Therapy

## 2019-12-09 ENCOUNTER — Other Ambulatory Visit: Payer: Self-pay

## 2019-12-09 ENCOUNTER — Ambulatory Visit
Admission: RE | Admit: 2019-12-09 | Discharge: 2019-12-09 | Disposition: A | Discharge status: Home / Self Care | Payer: PPO | Source: Ambulatory Visit | Attending: Family Medicine | Admitting: Family Medicine

## 2019-12-09 DIAGNOSIS — R42 Dizziness and giddiness: Secondary | ICD-10-CM

## 2019-12-09 DIAGNOSIS — Z1231 Encounter for screening mammogram for malignant neoplasm of breast: Secondary | ICD-10-CM | POA: Diagnosis not present

## 2019-12-09 DIAGNOSIS — R2681 Unsteadiness on feet: Secondary | ICD-10-CM

## 2019-12-09 DIAGNOSIS — M542 Cervicalgia: Secondary | ICD-10-CM

## 2019-12-09 NOTE — Telephone Encounter (Signed)
I have no experience using such suppositories.  I understand she is having trouble. However, it is difficult to recommend any other treatment until she is examined in clinic this week.

## 2019-12-09 NOTE — Telephone Encounter (Signed)
Dr Loletha Carrow She is scheduled with Nicoletta Ba this week. Do you feel the zinc oxide cocoa butter suppositories are okay for her to use? Should she wait for a diagnosis before she uses them?

## 2019-12-09 NOTE — Telephone Encounter (Signed)
Called the patient. No answer. Left the information on her voicemail.
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
## 2019-12-11 ENCOUNTER — Ambulatory Visit (INDEPENDENT_AMBULATORY_CARE_PROVIDER_SITE_OTHER): Payer: PPO | Admitting: Family Medicine

## 2019-12-11 ENCOUNTER — Encounter: Payer: Self-pay | Admitting: Physician Assistant

## 2019-12-11 ENCOUNTER — Encounter: Payer: Self-pay | Admitting: Family Medicine

## 2019-12-11 ENCOUNTER — Other Ambulatory Visit: Payer: Self-pay

## 2019-12-11 ENCOUNTER — Ambulatory Visit: Payer: PPO | Admitting: Physician Assistant

## 2019-12-11 VITALS — BP 126/72 | Temp 97.9°F | Ht 62.0 in | Wt 142.0 lb

## 2019-12-11 VITALS — BP 123/74 | HR 71 | Temp 97.1°F | Ht 62.0 in | Wt 143.0 lb

## 2019-12-11 DIAGNOSIS — K648 Other hemorrhoids: Secondary | ICD-10-CM | POA: Diagnosis not present

## 2019-12-11 DIAGNOSIS — G4733 Obstructive sleep apnea (adult) (pediatric): Secondary | ICD-10-CM

## 2019-12-11 DIAGNOSIS — Z9989 Dependence on other enabling machines and devices: Secondary | ICD-10-CM | POA: Diagnosis not present

## 2019-12-11 NOTE — Progress Notes (Signed)
Subjective:    Patient ID: Natalie Erickson, female    DOB: 1951/01/15, 68 y.o.   MRN: [id number]  HPI Natalie Erickson is a very nice 68 year old white female, known to Dr. Loletha Carrow and myself.  She has history of interstitial cystitis, anxiety, positive ANA, fibromyalgia, IBS and occipital neuralgia.  Also with history of colon polyps. She comes in today with 2 to 3-week history of hemorrhoidal type symptoms.  She said she had become constipated, used a glycerin suppository, had a lot of straining and eventually passed a hard stool.  She had a little bit of bleeding at that time as well as some low pelvic discomfort and rectal pressure. Since then she has had some persistent anal rectal discomfort.  She says she has a protrusion from her rectum that resolves with sits baths but then recurs after she gets up and around. She has been doing sitz bath's regularly at least a couple of times per day and feels that that has helped.  Over the past week her symptoms are easing.  She is also started using MiraLAX on a daily basis which is helping a lot.  She says she is having large bowel movements are which are very easy to pass. She has been using over-the-counter Preparation H ointment externally and Preparation H suppositories had been used for about 5 days after onset. Last colonoscopy was done in 2016 in [place].  Patient was told that she had polyps and was indicated for 5-year interval follow-up.  We had planned follow-up colonoscopy in August 2021.  Review of Systems.Pertinent positive and negative review of systems were noted in the above HPI section.  All other review of systems was otherwise negative.  Outpatient Encounter Medications as of 12/11/2019  Medication Sig  . acetaminophen (TYLENOL) 500 MG tablet Take 1,000 mg by mouth every 6 (six) hours as needed for headache (pain).  . AMBULATORY NON FORMULARY MEDICATION Take 1 tablet by mouth as needed. Medication Name: [name]  . Cholecalciferol  (VITAMIN D) 2000 units tablet Take 2,000 Units by mouth daily.   Marland Kitchen docusate sodium (COLACE) 50 MG capsule Take 50 mg by mouth as needed for mild constipation.  . gabapentin (NEURONTIN) 100 MG capsule Take 200 mg by mouth 3 (three) times daily.   Marland Kitchen MAGNESIUM GLUCONATE PO Take 200 mg by mouth as directed.   [name] (SYSTANE ULTRA OP) Apply to eye 4 (four) times daily.  . pramoxine-hydrocortisone (PROCTOCREAM-HC) 1-1 % rectal cream Place 1 application rectally 2 (two) times daily.  Marland Kitchen pyridOXINE (B-6) 50 MG tablet Take 50 mg by mouth daily.   No facility-administered encounter medications on file as of 12/11/2019.   Allergies  Allergen Reactions  . Diltiazem Shortness Of Breath  . Metoprolol Shortness Of Breath  . Statins Tinitus    Achy joints, muscle aches Achy joints, muscle aches  . Chocolate Other (See Comments)    migraine  . Cocoa Other (See Comments)    migraine migraine  . Codeine Nausea And Vomiting  . Cranberry Other (See Comments)    Cystitis  . Latex Rash  . Monosodium Glutamate Other (See Comments)    Migraine  . Penicillins Rash    Has patient had a PCN reaction causing immediate rash, facial/tongue/throat swelling, SOB or lightheadedness with hypotension: Yes Has patient had a PCN reaction causing severe rash involving mucus membranes or skin necrosis: No Has patient had a PCN reaction that required hospitalization No Has patient had a PCN reaction  occurring within the last 10 years: No If all of the above answers are "NO", then may proceed with Cephalosporin use.   Marland Kitchen Antihistamines, Loratadine-Type     Throat swelling  . Carvedilol Other (See Comments)    N&N, SOB , Head ache and joint pain.   . Eggs Or Egg-Derived Products   . Ginger   . Gluten Meal   . Lexapro [Escitalopram]     GI   . Montelukast Other (See Comments)    Throat swelling  . Other Swelling    Throat swelling  . Prednisone Swelling    Throat swelling (no difficulty  breathing)  . Sulfites Other (See Comments)    Nausea and headaches  . Vanilla   . Whey   . Zofran [Ondansetron Hcl] Other (See Comments)    Muscle cramps, leg tingling  . Azithromycin Other (See Comments) and Diarrhea   Patient Active Problem List   Diagnosis Date Noted  . Abnormal imaging of thyroid 08/04/2019  . Cervicalgia 07/12/2019  . 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 Anxiety about health 07/12/2019  . 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 Change in stool 11/14/2018  . Posterior vitreous detachment of right eye 09/10/2018  . History of IBS 01/30/2018  . Urolithiasis 06/23/2017  . Fibromyalgia 06/04/2017  . Chronic fatigue 06/04/2017  . Medication intolerance 06/04/2017  . ANA positive 05/20/2017  . 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 Serum calcium elevated 05/20/2017  . Interstitial cystitis 05/20/2017  . Dyspepsia 05/20/2017  . Mixed hyperlipidemia 04/06/2017  . Elevated BP without diagnosis of hypertension 04/06/2017  . Anxiety 04/06/2017  . Dry eyes 04/06/2017   Social History   Socioeconomic History  . Marital status: Married    Spouse name: Not on file  . Number of children: 2  . Years of education: Therapist, sports  . Highest education level: Not on file  Occupational History  . Occupation: Retired  Tobacco Use  . Smoking status: Never Smoker  . Smokeless tobacco: Never Used  Substance and Sexual Activity  . Alcohol use: No  . Drug use: No  . Sexual activity: Yes  Other Topics Concern  . 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 Difficulty of Paying Living Expenses: Not on file  Food Insecurity:   . Worried About Charity fundraiser in the Last Year: Not on file  . Ran Out of Food in the Last Year: Not on file    Transportation Needs:   . Lack of Transportation (Medical): Not on file  . Lack of Transportation (Non-Medical): Not on file  Physical Activity:   . Days of Exercise per Week: Not on file  . Minutes of Exercise per Session: Not on file  Stress:   . Feeling of Stress : Not on file  Social Connections:   . Frequency of Communication with Friends and Family: Not on file  . Frequency of Social Gatherings with Friends and Family: Not on file  . Attends Religious Services: Not on file  . Active Member of Clubs or Organizations: Not on file  . Attends Archivist Meetings: Not on file  . Marital Status: Not on file  Intimate Partner Violence:   . Fear of  Current or Ex-Partner: Not on file  . Emotionally Abused: Not on file  . Physically Abused: Not on file  . Sexually Abused: Not on file    Natalie Erickson's family history includes Breast cancer in her sister; Diabetes in her paternal grandmother; Healthy in her father and mother; Hypertension in her mother; Lung cancer in her father.      Objective:    Vitals:   12/11/19 1447  BP: 126/72  Temp: 97.9 F (36.6 C)    Physical Exam Well-developed well-nourished white female in no acute distress.  Pleasant , Weight, 142 BMI 25.9  HEENT; nontraumatic normocephalic, EOMI, PER R LA, sclera anicteric. Oropharynx; not examined/mask/Covid Neck; supple, no JVD Cardiovascular; regular rate and rhythm with S1-S2, no murmur rub or gallop Pulmonary; Clear bilaterally Abdomen; soft, nontender, nondistended, no palpable mass or hepatosplenomegaly, bowel sounds are active Rectal; noninflamed external hemorrhoids, there is no inflamed internal hemorrhoid at the anal verge, on anoscopy and digital exam she has 1 fairly large edematous internal hemorrhoid Skin; benign exam, no jaundice rash or appreciable lesions  Neuro/Psych; alert and oriented x4, grossly nonfocal mood and affect appropriate       Assessment & Plan:   #76 68 year old  female with symptomatic edematous internal hemorrhoid, grade 2.  #2 IBS 3.  Prior history of colon polyps-due for follow-up colonoscopy 2021 4.  Fibromyalgia 5.  Interstitial cystitis 6.  Occipital neuralgia  Plan; continue MiraLAX 17 g in 8 ounces of water daily. Continue Preparation H suppositories, advised twice daily for another 5 days, then nightly x5 days.  She may repeat course if symptoms are persisting or recur. She had read the information regarding hemorrhoidal banding.  We discussed that if she were to have ongoing recurrences or persistence of symptoms that banding may be appropriate but not necessary at present. She will call back in 4 weeks if symptoms have not significantly resolved. Patient aware she will be due for colonoscopy summer 2021  Alfredia Ferguson PA-C 12/11/2019   Cc: Briscoe Deutscher, DO

## 2019-12-11 NOTE — Progress Notes (Addendum)
PATIENT: Natalie Erickson DOB: March 21, 1951  REASON FOR VISIT: follow up HISTORY FROM: patient  Chief Complaint  Patient presents with  . Follow-up    Initial cpap f/u. Alone. Rm 1. Patient mentioned that she thinks the pressure is still set to high because she wakes up in the middle of the night with a stomach ache. Otherwise she stated that she sleeps well with her cpap.     HISTORY OF PRESENT ILLNESS: Today 12/11/19 Quentin Abramowicz is a 68 y.o. female here today for follow up of OSA on CPAP.  Kaylean is doing very well on CPAP therapy.  She is using CPAP nightly.  She does note significant improvement in daytime sleepiness.  She has less fatigue throughout the day.  She notes improvement in occipital neuralgia.  She is currently managed by Dr. Sabra Heck, [facility] neurology.  Her only concern is that occasionally she will wake up at night with what she describes as gas pains.  She is also noted belching.  She is currently using a nasal pillow.  She does have a chinstrap but has not used this.  She is concerned that pressure settings are too high.  Compliance report dated 11/10/2019 through 12/09/2019 revealed that she is using CPAP every night for compliance of 100%.  Every night she used CPAP greater than 4 hours for compliance of 100%.  On average she is using CPAP 7 hours and 49 minutes.  Residual AHI 0.6 on 4 to 9 cm of water.  Pressure in the 95th percentile of 8.2 with maximum of 8.8.  There is no significant leak.  HISTORY: (copied from Dr Guadelupe Sabin note on 08/12/2019)  Dear Dr. Juleen China,   I saw your patient, Talanda Ferriell, upon your kind request to my sleep clinic today for initial consultation of her sleep disorder, in particular, concern for sleep disordered breathing.  The patient is unaccompanied today.  As you know, Ms. Solivan is a 68 year old right-handed woman with an underlying medical history of irritable bowel syndrome, history of heart murmur, fibromyalgia, depression, anxiety, and  hyperlipidemia, who reports snoring and excessive daytime somnolence.  I reviewed your office note from 07/12/2019.  She has had some morning headaches.  These are dull and achy.  She had seen Dr. Jaynee Eagles in our office in 2018 for headaches. Her Epworth sleepiness score is 9 out of 24, fatigue severity score is 52 out of 63.  She reports that her husband has noted some snorting sounds, these became worse when she was tried on baclofen for neck pain.  She had a recent C-spine MRI and x-ray in July 2020.  She brought copies of the results for my review.  Her cervical spine x-ray showed multilevel degenerative disc disease.  Her MRI neck showed multilevel cervical spondylosis, most pronounced at C5-6.  She had mild canal stenosis and moderate left foraminal narrowing she was noted to have a goiter.  She is going to see a new orthopedic doctor this week. She reports that she is supposed to have a thyroid biopsy.  She has had some depressive symptoms.  She reports side effects on antidepressant medications.  She has intermittent restless leg symptoms and takes magnesium for these symptoms as well as cramping.  She does not have nightly restless legs symptoms.  She does have nocturia once or twice per average night.  She lives with her husband.  They live with their son and his family including wife and 2 grandchildren.  She has another son  in [place].  Her son that lives here has sleep apnea and has a CPAP machine.  She reports a bedtime around 930 and rise time between 630 and 7 AM.  Years ago she tried a custom-made bite guard which broke.  She did not think it helped.  She does not believe it was made for snoring or for sleep apnea but more for grinding.  She does not drink caffeine or alcohol.  She does not smoke.   REVIEW OF SYSTEMS: Out of a complete 14 system review of symptoms, the patient complains only of the following symptoms, eye redness, light sensitivity, ringing in ears, palpitations, constipation,  food allergies, neck pain, neck stiffness, nervous/anxious and all other reviewed systems are negative.  Epworth sleepiness scale: 2 Fatigue severity scale: 12  ALLERGIES: Allergies  Allergen Reactions  . Diltiazem Shortness Of Breath  . Metoprolol Shortness Of Breath  . Statins Tinitus    Achy joints, muscle aches Achy joints, muscle aches  . Chocolate Other (See Comments)    migraine  . Cocoa Other (See Comments)    migraine migraine  . Codeine Nausea And Vomiting  . Cranberry Other (See Comments)    Cystitis  . Latex Rash  . Monosodium Glutamate Other (See Comments)    Migraine  . Penicillins Rash    Has patient had a PCN reaction causing immediate rash, facial/tongue/throat swelling, SOB or lightheadedness with hypotension: Yes Has patient had a PCN reaction causing severe rash involving mucus membranes or skin necrosis: No Has patient had a PCN reaction that required hospitalization No Has patient had a PCN reaction occurring within the last 10 years: No If all of the above answers are "NO", then may proceed with Cephalosporin use.   Marland Kitchen Antihistamines, Loratadine-Type     Throat swelling  . Carvedilol Other (See Comments)    N&N, SOB , Head ache and joint pain.   . Eggs Or Egg-Derived Products   . Ginger   . Gluten Meal   . Lexapro [Escitalopram]     GI   . Montelukast Other (See Comments)    Throat swelling  . Other Swelling    Throat swelling  . Prednisone Swelling    Throat swelling (no difficulty breathing)  . Sulfites Other (See Comments)    Nausea and headaches  . Vanilla   . Whey   . Zofran [Ondansetron Hcl] Other (See Comments)    Muscle cramps, leg tingling  . Azithromycin Other (See Comments) and Diarrhea    HOME MEDICATIONS: Outpatient Medications Prior to Visit  Medication Sig Dispense Refill  . acetaminophen (TYLENOL) 500 MG tablet Take 1,000 mg by mouth every 6 (six) hours as needed for headache (pain).    . AMBULATORY NON FORMULARY  MEDICATION Take 1 tablet by mouth as needed. Medication Name: [name]    . Cholecalciferol (VITAMIN D) 2000 units tablet Take 2,000 Units by mouth daily.     Marland Kitchen docusate sodium (COLACE) 50 MG capsule Take 50 mg by mouth as needed for mild constipation.    . gabapentin (NEURONTIN) 100 MG capsule Take 200 mg by mouth 3 (three) times daily.     Marland Kitchen MAGNESIUM GLUCONATE PO Take 200 mg by mouth as directed.     [name] (SYSTANE ULTRA OP) Apply to eye 4 (four) times daily.    . pramoxine-hydrocortisone (PROCTOCREAM-HC) 1-1 % rectal cream Place 1 application rectally 2 (two) times daily. 30 g 1  . pyridOXINE (B-6) 50 MG tablet Take  50 mg by mouth daily.     No facility-administered medications prior to visit.    PAST MEDICAL HISTORY: Past Medical History:  Diagnosis Date  . Anxiety 04/06/2017  . Cystitis   . Depression   . Dry eyes 04/06/2017  . Elevated BP without diagnosis of hypertension 04/06/2017   lost weight >> BP improved  . Fibromyalgia   . Heart murmur    hx of 2 Echocardiograms in [place] (pt was told they were normal)  . Hiatal hernia   . History of colon polyps   . History of echocardiogram    Echo 9/19: mild LVH, EF 60-65, no RWMA, Gr 1 DD, trivial MR/TR, PASP 16  . History of Lyme disease   . IBS (irritable bowel syndrome)   . Mixed hyperlipidemia 04/06/2017   intol to statins due to myalgias; never tried Zetia  . TMJ (dislocation of temporomandibular joint)   . Vasomotor rhinitis     PAST SURGICAL HISTORY: Past Surgical History:  Procedure Laterality Date  . APPENDECTOMY  1959  . LAPAROSCOPY    . LEFT OOPHORECTOMY      FAMILY HISTORY: Family History  Problem Relation Age of Onset  . Healthy Mother   . Healthy Father   . Breast cancer Sister   . Diabetes Paternal Grandmother   . Hypertension Mother   . Lung cancer Father   . Colon cancer Neg Hx   . Stomach cancer Neg Hx   . Heart attack Neg Hx   . Heart failure Neg Hx      SOCIAL HISTORY: Social History   Socioeconomic History  . Marital status: Married    Spouse name: Not on file  . Number of children: 2  . Years of education: Therapist, sports  . Highest education level: Not on file  Occupational History  . Occupation: Retired  Tobacco Use  . Smoking status: Never Smoker  . Smokeless tobacco: Never Used  Substance and Sexual Activity  . Alcohol use: No  . Drug use: No  . Sexual activity: Yes  Other Topics Concern  . 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 Difficulty of Paying Living Expenses: Not on file  Food Insecurity:   . Worried About Charity fundraiser in the Last Year: Not on file  . Ran Out of Food in the Last Year: Not on file  Transportation Needs:   . Lack of Transportation (Medical): Not on file  . Lack of Transportation (Non-Medical): Not on file  Physical Activity:   . Days of Exercise per Week: Not on file  . Minutes of Exercise per Session: Not on file  Stress:   . Feeling of Stress : Not on file  Social Connections:   . Frequency of Communication with Friends and Family: Not on file  . Frequency of Social Gatherings with Friends and Family: Not on file  . Attends Religious Services: Not on file  . Active Member of Clubs or Organizations: Not on file  . Attends Archivist Meetings: Not on file  . Marital Status: Not on file  Intimate Partner Violence:   . Fear of Current or Ex-Partner: Not on file  . Emotionally Abused: Not on  file  . Physically Abused: Not on file  . Sexually Abused: Not on file      PHYSICAL EXAM  Vitals:   12/11/19 1044  BP: 123/74  Pulse: 71  Temp: (!) 97.1 F (36.2 C)  TempSrc: Oral  Weight: 143 lb (64.9 kg)  Height: 5\' 2"  (1.575 m)    Body mass index is 26.16 kg/m.  Generalized: Well developed, in no acute distress  Cardiology: normal rate and rhythm, no murmur noted Respiratory: Clear to auscultation bilaterally Neurological examination  Mentation: Alert oriented to time, place, history taking. Follows all commands speech and language fluent Cranial nerve II-XII: Pupils were equal round reactive to light. Extraocular movements were full, visual field were full on confrontational test.  Motor: The motor testing reveals 5 over 5 strength of all 4 extremities. Good symmetric motor tone is noted throughout.  Gait and station: Gait is normal.   DIAGNOSTIC DATA (LABS, IMAGING, TESTING) - I reviewed patient records, labs, notes, testing and imaging myself where available.  No flowsheet data found.   Lab Results  Component Value Date   WBC 8.3 08/28/2019   HGB 13.3 08/28/2019   HCT 40.0 08/28/2019   MCV 96.4 08/28/2019   PLT 425 (H) 08/28/2019      Component Value Date/Time   NA 140 08/28/2019 1846   K 3.7 08/28/2019 1846   CL 103 08/28/2019 1846   CO2 27 08/28/2019 1846   GLUCOSE 125 (H) 08/28/2019 1846   BUN 13 08/28/2019 1846   CREATININE 0.69 08/28/2019 1846   CALCIUM 9.8 08/28/2019 1846   PROT 7.6 06/04/2019 0950   PROT 7.1 07/10/2017 1058   ALBUMIN 4.8 06/04/2019 0950   AST 12 06/04/2019 0950   ALT 10 06/04/2019 0950   ALKPHOS 128 (H) 06/04/2019 0950   BILITOT 0.4 06/04/2019 0950   GFRNONAA >60 08/28/2019 1846   GFRAA >60 08/28/2019 1846   Lab Results  Component Value Date   CHOL 224 (H) 06/04/2019   HDL 66.90 06/04/2019   LDLCALC 138 (H) 06/04/2019   TRIG 96.0 06/04/2019   CHOLHDL 3 06/04/2019   Lab Results  Component Value Date   HGBA1C 5.4 07/10/2017   Lab Results  Component Value Date   VITAMINB12 716 06/04/2019   Lab Results  Component Value Date   TSH 0.64 06/04/2019    ASSESSMENT AND PLAN 68 y.o. year old female  has a past medical history of Anxiety (04/06/2017),  Cystitis, Depression, Dry eyes (04/06/2017), Elevated BP without diagnosis of hypertension (04/06/2017), Fibromyalgia, Heart murmur, Hiatal hernia, History of colon polyps, History of echocardiogram, History of Lyme disease, IBS (irritable bowel syndrome), Mixed hyperlipidemia (04/06/2017), TMJ (dislocation of temporomandibular joint), and Vasomotor rhinitis. here with     ICD-10-CM   1. OSA on CPAP  G47.33 For home use only DME continuous positive airway pressure (CPAP)   Z99.89     Mr Kohlman is doing fairly well with CPAP therapy.  Compliance report reveals excellent compliance.  She continues to note concerns of pressure being too high.  Residual AHI 0.6 with average pressure of 8.2, maximum of 8.8.  I have discussed these findings with the patient.  I am concerned that decreasing her maximum pressure will result in elevated apneic events.  She does have a history of IBS.  We have discussed increasing her EPR level to 2 and her using a chinstrap.  This may help with inhalation of air in the sensation of pressure being too high.  Dr. Rexene Alberts has  recommended bringing her in for a titration study.  She will call me in a couple of weeks if her concerns continue.  At that time we will advised that she return for titration study.  She will continue close follow-up with primary care and Dr. Sabra Heck as discussed.  If symptoms are resolved with pressure changes she will follow-up in 1 year, sooner if needed.  She verbalizes understanding and agreement with this plan.   Orders Placed This Encounter  Procedures  . For home use only DME continuous positive airway pressure (CPAP)    Please increase EPR level to 2.    Order Specific Question:   Length of Need    Answer:   Lifetime    Order Specific Question:   Patient has OSA or probable OSA    Answer:   Yes    Order Specific Question:   Is the patient currently using CPAP in the home    Answer:   Yes    Order Specific Question:   Settings    Answer:   Other see  comments    Order Specific Question:   CPAP supplies needed    Answer:   Mask, headgear, cushions, filters, heated tubing and water chamber     No orders of the defined types were placed in this encounter.     I spent 15 minutes with the patient. 50% of this time was spent counseling and educating patient on plan of care and medications.    Debbora Presto, FNP-C 12/11/2019, 11:28 AM [facility] [address], [place], [place] [address] [phone]  I reviewed the above note and documentation by the Nurse Practitioner and agree with the history, exam, assessment and plan as outlined above. I was available for consultation. Star Age, MD, PhD [facility])

## 2019-12-11 NOTE — Patient Instructions (Addendum)
Continue CPAP nightly and for greater than 4 hours each night   We will increase comfort setting, call if you continue to feel pressure is too high  Follow up in 1 year, sooner if needed   Sleep Apnea Sleep apnea affects breathing during sleep. It causes breathing to stop for a short time or to become shallow. It can also increase the risk of:  Heart attack.  Stroke.  Being very overweight (obese).  Diabetes.  Heart failure.  Irregular heartbeat. The goal of treatment is to help you breathe normally again. What are the causes? There are three kinds of sleep apnea:  Obstructive sleep apnea. This is caused by a blocked or collapsed airway.  Central sleep apnea. This happens when the brain does not send the right signals to the muscles that control breathing.  Mixed sleep apnea. This is a combination of obstructive and central sleep apnea. The most common cause of this condition is a collapsed or blocked airway. This can happen if:  Your throat muscles are too relaxed.  Your tongue and tonsils are too large.  You are overweight.  Your airway is too small. What increases the risk?  Being overweight.  Smoking.  Having a small airway.  Being older.  Being female.  Drinking alcohol.  Taking medicines to calm yourself (sedatives or tranquilizers).  Having family members with the condition. What are the signs or symptoms?  Trouble staying asleep.  Being sleepy or tired during the day.  Getting angry a lot.  Loud snoring.  Headaches in the morning.  Not being able to focus your mind (concentrate).  Forgetting things.  Less interest in sex.  Mood swings.  Personality changes.  Feelings of sadness (depression).  Waking up a lot during the night to pee (urinate).  Dry mouth.  Sore throat. How is this diagnosed?  Your medical history.  A physical exam.  A test that is done when you are sleeping (sleep study). The test is most often done in a  sleep lab but may also be done at home. How is this treated?   Sleeping on your side.  Using a medicine to get rid of mucus in your nose (decongestant).  Avoiding the use of alcohol, medicines to help you relax, or certain pain medicines (narcotics).  Losing weight, if needed.  Changing your diet.  Not smoking.  Using a machine to open your airway while you sleep, such as: ? An oral appliance. This is a mouthpiece that shifts your lower jaw forward. ? A CPAP device. This device blows air through a mask when you breathe out (exhale). ? An EPAP device. This has valves that you put in each nostril. ? A BPAP device. This device blows air through a mask when you breathe in (inhale) and breathe out.  Having surgery if other treatments do not work. It is important to get treatment for sleep apnea. Without treatment, it can lead to:  High blood pressure.  Coronary artery disease.  In men, not being able to have an erection (impotence).  Reduced thinking ability. Follow these instructions at home: Lifestyle  Make changes that your doctor recommends.  Eat a healthy diet.  Lose weight if needed.  Avoid alcohol, medicines to help you relax, and some pain medicines.  Do not use any products that contain nicotine or tobacco, such as cigarettes, e-cigarettes, and chewing tobacco. If you need help quitting, ask your doctor. General instructions  Take over-the-counter and prescription medicines only as told by  your doctor.  If you were given a machine to use while you sleep, use it only as told by your doctor.  If you are having surgery, make sure to tell your doctor you have sleep apnea. You may need to bring your device with you.  Keep all follow-up visits as told by your doctor. This is important. Contact a doctor if:  The machine that you were given to use during sleep bothers you or does not seem to be working.  You do not get better.  You get worse. Get help right  away if:  Your chest hurts.  You have trouble breathing in enough air.  You have an uncomfortable feeling in your back, arms, or stomach.  You have trouble talking.  One side of your body feels weak.  A part of your face is hanging down. These symptoms may be an emergency. Do not wait to see if the symptoms will go away. Get medical help right away. Call your local emergency services (911 in the [place] Do not drive yourself to the hospital. Summary  This condition affects breathing during sleep.  The most common cause is a collapsed or blocked airway.  The goal of treatment is to help you breathe normally while you sleep. This information is not intended to replace advice given to you by your health care provider. Make sure you discuss any questions you have with your health care provider. Document Released: 09/20/2008 Document Revised: 09/28/2018 Document Reviewed: 08/07/2018 Elsevier Patient Education  [employer] [place].   CPAP and BPAP Information CPAP and BPAP are methods of helping a person breathe with the use of air pressure. CPAP stands for "continuous positive airway pressure." BPAP stands for "bi-level positive airway pressure." In both methods, air is blown through your nose or mouth and into your air passages to help you breathe well. CPAP and BPAP use different amounts of pressure to blow air. With CPAP, the amount of pressure stays the same while you breathe in and out. With BPAP, the amount of pressure is increased when you breathe in (inhale) so that you can take larger breaths. Your health care provider will recommend whether CPAP or BPAP would be more helpful for you. Why are CPAP and BPAP treatments used? CPAP or BPAP can be helpful if you have:  Sleep apnea.  Chronic obstructive pulmonary disease (COPD).  Heart failure.  Medical conditions that weaken the muscles of the chest including muscular dystrophy, or neurological diseases such as amyotrophic lateral  sclerosis (ALS).  Other problems that cause breathing to be weak, abnormal, or difficult. CPAP is most commonly used for obstructive sleep apnea (OSA) to keep the airways from collapsing when the muscles relax during sleep. How is CPAP or BPAP administered? Both CPAP and BPAP are provided by a small machine with a flexible plastic tube that attaches to a plastic mask. You wear the mask. Air is blown through the mask into your nose or mouth. The amount of pressure that is used to blow the air can be adjusted on the machine. Your health care provider will determine the pressure setting that should be used based on your individual needs. When should CPAP or BPAP be used? In most cases, the mask only needs to be worn during sleep. Generally, the mask needs to be worn throughout the night and during any daytime naps. People with certain medical conditions may also need to wear the mask at other times when they are awake. Follow instructions from your  health care provider about when to use the machine. What are some tips for using the mask?   Because the mask needs to be snug, some people feel trapped or closed-in (claustrophobic) when first using the mask. If you feel this way, you may need to get used to the mask. One way to do this is by holding the mask loosely over your nose or mouth and then gradually applying the mask more snugly. You can also gradually increase the amount of time that you use the mask.  Masks are available in various types and sizes. Some fit over your mouth and nose while others fit over just your nose. If your mask does not fit well, talk with your health care provider about getting a different one.  If you are using a mask that fits over your nose and you tend to breathe through your mouth, a chin strap may be applied to help keep your mouth closed.  The CPAP and BPAP machines have alarms that may sound if the mask comes off or develops a leak.  If you have trouble with the  mask, it is very important that you talk with your health care provider about finding a way to make the mask easier to tolerate. Do not stop using the mask. Stopping the use of the mask could have a negative impact on your health. What are some tips for using the machine?  Place your CPAP or BPAP machine on a secure table or stand near an electrical outlet.  Know where the on/off switch is located on the machine.  Follow instructions from your health care provider about how to set the pressure on your machine and when you should use it.  Do not eat or drink while the CPAP or BPAP machine is on. Food or fluids could get pushed into your lungs by the pressure of the CPAP or BPAP.  Do not smoke. Tobacco smoke residue can damage the machine.  For home use, CPAP and BPAP machines can be rented or purchased through home health care companies. Many different brands of machines are available. Renting a machine before purchasing may help you find out which particular machine works well for you.  Keep the CPAP or BPAP machine and attachments clean. Ask your health care provider for specific instructions. Get help right away if:  You have redness or open areas around your nose or mouth where the mask fits.  You have trouble using the CPAP or BPAP machine.  You cannot tolerate wearing the CPAP or BPAP mask.  You have pain, discomfort, and bloating in your abdomen. Summary  CPAP and BPAP are methods of helping a person breathe with the use of air pressure.  Both CPAP and BPAP are provided by a small machine with a flexible plastic tube that attaches to a plastic mask.  If you have trouble with the mask, it is very important that you talk with your health care provider about finding a way to make the mask easier to tolerate. This information is not intended to replace advice given to you by your health care provider. Make sure you discuss any questions you have with your health care provider.  Document Released: 09/09/2004 Document Revised: 04/03/2019 Document Reviewed: 10/31/2016 Elsevier Patient Education  2020 [employer].

## 2019-12-11 NOTE — Progress Notes (Signed)
____________________________________________________________  Attending physician addendum:  Thank you for sending this case to me. I have reviewed the entire note, and the outlined plan seems appropriate.  Agree treat constipation, and then hemorrhoidal banding not likely to be necessary.  Wilfrid Lund, MD  ____________________________________________________________

## 2019-12-11 NOTE — Patient Instructions (Addendum)
If you are age 68 or older, your body mass index should be between 23-30. Your Body mass index is 25.97 kg/m. If this is out of the aforementioned range listed, please consider follow up with your Primary Care Provider.  If you are age 63 or younger, your body mass index should be between 19-25. Your Body mass index is 25.97 kg/m. If this is out of the aformentioned range listed, please consider follow up with your Primary Care Provider.   Continue using Miralax 17 grams in 8 ounces of water daily Preparation H suppositories use one per rectum twice daily for 5 days then use one at bedtime for 5 days.    Please contact our office in 3-4 weeks if you are not any better.

## 2019-12-12 ENCOUNTER — Encounter: Payer: Self-pay | Admitting: Physical Therapy

## 2019-12-12 ENCOUNTER — Ambulatory Visit: Payer: PPO | Admitting: Physical Therapy

## 2019-12-12 ENCOUNTER — Ambulatory Visit: Payer: PPO | Admitting: Physician Assistant

## 2019-12-12 DIAGNOSIS — R42 Dizziness and giddiness: Secondary | ICD-10-CM

## 2019-12-12 DIAGNOSIS — M542 Cervicalgia: Secondary | ICD-10-CM

## 2019-12-12 DIAGNOSIS — R2681 Unsteadiness on feet: Secondary | ICD-10-CM
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
## 2019-12-18 ENCOUNTER — Telehealth: Payer: Self-pay | Admitting: Family Medicine

## 2019-12-18 DIAGNOSIS — G4733 Obstructive sleep apnea (adult) (pediatric): Secondary | ICD-10-CM

## 2019-12-18 NOTE — Telephone Encounter (Signed)
Received CM that [facility] did receive order notification.

## 2019-12-18 NOTE — Telephone Encounter (Signed)
Pt called wanting to know if the provider can put back her cpap to the original pressure it was before the adjustment. Please advise.

## 2019-12-18 NOTE — Telephone Encounter (Signed)
Order placed to return to EPR of 1

## 2019-12-19 ENCOUNTER — Other Ambulatory Visit: Payer: Self-pay

## 2019-12-19 ENCOUNTER — Ambulatory Visit: Payer: PPO | Admitting: Physical Therapy

## 2019-12-19 ENCOUNTER — Encounter: Payer: Self-pay | Admitting: Physical Therapy

## 2019-12-19 DIAGNOSIS — M542 Cervicalgia: Secondary | ICD-10-CM

## 2019-12-19 DIAGNOSIS — R42 Dizziness and giddiness: Secondary | ICD-10-CM

## 2019-12-19 DIAGNOSIS — R2681 Unsteadiness on feet: Secondary | ICD-10-CM

## 2019-12-19 NOTE — Patient Instructions (Signed)
Jaw Exercises: Tongue Clucks:  Find normal resting position = holding one third of tongue gently against the roof of the mouth just behind the front teeth AND diaphragmatically breathe through nose while tongue is in resting position x 6 breaths.   Relaxation    Place tongue on roof of mouth. Slowly open mouth, tongue on the roof. Repeat __6__ times per set.  Do __1-2__ sessions per day.  Closing: Isometric Resisted    Place two fingers on chin and resist clenching teeth together. Hold __6__ seconds. Relax. Repeat __6__ times per set.  Do _1-2_ sessions per day.   Opening: Isometric    Place fist below jaw. Resist downward movement of chin. Hold __6__ seconds. Relax. Repeat __6__ times per set.  Do _1-2_ sessions per day.   Lateral Glide: Isometric    Place two fingers on left side of jaw. Resist movement of jaw to same side. Hold __6__ seconds. Relax. Repeat __6__ times per set  Repeat to the other side, 6 times with 6 second hold. Do _1-2___ sessions per day.   Nod: Cervical Flexion    Hands supporting the lower neck to stabilize.  Nod head, tipping chin down. Tighten muscles in the back of throat. Do _6__ times, _1-2__ times per day.   Axial Extension (Chin Tuck)    Pull chin in and lengthen back of neck. Hold __6__ seconds while counting out loud. Repeat __6__ times. Do __1-2__ sessions per day.   Shoulder Retraction and Depression    Tuck in chin and gently pull shoulders back and down. Hold __6__ seconds. Repeat __6__ times. Do _1-2___ sessions per day.   Marland Kitchen
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
## 2019-12-25 DIAGNOSIS — M5481 Occipital neuralgia: Secondary | ICD-10-CM | POA: Diagnosis not present

## 2019-12-25 DIAGNOSIS — R42 Dizziness and giddiness: Secondary | ICD-10-CM | POA: Diagnosis not present

## 2019-12-25 DIAGNOSIS — M797 Fibromyalgia: Secondary | ICD-10-CM | POA: Diagnosis not present

## 2019-12-25 DIAGNOSIS — G4733 Obstructive sleep apnea (adult) (pediatric): Secondary | ICD-10-CM | POA: Diagnosis not present

## 2019-12-25 DIAGNOSIS — M5030 Other cervical disc degeneration, unspecified cervical region: Secondary | ICD-10-CM | POA: Diagnosis not present

## 2019-12-30 ENCOUNTER — Telehealth: Payer: Self-pay | Admitting: Physician Assistant

## 2019-12-30 NOTE — Telephone Encounter (Signed)
Natalie Erickson  The patient states "I have an allergy to hydrocortisone. I can use it externally, but not internally." Also applying externally OTC Prep H. Asked if it has hydrocortisone. States it does. She is using a lidocaine gel that she has. Asks if hemorrhoids can cause pelvic pain. Asks if she could have a pelvic floor dysfunction. States she has a GYN. Asks if she could have a UTI and states she has something for that. Sorry for the back and forth.

## 2019-12-30 NOTE — Telephone Encounter (Signed)
  Patient reports she is having pain with and after her bowel movements. Soft to soft formed bowel movements. Taking daily Miralax. She reports it is creating pelvic pain.  Please advise.

## 2019-12-30 NOTE — Telephone Encounter (Signed)
Lets send rx for Anusol HC supp - use BID x 7 days , then qhs x 7 more if still having sxs. Also ask her to get OTC Recticare /Lidocaine and insert 1 inch into  anus 3-4 x daily (use after Bm ) until sxs resolve

## 2019-12-31 NOTE — Telephone Encounter (Signed)
Internal hemorrhoids usually are not that painful- would ask her to get plain recticare OTC which is just Lidocaine  and apply 1 inch into anus 3-4 x daily.  She has interstitial cystitis , and yes could have a UTI  Lets check UA , and pelvic US   Pelvic floor dysfunction is not cause of pain

## 2019-12-31 NOTE — Telephone Encounter (Signed)
Amy, any suggestions? She feels strongly that she cannot use hydrocortisone internally.

## 2019-12-31 NOTE — Telephone Encounter (Signed)
Called patient to share this recommendation with her. She states she believes the issues are related to her IBS-C. She would like to proactively get PT for pelvic floor training.  Reviewed with her the symptoms she was giving me over the past conversations. Summarizes as follows; rectal pain with defecation and after. She is now seeing "spots" of blood on the witch hazel pads she uses to clean with. She is d/cing the hemorrhoid cream. "Feels very irritated down there." She denies constipation, stating she has a couple of soft bowel movements daily and she is on Miralax. She agrees to the UA but feels this will be negative.  Again requests referral to PT for pelvic floor training to help with her IBS-C Thanks!

## 2019-12-31 NOTE — Telephone Encounter (Signed)
Pt is calling back and said she is still having a lot of pain with the hemorrhoids and wants to know what to do

## 2020-01-01 ENCOUNTER — Other Ambulatory Visit: Payer: Self-pay

## 2020-01-01 ENCOUNTER — Other Ambulatory Visit: Payer: PPO

## 2020-01-01 DIAGNOSIS — R1030 Lower abdominal pain, unspecified: Secondary | ICD-10-CM

## 2020-01-01 DIAGNOSIS — Z8719 Personal history of other diseases of the digestive system: Secondary | ICD-10-CM

## 2020-01-01 DIAGNOSIS — K5902 Outlet dysfunction constipation: Secondary | ICD-10-CM

## 2020-01-01 DIAGNOSIS — N3010 Interstitial cystitis (chronic) without hematuria: Secondary | ICD-10-CM

## 2020-01-01 NOTE — Telephone Encounter (Signed)
It is fine to refer her to PT for pelvic floor training- not saying she doesn't have this -just that it doesn't cause severe pain- she may have a fissure  however I could not appreciate on exam

## 2020-01-01 NOTE — Telephone Encounter (Signed)
Referral placed to [facility] [place]. Patient requests this location. Message to patient.

## 2020-01-03 LAB — URINALYSIS W MICROSCOPIC + REFLEX CULTURE
Bacteria, UA: NONE SEEN /HPF
Bilirubin Urine: NEGATIVE
Glucose, UA: NEGATIVE
Hyaline Cast: NONE SEEN /LPF
Nitrites, Initial: NEGATIVE
Protein, ur: NEGATIVE
Specific Gravity, Urine: 1.02 (ref 1.001–1.03)
pH: <=5 (ref 5.0–8.0)

## 2020-01-03 LAB — URINE CULTURE
MICRO NUMBER:: 10017101
SPECIMEN QUALITY:: ADEQUATE

## 2020-01-03 LAB — CULTURE INDICATED

## 2020-01-06 NOTE — Progress Notes (Signed)
Please let patient know the urine culture did grow E. coli which is pansensitive.  She has multiple allergies, so please ask her if she can take Bactrim/Septra, or Cipro-and we can call in a prescription.

## 2020-01-07 ENCOUNTER — Other Ambulatory Visit: Payer: Self-pay

## 2020-01-07 DIAGNOSIS — N952 Postmenopausal atrophic vaginitis: Secondary | ICD-10-CM | POA: Insufficient documentation

## 2020-01-07 DIAGNOSIS — Z01419 Encounter for gynecological examination (general) (routine) without abnormal findings: Secondary | ICD-10-CM | POA: Diagnosis not present

## 2020-01-07 MED ORDER — TRIMETHOPRIM 100 MG PO TABS: 100.0000 mg | ORAL_TABLET | Freq: Two times a day (BID) | ORAL | 0 refills | Status: DC

## 2020-01-08 DIAGNOSIS — R42 Dizziness and giddiness: Secondary | ICD-10-CM | POA: Insufficient documentation

## 2020-01-08 DIAGNOSIS — M5030 Other cervical disc degeneration, unspecified cervical region: Secondary | ICD-10-CM | POA: Insufficient documentation

## 2020-01-08 DIAGNOSIS — M5481 Occipital neuralgia: Secondary | ICD-10-CM | POA: Diagnosis not present

## 2020-01-08 DIAGNOSIS — M797 Fibromyalgia: Secondary | ICD-10-CM | POA: Diagnosis not present

## 2020-01-09 ENCOUNTER — Other Ambulatory Visit: Payer: Self-pay

## 2020-01-09 ENCOUNTER — Ambulatory Visit: Payer: PPO | Attending: Physician Assistant | Admitting: Physical Therapy

## 2020-01-09 ENCOUNTER — Encounter: Payer: Self-pay | Admitting: Physical Therapy

## 2020-01-09 DIAGNOSIS — K5900 Constipation, unspecified: Secondary | ICD-10-CM | POA: Diagnosis not present

## 2020-01-09 DIAGNOSIS — M25652 Stiffness of left hip, not elsewhere classified: Secondary | ICD-10-CM

## 2020-01-09 DIAGNOSIS — M25552 Pain in left hip: Secondary | ICD-10-CM | POA: Diagnosis not present

## 2020-01-09 DIAGNOSIS — R278 Other lack of coordination: Secondary | ICD-10-CM | POA: Diagnosis not present

## 2020-01-09 DIAGNOSIS — M6281 Muscle weakness (generalized): Secondary | ICD-10-CM | POA: Diagnosis not present
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
## 2020-01-10 ENCOUNTER — Other Ambulatory Visit: Payer: Self-pay

## 2020-01-10 ENCOUNTER — Encounter: Payer: Self-pay | Admitting: Physical Therapy

## 2020-01-10 ENCOUNTER — Ambulatory Visit: Payer: PPO | Attending: Nurse Practitioner | Admitting: Physical Therapy

## 2020-01-10 ENCOUNTER — Ambulatory Visit: Payer: PPO | Admitting: Gastroenterology

## 2020-01-10 DIAGNOSIS — R42 Dizziness and giddiness: Secondary | ICD-10-CM | POA: Insufficient documentation

## 2020-01-10 DIAGNOSIS — M542 Cervicalgia: Secondary | ICD-10-CM | POA: Insufficient documentation

## 2020-01-10 DIAGNOSIS — R2681 Unsteadiness on feet: Secondary | ICD-10-CM | POA: Insufficient documentation
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
## 2020-01-10 NOTE — Patient Instructions (Signed)
Jaw Exercises: Tongue Clucks:  Find normal resting position = holding one third of tongue gently against the roof of the mouth just behind the front teeth AND diaphragmatically breathe through nose while tongue is in resting position x 6 breaths.   Relaxation    Place tongue on roof of mouth. Slowly open mouth, tongue on the roof. Repeat __6__ times per set.  Do __1-2__ sessions per day.  Closing: Isometric Resisted    Place two fingers on chin and resist clenching teeth together. Hold __6__ seconds. Relax. Repeat __6__ times per set.  Do _1-2_ sessions per day.   Opening: Isometric    Place fist below jaw. Resist downward movement of chin. Hold __6__ seconds. Relax. Repeat __6__ times per set.  Do _1-2_ sessions per day.   Lateral Glide: Isometric    Place two fingers on left side of jaw. Resist movement of jaw to same side. Hold __6__ seconds. Relax. Repeat __6__ times per set  Repeat to the other side, 6 times with 6 second hold. Do _1-2___ sessions per day.   Nod: Cervical Flexion    Hands supporting the lower neck to stabilize.  Nod head, tipping chin down. Tighten muscles in the back of throat. Do _6__ times, _1-2__ times per day.   Axial Extension (Chin Tuck)    Pull chin in and lengthen back of neck. Hold __6__ seconds while counting out loud. Repeat __6__ times. Do __1-2__ sessions per day.   Shoulder Retraction and Depression    Tuck in chin and gently pull shoulders back and down. Hold __6__ seconds. Repeat __6__ times. Do _1-2___ sessions per day.  Gaze Stabilization: Standing Feet Apart (Compliant Surface)    Place letter on a busy background (wrapping paper, wall paper, window, etc).  Feet together on pillow, keeping eyes on target on wall __3__ feet away, tilt head down 15-30 and move head side to side for _60___ seconds. Repeat while moving head up and down for _60_ seconds. Do _2-3_ sessions per day.    Access Code: [email or web address]  URL:  [email or web address]  Date: 12/05/2019  Prepared by: Misty Stanley   Exercises Standing Shoulder External Rotation Stretch in Doorway - 3 sets - 30 second hold - 1x daily - 7x weekly Seated Levator Scapulae Stretch - 2 sets - 30 second hold - 1x daily - 7x weekly Seated Cervical Sidebending Stretch - 2 sets - 30 second hold - 1x daily - 7x weekly Seated Shoulder Flexion Full Range - 8-10 reps - 1 sets - 1x daily - 7x weekly Seated Shoulder Horizontal Abduction with Resistance - 8-10 reps - 1 sets - 1x daily - 7x weekly Seated Shoulder Diagonal with Resistance - 8-10 reps - 2 sets - 1x daily - 7x weekly Seated Single Arm Shoulder External Rotation with Self-Anchored Resistance - 10 reps - 2 sets - 1x daily - 7x weekly               .

## 2020-01-13 ENCOUNTER — Encounter: Payer: Self-pay | Admitting: Physical Therapy

## 2020-01-13 ENCOUNTER — Ambulatory Visit: Payer: PPO | Admitting: Physical Therapy

## 2020-01-13 ENCOUNTER — Other Ambulatory Visit: Payer: Self-pay

## 2020-01-13 DIAGNOSIS — M25552 Pain in left hip: Secondary | ICD-10-CM

## 2020-01-13 DIAGNOSIS — R278 Other lack of coordination: Secondary | ICD-10-CM

## 2020-01-13 DIAGNOSIS — M6281 Muscle weakness (generalized): Secondary | ICD-10-CM | POA: Diagnosis not present

## 2020-01-13 DIAGNOSIS — M25652 Stiffness of left hip, not elsewhere classified: Secondary | ICD-10-CM

## 2020-01-13 NOTE — Patient Instructions (Signed)
Access Code: [email or web address]  URL: [email or web address]  Date: 01/13/2020  Prepared by: Earlie Counts   Exercises Hooklying Hamstring Stretch with Strap - 2 reps - 1 sets - 30 sec hold - 1x daily - 7x weekly Supine Pelvic Floor Stretch - 1 reps - 1 sets - 30 sec hold - 1x daily - 7x weekly Hip Adductors and Hamstring Stretch with Strap - 2 reps - 1 sets - 30 sec hold - 1x daily - 7x weekly Supine Diaphragmatic Breathing - 5 reps - 1 sets - 12x daily - 7x weekly [facility] [address], [place], [place] [address] Phone # [phone] Fax [phone]
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
## 2020-01-22 ENCOUNTER — Ambulatory Visit: Payer: PPO | Admitting: Nurse Practitioner

## 2020-01-22 ENCOUNTER — Other Ambulatory Visit: Payer: Self-pay

## 2020-01-22 ENCOUNTER — Encounter: Payer: Self-pay | Admitting: Nurse Practitioner

## 2020-01-22 VITALS — BP 116/76 | HR 80 | Temp 98.7°F | Ht 61.5 in | Wt 144.0 lb

## 2020-01-22 DIAGNOSIS — K649 Unspecified hemorrhoids: Secondary | ICD-10-CM | POA: Diagnosis not present

## 2020-01-22 DIAGNOSIS — K648 Other hemorrhoids: Secondary | ICD-10-CM

## 2020-01-22 DIAGNOSIS — Z86010 Personal history of colonic polyps: Secondary | ICD-10-CM | POA: Diagnosis not present

## 2020-01-22 DIAGNOSIS — K644 Residual hemorrhoidal skin tags: Secondary | ICD-10-CM

## 2020-01-22 NOTE — Progress Notes (Signed)
01/22/2020 Natalie Erickson [id number] [birth year]   Chief Complaint: Hemorrhoids   History of Present Illness:  Natalie Erickson is a 69 year old female with a past medical history of anxiety, interstitial cystitis, fibromyalgia, occipital neuralgia and colon polyps. She is followed by Dr. Loletha Carrow and Amy Egegik PA-C. She was last seen in our office by Nicoletta Ba on 12/11/2019 with rectal bleeding and pelvic discomfort. She was assessed to have hemorrhoids and she was prescribed Miralax and Preparation H suppositories. She reports being allergic to steroids so she has not used Hydrocortisone suppositories. She intermittently used a sitz bath. Her hemorrhoidal discomfort and bleeding improved. She presents today for further follow up. She last saw bright red blood on the toile tissue a few days ago. She reports seeing bright red blood on the toilet tissue twice monthly. No significant pain. Her hemorrhoidal irritation is triggered by eating spicy foods. She reported having a colonoscopy done in [place] in 2016 and a few polyps were removed. I will request a copy of her colonoscopy for further review. She has constipation at times, had a difficult time pushing the stool out. She is currently undergoing pelvic floor physical therapy. She takes Miralax twice daily and a fiber supplement. No other complaints today.   CBC Latest Ref Rng & Units 08/28/2019 06/04/2019 11/13/2018  WBC 4.0 - 10.5 K/uL 8.3 10.9(H) 9.1  Hemoglobin 12.0 - 15.0 g/dL 13.3 13.7 13.7  Hematocrit 36.0 - 46.0 % 40.0 40.8 39.9  Platelets 150 - 400 K/uL 425(H) 424.0(H) 376.0    Current Outpatient Medications on File Prior to Visit  Medication Sig Dispense Refill  . acetaminophen (TYLENOL) 500 MG tablet Take 1,000 mg by mouth every 6 (six) hours as needed for headache (pain).    . AMBULATORY NON FORMULARY MEDICATION Take 1 tablet by mouth as needed. Medication Name: [name]    . Cholecalciferol (VITAMIN D) 2000 units  tablet Take 2,000 Units by mouth daily.     Marland Kitchen gabapentin (NEURONTIN) 100 MG capsule 100 mg at bedtime as needed.     Marland Kitchen MAGNESIUM GLUCONATE PO Take 200 mg by mouth as directed.     [name] (SYSTANE ULTRA OP) Apply to eye 4 (four) times daily.    . polyethylene glycol powder (GLYCOLAX/MIRALAX) 17 GM/SCOOP powder Take 17-34 g by mouth daily.    . pramoxine-hydrocortisone (PROCTOCREAM-HC) 1-1 % rectal cream Place 1 application rectally 2 (two) times daily. 30 g 1  . pyridOXINE (B-6) 50 MG tablet Take 50 mg by mouth as needed.     . docusate sodium (COLACE) 50 MG capsule Take 50 mg by mouth as needed for mild constipation.     No current facility-administered medications on file prior to visit.   Allergies  Allergen Reactions  . Diltiazem Shortness Of Breath  . Metoprolol Shortness Of Breath  . Statins Tinitus    Achy joints, muscle aches Achy joints, muscle aches  . Chocolate Other (See Comments)    migraine  . Cocoa Other (See Comments)    migraine migraine  . Codeine Nausea And Vomiting  . Cranberry Other (See Comments)    Cystitis  . Latex Rash  . Monosodium Glutamate Other (See Comments)    Migraine  . Penicillins Rash    Has patient had a PCN reaction causing immediate rash, facial/tongue/throat swelling, SOB or lightheadedness with hypotension: Yes Has patient had a PCN reaction causing severe rash involving mucus membranes or skin necrosis: No Has  patient had a PCN reaction that required hospitalization No Has patient had a PCN reaction occurring within the last 10 years: No If all of the above answers are "NO", then may proceed with Cephalosporin use.   Marland Kitchen Antihistamines, Loratadine-Type     Throat swelling  . Carvedilol Other (See Comments)    N&N, SOB , Head ache and joint pain.   . Eggs Or Egg-Derived Products   . Ginger   . Gluten Meal   . Lexapro [Escitalopram]     GI   . Montelukast Other (See Comments)    Throat swelling  . Other  Swelling    Throat swelling  . Prednisone Swelling    Throat swelling (no difficulty breathing)  . Sulfites Other (See Comments)    Nausea and headaches  . Vanilla   . Whey   . Zofran [Ondansetron Hcl] Other (See Comments)    Muscle cramps, leg tingling  . Azithromycin Other (See Comments) and Diarrhea    Current Medications, Allergies, Past Medical History, Past Surgical History, Family History and Social History were reviewed in [employer] record.   Physical Exam: BP 116/76 (BP Location: Left Arm, Patient Position: Sitting, Cuff Size: Normal)   Pulse 80   Temp 98.7 F (37.1 C)   Ht 5' 1.5" (1.562 m)   Wt 144 lb (65.3 kg)   BMI 26.77 kg/m  General: Well developed  69 year old female in no acute distress Head: Normocephalic and atraumatic Eyes:  Sclerae anicteric, conjunctiva pink  Ears: Normal auditory acuity Lungs: Clear throughout to auscultation Heart: Regular rate and rhythm Abdomen: Soft, non tender and non distended. No masses, no hepatomegaly. Normal bowel sounds x 4 quadrants.  Rectal: External hemorrhoids without inflammation or active bleeding, internal hemorrhoids R > L with inflammation at the anal verge without significant prolapse. No stool in the rectum.  Musculoskeletal: Symmetrical with no gross deformities  Extremities: No edema  Neurological: Alert oriented x 4, grossly nonfocal Psychological:  Alert and cooperative. Normal mood and affect  Assessment and Recommendations:  14. 69 year old female with rectal bleeding, internal and external hemorrhoids. We discussed internal hemorrhoid banding, however, she is allergic to latex. -Refer to colorectal surgeon for hemorrhoid tx options -Schedule a colonoscopy spring or early summer 2021, benefits and risks discussed including risks with sedation, risk of bleeding, perforation and infection  -Desitin for hemorrhoid care PRN -Continue Miralax  -Continue Pelvic floor physical therapy    2. History of colon polyps. -request copy of 2016 colonoscopy  -see plan # 1

## 2020-01-22 NOTE — Patient Instructions (Addendum)
If you are age 69 or older, your body mass index should be between 23-30. Your Body mass index is 26.77 kg/m. If this is out of the aforementioned range listed, please consider follow up with your Primary Care Provider.  If you are age 39 or younger, your body mass index should be between 19-25. Your Body mass index is 26.77 kg/m. If this is out of the aformentioned range listed, please consider follow up with your Primary Care Provider.   We are going to refer you to [facility], they will contact you to schedule an appointment with a Colorectal Surgeon to discuss the removal of your hemorrhoids . If you do not hear from them within 1 week please contact our office. We are unable to band the hemorrhoids in our office due to your Latex allergy.  Due to recent changes in healthcare laws, you may see the results of your imaging and laboratory studies on MyChart before your provider has had a chance to review them.  We understand that in some cases there may be results that are confusing or concerning to you. Not all laboratory results come back in the same time frame and the provider may be waiting for multiple results in order to interpret others.  Please give [place] 48 hours in order for your provider to thoroughly review all the results before contacting the office for clarification of your results.   Thank you for choosing [facility] Noralyn Pick, CRNP

## 2020-01-23 ENCOUNTER — Other Ambulatory Visit: Payer: Self-pay

## 2020-01-23 ENCOUNTER — Encounter: Payer: Self-pay | Admitting: Physical Therapy

## 2020-01-23 ENCOUNTER — Telehealth: Payer: Self-pay | Admitting: General Surgery

## 2020-01-23 ENCOUNTER — Ambulatory Visit: Payer: PPO | Admitting: Physical Therapy

## 2020-01-23 DIAGNOSIS — M6281 Muscle weakness (generalized): Secondary | ICD-10-CM

## 2020-01-23 DIAGNOSIS — M25552 Pain in left hip: Secondary | ICD-10-CM

## 2020-01-23 DIAGNOSIS — R278 Other lack of coordination: Secondary | ICD-10-CM

## 2020-01-23 DIAGNOSIS — M25652 Stiffness of left hip, not elsewhere classified: Secondary | ICD-10-CM

## 2020-01-23 NOTE — Telephone Encounter (Signed)
-----   Message from Letta Pate, [place] sent at 01/22/2020 11:03 AM EST ----- Regarding: [facility] referral Internal and external hemorrhoids-latex allergy
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
## 2020-01-23 NOTE — Telephone Encounter (Signed)
Referral sent to [facility] for patient to see a colorectal surgeon regarding internal/external hemorrhoids

## 2020-01-23 NOTE — Patient Instructions (Addendum)
Toileting Techniques for Bowel Movements (Defecation) Using your belly (abdomen) and pelvic floor muscles to have a bowel movement is usually instinctive.  Sometimes people can have problems with these muscles and have to relearn proper defecation (emptying) techniques.  If you have weakness in your muscles, organs that are falling out, decreased sensation in your pelvis, or ignore your urge to go, you may find yourself straining to have a bowel movement.  You are straining if you are: . holding your breath or taking in a huge gulp of air and holding it  . keeping your lips and jaw tensed and closed tightly . turning red in the face because of excessive pushing or forcing . developing or worsening your  hemorrhoids . getting faint while pushing . not emptying completely and have to defecate many times a day  If you are straining, you are actually making it harder for yourself to have a bowel movement.  Many people find they are pulling up with the pelvic floor muscles and closing off instead of opening the anus. Due to lack pelvic floor relaxation and coordination the abdominal muscles, one has to work harder to push the feces out.  Many people have never been taught how to defecate efficiently and effectively.  Notice what happens to your body when you are having a bowel movement.  While you are sitting on the toilet pay attention to the following areas: . Jaw and mouth position . Angle of your hips   . Whether your feet touch the ground or not . Arm placement  . Spine position . Waist . Belly tension . Anus (opening of the anal canal)  An Evacuation/Defecation Plan   Here are the 4 basic points:  1. Lean forward enough for your elbows to rest on your knees 2. Support your feet on the floor or use a low stool if your feet don't touch the floor  3. Push out your belly as if you have swallowed a beach ball--you should feel a widening of your waist 4. Open and relax your pelvic floor  muscles, rather than tightening around the anus       The following conditions my require modifications to your toileting posture:  . If you have had surgery in the past that limits your back, hip, pelvic, knee or ankle flexibility . Constipation   Your healthcare practitioner may make the following additional suggestions and adjustments:  1) Sit on the toilet  a) Make sure your feet are supported. b) Notice your hip angle and spine position--most people find it effective to lean forward or raise their knees, which can help the muscles around the anus to relax  c) When you lean forward, place your forearms on your thighs for support  2) Relax suggestions a) Breath deeply in through your nose and out slowly through your mouth as if you are smelling the flowers and blowing out the candles. b) To become aware of how to relax your muscles, contracting and releasing muscles can be helpful.  Pull your pelvic floor muscles in tightly by using the image of holding back gas, or closing around the anus (visualize making a circle smaller) and lifting the anus up and in.  Then release the muscles and your anus should drop down and feel open. Repeat 5 times ending with the feeling of relaxation. c) Keep your pelvic floor muscles relaxed; let your belly bulge out. d) The digestive tract starts at the mouth and ends at the anal opening, so  be sure to relax both ends of the tube.  Place your tongue on the roof of your mouth with your teeth separated.  This helps relax your mouth and will help to relax the anus at the same time.  3) Empty (defecation) a) Keep your pelvic floor and sphincter relaxed, then bulge your anal muscles.  Make the anal opening wide.  b) Stick your belly out as if you have swallowed a beach ball. c) Make your belly wall hard using your belly muscles while continuing to breathe. Doing this makes it easier to open your anus. d) Breath out and give a grunt (or try using other sounds  such as ahhhh, shhhhh, ohhhh or grrrrrrr).  4) Finish a) As you finish your bowel movement, pull the pelvic floor muscles up and in.  This will leave your anus in the proper place rather than remaining pushed out and down. If you leave your anus pushed out and down, it will start to feel as though that is normal and give you incorrect signals about needing to have a bowel movement.  Moisturizers . They are used in the vagina to hydrate the mucous membrane that make up the vaginal canal. . Designed to keep a more normal acid balance (ph) . Once placed in the vagina, it will last between two to three days.  . Use 2-3 times per week at bedtime  . Ingredients to avoid is glycerin and fragrance, can increase chance of infection . Should not be used just before sex due to causing irritation . Most are gels administered either in a tampon-shaped applicator or as a vaginal suppository. They are non-hormonal.   Types of Moisturizers  . Vitamin E vaginal suppositories- Whole foods, Amazon . Moist Again . Coconut oil- can break down condoms . Julva- (Do no use if on Tamoxifen) amazon . Yes moisturizer- amazon . NeuEve Silk , NeuEve Silver for menopausal or over 65 (if have severe vaginal atrophy or cancer treatments use NeuEve Silk for  1 month than move to [employer]eps[employer])- [employer], MapleFlower.dk . Olive and Bee intimate cream- www.oliveandbee.com.au . Mae vaginal [place] . Aloe .    Creams to use externally on the Vulva area  Albertson's (good for for cancer patients that had radiation to the area)- [place] or [employer].FlyingBasics.com.br  V-magic cream - amazon  Julva-amazon  Vital "V Wild Yam salve ( help moisturize and help with thinning vulvar area, does have [place] by Irwin Brakeman labial moisturizer ([place],   Coconut or olive oil  aloe   Things to avoid in the vaginal area . Do not use  things to irritate the vulvar area . No lotions just specialized creams for the vulva area- Neogyn, V-magic, No soaps; can use Aveeno or Calendula cleanser if needed. Must be gentle . No deodorants . No douches . Good to sleep without underwear to let the vaginal area to air out . No scrubbing: spread the lips to let warm water rinse over labias and pat dry  Access Code: [email or web address]  URL: [email or web address]  Date: 01/23/2020  Prepared by: Earlie Counts   Exercises Hooklying Hamstring Stretch with Strap - 2 reps - 1 sets - 30 sec hold - 1x daily - 7x weekly Supine Pelvic Floor Stretch - 1 reps - 1 sets - 30 sec hold - 1x daily - 7x weekly Hip Adductors and Hamstring Stretch with Strap - 2 reps -  1 sets - 30 sec hold - 1x daily - 7x weekly Supine Diaphragmatic Breathing - 5 reps - 1 sets - 12x daily - 7x weekly Hooklying Transversus Abdominis Palpation - 10 reps - 1 sets - 5 sec hold - 1x daily - 7x weekly [facility] [address], [place], [place] [address] Phone # [phone] Fax [phone]

## 2020-01-25 DIAGNOSIS — G4733 Obstructive sleep apnea (adult) (pediatric): Secondary | ICD-10-CM | POA: Diagnosis not present

## 2020-01-27 ENCOUNTER — Telehealth: Payer: Self-pay | Admitting: Nurse Practitioner

## 2020-01-27 NOTE — Telephone Encounter (Signed)
Bre, please call the patient and let her know that Dr. Loletha Carrow would like her to schedule a colonoscopy with him now and not in the spring or early summer as she preferred when I saw he in office. Pls cancel her consult with the colorectal surgeon.  Dr. Loletha Carrow will consider banding her hemorrhoids (with non latex bands) some time after she completes her colonoscopy.

## 2020-01-27 NOTE — Telephone Encounter (Signed)
Please see additional documentation concerning

## 2020-01-27 NOTE — Telephone Encounter (Signed)
Pt states that [facility] has not called her yet and she feels very uncomfortable. She wants to know if we can do anything to help her get an appt.

## 2020-01-27 NOTE — Telephone Encounter (Signed)
Called and spoke with patient-patient reports she is having hip and groin pain-"the colonoscopy is not an emergency and I would prefer to see the surgeon first then have the colonoscopy since I am not due for that right now"; patient does not wish for the consult with [facility] to be cancelled;   Patient reports she knows that her issue is the hemorrhoids and that is why she does not think she can handle the colonoscopy prep; patient reports she has PT for pelvic floor therapy on Thursday;  Patient has adamantly declined being set up for the colonoscopy; patient advised to call back to the office as soon as she is ready to be scheduled for her colonoscopy (after seeing the sx); Patient advised to call back to the office at [phone] should questions/concerns arise;  Patient verbalized understanding of information/instructions;

## 2020-01-27 NOTE — Progress Notes (Signed)
____________________________________________________________  Attending physician addendum:  Thank you for sending this case to me. I have reviewed the entire note.  Change of plans please:  hold on colo-rectal surgery evaluation for now.  Instead, schedule her for a colonoscopy with me to evaluate for any additional sources of bleeding, especially with history of colon polyps.  Then, if int HR requiring banding, I can most likely attend to that subsequently in the office (latex free bands are available).   Wilfrid Lund, MD  ____________________________________________________________

## 2020-01-27 NOTE — Telephone Encounter (Signed)
See other phone note to Dr. Loletha Carrow today

## 2020-01-27 NOTE — Telephone Encounter (Signed)
Dr. Loletha Carrow, per our discussion today Natalie Erickson was contacted with instructions to cancel the colorectal surgeon consult regarding hemorrhoids and colonoscopy to be scheduled with you soon.  Pls see earlier phone note today from patient, she does not wish to schedule a colonoscopy at this time. Do you want to see her in office to consider hemorrhoid banding before a colonoscopy?

## 2020-01-28 NOTE — Telephone Encounter (Signed)
Called and spoke with patient-patient wanted to know if there is latex free bands for the hem banding?  Patient declined to make the appt with Dr. Loletha Carrow as she was told there were not latex free bands for the banding;  Patient also reports that "every time they do an assessment they make things hurt and I just don't want to do that right now because I am finally not hurting so much";   Please advise on next step

## 2020-01-28 NOTE — Telephone Encounter (Signed)
The best thing would be for me to talk with and examine her. I can work her in [place], Feb 3rd afternoon, since I have no other open clinic slots for weeks. Arrive 345 for 4pm visit

## 2020-01-29 ENCOUNTER — Telehealth: Payer: Self-pay | Admitting: Nurse Practitioner

## 2020-01-29 NOTE — Telephone Encounter (Signed)
I understand, and would like to help if I can.  In order to help, I must examine her to assess for myself the cause of symptoms.    If, after doing so, banding appears to be the next step, then we actually do have latex-free bands available.  The NP who saw her in clinic was just not aware of that, so apologies for the misunderstanding.  Please give her that information and let her decide how she wants to proceed.

## 2020-01-29 NOTE — Telephone Encounter (Signed)
Pt would like to speak with you. She wants to know if you were able to find the information for her. She stated that you would know what she is referring to.

## 2020-01-30 ENCOUNTER — Encounter: Payer: Self-pay | Admitting: Physical Therapy

## 2020-01-30 ENCOUNTER — Ambulatory Visit: Payer: PPO | Attending: Physician Assistant | Admitting: Physical Therapy

## 2020-01-30 ENCOUNTER — Other Ambulatory Visit: Payer: Self-pay

## 2020-01-30 DIAGNOSIS — R278 Other lack of coordination: Secondary | ICD-10-CM

## 2020-01-30 DIAGNOSIS — M6281 Muscle weakness (generalized): Secondary | ICD-10-CM | POA: Diagnosis not present

## 2020-01-30 DIAGNOSIS — G4733 Obstructive sleep apnea (adult) (pediatric): Secondary | ICD-10-CM | POA: Diagnosis not present

## 2020-01-30 DIAGNOSIS — M25652 Stiffness of left hip, not elsewhere classified: Secondary | ICD-10-CM | POA: Diagnosis not present

## 2020-01-30 DIAGNOSIS — M25552 Pain in left hip: Secondary | ICD-10-CM

## 2020-01-30 NOTE — Telephone Encounter (Signed)
Called and spoke with patient-patient given MD recommendations and is agreeable to plan of care; patient has been scheduled for an OV on 02/14/2020 at 4:00 pm; patient reports she does not want an appt with [facility] unless it is absolutely necessary; patient has not been scheduled for an appt with [facility] at this time; Patient advised to call back to the office at [phone] should questions/concerns arise;  Patient verbalized understanding of information/instructions;
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
## 2020-01-30 NOTE — Telephone Encounter (Signed)
Please see additional charting for information concerning this patient

## 2020-02-06 ENCOUNTER — Ambulatory Visit: Payer: PPO | Admitting: Physical Therapy

## 2020-02-06 ENCOUNTER — Encounter: Payer: Self-pay | Admitting: Physical Therapy

## 2020-02-06 ENCOUNTER — Other Ambulatory Visit: Payer: Self-pay

## 2020-02-06 DIAGNOSIS — M6281 Muscle weakness (generalized): Secondary | ICD-10-CM

## 2020-02-06 DIAGNOSIS — M25552 Pain in left hip: Secondary | ICD-10-CM

## 2020-02-06 DIAGNOSIS — M25652 Stiffness of left hip, not elsewhere classified: Secondary | ICD-10-CM

## 2020-02-06 DIAGNOSIS — R278 Other lack of coordination: Secondary | ICD-10-CM

## 2020-02-06 NOTE — Patient Instructions (Addendum)
[email or web address] - vaginal wand  When you get the wand bring into therapy and we can go over on how to use it   Access Code: [email or web address]  URL: [email or web address]  Date: 02/06/2020  Prepared by: Earlie Counts   Exercises Hooklying Hamstring Stretch with Strap - 2 reps - 1 sets - 30 sec hold - 1x daily - 7x weekly Supine Pelvic Floor Stretch - 1 reps - 1 sets - 30 sec hold - 1x daily - 7x weekly Hip Adductors and Hamstring Stretch with Strap - 2 reps - 1 sets - 30 sec hold - 1x daily - 7x weekly Supine Diaphragmatic Breathing - 5 reps - 1 sets - 12x daily - 7x weekly Hooklying Transversus Abdominis Palpation - 10 reps - 1 sets - 5 sec hold - 1x daily - 7x weekly Hooklying Isometric Hip Flexion - 3 reps - 1 sets - 5 sec hold - 1x daily - 7x weekly [facility] [address] [place], [place] [address] Phone # [phone] Fax [phone]     Guided Meditation for Pelvic Floor Relaxation  [employer]
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
## 2020-02-10 ENCOUNTER — Telehealth: Payer: Self-pay | Admitting: Nurse Practitioner

## 2020-02-10 DIAGNOSIS — K643 Fourth degree hemorrhoids: Secondary | ICD-10-CM | POA: Diagnosis not present

## 2020-02-10 DIAGNOSIS — K642 Third degree hemorrhoids: Secondary | ICD-10-CM | POA: Diagnosis not present

## 2020-02-10 DIAGNOSIS — K581 Irritable bowel syndrome with constipation: Secondary | ICD-10-CM | POA: Diagnosis not present

## 2020-02-10 DIAGNOSIS — K648 Other hemorrhoids: Secondary | ICD-10-CM | POA: Diagnosis not present

## 2020-02-11 ENCOUNTER — Ambulatory Visit: Payer: Self-pay | Admitting: Surgery

## 2020-02-11 DIAGNOSIS — Z889 Allergy status to unspecified drugs, medicaments and biological substances status: Secondary | ICD-10-CM

## 2020-02-11 NOTE — Telephone Encounter (Signed)
Called and spoke with patient-patient requesting to keep the appt with Dr. Loletha Carrow to speak with him concerning the hemorrhoidectomy that [facility] Corliss Marcus is unsure if she wants to go through with the sx=the surgery is not set up yet-  Patient reports she is due for a colonoscopy -does she need to do this procedure prior to the hemorrhoidectomy or after? Can the surgeon that is doing this surgery do my colonoscopy?  Please advise

## 2020-02-12 ENCOUNTER — Other Ambulatory Visit: Payer: Self-pay

## 2020-02-12 ENCOUNTER — Ambulatory Visit: Payer: PPO | Admitting: Physical Therapy

## 2020-02-12 DIAGNOSIS — M25552 Pain in left hip: Secondary | ICD-10-CM

## 2020-02-12 DIAGNOSIS — M6281 Muscle weakness (generalized): Secondary | ICD-10-CM | POA: Diagnosis not present

## 2020-02-12 DIAGNOSIS — M25652 Stiffness of left hip, not elsewhere classified: Secondary | ICD-10-CM

## 2020-02-12 DIAGNOSIS — R278 Other lack of coordination: Secondary | ICD-10-CM

## 2020-02-12 NOTE — Telephone Encounter (Signed)
Appt cancelled

## 2020-02-12 NOTE — Telephone Encounter (Signed)
Patient is calling to follow up on previous message 
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
## 2020-02-12 NOTE — Telephone Encounter (Signed)
I spoke with Natalie Erickson, and reassured her that the colonoscopy with me should wait until a few months after the hemorrhoid surgery.  She is also currently undergoing pelvic floor PT, and believes she will continue that up to and then also after hemorrhoid surgery.  Please make sure her current colonoscopy recall is in June of this year.  Also, she does not need to come see me this Friday afternoon in clinic, please cancel that appointment.  She is aware

## 2020-02-12 NOTE — Telephone Encounter (Signed)
Please review previous messages and advise as patient doe not want to be charged for cancelling the appt on Friday am when office opens-

## 2020-02-12 NOTE — Patient Instructions (Addendum)
Skin Care and Bowel Hygiene  Anyone who has frequent bowel movements, diarrhea, or bowel leakage (fecal incontinence) may experience soreness or skin irritation around the anal region.  Occasionally, the skin can become so inflamed that it breaks into open sores.  Prevent skin breakdown by following good skin care habits.  Cleaning and Washing Techniques After having a bowel movement, men and women should tighten their anal sphincter before wiping.  Women should always wipe from front to back to prevent fecal matter from getting into the urethra and vagina.   Tips for Cleaning and Washing . wipe from front to back towards the anus . always wipe gently with soft toilet paper, or ideally with moist toilet paper . wipe only once with each piece of toilet paper so as not to re-contaminate the area . wash in warm water alone or with a minimal amount of mild, fragrance-free soap .  . gently pat skin completely dry, avoiding rubbing . if drying the skin after washing is difficult or uncomfortable, try using a hairdryer on a low setting (use very carefully) . allow air to get to the irritated area for some part of every day .   What To Avoid . baths with extra-hot water . soaking for long periods of time in the bathtub . disinfectants and antiseptics  . bath oils, bath salts, and talcum powder . using plastic pants, pads, and sheets, which cause sweating . scratching at the irritated area  Additional Tips . some people find that citrus and acidic foods cause or worsen skin irritation . eat a healthy, balanced diet that is high in fiber  . drink plenty of fluids . wear cotton underwear to allow the skin to breath . talk to your healthcare provider about further treatment options; persistent problems need medical attention  Earlie Counts, PT [address] [place], [place] [address]     [phone]   Lubrication . Used for intercourse to reduce friction . Avoid ones that have glycerin,  warming gels, tingling gels, icing or cooling gel, scented . Avoid parabens due to a preservative similar to female sex hormone . May need to be reapplied once or several times during sexual activity . Can be applied to both partners genitals prior to vaginal penetration to minimize friction or irritation . Prevent irritation and mucosal tears that cause post coital pain and increased the risk of vaginal and urinary tract infections . Oil-based lubricants cannot be used with condoms due to breaking them down.  Least likely to irritate vaginal tissue.  . Plant based-lubes are safe . Silicone-based lubrication are thicker and last long and used for post-menopausal women  Vaginal Lubricators Here is a list of some suggested lubricators you can use for intercourse. Use the most hypoallergenic product.  You can place on you or your partner.   Slippery Stuff ( water based)  Sylk or Sliquid Natural H2O ( good  if frequent UTI's)- walmart, amazon  Sliquid organics silk-(aloe and silicone based )  [employer] (www.blossom-organics.com)- (aloe based )  Coconut oil, olive oil -not good with condoms   PJur Woman Nude- (water based) amazon  Uberlube- ( silicon) [place] has an organic one  Yes lubricant- (water based and has plant oil based similar to silicone) [employer] Platinum-Silicone, Target, [employer]reens  Olive and Bee intimate cream-  www.oliveandbee.com.au  [place] Things to avoid in lubricants are glycerin, warming gels, tingling gels,  icing or cooling  gels, and scented gels.  Also avoid Vaseline. KY jelly, Replens, and Astroglide kills good bacteria(lactobacilli)  Things to avoid in the vaginal area . Do not use things to irritate the vulvar area . No lotions- see below . No soaps; can use Aveeno or [place]endula cleanser if needed. Must be gentle . No deodorants . No douches . Good to sleep without  underwear to let the vaginal area to air out . No scrubbing: spread the lips to let warm water rinse over labias and pat dry  Creams that can be used on the Vulva Area  V [employer], walmart  Vital V Wild Yam Salve  Julva- [employer] Botanical Pro-Meno Wild Yam Cream  Coconut oil, olive oil  Cleo by [employer] labial moisturizer -Amazon,   Desert [place] Releveum ( lidocaine) or Desert [employer]    [email or web address]-arnold.[email or web address]/blogs/videos/wand-videos   Video on how to use the wand

## 2020-02-13 ENCOUNTER — Ambulatory Visit: Payer: Self-pay | Admitting: Surgery

## 2020-02-14 ENCOUNTER — Ambulatory Visit: Payer: PPO | Admitting: Gastroenterology

## 2020-02-15 ENCOUNTER — Ambulatory Visit: Payer: PPO | Attending: Internal Medicine

## 2020-02-15 DIAGNOSIS — Z23 Encounter for immunization: Secondary | ICD-10-CM | POA: Insufficient documentation

## 2020-02-15 NOTE — Progress Notes (Signed)
   Covid-19 Vaccination Clinic  Name:  Natalie Erickson    MRN: [id number] DOB: December 13, 1951  02/15/2020  [name] was observed post Covid-19 immunization for 15 minutes without incidence. She was provided with Vaccine Information Sheet and instruction to access the V-Safe system.   [name] was instructed to call 911 with any severe reactions post vaccine: Marland Kitchen Difficulty breathing  . Swelling of your face and throat  . A fast heartbeat  . A bad rash all over your body  . Dizziness and weakness    Immunizations Administered    Name Date Dose VIS Date Route   Pfizer COVID-19 Vaccine 02/15/2020  9:43 AM 0.3 mL 12/06/2019 Intramuscular   Manufacturer: [place]   Lot: [id number]   [place]: [id number]

## 2020-02-20 ENCOUNTER — Other Ambulatory Visit: Payer: Self-pay

## 2020-02-20 ENCOUNTER — Ambulatory Visit: Payer: PPO | Admitting: Physical Therapy

## 2020-02-20 ENCOUNTER — Encounter: Payer: Self-pay | Admitting: Physical Therapy

## 2020-02-20 DIAGNOSIS — M6281 Muscle weakness (generalized): Secondary | ICD-10-CM

## 2020-02-20 DIAGNOSIS — M25652 Stiffness of left hip, not elsewhere classified: Secondary | ICD-10-CM

## 2020-02-20 DIAGNOSIS — R278 Other lack of coordination: Secondary | ICD-10-CM

## 2020-02-20 DIAGNOSIS — M25552 Pain in left hip: Secondary | ICD-10-CM

## 2020-02-20 NOTE — Patient Instructions (Signed)
Access Code: [email or web address]  URL: [email or web address]  Date: 02/20/2020  Prepared by: Earlie Counts   Exercises Hooklying Hamstring Stretch with Strap - 2 reps - 1 sets - 30 sec hold - 1x daily - 7x weekly Supine Pelvic Floor Stretch - 1 reps - 1 sets - 30 sec hold - 1x daily - 7x weekly Hip Adductors and Hamstring Stretch with Strap - 2 reps - 1 sets - 30 sec hold - 1x daily - 7x weekly Supine Diaphragmatic Breathing - 5 reps - 1 sets - 12x daily - 7x weekly Hooklying Transversus Abdominis Palpation - 10 reps - 1 sets - 5 sec hold - 1x daily - 7x weekly Hooklying Isometric Hip Flexion - 10 reps - 1 sets - 5 sec hold - 1x daily - 7x weekly Supine Pelvic Floor Contract and Release - 10 reps - 1 sets - 5 sec hold - 12x daily - 7x weekly [facility] [address] [place], [place] [address] Phone # [phone] Fax [phone]
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
## 2020-02-23 DIAGNOSIS — G4733 Obstructive sleep apnea (adult) (pediatric): Secondary | ICD-10-CM | POA: Diagnosis not present

## 2020-02-27 ENCOUNTER — Encounter: Payer: PPO | Admitting: Physical Therapy

## 2020-03-05 ENCOUNTER — Encounter: Payer: Self-pay | Admitting: Physical Therapy

## 2020-03-05 ENCOUNTER — Other Ambulatory Visit: Payer: Self-pay

## 2020-03-05 ENCOUNTER — Ambulatory Visit: Payer: PPO | Attending: Physician Assistant | Admitting: Physical Therapy

## 2020-03-05 DIAGNOSIS — M25552 Pain in left hip: Secondary | ICD-10-CM

## 2020-03-05 DIAGNOSIS — M6281 Muscle weakness (generalized): Secondary | ICD-10-CM | POA: Insufficient documentation

## 2020-03-05 DIAGNOSIS — M25652 Stiffness of left hip, not elsewhere classified: Secondary | ICD-10-CM

## 2020-03-05 DIAGNOSIS — R278 Other lack of coordination: Secondary | ICD-10-CM | POA: Diagnosis not present
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
## 2020-03-05 NOTE — Patient Instructions (Signed)
Access Code: [email or web address] URL: [email or web address] Date: 03/05/2020 Prepared by: Earlie Counts  Exercises Hooklying Hamstring Stretch with Strap - 2 reps - 1 sets - 30 sec hold - 1x daily - 7x weekly Supine Pelvic Floor Stretch - 1 reps - 1 sets - 30 sec hold - 1x daily - 7x weekly Hip Adductors and Hamstring Stretch with Strap - 2 reps - 1 sets - 30 sec hold - 1x daily - 7x weekly Supine Diaphragmatic Breathing - 5 reps - 1 sets - 12x daily - 7x weekly Hooklying Transversus Abdominis Palpation - 10 reps - 1 sets - 5 sec hold - 1x daily - 7x weekly Hooklying Isometric Hip Flexion - 10 reps - 1 sets - 5 sec hold - 1x daily - 7x weekly Supine Pelvic Floor Contract and Release - 10 reps - 1 sets - 5 sec hold - 12x daily - 7x weekly Supine Bridge with Mini Swiss Ball Between Knees - 15 reps - 1 sets - 1x daily - 7x weekly Bridge with Hip Abduction and Resistance - 15 reps - 1 sets - 1x daily - 7x weekly [facility] [address], [place], [place] [address] Phone # [phone] Fax [phone]

## 2020-03-10 ENCOUNTER — Ambulatory Visit: Payer: PPO | Attending: Internal Medicine

## 2020-03-10 DIAGNOSIS — Z23 Encounter for immunization: Secondary | ICD-10-CM

## 2020-03-10 NOTE — Progress Notes (Signed)
   Covid-19 Vaccination Clinic  Name:  Natalie Erickson    MRN: [id number] DOB: 02/10/1951  03/10/2020  [name] was observed post Covid-19 immunization for 15 minutes without incident. She was provided with Vaccine Information Sheet and instruction to access the V-Safe system.   [name] was instructed to call 911 with any severe reactions post vaccine: Marland Kitchen Difficulty breathing  . Swelling of face and throat  . A fast heartbeat  . A bad rash all over body  . Dizziness and weakness   Immunizations Administered    Name Date Dose VIS Date Route   Pfizer COVID-19 Vaccine 03/10/2020 10:03 AM 0.3 mL 12/06/2019 Intramuscular   Manufacturer: [place]   Lot: [id number]   [place]: [id number]

## 2020-03-11 ENCOUNTER — Encounter: Payer: Self-pay | Admitting: Physical Therapy

## 2020-03-11 ENCOUNTER — Telehealth: Payer: Self-pay | Admitting: Nurse Practitioner

## 2020-03-11 ENCOUNTER — Ambulatory Visit: Payer: PPO | Admitting: Physical Therapy

## 2020-03-11 ENCOUNTER — Other Ambulatory Visit: Payer: Self-pay

## 2020-03-11 DIAGNOSIS — R278 Other lack of coordination: Secondary | ICD-10-CM

## 2020-03-11 DIAGNOSIS — M6281 Muscle weakness (generalized): Secondary | ICD-10-CM | POA: Diagnosis not present

## 2020-03-11 DIAGNOSIS — M25652 Stiffness of left hip, not elsewhere classified: Secondary | ICD-10-CM

## 2020-03-11 DIAGNOSIS — M25552 Pain in left hip: Secondary | ICD-10-CM

## 2020-03-11 NOTE — Telephone Encounter (Signed)
Called and spoke with patient -patient is requesting to come to the lab at [facility] and complete a urine screen to check to see if she has a UTI -wanting to know if an order can be placed for urinalysis -patient reports she wants to go ahead and see if this is what is causing her symptoms late at night and early in the morning and for MD to send in RX for antibx -patient reports her surgery is scheduled for 04/02/2020  -patient reports she has not and did not want to call her PCP for this "issue" as Dr. Loletha Carrow prescribed the last antibx for the last UTI Please advise

## 2020-03-11 NOTE — Telephone Encounter (Signed)
Pt stated that she suspects that she has a UTI.  She said that she is due for surgery and would like to discuss.

## 2020-03-11 NOTE — Telephone Encounter (Signed)
Called and spoke with patient-patient informed of MD recommendations; patient is agreeable with plan of care and reports she will contact her PCP for this RX; Patient verbalized understanding of information/instructions;  Patient was advised to call the office at [phone] if questions/concerns arise;
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
## 2020-03-11 NOTE — Telephone Encounter (Signed)
Please kindly request that Natalie Erickson contact her primary care provider about that.  I do not recall having managed a UTI for her in the past and am not accustomed to doing so.

## 2020-03-12 ENCOUNTER — Encounter: Payer: Self-pay | Admitting: Family Medicine

## 2020-03-12 ENCOUNTER — Ambulatory Visit (INDEPENDENT_AMBULATORY_CARE_PROVIDER_SITE_OTHER): Payer: PPO | Admitting: Family Medicine

## 2020-03-12 VITALS — BP 110/72 | HR 75 | Temp 97.4°F | Ht 61.5 in | Wt 145.2 lb

## 2020-03-12 DIAGNOSIS — K649 Unspecified hemorrhoids: Secondary | ICD-10-CM | POA: Diagnosis not present

## 2020-03-12 DIAGNOSIS — M5481 Occipital neuralgia: Secondary | ICD-10-CM

## 2020-03-12 DIAGNOSIS — R3915 Urgency of urination: Secondary | ICD-10-CM | POA: Diagnosis not present

## 2020-03-12 MED ORDER — TRIMETHOPRIM 100 MG PO TABS: 100.0000 mg | ORAL_TABLET | Freq: Two times a day (BID) | ORAL | 0 refills | Status: DC

## 2020-03-12 NOTE — Assessment & Plan Note (Signed)
Stable.  She is trying to wean off gabapentin as she thinks this is contributing to her recurrent UTI.

## 2020-03-12 NOTE — Assessment & Plan Note (Signed)
Stable.  Has been doing frequent sitz baths which likely contributed to recurrent UTI.  She is currently scheduled to have hemorrhoidectomy soon.

## 2020-03-12 NOTE — Progress Notes (Signed)
   Natalie Erickson is a 69 y.o. female who presents today for an office visit.  Assessment/Plan:  New/Acute Problems: UTI Symptoms consistent with prior UTI.  Likely due to frequent sits baths due to hemorrhoids.  Will start trimethoprim 100 mg twice daily for 7 days as this worked well for her in the past that she has extensive allergy list.  Encourage good oral hydration.  Will check urine culture.  No signs of systemic illness.  Chronic Problems Addressed Today: Bilateral occipital neuralgia Stable.  She is trying to wean off gabapentin as she thinks this is contributing to her recurrent UTI.  Hemorrhoids Stable.  Has been doing frequent sitz baths which likely contributed to recurrent UTI.  She is currently scheduled to have hemorrhoidectomy soon.     Subjective:  HPI:   Patient here with UTI symptoms for the past week or so.  Has been having burning with urination.  Is been going on for the past week or so.  She currently has hemorrhoids and has been doing sitz bath's which she thinks could have contributed.  She is also taking gabapentin for her occipital neuralgia which she thinks could have contributed as well.  No fevers or chills.  Some mild low back pain.  Feels similar to prior UTIs.  She was previously treated with trimethoprim did well with this.  She has been try to take good oral hydration.  Symptoms are overall stable.       Objective:  Physical Exam: BP 110/72   Pulse 75   Temp (!) 97.4 F (36.3 C)   Ht 5' 1.5" (1.562 m)   Wt 145 lb 4 oz (65.9 kg)   SpO2 99%   BMI 27.00 kg/m   Gen: No acute distress, resting comfortably CV: Regular rate and rhythm with no murmurs appreciated Pulm: Normal work of breathing, clear to auscultation bilaterally with no crackles, wheezes, or rhonchi Neuro: Grossly normal, moves all extremities Psych: Normal affect and thought content      Quinita Kostelecky M. [name], MD 03/12/2020 9:17 AM

## 2020-03-12 NOTE — Patient Instructions (Signed)
It was nice to see you!  You have a urinary tract infection. Please start the antibiotic.  We will check a urine culture to make sure you do not have a resistant bacteria. We will call you if we need to change your medications.   Please make sure you are drinking plenty of fluids over the next few days.  If your symptoms do not improve over the next 5-7 days, or if they worsen, please let us know. Please also let us know if you have worsening back pain, fevers, chills, or body aches.   Take care, Dr Jotham Ahn  

## 2020-03-14 LAB — URINE CULTURE
MICRO NUMBER:: 10266461
SPECIMEN QUALITY:: ADEQUATE

## 2020-03-16 NOTE — Progress Notes (Signed)
Please inform patient of the following:  Urine culture confirms UTI. The antibiotic she is on should treat this. Would like for her to finish her antibiotics and let us know if symptoms have not improved.  [name]. [name], MD 03/16/2020 8:10 AM

## 2020-03-17 ENCOUNTER — Telehealth: Payer: Self-pay | Admitting: Family Medicine

## 2020-03-17 NOTE — Telephone Encounter (Signed)
Pt called stating she has been on an antibiotic for the past 5 days. Pt states she is experiencing nausea, headache, & diarrhea from medication. Pt asked if she can stop taking the antibiotic. Pt states she is going to skip taking the pill this morning. Please advise.

## 2020-03-17 NOTE — Telephone Encounter (Signed)
Left message to return call to our office.  

## 2020-03-17 NOTE — Telephone Encounter (Signed)
Ok for her to stop. The infection should be cleared. Would like for her to let us know if UTI symptoms come back and we can send in an alternative.  [name]. [name], MD 03/17/2020 12:53 PM

## 2020-03-17 NOTE — Telephone Encounter (Signed)
Please advise 

## 2020-03-18 ENCOUNTER — Encounter: Payer: Self-pay | Admitting: General Practice

## 2020-03-18 NOTE — Telephone Encounter (Signed)
Patient notified

## 2020-03-20 ENCOUNTER — Telehealth: Payer: Self-pay

## 2020-03-20 ENCOUNTER — Other Ambulatory Visit: Payer: Self-pay

## 2020-03-20 DIAGNOSIS — N909 Noninflammatory disorder of vulva and perineum, unspecified: Secondary | ICD-10-CM | POA: Diagnosis not present

## 2020-03-20 DIAGNOSIS — N907 Vulvar cyst: Secondary | ICD-10-CM | POA: Diagnosis not present

## 2020-03-20 MED ORDER — NITROFURANTOIN MONOHYD MACRO 100 MG PO CAPS: 100.0000 mg | ORAL_CAPSULE | Freq: Two times a day (BID) | ORAL | 0 refills | Status: DC

## 2020-03-20 NOTE — Telephone Encounter (Signed)
Patient notified Rx sent

## 2020-03-20 NOTE — Telephone Encounter (Signed)
Spoke with Patient stated still have frequency urination worsen at night and some discomfort during the day. Requesting if is possible to have a second round of antibiotic. Do not want to be prescribed Rx Cipro. Pt will like to know if she can use Probiotic and if PCP have any recommendation  Please advised

## 2020-03-20 NOTE — Telephone Encounter (Signed)
Patient was recently seen for UTI. Patient still experiencing symptoms. Patient would like to know could she possible try another medication to get rid of symptoms

## 2020-03-20 NOTE — Telephone Encounter (Signed)
Ok to send in macrobid 100mg  bid x 7 days.  [name]. [name], MD 03/20/2020 10:30 AM

## 2020-03-23 ENCOUNTER — Telehealth: Payer: Self-pay | Admitting: Family Medicine

## 2020-03-23 NOTE — Telephone Encounter (Signed)
Should not cause kidney stones but I think taking plenty of fluids is a good idea regardless.  [name]. [name], MD 03/23/2020 4:06 PM

## 2020-03-23 NOTE — Telephone Encounter (Signed)
Notified voices understanding

## 2020-03-23 NOTE — Telephone Encounter (Signed)
Pt called stating she stopped taking the antibiotic she was prescribed on 3/18 for her UTI due to it making her feel sick. Pt wanted to inform Dr. [name] that she began to develop sores around her vaginal area and went to her gynecologist. Pt was prescribed Bactrim and is currently taking that medication.

## 2020-03-23 NOTE — Telephone Encounter (Signed)
Spoke with patient she was prescribed Bactrim she wants to know if this Rx will cause kidney stones and what does she need to do to avoid kidney stones while taking Rx. I informed patient to drink plenty of fluids while taking Rx. please advise

## 2020-03-24 DIAGNOSIS — G4733 Obstructive sleep apnea (adult) (pediatric): Secondary | ICD-10-CM | POA: Diagnosis not present

## 2020-03-26 ENCOUNTER — Other Ambulatory Visit: Payer: Self-pay

## 2020-03-26 ENCOUNTER — Encounter (HOSPITAL_BASED_OUTPATIENT_CLINIC_OR_DEPARTMENT_OTHER): Payer: Self-pay | Admitting: Surgery

## 2020-03-26 DIAGNOSIS — N9089 Other specified noninflammatory disorders of vulva and perineum: Secondary | ICD-10-CM | POA: Insufficient documentation

## 2020-03-26 DIAGNOSIS — N909 Noninflammatory disorder of vulva and perineum, unspecified: Secondary | ICD-10-CM | POA: Diagnosis not present

## 2020-03-26 DIAGNOSIS — N907 Vulvar cyst: Secondary | ICD-10-CM | POA: Insufficient documentation

## 2020-03-26 NOTE — Progress Notes (Addendum)
Addendum : spoke with patient by phone and patient stated she called surgeon office and was told she did not need to bring cpap day of surgery.   Spoke w/ via phone for pre-op interview---Natalie Erickson needs dos---- I stat 8              COVID test ------03-30-2020 1050 am Arrive at -------530 am 04-02-2020 NPO after ------midnight Medications to take morning of surgery -----eye drop Diabetic medication -----n/a Patient Special Instructions -----follow all bowel prep instructions from dr gross Pre-Op special Istructions -----none Patient verbalized understanding of instructions that were given at this phone interview. Patient denies shortness of breath, chest pain, fever, cough a this phone interview.

## 2020-03-27 DIAGNOSIS — G4733 Obstructive sleep apnea (adult) (pediatric): Secondary | ICD-10-CM | POA: Diagnosis not present

## 2020-03-30 ENCOUNTER — Other Ambulatory Visit (HOSPITAL_COMMUNITY)
Admission: RE | Admit: 2020-03-30 | Discharge: 2020-03-30 | Disposition: A | Discharge status: Home / Self Care | Payer: PPO | Source: Ambulatory Visit | Attending: Surgery | Admitting: Surgery

## 2020-03-30 ENCOUNTER — Encounter (HOSPITAL_BASED_OUTPATIENT_CLINIC_OR_DEPARTMENT_OTHER): Payer: Self-pay | Admitting: Surgery

## 2020-03-30 DIAGNOSIS — Z01812 Encounter for preprocedural laboratory examination: Secondary | ICD-10-CM | POA: Diagnosis not present

## 2020-03-30 DIAGNOSIS — Z20822 Contact with and (suspected) exposure to covid-19: Secondary | ICD-10-CM | POA: Diagnosis not present

## 2020-03-30 LAB — SARS CORONAVIRUS 2 (TAT 6-24 HRS): SARS Coronavirus 2: NEGATIVE

## 2020-04-02 ENCOUNTER — Encounter (HOSPITAL_BASED_OUTPATIENT_CLINIC_OR_DEPARTMENT_OTHER): Payer: Self-pay | Admitting: Surgery

## 2020-04-02 ENCOUNTER — Ambulatory Visit (HOSPITAL_BASED_OUTPATIENT_CLINIC_OR_DEPARTMENT_OTHER): Payer: PPO | Admitting: Anesthesiology

## 2020-04-02 ENCOUNTER — Encounter (HOSPITAL_BASED_OUTPATIENT_CLINIC_OR_DEPARTMENT_OTHER)
Admission: RE | Disposition: A | Discharge status: Home / Self Care | Payer: Self-pay | Source: Home / Self Care | Attending: Surgery

## 2020-04-02 ENCOUNTER — Other Ambulatory Visit: Payer: Self-pay

## 2020-04-02 ENCOUNTER — Ambulatory Visit (HOSPITAL_BASED_OUTPATIENT_CLINIC_OR_DEPARTMENT_OTHER)
Admission: RE | Admit: 2020-04-02 | Discharge: 2020-04-02 | Disposition: A | Discharge status: Home / Self Care | Payer: PPO | Attending: Surgery | Admitting: Surgery

## 2020-04-02 DIAGNOSIS — Z91040 Latex allergy status: Secondary | ICD-10-CM | POA: Diagnosis not present

## 2020-04-02 DIAGNOSIS — G4730 Sleep apnea, unspecified: Secondary | ICD-10-CM | POA: Diagnosis not present

## 2020-04-02 DIAGNOSIS — F419 Anxiety disorder, unspecified: Secondary | ICD-10-CM | POA: Diagnosis not present

## 2020-04-02 DIAGNOSIS — K649 Unspecified hemorrhoids: Secondary | ICD-10-CM | POA: Diagnosis not present

## 2020-04-02 DIAGNOSIS — R030 Elevated blood-pressure reading, without diagnosis of hypertension: Secondary | ICD-10-CM | POA: Diagnosis not present

## 2020-04-02 DIAGNOSIS — Z86010 Personal history of colonic polyps: Secondary | ICD-10-CM | POA: Insufficient documentation

## 2020-04-02 DIAGNOSIS — Z90721 Acquired absence of ovaries, unilateral: Secondary | ICD-10-CM | POA: Diagnosis not present

## 2020-04-02 DIAGNOSIS — I10 Essential (primary) hypertension: Secondary | ICD-10-CM | POA: Diagnosis not present

## 2020-04-02 DIAGNOSIS — E782 Mixed hyperlipidemia: Secondary | ICD-10-CM | POA: Insufficient documentation

## 2020-04-02 DIAGNOSIS — Z880 Allergy status to penicillin: Secondary | ICD-10-CM | POA: Insufficient documentation

## 2020-04-02 DIAGNOSIS — Z881 Allergy status to other antibiotic agents status: Secondary | ICD-10-CM | POA: Insufficient documentation

## 2020-04-02 DIAGNOSIS — K644 Residual hemorrhoidal skin tags: Secondary | ICD-10-CM | POA: Diagnosis not present

## 2020-04-02 DIAGNOSIS — M797 Fibromyalgia: Secondary | ICD-10-CM | POA: Diagnosis not present

## 2020-04-02 DIAGNOSIS — Z8371 Family history of colonic polyps: Secondary | ICD-10-CM | POA: Insufficient documentation

## 2020-04-02 DIAGNOSIS — Z888 Allergy status to other drugs, medicaments and biological substances status: Secondary | ICD-10-CM | POA: Insufficient documentation

## 2020-04-02 DIAGNOSIS — K589 Irritable bowel syndrome without diarrhea: Secondary | ICD-10-CM | POA: Insufficient documentation

## 2020-04-02 DIAGNOSIS — Z8249 Family history of ischemic heart disease and other diseases of the circulatory system: Secondary | ICD-10-CM | POA: Diagnosis not present

## 2020-04-02 DIAGNOSIS — Z885 Allergy status to narcotic agent status: Secondary | ICD-10-CM | POA: Insufficient documentation

## 2020-04-02 DIAGNOSIS — K643 Fourth degree hemorrhoids: Secondary | ICD-10-CM | POA: Diagnosis not present

## 2020-04-02 HISTORY — DX: Other shoulder lesions, unspecified shoulder: M75.80

## 2020-04-02 HISTORY — DX: Pain in unspecified hip: M25.559

## 2020-04-02 HISTORY — PX: EVALUATION UNDER ANESTHESIA WITH HEMORRHOIDECTOMY: SHX5624

## 2020-04-02 HISTORY — DX: Nontoxic single thyroid nodule: E04.1

## 2020-04-02 HISTORY — DX: Hypercalcemia: E83.52

## 2020-04-02 HISTORY — DX: Personal history of urinary calculi: Z87.442

## 2020-04-02 HISTORY — DX: Other vitreous opacities, unspecified eye: H43.399

## 2020-04-02 HISTORY — DX: Sleep apnea, unspecified: G47.30

## 2020-04-02 HISTORY — DX: Urinary tract infection, site not specified: N39.0

## 2020-04-02 LAB — POCT I-STAT, CHEM 8
BUN: 13 mg/dL (ref 8–23)
Calcium, Ion: 1.28 mmol/L (ref 1.15–1.40)
Chloride: 103 mmol/L (ref 98–111)
Creatinine, Ser: 0.7 mg/dL (ref 0.44–1.00)
Glucose, Bld: 100 mg/dL — ABNORMAL HIGH (ref 70–99)
HCT: 39 % (ref 36.0–46.0)
Hemoglobin: 13.3 g/dL (ref 12.0–15.0)
Potassium: 3.8 mmol/L (ref 3.5–5.1)
Sodium: 140 mmol/L (ref 135–145)
TCO2: 31 mmol/L (ref 22–32)

## 2020-04-02 SURGERY — EXAM UNDER ANESTHESIA WITH HEMORRHOIDECTOMY
Anesthesia: General | Site: Rectum

## 2020-04-02 MED ORDER — CHLORHEXIDINE GLUCONATE CLOTH 2 % EX PADS
6.0000 | MEDICATED_PAD | Freq: Once | CUTANEOUS | Status: DC
  Filled 2020-04-02: qty 6

## 2020-04-02 MED ORDER — AMISULPRIDE (ANTIEMETIC) 5 MG/2ML IV SOLN
5.0000 mg | Freq: Once | INTRAVENOUS | Status: AC
  Administered 2020-04-02: 5 mg via INTRAVENOUS
  Filled 2020-04-02: qty 2

## 2020-04-02 MED ORDER — HYDROMORPHONE HCL 1 MG/ML IJ SOLN
0.2500 mg | INTRAMUSCULAR | Status: DC | PRN
  Administered 2020-04-02: 0.25 mg via INTRAVENOUS
  Filled 2020-04-02: qty 0.5

## 2020-04-02 MED ORDER — BUPIVACAINE LIPOSOME 1.3 % IJ SUSP
INTRAMUSCULAR | Status: DC | PRN
  Administered 2020-04-02: 20 mL

## 2020-04-02 MED ORDER — MIDAZOLAM HCL 2 MG/2ML IJ SOLN
INTRAMUSCULAR | Status: AC
  Filled 2020-04-02: qty 2

## 2020-04-02 MED ORDER — KETOROLAC TROMETHAMINE 15 MG/ML IJ SOLN
INTRAMUSCULAR | Status: AC
  Filled 2020-04-02: qty 1

## 2020-04-02 MED ORDER — PHENYLEPHRINE 40 MCG/ML (10ML) SYRINGE FOR IV PUSH (FOR BLOOD PRESSURE SUPPORT)
PREFILLED_SYRINGE | INTRAVENOUS | Status: AC
  Filled 2020-04-02: qty 20

## 2020-04-02 MED ORDER — ROCURONIUM BROMIDE 50 MG/5ML IV SOSY
PREFILLED_SYRINGE | INTRAVENOUS | Status: DC | PRN
  Administered 2020-04-02: 70 mg via INTRAVENOUS

## 2020-04-02 MED ORDER — GENTAMICIN SULFATE 40 MG/ML IJ SOLN
330.0000 mg | INTRAVENOUS | Status: AC
  Administered 2020-04-02: 330 mg via INTRAVENOUS
  Filled 2020-04-02: qty 8.25

## 2020-04-02 MED ORDER — GABAPENTIN 300 MG PO CAPS
ORAL_CAPSULE | ORAL | Status: AC
  Filled 2020-04-02: qty 1

## 2020-04-02 MED ORDER — LIDOCAINE 2% (20 MG/ML) 5 ML SYRINGE
INTRAMUSCULAR | Status: DC | PRN
  Administered 2020-04-02: 100 mg via INTRAVENOUS

## 2020-04-02 MED ORDER — ACETAMINOPHEN 500 MG PO TABS
1000.0000 mg | ORAL_TABLET | ORAL | Status: AC
  Administered 2020-04-02: 1000 mg via ORAL
  Filled 2020-04-02: qty 2

## 2020-04-02 MED ORDER — AMISULPRIDE (ANTIEMETIC) 5 MG/2ML IV SOLN
INTRAVENOUS | Status: AC
  Filled 2020-04-02: qty 2

## 2020-04-02 MED ORDER — MEPERIDINE HCL 25 MG/ML IJ SOLN
6.2500 mg | INTRAMUSCULAR | Status: DC | PRN
  Filled 2020-04-02: qty 1

## 2020-04-02 MED ORDER — GABAPENTIN 300 MG PO CAPS
300.0000 mg | ORAL_CAPSULE | ORAL | Status: AC
  Administered 2020-04-02: 300 mg via ORAL
  Filled 2020-04-02: qty 1

## 2020-04-02 MED ORDER — BUPIVACAINE LIPOSOME 1.3 % IJ SUSP
20.0000 mL | Freq: Once | INTRAMUSCULAR | Status: DC
  Filled 2020-04-02: qty 20

## 2020-04-02 MED ORDER — FENTANYL CITRATE (PF) 100 MCG/2ML IJ SOLN
INTRAMUSCULAR | Status: AC
  Filled 2020-04-02: qty 2

## 2020-04-02 MED ORDER — CLINDAMYCIN PHOSPHATE 900 MG/50ML IV SOLN
900.0000 mg | INTRAVENOUS | Status: AC
  Administered 2020-04-02: 900 mg via INTRAVENOUS
  Filled 2020-04-02: qty 50

## 2020-04-02 MED ORDER — LIDOCAINE 2% (20 MG/ML) 5 ML SYRINGE
INTRAMUSCULAR | Status: AC
  Filled 2020-04-02: qty 5

## 2020-04-02 MED ORDER — GENTAMICIN SULFATE 40 MG/ML IJ SOLN
320.0000 mg | INTRAVENOUS | Status: DC
  Filled 2020-04-02: qty 8

## 2020-04-02 MED ORDER — PROPOFOL 10 MG/ML IV BOLUS
INTRAVENOUS | Status: DC | PRN
  Administered 2020-04-02: 100 mg via INTRAVENOUS

## 2020-04-02 MED ORDER — ACETAMINOPHEN 500 MG PO TABS
ORAL_TABLET | ORAL | Status: AC
  Filled 2020-04-02: qty 2

## 2020-04-02 MED ORDER — SCOPOLAMINE 1 MG/3DAYS TD PT72
1.0000 | MEDICATED_PATCH | TRANSDERMAL | Status: DC
  Administered 2020-04-02: 1.5 mg via TRANSDERMAL
  Filled 2020-04-02: qty 1

## 2020-04-02 MED ORDER — CLINDAMYCIN PHOSPHATE 900 MG/50ML IV SOLN
INTRAVENOUS | Status: AC
  Filled 2020-04-02: qty 50

## 2020-04-02 MED ORDER — PHENYLEPHRINE 40 MCG/ML (10ML) SYRINGE FOR IV PUSH (FOR BLOOD PRESSURE SUPPORT)
PREFILLED_SYRINGE | INTRAVENOUS | Status: DC | PRN
  Administered 2020-04-02: 80 ug via INTRAVENOUS
  Administered 2020-04-02: 40 ug via INTRAVENOUS
  Administered 2020-04-02 (×3): 80 ug via INTRAVENOUS
  Administered 2020-04-02: 40 ug via INTRAVENOUS
  Administered 2020-04-02 (×2): 80 ug via INTRAVENOUS
  Administered 2020-04-02: 120 ug via INTRAVENOUS

## 2020-04-02 MED ORDER — SUGAMMADEX SODIUM 200 MG/2ML IV SOLN
INTRAVENOUS | Status: DC | PRN
  Administered 2020-04-02: 140 mg via INTRAVENOUS

## 2020-04-02 MED ORDER — HYDROMORPHONE HCL 1 MG/ML IJ SOLN
INTRAMUSCULAR | Status: AC
  Filled 2020-04-02: qty 1

## 2020-04-02 MED ORDER — KETOROLAC TROMETHAMINE 15 MG/ML IJ SOLN
15.0000 mg | Freq: Once | INTRAMUSCULAR | Status: AC
  Administered 2020-04-02: 15 mg via INTRAVENOUS
  Filled 2020-04-02: qty 1

## 2020-04-02 MED ORDER — LACTATED RINGERS IV SOLN
INTRAVENOUS | Status: DC
  Filled 2020-04-02 (×3): qty 1000

## 2020-04-02 MED ORDER — MIDAZOLAM HCL 5 MG/5ML IJ SOLN
INTRAMUSCULAR | Status: DC | PRN
  Administered 2020-04-02 (×2): 1 mg via INTRAVENOUS

## 2020-04-02 MED ORDER — OXYCODONE HCL 5 MG PO TABS: 5.0000 mg | ORAL_TABLET | Freq: Four times a day (QID) | ORAL | 0 refills | Status: DC | PRN

## 2020-04-02 MED ORDER — ROCURONIUM BROMIDE 10 MG/ML (PF) SYRINGE
PREFILLED_SYRINGE | INTRAVENOUS | Status: AC
  Filled 2020-04-02: qty 10

## 2020-04-02 MED ORDER — BUPIVACAINE-EPINEPHRINE 0.5% -1:200000 IJ SOLN
INTRAMUSCULAR | Status: DC | PRN
  Administered 2020-04-02: 20 mL

## 2020-04-02 MED ORDER — IBUPROFEN 800 MG PO TABS: 800.0000 mg | ORAL_TABLET | Freq: Three times a day (TID) | ORAL | 2 refills | Status: DC | PRN

## 2020-04-02 MED ORDER — SCOPOLAMINE 1 MG/3DAYS TD PT72
MEDICATED_PATCH | TRANSDERMAL | Status: AC
  Filled 2020-04-02: qty 1

## 2020-04-02 MED ORDER — PROPOFOL 10 MG/ML IV BOLUS
INTRAVENOUS | Status: AC
  Filled 2020-04-02: qty 40

## 2020-04-02 SURGICAL SUPPLY — 53 items
BENZOIN TINCTURE PRP APPL 2/3 (GAUZE/BANDAGES/DRESSINGS) ×2 IMPLANT
BLADE HEX COATED 2.75 (ELECTRODE) ×2 IMPLANT
BLADE SURG 10 STRL SS (BLADE) IMPLANT
BLADE SURG 15 STRL LF DISP TIS (BLADE) ×1 IMPLANT
BLADE SURG 15 STRL SS (BLADE) ×1
BRIEF STRETCH FOR OB PAD LRG (UNDERPADS AND DIAPERS) ×2 IMPLANT
CANISTER SUCT 1200ML W/VALVE (MISCELLANEOUS) ×2 IMPLANT
COVER BACK TABLE 60X90IN (DRAPES) ×2 IMPLANT
COVER MAYO STAND STRL (DRAPES) ×2 IMPLANT
COVER WAND RF STERILE (DRAPES) ×2 IMPLANT
DECANTER SPIKE VIAL GLASS SM (MISCELLANEOUS) IMPLANT
DRAPE HYSTEROSCOPY (DRAPE) IMPLANT
DRAPE LAPAROTOMY 100X72 PEDS (DRAPES) ×2 IMPLANT
DRAPE SHEET LG 3/4 BI-LAMINATE (DRAPES) IMPLANT
DRSG PAD ABDOMINAL 8X10 ST (GAUZE/BANDAGES/DRESSINGS) ×2 IMPLANT
ELECT NEEDLE TIP 2.8 STRL (NEEDLE) IMPLANT
ELECT REM PT RETURN 9FT ADLT (ELECTROSURGICAL) ×2
ELECTRODE REM PT RTRN 9FT ADLT (ELECTROSURGICAL) ×1 IMPLANT
FILTER STRAW (MISCELLANEOUS) IMPLANT
GAUZE SPONGE 4X4 12PLY STRL LF (GAUZE/BANDAGES/DRESSINGS) ×2 IMPLANT
GLOVE ECLIPSE 8.0 STRL XLNG CF (GLOVE) ×2 IMPLANT
GLOVE INDICATOR 8.0 STRL GRN (GLOVE) IMPLANT
GOWN STRL REUS W/TWL XL LVL3 (GOWN DISPOSABLE) ×2 IMPLANT
IV CATH PLACEMENT 20 GA (IV SOLUTION) IMPLANT
KIT SIGMOIDOSCOPE (SET/KITS/TRAYS/PACK) IMPLANT
KIT TURNOVER CYSTO (KITS) ×2 IMPLANT
LEGGING LITHOTOMY PAIR STRL (DRAPES) IMPLANT
NEEDLE HYPO 22GX1.5 SAFETY (NEEDLE) ×2 IMPLANT
NS IRRIG 500ML POUR BTL (IV SOLUTION) ×2 IMPLANT
PACK BASIN DAY SURGERY FS (CUSTOM PROCEDURE TRAY) ×2 IMPLANT
PAD PREP 24X48 CUFFED NSTRL (MISCELLANEOUS) IMPLANT
PENCIL BUTTON HOLSTER BLD 10FT (ELECTRODE) ×2 IMPLANT
SCRUB TECHNI CARE 4 OZ NO DYE (MISCELLANEOUS) ×2 IMPLANT
SHEARS HARMONIC 9CM CVD (BLADE) IMPLANT
SURGILUBE 2OZ TUBE FLIPTOP (MISCELLANEOUS) ×2 IMPLANT
SUT CHROMIC 2 0 SH (SUTURE) IMPLANT
SUT CHROMIC 3 0 SH 27 (SUTURE) ×4 IMPLANT
SUT VIC AB 2-0 SH 27 (SUTURE)
SUT VIC AB 2-0 SH 27XBRD (SUTURE) IMPLANT
SUT VIC AB 2-0 UR6 27 (SUTURE) ×12 IMPLANT
SUT VICRYL 0 UR6 27IN ABS (SUTURE) IMPLANT
SUT VICRYL AB 2 0 TIE (SUTURE) IMPLANT
SUT VICRYL AB 2 0 TIES (SUTURE)
SYR 20ML LL LF (SYRINGE) ×2 IMPLANT
SYR 27GX1/2 1ML LL SAFETY (SYRINGE) IMPLANT
SYR BULB IRRIGATION 50ML (SYRINGE) IMPLANT
SYR CONTROL 10ML LL (SYRINGE) IMPLANT
TAPE CLOTH 3X10 TAN LF (GAUZE/BANDAGES/DRESSINGS) ×2 IMPLANT
TOWEL OR 17X26 10 PK STRL BLUE (TOWEL DISPOSABLE) ×2 IMPLANT
TRAY DSU PREP LF (CUSTOM PROCEDURE TRAY) ×2 IMPLANT
TUBE CONNECTING 12X1/4 (SUCTIONS) ×2 IMPLANT
UNDERPAD 30X30 (UNDERPADS AND DIAPERS) ×2 IMPLANT
YANKAUER SUCT BULB TIP NO VENT (SUCTIONS) ×2 IMPLANT

## 2020-04-02 NOTE — H&P (Signed)
Natalie Erickson DOB: February 06, 1951 Married / Language: English / Race: White Female    ` ` Patient sent for surgical consultation at the request of Dr Loletha Carrow  Chief Complaint: Worsening hemorrhoids. ` ` The patient is a pleasant woman. Former [employer] scientist. History of anxiety and irritable bowel. Claims to have chronic constipation. Eating gastrology in the past. Sent colonoscopies in the past that showed diverticulosis. She noted she's had hemorrhoidal issues for many years. She will get flares after constipated stool with swelling and bleeding. Burning as well. Sitz bath usually help. The often pop out. She eventually can get them to reduce. She was having worsening symptoms or hemorrhoids. Saw gastrology's in the hopes that they could be banded. Was worried about using latex bands. Colonoscopy offered. Sounds like she change her mind and wished to see surgery. She has been doing pelvic floor physical therapy which was help with some stretching and relaxation. She still has bleeding and prolapse. She does not smoke. She is not diabetic. She used to do yoga and more regular exercise preCOVID. Less active female. Had surgery for removal right ovarian cyst in 1975. No other abdominal surgeries.  No personal nor family history of GI/colon cancer, inflammatory bowel disease, allergy such as Celiac Sprue, dietary/dairy problems, colitis, ulcers nor gastritis. No recent sick contacts/gastroenteritis. No travel outside the country. No changes in diet. No dysphagia to solids or liquids. No significant heartburn or reflux. No hematochezia, hematemesis, coffee ground emesis. No evidence of prior gastric/peptic ulceration.  (Review of systems as stated in this history (HPI) or in the review of systems. Otherwise all other 12 point ROS are negative) ` ` `  This patient encounter took 25 minutes today to perform the following: obtain history, perform exam, review outside records,  interpret tests & imaging, counsel the patient on their diagnosis; and, document this encounter, including findings & plan in the electronic health record (EHR).   Past Surgical History [facility] Teressa Senter, [place]; 02/10/2020 2:35 PM) Appendectomy  Colon Polyp Removal - Colonoscopy   Diagnostic Studies History (Chanel Teressa Senter, CMA; 02/10/2020 2:35 PM) Colonoscopy  1-5 years ago Mammogram  within last year Pap Smear  1-5 years ago  Allergies Emmaline Kluver Teressa Senter, CMA; 02/10/2020 2:38 PM) Penicillins  Statins  Latex  Allergies Reconciled   Medication History (Chanel Teressa Senter, CMA; 02/10/2020 2:40 PM) Gabapentin (100MG  Capsule, Oral) Active. Polyethylene Glycol 3350 (17GM Packet, Oral) Active. Cholecalciferol (50 MCG(2000 UT) Tablet, Oral) Active. Medications Reconciled  Social History Antonietta Jewel, CMA; 02/10/2020 2:35 PM) Alcohol use  Occasional alcohol use. Caffeine use  Coffee, Tea. No drug use  Tobacco use  Never smoker.  Family History Antonietta Jewel, [place]; 02/10/2020 2:35 PM) Alcohol Abuse  Mother. Cancer  Father. Colon Polyps  Brother. Hypertension  Mother.  Pregnancy / Birth History Antonietta Jewel, [place]; 02/10/2020 2:35 PM) Contraceptive History  Oral contraceptives. Length (months) of breastfeeding  7-12 Maternal age  12-30  Other Problems Antonietta Jewel, [place]; 02/10/2020 2:35 PM) Anxiety Disorder  Hemorrhoids  High blood pressure  Kidney Stone  Sleep Apnea     Review of Systems (Chanel Nolan CMA; 02/10/2020 2:35 PM) Skin Not Present- Change in Wart/Mole, Dryness, Hives, Jaundice, New Lesions, Non-Healing Wounds, Rash and Ulcer. HEENT Present- Ringing in the Ears and Wears glasses/contact lenses. Not Present- Earache, Hearing Loss, Hoarseness, Nose Bleed, Oral Ulcers, Seasonal Allergies, Sinus Pain, Sore Throat, Visual Disturbances and Yellow Eyes. Cardiovascular Present- Palpitations. Not Present- Chest Pain, Difficulty Breathing Lying Down, Leg Cramps, Rapid  Heart Rate, Shortness of  Breath and Swelling of Extremities. Gastrointestinal Present- Hemorrhoids and Rectal Pain. Not Present- Abdominal Pain, Bloating, Bloody Stool, Change in Bowel Habits, Chronic diarrhea, Constipation, Difficulty Swallowing, Excessive gas, Gets full quickly at meals, Indigestion, Nausea and Vomiting. Neurological Present- Tingling. Not Present- Decreased Memory, Fainting, Headaches, Numbness, Seizures, Tremor, Trouble walking and Weakness.  Vitals (Chanel Nolan CMA; 02/10/2020 2:40 PM) 02/10/2020 2:40 PM Weight: 146.13 lb Height: 62in Body Surface Area: 1.67 m Body Mass Index: 26.73 kg/m  Temp.: 98.71F  Pulse: 86 (Regular)  BP: 124/72 (Sitting, Left Arm, Standard)       Physical Exam [name] MD; 02/10/2020 5:44 PM) General Mental Status-Alert. General Appearance-Not in acute distress, Not Sickly. Orientation-Oriented X3. Hydration-Well hydrated. Voice-Normal.  Integumentary Global Assessment Upon inspection and palpation of skin surfaces of the - Axillae: non-tender, no inflammation or ulceration, no drainage. and Distribution of scalp and body hair is normal. General Characteristics Temperature - normal warmth is noted.  Head and Neck Head-normocephalic, atraumatic with no lesions or palpable masses. Face Global Assessment - atraumatic, no absence of expression. Neck Global Assessment - no abnormal movements, no bruit auscultated on the right, no bruit auscultated on the left, no decreased range of motion, non-tender. Trachea-midline. Thyroid Gland Characteristics - non-tender.  Eye Eyeball - Left-Extraocular movements intact, No Nystagmus - Left. Eyeball - Right-Extraocular movements intact, No Nystagmus - Right. Cornea - Left-No Hazy - Left. Cornea - Right-No Hazy - Right. Sclera/Conjunctiva - Left-No scleral icterus, No Discharge - Left. Sclera/Conjunctiva - Right-No scleral icterus, No Discharge -  Right. Pupil - Left-Direct reaction to light normal. Pupil - Right-Direct reaction to light normal.  ENMT Ears Pinna - Left - no drainage observed, no generalized tenderness observed. Pinna - Right - no drainage observed, no generalized tenderness observed. Nose and Sinuses External Inspection of the Nose - no destructive lesion observed. Inspection of the nares - Left - quiet respiration. Inspection of the nares - Right - quiet respiration. Mouth and Throat Lips - Upper Lip - no fissures observed, no pallor noted. Lower Lip - no fissures observed, no pallor noted. Nasopharynx - no discharge present. Oral Cavity/Oropharynx - Tongue - no dryness observed. Oral Mucosa - no cyanosis observed. Hypopharynx - no evidence of airway distress observed.  Chest and Lung Exam Inspection Movements - Normal and Symmetrical. Accessory muscles - No use of accessory muscles in breathing. Palpation Palpation of the chest reveals - Non-tender. Auscultation Breath sounds - Normal and Clear.  Cardiovascular Auscultation Rhythm - Regular. Murmurs & Other Heart Sounds - Auscultation of the heart reveals - No Murmurs and No Systolic Clicks.  Abdomen Inspection Inspection of the abdomen reveals - No Visible peristalsis and No Abnormal pulsations. Umbilicus - No Bleeding, No Urine drainage. Palpation/Percussion Palpation and Percussion of the abdomen reveal - Soft, Non Tender, No Rebound tenderness, No Rigidity (guarding) and No Cutaneous hyperesthesia. Note: Abdomen soft. Not severely distended. No diastasis recti. No umbilical or other anterior abdominal wall hernias right lower quadrant oblique scar.   Female Genitourinary Sexual Maturity Tanner 5 - Adult hair pattern. Note: No vaginal bleeding nor discharge   Rectal Note: Large right posterior grade 4 prolapsed hemorrhoid with edema. Friable but not actively bleeding. Left lateral usually easily prolapses out. Right anterior grade 2 with  some irritation but no prolapse.  Perianal skin with chronic clear moisture consistent with topical ointment. Good hygiene. No pruritis ani. No pilonidal disease. No fissure. No abscess/fistula. Mildly decreased but intact sphincter tone. No condyloma warts.  Tolerates  digital and anoscopic rectal exam. No other obvious rectal masses. Exam done with assistance of female Medical Assistant in the room.   Peripheral Vascular Upper Extremity Inspection - Left - No Cyanotic nailbeds - Left, Not Ischemic. Inspection - Right - No Cyanotic nailbeds - Right, Not Ischemic.  Neurologic Neurologic evaluation reveals -normal attention span and ability to concentrate, able to name objects and repeat phrases. Appropriate fund of knowledge , normal sensation and normal coordination. Mental Status Affect - not angry, not paranoid. Cranial Nerves-Normal Bilaterally. Gait-Normal.  Neuropsychiatric Mental status exam performed with findings of-able to articulate well with normal speech/language, rate, volume and coherence, thought content normal with ability to perform basic computations and apply abstract reasoning and no evidence of hallucinations, delusions, obsessions or homicidal/suicidal ideation.  Musculoskeletal Global Assessment Spine, Ribs and Pelvis - no instability, subluxation or laxity. Right Upper Extremity - no instability, subluxation or laxity.  Lymphatic Head & Neck  General Head & Neck Lymphatics: Bilateral - Description - No Localized lymphadenopathy. Axillary  General Axillary Region: Bilateral - Description - No Localized lymphadenopathy. Femoral & Inguinal  Generalized Femoral & Inguinal Lymphatics: Left - Description - No Localized lymphadenopathy. Right - Description - No Localized lymphadenopathy.   Results [name] MD; 02/10/2020 5:44 PM) Procedures  Name Value Date Hemorrhoids Procedure Anal exam: External Hemorrhoid Internal  exam: Internal Hemorroids ( non-bleeding) prolapse Other: ....Marland KitchenMarland KitchenLarge right posterior grade 4 prolapsed hemorrhoid with edema. Friable but not actively bleeding. Left lateral usually easily prolapses out. Right anterior grade 2 with some irritation but no prolapse............Marland KitchenPerianal skin with chronic clear moisture consistent with topical ointment. Good hygiene. No pruritis ani. No pilonidal disease. No fissure. No abscess/fistula. Mildly decreased but intact sphincter tone. ....Marland KitchenMarland KitchenNo condyloma warts............Marland KitchenTolerates digital and anoscopic rectal exam. No other obvious rectal masses. Exam done with assistance of female Medical Assistant in the room.  Performed: 02/10/2020 3:22 PM    Assessment & Plan [name] MD; 02/10/2020 5:41 PM) PROLAPSED INTERNAL HEMORRHOIDS, GRADE 4 (K64.3) Impression: Large right posterior chronically prolapsed Grade 4 hemorrhoid with easily prolapsing Grade 3 left lateral. History of prolapse requiring manual reduction in the past at mildlest point.  I think the hemorrhoids are too large for banding to be of much long-term help. Most data shows that grade 3 and 4 hemorrhoids refractory to multiple landings. Require ligation/pexing and hemorrhoidectomy.  She was disappointed and was hoping that I could try and do banding today. My concern is that they are too large for that to be effective. He also seemed rather sensitive and edematous, making the likelihood of tolerance to banding very unlikely. Also, the bands will be unlikely to stay on in such chronically thickened and inflamed piles. At least the bands we use are latex free though (a concern refers).  I again recommended surgery. She tells me she cannot tolerate any narcotics or nonsteroidals. Just Tylenol. I cautioned that was not realistic postop. I was trying to explain that we can try antinausea medicines with a different narcotic in the hopes of making postoperative pain  tolerable. She was hoping for an easy & pain-free option. I cautioned that the anatomy is too far along to guarantee that option  She wishes to go back to Dr. Loletha Carrow to consider colonoscopy. I think she is hoping he can try banding. If that is not effective (as I suspect) I offered to be available for her to reach back to [place] to reconsider surgery. Current Plans You have grade 3 and 4 prolapsing  and prolapsed hemorrhoids. They're too large for banding to be effective. I would recommend outpatient surgery for hemorrhoidal ligation and pexy. Hemorrhoidectomy of remaining tissue.   [name], MD, FACS, MASCRS Gastrointestinal and Minimally Invasive Surgery  [facility] [address] [place], [place] [address] [phone] Main / Paging [phone] Fax    Addendum - decided to proceed w hemorrhoidectomy  [name], MD, FACS, MASCRS Gastrointestinal and Minimally Invasive Surgery  [facility] [address] [place], [place] [address] [phone] Main / Paging [phone] Fax

## 2020-04-02 NOTE — Op Note (Signed)
04/02/2020  9:12 AM  PATIENT:  Natalie Erickson  69 y.o. female  Patient Care Team: [name], MD as PCP - General (Family Medicine) [name], MD as PCP - Cardiology (Cardiology) Loletha Carrow Kirke Corin, MD as Consulting Physician (Gastroenterology) Murrell Redden Earlyne Iba, MD as Consulting Physician (Obstetrics and Gynecology) Hortencia Pilar, MD as Consulting Physician (Surgery) Lyndee Hensen, PT as Physical Therapist (Physical Therapy) Kennith Center, RD as Dietitian (Family Medicine) Briscoe Deutscher, DO (Family Medicine) [name], MD as Consulting Physician (General Surgery)  PRE-OPERATIVE DIAGNOSIS:  HEMORRHOIDS, PROLAPSED GRADE 4  POST-OPERATIVE DIAGNOSIS:  HEMORRHOIDS, PROLAPSED GRADE 4  PROCEDURE:   Internal and external hemorrhoidectomy  x3 Internal hemorrhoidal ligation and pexy Anorectal examination under anesthesia  SURGEON:  [name], MD  ANESTHESIA:   General Anorectal & Local field block (0.25% bupivacaine with epinephrine mixed with Liposomal bupivacaine (Experel)   EBL:  Total I/O In: 108.3 [IV Piggyback:108.3] Out: 25 [Blood:25].  See operative record  Delay start of Pharmacological VTE agent (>24hrs) due to surgical blood loss or risk of bleeding:  NO  DRAINS: NONE  SPECIMEN:   Internal & external hemorrhoidx3  DISPOSITION OF SPECIMEN:  PATHOLOGY  COUNTS:  YES  PLAN OF CARE: Discharge home after PACU  PATIENT DISPOSITION:  PACU - hemodynamically stable.  INDICATION: Pleasant patient with struggles with hemorrhoids.  Not able to be managed in the office despite an improved bowel regimen.  I recommended examination under anesthesia and surgical treatment:  The anatomy & physiology of the anorectal region was discussed.  The pathophysiology of hemorrhoids and differential diagnosis was discussed.  Natural history risks without surgery was discussed.   I stressed the importance of a bowel regimen to have daily soft bowel  movements to minimize progression of disease.  Interventions such as sclerotherapy & banding were discussed.  The patient's symptoms are not adequately controlled by medicines and other non-operative treatments.  I feel the risks & problems of no surgery outweigh the operative risks; therefore, I recommended surgery to treat the hemorrhoids by ligation, pexy, and possible resection.  Risks such as bleeding, infection, need for further treatment, heart attack, death, and other risks were discussed.   I noted a good likelihood this will help address the problem.  Goals of post-operative recovery were discussed as well.  Possibility that this will not correct all symptoms was explained.  Post-operative pain, bleeding, constipation, urinary difficulties, and other problems after surgery were discussed.  We will work to minimize complications.   Educational handouts further explaining the pathology, treatment options, and bowel regimen were given as well.  Questions were answered.  The patient expresses understanding & wishes to proceed with surgery.  OR FINDINGS: Grade 4 right posterior and left lateral prolapsed hemorrhoids.  Grade 3 right anterior.  External hemorrhoidal tags.  No fissure, fistula, abscess, Intact sphincter tone.  Redundant rectum but no true circumferential prolapse.  DESCRIPTION:   Informed consent was confirmed. Patient underwent general anesthesia without difficulty. Patient was placed into prone positioning.  The perianal region was prepped and draped in sterile fashion. Surgical time-out confirmed our plan.  I did digital rectal examination and then transitioned over to anoscopy to get a sense of the anatomy.  Findings noted above.   I proceeded to do hemorrhoidal ligation and pexy in a hexagonal pattern.  I used a 2-0 Vicryl suture on a UR-6 needle in a figure-of-eight fashion 6 cm proximal to the anal verge.  I started at  the largest hemorrhoid pile.  Because of redundant  hemorrhoidal tissue too bulky to merely ligate or pexy, I excised the excess internal hemorrhoid piles longitudinally in a fusiform biconcave fashion, at the  right anterior, right posterior and left lateral locations, sparing the anal canal to avoid narrowing.  I then ran that stitch longitudinally more distally to close the hemorrhoidectomy wound to the anal verge over a Parks self retaining retractor & occasionally a large Hill-Furgeson retractor to avoid narrowing of the anal canal.  I then tied that stitch down to cause a hemorrhoidopexy.   I then did hemorrhoidal ligation and pexy at the other 4 columns.  At the completion of this, all 6 anorectal columns were ligated and pexied in the classic hexagonal fashion (right anterior/lateral/posterior, left anterior/lateral/posterior).   I trimmed and closed the external part of the hemorrhoidectomy wounds with interrupted horizontal mattress 2-0 chromic suture radially, leaving the last 5 mm open to allow natural drainage.    I redid anoscopy & examinaion.  At completion of this, all hemorrhoids had been removed or reduced into the rectum.  There is no more prolapse.  Internal & external anatomy was more more normal.  Hemostasis was good.  Fluffed gauze was on-laid over the perianal region.  No packing done.  Patient is being extubated go to go to the recovery room.  I had discussed postop care in detail with the patient in the preop holding area.  Instructions for post-operative recovery and prescriptions are written.  Patient hesitant to take narcotics since she claims many allergies.  I strongly encouraged her to be aggressive, pain control and sent prescriptions.  Gave her an opportunity to answer questions and discussed postop instructions.  I made an attempt to locate family to discuss patient's status and recommendations.  No one is available at this time.   [name], M.D., F.A.C.S. [facility] [address], [place], [place] [address] [phone] Main / Paging

## 2020-04-02 NOTE — Discharge Instructions (Signed)
Information for Discharge Teaching: EXPAREL (bupivacaine liposome injectable suspension)   Your surgeon gave you EXPAREL(bupivacaine) in your surgical incision to help control your pain after surgery.   EXPAREL is a local anesthetic that provides pain relief by numbing the tissue around the surgical site.  EXPAREL is designed to release pain medication over time and can control pain for up to 72 hours.  Depending on how you respond to EXPAREL, you may require less pain medication during your recovery.  Possible side effects:  Temporary loss of sensation or ability to move in the area where bupivacaine was injected.  Nausea, vomiting, constipation  Rarely, numbness and tingling in your mouth or lips, lightheadedness, or anxiety may occur.  Call your doctor right away if you think you may be experiencing any of these sensations, or if you have other questions regarding possible side effects.  Follow all other discharge instructions given to you by your surgeon or nurse. Eat a healthy diet and drink plenty of water or other fluids.  If you return to the hospital for any reason within 96 hours following the administration of EXPAREL, please inform your health care providers. Post Anesthesia Home Care Instructions  Activity: Get plenty of rest for the remainder of the day. A responsible adult should stay with you for 24 hours following the procedure.  For the next 24 hours, DO NOT: -Drive a car -Paediatric nurse -Drink alcoholic beverages -Take any medication unless instructed by your physician -Make any legal decisions or sign important papers.  Meals: Start with liquid foods such as gelatin or soup. Progress to regular foods as tolerated. Avoid greasy, spicy, heavy foods. If nausea and/or vomiting occur, drink only clear liquids until the nausea and/or vomiting subsides. Call your physician if vomiting continues.  Special Instructions/Symptoms: Your throat may feel dry or sore  from the anesthesia or the breathing tube placed in your throat during surgery. If this causes discomfort, gargle with warm salt water. The discomfort should disappear within 24 hours.  If you had a scopolamine patch placed behind your ear for the management of post- operative nausea and/or vomiting:  1. The medication in the patch is effective for 72 hours, after which it should be removed.  Wrap patch in a tissue and discard in the trash. Wash hands thoroughly with soap and water. 2. You may remove the patch earlier than 72 hours if you experience unpleasant side effects which may include dry mouth, dizziness or visual disturbances. 3. Avoid touching the patch. Wash your hands with soap and water after contact with the patch.     ANORECTAL SURGERY:  POST OPERATIVE INSTRUCTIONS  ######################################################################  EAT Start with a pureed / full liquid diet After 24 hours, gradually transition to a high fiber diet.    CONTROL PAIN Control pain so you can tolerate bowel movements,  walk, sleep, tolerate sneezing/coughing, and go up/down stairs.   HAVE A BOWEL MOVEMENT DAILY Keep your bowels regular to avoid problems.   Taking a fiber supplement every day to keep bowels soft.   Try a laxative to override constipation. Use an antidairrheal to slow down diarrhea.   Call if not better after 2 tries  WALK Walk an hour a day.  Control your pain to do that.   CALL IF YOU HAVE PROBLEMS/CONCERNS Call if you are still struggling despite following these instructions. Call if you have concerns not answered by these instructions  ######################################################################    1. Take your usually prescribed home medications unless otherwise  directed.  2. DIET: Follow a light bland diet & liquids the first 24 hours after arrival home, such as soup, liquids, starches, etc.  Be sure to drink plenty of fluids.  Quickly advance  to a usual solid diet within a few days.  Avoid fast food or heavy meals as your are more likely to get nauseated or have irregular bowels.  A low-fat, high-fiber diet for the rest of your life is ideal.  3. PAIN CONTROL: a. Pain is best controlled by a usual combination of three different methods TOGETHER: i. Ice/Heat ii. Over the counter pain medication iii. Prescription pain medication b. Expect swelling and discomfort in the anus/rectal area.  Warm water baths (30-60 minutes up to 6 times a day, especially after bowel meovements) will help. Use ice for the first few days to help decrease swelling and bruising, then switch to heat such as warm towels, sitz baths, warm baths, etc to help relax tight/sore spots and speed recovery.  Some people prefer to use ice alone, heat alone, alternating between ice & heat.  Experiment to what works for you.   c. It is helpful to take an over-the-counter pain medication continuously for the first few weeks.  Choose one of the following that works best for you: i. Naproxen (Aleve, etc)  Two 220mg  tabs twice a day ii. Ibuprofen (Advil, etc) Three 200mg  tabs four times a day (every meal & bedtime) iii. Acetaminophen (Tylenol, etc) 500-650mg  four times a day (every meal & bedtime) d. A  prescription for pain medication (such as oxycodone, hydrocodone, etc) should be given to you upon discharge.  Take your pain medication as prescribed.  i. If you are having problems/concerns with the prescription medicine (does not control pain, nausea, vomiting, rash, itching, etc), please call us [phone] to see if we need to switch you to a different pain medicine that will work better for you and/or control your side effect better. ii. If you need a refill on your pain medication, please contact your pharmacy.  They will contact our office to request authorization. Prescriptions will not be filled after 5 pm or on week-ends.  If can take up to 48 hours for it to be filled &  ready so avoid waiting until you are down to thel ast pill. e. A topical cream (Dibucaine) or a prescription for a cream (such as diltiazem 2% gel) may be given to you.  Many people find relief with topical creams.  Some people find it burns too much.  Experiment.  If it helps, use it.  If it burns, don't using it.  Use a Sitz Bath 4-8 times a day for relief   [employer] A sitz bath is a warm water bath taken in the sitting position that covers only the hips and buttocks. It may be used for either healing or hygiene purposes. Sitz baths are also used to relieve pain, itching, or muscle spasms. The water may contain medicine. Moist heat will help you heal and relax.  HOME CARE INSTRUCTIONS  Take 3 to 4 sitz baths a day. 1. Fill the bathtub half full with warm water. 2. Sit in the water and open the drain a little. 3. Turn on the warm water to keep the tub half full. Keep the water running constantly. 4. Soak in the water for 15 to 20 minutes. 5. After the sitz bath, pat the affected area dry first.   4. KEEP YOUR BOWELS REGULAR a. The goal is one  soft bowel movement a day b. Avoid getting constipated.  Between the surgery and the pain medications, it is common to experience some constipation.  Increasing fluid intake and taking a fiber supplement (such as Metamucil, Citrucel, FiberCon, MiraLax, etc) 2-3 times a day regularly will usually help prevent this problem from occurring.  A mild laxative (prune juice, Milk of Magnesia, MiraLax, etc) should be taken according to package directions if there are no bowel movements after 48 hours. c. Watch out for diarrhea.  If you have many loose bowel movements, simplify your diet to bland foods & liquids for a few days.  Stop any stool softeners and decrease your fiber supplement.  Switching to mild anti-diarrheal medications (Kayopectate, Pepto Bismol) can help.  Can try an imodium/loperamide dose.  If this worsens or does not improve, please call  us.  5. Wound Care  a. Remove your bandages with your first bowel movement, usually the day after surgery.  You may have packing if you had an abscess.  Let any packing or gauze fall come out.   b. Wear an absorbent pad or soft cotton balls in your underwear as needed to catch any drainage and help keep the area  c. Keep the area clean and dry.  Bathe / shower every day.  Keep the area clean by showering / bathing over the incision / wound.   It is okay to soak an open wound to help wash it.  Consider using a squeeze bottle filled with warm water to gently wash the anal area.  Wet wipes or showers / gentle washing after bowel movements is often less traumatic than regular toilet paper. d. Dennis Bast will often notice bleeding with bowel movements.  This should slow down by the end of the first week of surgery.  Sitting on an ice pack can help. e. Expect some drainage.  This should slow down by the end of the first week of surgery, but you will have occasional bleeding or drainage up to a few months after surgery.  Wear an absorbent pad or soft cotton gauze in your underwear until the drainage stops.  6. ACTIVITIES as tolerated:   a. You may resume regular (light) daily activities beginning the next day--such as daily self-care, walking, climbing stairs--gradually increasing activities as tolerated.  If you can walk 30 minutes without difficulty, it is safe to try more intense activity such as jogging, treadmill, bicycling, low-impact aerobics, swimming, etc. b. Save the most intensive and strenuous activity for last such as sit-ups, heavy lifting, contact sports, etc  Refrain from any heavy lifting or straining until you are off narcotics for pain control.   c. DO NOT PUSH THROUGH PAIN.  Let pain be your guide: If it hurts to do something, don't do it.  Pain is your body warning you to avoid that activity for another week until the pain goes down. d. You may drive when you are no longer taking prescription  pain medication, you can comfortably sit for long periods of time, and you can safely maneuver your car and apply brakes. e. Dennis Bast may have sexual intercourse when it is comfortable.  7. FOLLOW UP in our office a. Please call [facility] at [phone] to set up an appointment to see your surgeon in the office for a follow-up appointment approximately 2-3 weeks after your surgery. b. Make sure that you call for this appointment the day you arrive home to ensure a convenient appointment time.  8. IF YOU HAVE DISABILITY  OR FAMILY LEAVE FORMS, BRING THEM TO THE OFFICE FOR PROCESSING.  DO NOT GIVE THEM TO YOUR DOCTOR.        WHEN TO CALL us [phone]: 1. Poor pain control 2. Reactions / problems with new medications (rash/itching, nausea, etc)  3. Fever over 101.5 F (38.5 C) 4. Inability to urinate 5. Nausea and/or vomiting 6. Worsening swelling or bruising 7. Continued bleeding from incision. 8. Increased pain, redness, or drainage from the incision  The clinic staff is available to answer your questions during regular business hours (8:30am-5pm).  Please don't hesitate to call and ask to speak to one of our nurses for clinical concerns.   A surgeon from [facility] is always on call at the hospitals   If you have a medical emergency, go to the nearest emergency room or call 911.    [facility], [place], [place], [place], [place]  [address] ? MAIN: [phone] ? TOLL FREE: [phone] ? FAX [phone] [email or web address]   HEMORRHOIDS   Hemorrhoidal piles are natural clusters of blood vessels that help the rectum and anal canal stretch to hold stool and allow bowel movements.  Most people will develop a flare of hemorrhoids in their lifetime.  When hemorrhoidals are irritated, they can swell, burn, itch, cause pain, and bleed.  Most flares will calm down gradually within a few weeks.  However, once hemorrhoids are created,  they tend to flare more easily.  Fortunately, good habits and simple medical treatment usually control hemorrhoids well, and surgery is needed only in severe cases.  TREATMENT OF HEMORRHOID FLARE Warm soaks. 4-8 times a day This helps more than any topical medication.   2. A sitz bath is a warm water bath taken in the sitting position that covers only the hips and buttocks.Fill the bathtub half full with warm water. 3. Soak in the water for 15 to 30 minutes. 4. After the sitz bath, pat the affected area dry first.  Normalize your bowels.  Extremes of diarrhea or constipation will make hemorrhoids worse.  One soft bowel movement a day is the goal.   Wet wipes instead of toilet paper Pain control with a NSAID such as ibuprofen (Advil) or naproxen (Aleve) or acetaminophen (Tylenol) around the clock.  Narcotics are constipating and should be minimized if possible Topical creams contain steroids (bydrocortisone) or local anesthetic (xylocaine) can help make pain and itching more tolerable.    TROUBLESHOOTING IRREGULAR BOWELS 1) Avoid extremes of bowel movements (no bad constipation/diarrhea) 2) Miralax 17gm in 8oz. water or juice every day. May use twice a day.  3) Gas-x or Phazyme as needed for gas & bloating.  4) Soft & bland diet. No spicy, greasy, or fried foods.  5) Omeprazole over-the-counter as needed  6) May hold gluten/wheat products from diet to see if symptoms improve.  7)  May try probiotics (Align, Activa, etc) to help calm the bowels down 7) If symptoms become worse: Call back immediately.

## 2020-04-02 NOTE — Anesthesia Procedure Notes (Signed)
Procedure Name: Intubation Date/Time: 04/02/2020 8:49 AM Performed by: Bonney Aid, CRNA Pre-anesthesia Checklist: Patient identified, Emergency Drugs available, Suction available and Patient being monitored Patient Re-evaluated:Patient Re-evaluated prior to induction Oxygen Delivery Method: Circle system utilized Preoxygenation: Pre-oxygenation with 100% oxygen Induction Type: IV induction Ventilation: Mask ventilation without difficulty Laryngoscope Size: Mac and 3 Grade View: Grade III Tube type: Oral Number of attempts: 2 Airway Equipment and Method: Stylet Placement Confirmation: ETT inserted through vocal cords under direct vision,  positive ETCO2 and breath sounds checked- equal and bilateral Secured at: 20 cm Tube secured with: Tape Dental Injury: Teeth and Oropharynx as per pre-operative assessment  Difficulty Due To: Difficulty was anticipated and Difficult Airway- due to limited oral opening Comments: First DL with poor view,no attempt to intubate. Intubated per Dr Conrad [place] with mac3, limited view.  Atraumatic intubation

## 2020-04-02 NOTE — Anesthesia Preprocedure Evaluation (Addendum)
Anesthesia Evaluation  Patient identified by MRN, date of birth, ID band Patient awake    Reviewed: Allergy & Precautions, NPO status , Patient's Chart, lab work & pertinent test results  Airway Mallampati: I  TM Distance: >3 FB Neck ROM: Full    Dental   Pulmonary sleep apnea ,    Pulmonary exam normal        Cardiovascular METS: Normal cardiovascular exam     Neuro/Psych Anxiety Depression    GI/Hepatic   Endo/Other    Renal/GU      Musculoskeletal   Abdominal   Peds  Hematology   Anesthesia Other Findings   Reproductive/Obstetrics                            Anesthesia Physical Anesthesia Plan  ASA: II  Anesthesia Plan: General   Post-op Pain Management:    Induction: Intravenous  PONV Risk Score and Plan: 3 and Midazolam, Treatment may vary due to age or medical condition, Scopolamine patch - Pre-op and Dexamethasone  Airway Management Planned: LMA  Additional Equipment:   Intra-op Plan:   Post-operative Plan: Extubation in OR  Informed Consent: I have reviewed the patients History and Physical, chart, labs and discussed the procedure including the risks, benefits and alternatives for the proposed anesthesia with the patient or authorized representative who has indicated his/her understanding and acceptance.       Plan Discussed with: CRNA and Surgeon  Anesthesia Plan Comments:        Anesthesia Quick Evaluation

## 2020-04-02 NOTE — Anesthesia Postprocedure Evaluation (Signed)
Anesthesia Post Note  Patient: Taide Tafur  Procedure(s) Performed: ANORECTAL EXAM UNDER ANESTHESIA WITH HEMORRHOIDECTOMY, HEMORRHOIDAL LIGATION (N/A Rectum)     Patient location during evaluation: PACU Anesthesia Type: General Level of consciousness: awake and alert Pain management: pain level controlled Vital Signs Assessment: post-procedure vital signs reviewed and stable Respiratory status: spontaneous breathing, nonlabored ventilation, respiratory function stable and patient connected to nasal cannula oxygen Cardiovascular status: blood pressure returned to baseline and stable Postop Assessment: no apparent nausea or vomiting Anesthetic complications: no    Last Vitals:  Vitals:   04/02/20 1100 04/02/20 1330  BP: 131/69 (!) 130/57  Pulse: 69 70  Resp: 13 14  Temp:  (!) 36.4 C  SpO2: 99% 100%    Last Pain:  Vitals:   04/02/20 1330  TempSrc:   PainSc: 0-No pain                 OSSEY,KEVIN DAVID

## 2020-04-02 NOTE — Transfer of Care (Signed)
Immediate Anesthesia Transfer of Care Note  Patient: Natalie Erickson  Procedure(s) Performed: ANORECTAL EXAM UNDER ANESTHESIA WITH HEMORRHOIDECTOMY, HEMORRHOIDAL LIGATION (N/A Rectum)  Patient Location: PACU  Anesthesia Type:General  Level of Consciousness: sedated  Airway & Oxygen Therapy: Patient Spontanous Breathing and Patient connected to nasal cannula oxygen  Post-op Assessment: Report given to RN  Post vital signs: Reviewed and stable  Last Vitals:  Vitals Value Taken Time  BP 116/61 04/02/20 0916  Temp    Pulse 76 04/02/20 0918  Resp 16 04/02/20 0918  SpO2 100 % 04/02/20 0918  Vitals shown include unvalidated device data.  Last Pain:  Vitals:   04/02/20 0615  TempSrc: Oral  PainSc: 4       Patients Stated Pain Goal: 5 ([id number])  Complications: No apparent anesthesia complications

## 2020-04-02 NOTE — Interval H&P Note (Signed)
History and Physical Interval Note:  04/02/2020 7:15 AM  Natalie Erickson  has presented today for surgery, with the diagnosis of HEMORRHOIDS, PROLAPSED GRADE 4.  The various methods of treatment have been discussed with the patient and family. After consideration of risks, benefits and other options for treatment, the patient has consented to  Procedure(s) with comments: ANORECTAL EXAM UNDER ANESTHESIA WITH HEMORRHOIDECTOMY, HEMORRHOIDAL LIGATION (N/A) - GENERAL AND LOCAL as a surgical intervention.  The patient's history has been reviewed, patient examined, no change in status, stable for surgery.  I have reviewed the patient's chart and labs.  Questions were answered to the patient's satisfaction.     Natalie Erickson  I have re-reviewed the the patient's records, history, medications, and allergies.  I have re-examined the patient.  I again discussed intraoperative plans and goals of post-operative recovery.  The patient agrees to proceed.  Natalie Erickson  [birth year] [id number]  Patient Care Team: [name], MD as PCP - General (Family Medicine) [name], MD as PCP - Cardiology (Cardiology) Loletha Carrow Kirke Corin, MD as Consulting Physician (Gastroenterology) Murrell Redden Earlyne Iba, MD as Consulting Physician (Obstetrics and Gynecology) Hortencia Pilar, MD as Consulting Physician (Surgery) Lyndee Hensen, PT as Physical Therapist (Physical Therapy) Kennith Center, RD as Dietitian (Family Medicine) Briscoe Deutscher, DO (Family Medicine) [name], MD as Consulting Physician (General Surgery)  Patient Active Problem List   Diagnosis Date Noted   Personal history of allergy to multiple drugs 02/11/2020   Hemorrhoids 01/22/2020   DDD (degenerative disc disease), cervical 01/08/2020   Dizzinesses 01/08/2020   Bilateral occipital neuralgia 01/08/2020   Abnormal imaging of thyroid 08/04/2019   Cervicalgia 07/12/2019   Sleep disorder breathing 07/12/2019   Anxiety about  health 07/12/2019   Somatization disorder 11/14/2018   Change in stool 11/14/2018   Posterior vitreous detachment of right eye 09/10/2018   History of IBS 01/30/2018   Urolithiasis 06/23/2017   Fibromyalgia 06/04/2017   Chronic fatigue 06/04/2017   Medication intolerance 06/04/2017   ANA positive 05/20/2017   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   Serum calcium elevated 05/20/2017   Interstitial cystitis 05/20/2017   Dyspepsia 05/20/2017   Mixed hyperlipidemia 04/06/2017   Elevated BP without diagnosis of hypertension 04/06/2017   Anxiety 04/06/2017   Dry eyes 04/06/2017    Past Medical History:  Diagnosis Date   AC (acromioclavicular) joint bone spurs    c 5 and c6 and thoracic   Anxiety 04/06/2017   Benign thyroid cyst sept 2020 dx   Cystitis    Depression    Dry eyes 04/06/2017   Elevated BP without diagnosis of hypertension 04/06/2017   lost weight >> BP improved   Fibromyalgia    Floaters    both eyes   Heart murmur    hx of 2 Echocardiograms in [place] (pt was told they were normal)   Hiatal hernia    Hip pain    and groin pain for a while   History of colon polyps    History of echocardiogram    Echo 9/19: mild LVH, EF 60-65, no RWMA, Gr 1 DD, trivial MR/TR, PASP 16   History of kidney stones    History of Lyme disease    Hypercalcemia    causes kidney stones, none recent   IBS (irritable bowel syndrome)    Mixed hyperlipidemia 04/06/2017   intol to statins due to myalgias; never tried Zetia   Occipital neuralgia    Sleep apnea  mild to moderate    TMJ (dislocation of temporomandibular joint)    UTI (urinary tract infection) finishes antibiotic 03-27-2020   Vasomotor rhinitis     Past Surgical History:  Procedure Laterality Date   APPENDECTOMY  1959   left ovarin cyst removed  age 36's   UPPER GI ENDOSCOPY      Social History   Socioeconomic History   Marital status: Married    Spouse name: Not on file   Number of children: 2   Years of  education: RN   Highest education level: Not on file  Occupational History   Occupation: Retired  Tobacco Use   Smoking status: Never Smoker   Smokeless tobacco: Never Used  Substance and Sexual Activity   Alcohol use: No   Drug use: No   Sexual activity: Yes  Other Topics Concern   Not on file  Social History Narrative   ** Merged History Encounter **       Lives at home w/ her husband and family Right-handed Caffeine: none since March 2018 2 sons 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   Social Determinants of Radio broadcast assistant Strain:    Difficulty of Paying Living Expenses:   Food Insecurity:    Worried About Charity fundraiser in the Last Year:    Arboriculturist in the Last Year:   Transportation Needs:    Film/video editor (Medical):    Lack of Transportation (Non-Medical):   Physical Activity:    Days of Exercise per Week:    Minutes of Exercise per Session:   Stress:    Feeling of Stress :   Social Connections:    Frequency of Communication with Friends and Family:    Frequency of Social Gatherings with Friends and Family:    Attends Religious Services:    Active Member of Clubs or Organizations:    Attends Music therapist:    Marital Status:   Intimate Partner Violence:    Fear of Current or Ex-Partner:    Emotionally Abused:    Physically Abused:    Sexually Abused:     Family History  Problem Relation Age of Onset   Healthy Mother    Hypertension Mother    Healthy Father    Lung cancer Father    Breast cancer Sister    Diabetes Paternal Grandmother    Colon cancer Neg Hx    Stomach cancer Neg Hx    Heart attack Neg Hx    Heart failure Neg Hx    Pancreatic cancer Neg Hx    Colon polyps Neg Hx     Medications Prior to Admission  Medication Sig Dispense Refill Last Dose   acetaminophen (TYLENOL) 500 MG tablet Take 1,000 mg by mouth every 6 (six) hours as needed for headache (pain).   04/01/2020 at Unknown  time   AMBULATORY NON FORMULARY MEDICATION Take 1 tablet by mouth as needed. Medication Name: [name]   Past Week at Unknown time   Cholecalciferol (VITAMIN D) 2000 units tablet Take 2,000 Units by mouth daily.    04/01/2020 at Unknown time   gabapentin (NEURONTIN) 100 MG capsule 100 mg at bedtime as needed.    04/01/2020 at Unknown time   MAGNESIUM GLUCONATE PO Take 200 mg by mouth as needed.    Past Week at Unknown time   Polyethyl Glycol-Propyl Glycol (SYSTANE ULTRA OP) Apply to eye at bedtime.    04/01/2020  at Unknown time   polyethylene glycol powder (GLYCOLAX/MIRALAX) 17 GM/SCOOP powder Take 17-34 g by mouth daily.   04/01/2020 at Unknown time   Probiotic Product (PROBIOTIC ADVANCED PO) Take by mouth.   04/02/2020 at Unknown time   sulfamethoxazole-trimethoprim (BACTRIM DS) 800-160 MG tablet Take 1 tablet by mouth 2 (two) times daily. For uti finishes 03-27-2020   Past Week at Unknown time    Current Facility-Administered Medications  Medication Dose Route Frequency Provider Last Rate Last Admin   bupivacaine liposome (EXPAREL) 1.3 % injection 266 mg  20 mL Infiltration Once [name], MD       Chlorhexidine Gluconate Cloth 2 % PADS 6 each  6 each Topical Once [name], MD       And   Chlorhexidine Gluconate Cloth 2 % PADS 6 each  6 each Topical Once Avory Rahimi, Remo Lipps, MD       clindamycin (CLEOCIN) IVPB 900 mg  900 mg Intravenous On Call to OR [name], MD       gentamicin (GARAMYCIN) 330 mg in dextrose 5 % 100 mL IVPB  330 mg Intravenous On Call to OR [name], MD       lactated ringers infusion   Intravenous Continuous Myrtie Soman, MD 50 mL/hr at 04/02/20 0621 New Bag at 04/02/20 0621     Allergies  Allergen Reactions   Diltiazem Shortness Of Breath   Metoprolol Shortness Of Breath   Statins Tinitus    Achy joints, muscle aches Achy joints, muscle aches   Chocolate Other (See Comments)    migraine   Cocoa Other (See Comments)    migraine migraine   Codeine Nausea  And Vomiting   Cranberry Other (See Comments)    Cystitis   Latex Rash   Monosodium Glutamate Other (See Comments)    Migraine   Penicillins Rash    Has patient had a PCN reaction causing immediate rash, facial/tongue/throat swelling, SOB or lightheadedness with hypotension: Yes Has patient had a PCN reaction causing severe rash involving mucus membranes or skin necrosis: No Has patient had a PCN reaction that required hospitalization No Has patient had a PCN reaction occurring within the last 10 years: No If all of the above answers are "NO", then may proceed with Cephalosporin use.    Antihistamines, Loratadine-Type     Throat swelling   Carvedilol Other (See Comments)    N&N, SOB , Head ache and joint pain.    Eggs Or Egg-Derived Products    Ginger    Gluten Meal    Lexapro [Escitalopram]     GI    Montelukast Other (See Comments)    Throat swelling   Other Swelling    Throat swelling   Prednisone Swelling    Throat swelling (no difficulty breathing)   Sulfites Other (See Comments)    Nausea and headaches   Vanilla    Whey    Zofran [Ondansetron Hcl] Other (See Comments)    Muscle cramps, leg tingling   Azithromycin Other (See Comments) and Diarrhea    BP (!) 142/83   Pulse 92   Temp 98.5 F (36.9 C) (Oral)   Resp 15   Ht 5\' 2"  (1.575 m)   Wt 64.4 kg   SpO2 99%   BMI 25.97 kg/m   Labs: Results for orders placed or performed during the hospital encounter of 04/02/20 (from the past 48 hour(s))  I-STAT, chem 8     Status: Abnormal   Collection Time: 04/02/20  6:03  AM  Result Value Ref Range   Sodium 140 135 - 145 mmol/L   Potassium 3.8 3.5 - 5.1 mmol/L   Chloride 103 98 - 111 mmol/L   BUN 13 8 - 23 mg/dL   Creatinine, Ser 0.70 0.44 - 1.00 mg/dL   Glucose, Bld 100 (H) 70 - 99 mg/dL    Comment: Glucose reference range applies only to samples taken after fasting for at least 8 hours.   Calcium, Ion 1.28 1.15 - 1.40 mmol/L   TCO2 31 22 - 32 mmol/L    Hemoglobin 13.3 12.0 - 15.0 g/dL   HCT 39.0 36.0 - 46.0 %    Imaging / Studies: No results found.   Natalie Erickson, M.D., F.A.C.S. Gastrointestinal and Minimally Invasive Surgery [facility] [address] [place],  [address] [phone] Main / Paging  04/02/2020 7:15 AM

## 2020-04-03 LAB — SURGICAL PATHOLOGY

## 2020-04-07 ENCOUNTER — Telehealth: Payer: Self-pay | Admitting: Family Medicine

## 2020-04-07 NOTE — Telephone Encounter (Signed)
Patient is calling in asking if she can come by today to leave a urine sample, she believes she may have another UTI. Offered an appointment tomorrow but patient declined and asked if she could just drop it.

## 2020-04-07 NOTE — Telephone Encounter (Signed)
Pt called following up on message she sent this morning. Told pt Dr. [name] had not seen the message yet. Pt scheduled appt with Dr. Rogers Blocker for tomorrow 4/14 due to Dr. [name] having full schedule.

## 2020-04-07 NOTE — Telephone Encounter (Signed)
Please advise 

## 2020-04-08 ENCOUNTER — Other Ambulatory Visit: Payer: Self-pay

## 2020-04-08 ENCOUNTER — Ambulatory Visit (INDEPENDENT_AMBULATORY_CARE_PROVIDER_SITE_OTHER): Payer: PPO | Admitting: Family Medicine

## 2020-04-08 ENCOUNTER — Encounter: Payer: Self-pay | Admitting: Family Medicine

## 2020-04-08 VITALS — BP 132/80 | HR 84 | Temp 97.0°F | Ht 62.0 in | Wt 143.4 lb

## 2020-04-08 DIAGNOSIS — R350 Frequency of micturition: Secondary | ICD-10-CM | POA: Diagnosis not present

## 2020-04-08 LAB — POC URINALSYSI DIPSTICK (AUTOMATED)
Bilirubin, UA: NEGATIVE
Blood, UA: POSITIVE
Glucose, UA: NEGATIVE
Ketones, UA: NEGATIVE
Leukocytes, UA: NEGATIVE
Nitrite, UA: NEGATIVE
Protein, UA: NEGATIVE
Spec Grav, UA: 1.01 (ref 1.010–1.025)
Urobilinogen, UA: 0.2 E.U./dL
pH, UA: 6 (ref 5.0–8.0)

## 2020-04-08 NOTE — Progress Notes (Signed)
   Georgeanna Nalle is a 69 y.o. female who presents today for an office visit.  Assessment/Plan:  New/Acute Problems: Dysuria No red flags.  Will await culture results.  Recommended good oral hydration.  Will restart Bactrim if culture shows E. coli again.  Discussed reasons to return to care.     Subjective:  HPI:  Patient with dysuria for the past few days.  Recently underwent hemorrhoidectomy and is not sure if this is contributing.  She has had several episodes of loose stool since then.  Feels similar to past UTIs.  She has done a couple of sitz bath since her hemorrhoidectomy and is not sure if this contributed.  No fevers.  No abdominal pain.  No back pain.       Objective:  Physical Exam: BP 132/80   Pulse 84   Temp (!) 97 F (36.1 C)   Ht 5\' 2"  (1.575 m)   Wt 143 lb 6.1 oz (65 kg)   SpO2 97%   BMI 26.22 kg/m   Gen: No acute distress, resting comfortably       Sharlynn Seckinger M. [name], MD 04/08/2020 10:52 AM

## 2020-04-08 NOTE — Telephone Encounter (Signed)
Noted  

## 2020-04-08 NOTE — Telephone Encounter (Signed)
Will be seeing pt today.  Natalie Erickson. [name], MD 04/08/2020 9:13 AM

## 2020-04-09 LAB — URINE CULTURE
MICRO NUMBER:: 10362883
Result:: NO GROWTH
SPECIMEN QUALITY:: ADEQUATE

## 2020-04-10 NOTE — Progress Notes (Signed)
Please inform patient of the following:  Urine culture is negative for UTI. Would like for her to let us know if her symptoms have not improved.  [name]. [name], MD 04/10/2020 10:27 AM

## 2020-04-24 DIAGNOSIS — G4733 Obstructive sleep apnea (adult) (pediatric): Secondary | ICD-10-CM | POA: Diagnosis not present

## 2020-04-29 DIAGNOSIS — G4733 Obstructive sleep apnea (adult) (pediatric): Secondary | ICD-10-CM | POA: Diagnosis not present

## 2020-05-01 NOTE — Progress Notes (Signed)
Cardiology Office Note   Date:  05/04/2020   ID:  Natalie Erickson, DOB [birth year], MRN [id number]  PCP:  [name], MD  Cardiologist:  [name], MD EP: None  Chief Complaint  Patient presents with  . Follow-up    palpitations, chest tightness      History of Present Illness: Natalie Erickson is a 69 y.o. female with PMH of HLD, palpitations, anxiety, IBS, fibromyalgia, and depression who presents for routine follow-up.  She was last evaluated by cardiology via a telemedicine visit with Dr. [name] 06/20/2019, at which time she reported som chest tightness correlating with back tightness following a recent injury. She was recommended to undergo a stress test again which was previously ordered by myself earlier that month. This has still not been completed. She had echocardiogram in 2019 which showed EF 60-65%, G1DD, no RWMA, and no significant valvular abnormalities. She wore a 3 day cardiac monitor 05/2019 which showed rare PVCs/PACs which corresponded with symptoms and 2 brief episodes of SVT (longest 10 seconds at a rate of 164 bpm).  She presents today for routine follow-up of her palpitations. She has been doing well from a cardiac standpoint since her last visit. Still has some palpitations which are rare and not particularly bothersome. She is not interested in medications to manage these at this time. She feels like the palpitations have improved significantly since starting CPAP therapy for her recently diagnosed OSA. Her chest tightness has also improved - she attributes this to her fibromyalgia given improvement with massage and exercises. She is working with a neurologist to manage "inflammation of the nerves" in her head. Recently underwent a hemorrhoidectomy and is looking forward to getting back into yoga after recovering a little more. She still has occasional dizziness but no syncopal episodes. She is working on her masters degree which is keeping her busy.      Past Medical History:  Diagnosis Date  . AC (acromioclavicular) joint bone spurs    c 5 and c6 and thoracic  . Anxiety 04/06/2017  . 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 Cystitis   . Depression   . Dry eyes 04/06/2017  . Elevated BP without diagnosis of hypertension 04/06/2017   lost weight >> BP improved  . Fibromyalgia   . Floaters    both eyes  . Heart murmur    hx of 2 Echocardiograms in [place] (pt was told they were normal)  . Hiatal hernia   . Hip pain    and groin pain for a while  . History of colon polyps   . History of echocardiogram    Echo 9/19: mild LVH, EF 60-65, no RWMA, Gr 1 DD, trivial MR/TR, PASP 16  . History of kidney stones   . History of Lyme disease   . Hypercalcemia    causes kidney stones, none recent  . IBS (irritable bowel syndrome)   . Mixed hyperlipidemia 04/06/2017   intol to statins due to myalgias; never tried Zetia  . Occipital neuralgia   . Sleep apnea    mild to moderate   . TMJ (dislocation of temporomandibular joint)   . UTI (urinary tract infection) finishes antibiotic 03-27-2020  . Vasomotor rhinitis     Past Surgical History:  Procedure Laterality Date  . APPENDECTOMY  1959  . EVALUATION UNDER ANESTHESIA WITH HEMORRHOIDECTOMY N/A 04/02/2020   Procedure: ANORECTAL EXAM UNDER ANESTHESIA WITH HEMORRHOIDECTOMY, HEMORRHOIDAL LIGATION;  Surgeon: [name], MD;  Location: [facility];  Service: General;  Laterality: N/A;  GENERAL AND LOCAL  . left ovarin cyst removed  age 81's  . UPPER GI ENDOSCOPY       Current Outpatient Medications  Medication Sig Dispense Refill  . acetaminophen (TYLENOL) 500 MG tablet Take 1,000 mg by mouth every 6 (six) hours as needed for headache (pain).    . AMBULATORY NON FORMULARY MEDICATION Take 1 tablet by mouth as needed. Medication Name: [name]    . Cholecalciferol (VITAMIN D) 2000 units tablet Take 2,000 Units by mouth daily.     Marland Kitchen gabapentin (NEURONTIN) 100 MG capsule  100 mg at bedtime as needed.     Marland Kitchen ibuprofen (ADVIL) 800 MG tablet Take 1 tablet (800 mg total) by mouth every 8 (eight) hours as needed. 40 tablet 2  . Magnesium Glycinate POWD 120 mg by Does not apply route.    [name] (SYSTANE ULTRA OP) Apply to eye at bedtime.     . polyethylene glycol powder (GLYCOLAX/MIRALAX) 17 GM/SCOOP powder Take 17-34 g by mouth daily.    . Probiotic Product (PROBIOTIC ADVANCED PO) Take by mouth.     No current facility-administered medications for this visit.    Allergies:   Diltiazem; Metoprolol; Statins; Chocolate; Cocoa; Codeine; Cranberry; Latex; Monosodium glutamate; Penicillins; Antihistamines, loratadine-type; Carvedilol; Eggs or egg-derived products; Ginger; Gluten meal; Lexapro [escitalopram]; Montelukast; Other; Prednisone; Sulfites; Vanilla; Whey; Zofran [ondansetron hcl]; and Azithromycin    Social History:  The patient  reports that she has never smoked. She has never used smokeless tobacco. She reports that she does not drink alcohol or use drugs.   Family History:  The patient's family history includes Breast cancer in her sister; Diabetes in her paternal grandmother; Healthy in her father and mother; Hypertension in her mother; Lung cancer in her father.    ROS:  Please see the history of present illness.   Otherwise, review of systems are positive for none.   All other systems are reviewed and negative.    PHYSICAL EXAM: VS:  BP 127/73   Pulse 71   Temp (!) 97.5 F (36.4 C)   Ht 5\' 2"  (1.575 m)   Wt 143 lb 3.2 oz (65 kg)   SpO2 98%   BMI 26.19 kg/m  , BMI Body mass index is 26.19 kg/m. GEN: Well nourished, well developed, in no acute distress HEENT: sclera anicteric Neck: no JVD or carotid bruits Cardiac: RRR; no murmurs, rubs, or gallops, no edema  Respiratory:  clear to auscultation bilaterally, normal work of breathing GI: soft, nontender, nondistended, + BS MS: no deformity or atrophy Skin: warm and dry,  no rash Neuro:  Strength and sensation are intact Psych: euthymic mood, full affect   EKG:  EKG is ordered today. The ekg ordered today demonstrates sinus rhythm, rate 71 bpm, no STE/D, no TWI.   Recent Labs: 06/04/2019: ALT 10; TSH 0.64 08/28/2019: Platelets 425 04/02/2020: BUN 13; Creatinine, Ser 0.70; Hemoglobin 13.3; Potassium 3.8; Sodium 140    Lipid Panel    Component Value Date/Time   CHOL 224 (H) 06/04/2019 0950   TRIG 96.0 06/04/2019 0950   HDL 66.90 06/04/2019 0950   CHOLHDL 3 06/04/2019 0950   VLDL 19.2 06/04/2019 0950   LDLCALC 138 (H) 06/04/2019 0950      Wt Readings from Last 3 Encounters:  05/04/20 143 lb 3.2 oz (65 kg)  04/08/20 143 lb 6.1 oz (65 kg)  04/02/20 142 lb (64.4 kg)  Other studies Reviewed: Additional studies/ records that were reviewed today include:   3-day cardiac monitor 05/2019: Minimum HR (bpm): 50 Maximum HR (bpm): 164  Supraventricular Ectopy: rare <1% Ventricular Ectopy: rare <1%  SVT: 2 episodes in 3 days, longest episode lasting 10 seconds at fastest rate of 164 bpm NSVT: none Ventricular Tachycardia: none  Pauses: none AV block: none   Atrial fibrillation: none  Diary events: skipped/irregular beat associated with PVC  IMPRESSION: 2 episodes of SVT 10 seconds or less. Symptomatic, rare PVCs.   Echocardiogram 07/2018: Study Conclusions  - Left ventricle: The cavity size was normal. Wall thickness was increased in a pattern of mild LVH. Systolic function was normal. The estimated ejection fraction was in the range of 60% to 65%. Wall motion was normal; there were no regional wall motion abnormalities. Doppler parameters are consistent with abnormal left ventricular relaxation (grade 1 diastolic dysfunction). Doppler parameters are consistent with indeterminate ventricular filling pressure. - Aortic valve: Transvalvular velocity was within the normal range. There was no stenosis. There was no  regurgitation. - Mitral valve: Transvalvular velocity was within the normal range. There was no evidence for stenosis. There was trivial regurgitation. - Right ventricle: The cavity size was normal. Wall thickness was normal. Systolic function was normal. - Atrial septum: No defect or patent foramen ovale was identified by color flow Doppler. - Tricuspid valve: There was trivial regurgitation. - Pulmonic valve: Peak gradient (S): 14 mm Hg. - Pulmonary arteries: Systolic pressure was within the normal range. PA peak pressure: 16 mm Hg (S).     ASSESSMENT AND PLAN:  1. PSVT/PVCs: not particularly bothersome. Intolerant to propranolol and metoprolol. Brief episodes of NSVT and rare PVCs on cardiac monitor 05/2019. She is not interested in medications at this time. - Continue to monitor for now.   2. Chest tightness: improved with management of her fibromyalgia. She is not interested in an ischemic evaluation at this time.   - Continue to monitor for now  3. HLD: LDL 138 on labs 05/2019; she reports intolerance to statins.  - Continue dietary modifications to lower cholesterol  4. Fibromyalgia: Seems to be her primary issue at this point.  - Continue follow-up with integrative medicine/neurology/ PCP  5. Anxiety: Likely contributing to #1 and 2.  - Continue follow-up with PCP  6. OSA: started on CPAP. This has improved her palpitations. - Continue CPAP and follow-up with Neurology who is managing her OSA.     Current medicines are reviewed at length with the patient today.  The patient does not have concerns regarding medicines.  The following changes have been made:  As above  Labs/ tests ordered today include:   Orders Placed This Encounter  Procedures  . EKG 12-Lead     Disposition:   FU with Dr. [name] in 1 year  Signed, Abigail Butts, PA-C  05/04/2020 10:41 AM

## 2020-05-04 ENCOUNTER — Telehealth: Payer: Self-pay | Admitting: Family Medicine

## 2020-05-04 ENCOUNTER — Other Ambulatory Visit: Payer: Self-pay

## 2020-05-04 ENCOUNTER — Encounter: Payer: Self-pay | Admitting: Medical

## 2020-05-04 ENCOUNTER — Ambulatory Visit: Payer: PPO | Admitting: Medical

## 2020-05-04 VITALS — BP 127/73 | HR 71 | Temp 97.5°F | Ht 62.0 in | Wt 143.2 lb

## 2020-05-04 DIAGNOSIS — R002 Palpitations: Secondary | ICD-10-CM | POA: Diagnosis not present

## 2020-05-04 DIAGNOSIS — M797 Fibromyalgia: Secondary | ICD-10-CM

## 2020-05-04 DIAGNOSIS — G4733 Obstructive sleep apnea (adult) (pediatric): Secondary | ICD-10-CM | POA: Diagnosis not present

## 2020-05-04 DIAGNOSIS — F419 Anxiety disorder, unspecified: Secondary | ICD-10-CM

## 2020-05-04 DIAGNOSIS — E785 Hyperlipidemia, unspecified: Secondary | ICD-10-CM | POA: Diagnosis not present

## 2020-05-04 DIAGNOSIS — R0789 Other chest pain: Secondary | ICD-10-CM | POA: Diagnosis not present

## 2020-05-04 DIAGNOSIS — Z9989 Dependence on other enabling machines and devices: Secondary | ICD-10-CM | POA: Diagnosis not present

## 2020-05-04 NOTE — Patient Instructions (Signed)
Medication Instructions:  CONTINUE WITH CURRENT MEDICATIONS. NO CHANGES.  *If you need a refill on your cardiac medications before your next appointment, please call your pharmacy*   Follow-Up: At [facility], you and your health needs are our priority.  As part of our continuing mission to provide you with exceptional heart care, we have created designated Provider Care Teams.  These Care Teams include your primary Cardiologist (physician) and Advanced Practice Providers (APPs -  Physician Assistants and Nurse Practitioners) who all work together to provide you with the care you need, when you need it.  We recommend signing up for the patient portal called "MyChart".  Sign up information is provided on this After Visit Summary.  MyChart is used to connect with patients for Virtual Visits (Telemedicine).  Patients are able to view lab/test results, encounter notes, upcoming appointments, etc.  Non-urgent messages can be sent to your provider as well.   To learn more about what you can do with MyChart, go to [email or web address].    Your next appointment:   12 month(s)  The format for your next appointment:   In Person  Provider:   You may see [name], MD or one of the following Advanced Practice Providers on your designated Care Team:  Roby Lofts

## 2020-05-04 NOTE — Telephone Encounter (Signed)
I called the patient to schedule AWV with Loma Sousa ([place]), but there was no answer and no option to leave a message. If patient calls back, please schedule Medicare Wellness Visit (initial) at next available opening. VDM ([place])

## 2020-05-13 DIAGNOSIS — M797 Fibromyalgia: Secondary | ICD-10-CM | POA: Diagnosis not present

## 2020-05-13 DIAGNOSIS — M5030 Other cervical disc degeneration, unspecified cervical region: Secondary | ICD-10-CM | POA: Diagnosis not present

## 2020-05-13 DIAGNOSIS — M5481 Occipital neuralgia: Secondary | ICD-10-CM | POA: Diagnosis not present

## 2020-05-24 DIAGNOSIS — G4733 Obstructive sleep apnea (adult) (pediatric): Secondary | ICD-10-CM | POA: Diagnosis not present

## 2020-05-26 DIAGNOSIS — G4733 Obstructive sleep apnea (adult) (pediatric): Secondary | ICD-10-CM | POA: Diagnosis not present

## 2020-06-24 DIAGNOSIS — G4733 Obstructive sleep apnea (adult) (pediatric): Secondary | ICD-10-CM | POA: Diagnosis not present

## 2020-06-30 ENCOUNTER — Other Ambulatory Visit: Payer: Self-pay

## 2020-06-30 ENCOUNTER — Ambulatory Visit (INDEPENDENT_AMBULATORY_CARE_PROVIDER_SITE_OTHER): Payer: PPO | Admitting: Physician Assistant

## 2020-06-30 ENCOUNTER — Encounter: Payer: Self-pay | Admitting: Physician Assistant

## 2020-06-30 VITALS — BP 120/70 | HR 81 | Temp 97.9°F | Ht 62.0 in | Wt 146.5 lb

## 2020-06-30 DIAGNOSIS — R300 Dysuria: Secondary | ICD-10-CM | POA: Diagnosis not present

## 2020-06-30 LAB — POCT URINALYSIS DIPSTICK
Bilirubin, UA: NEGATIVE
Blood, UA: POSITIVE
Glucose, UA: NEGATIVE
Ketones, UA: NEGATIVE
Nitrite, UA: NEGATIVE
Protein, UA: NEGATIVE
Spec Grav, UA: 1.015 (ref 1.010–1.025)
Urobilinogen, UA: 0.2 E.U./dL
pH, UA: 6 (ref 5.0–8.0)

## 2020-06-30 MED ORDER — TRIMETHOPRIM 100 MG PO TABS: 100.0000 mg | ORAL_TABLET | Freq: Two times a day (BID) | ORAL | 0 refills | Status: AC

## 2020-06-30 NOTE — Progress Notes (Signed)
Natalie Erickson is a 69 y.o. female here for a recurrence of a previously resolved problem.  I acted as a Education administrator for [employer], PA-C Anselmo Pickler, LPN  History of Present Illness:   Chief Complaint  Patient presents with  . Dysuria    HPI    Dysuria Symptoms started on Friday. Pt c/o urgency, frequency, burning and pain. Chills off and on. No fever, back pain, nausea, vomiting. Pt has taken olive leaf OTC.  She is 3 months s/p hemorrhoidectomy. She reports that her muscle tone in her rectum continues to be weak. She states that she gets urgency with her stools and feels as though this may have caused her recent UTI symptoms.      Past Medical History:  Diagnosis Date  . AC (acromioclavicular) joint bone spurs    c 5 and c6 and thoracic  . Anxiety 04/06/2017  . 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 Cystitis   . Depression   . Dry eyes 04/06/2017  . Elevated BP without diagnosis of hypertension 04/06/2017   lost weight >> BP improved  . Fibromyalgia   . Floaters    both eyes  . Heart murmur    hx of 2 Echocardiograms in [place] (pt was told they were normal)  . Hiatal hernia   . Hip pain    and groin pain for a while  . History of colon polyps   . History of echocardiogram    Echo 9/19: mild LVH, EF 60-65, no RWMA, Gr 1 DD, trivial MR/TR, PASP 16  . History of kidney stones   . History of Lyme disease   . Hypercalcemia    causes kidney stones, none recent  . IBS (irritable bowel syndrome)   . Mixed hyperlipidemia 04/06/2017   intol to statins due to myalgias; never tried Zetia  . Occipital neuralgia   . Sleep apnea    mild to moderate   . TMJ (dislocation of temporomandibular joint)   . UTI (urinary tract infection) finishes antibiotic 03-27-2020  . Vasomotor rhinitis      Social History   Tobacco Use  . Smoking status: Never Smoker  . Smokeless tobacco: Never Used  Vaping Use  . Vaping Use: Never used  Substance Use Topics  . Alcohol use: No  .  Drug use: No    Past Surgical History:  Procedure Laterality Date  . APPENDECTOMY  1959  . EVALUATION UNDER ANESTHESIA WITH HEMORRHOIDECTOMY N/A 04/02/2020   Procedure: ANORECTAL EXAM UNDER ANESTHESIA WITH HEMORRHOIDECTOMY, HEMORRHOIDAL LIGATION;  Surgeon: [name], MD;  Location: [facility];  Service: General;  Laterality: N/A;  GENERAL AND LOCAL  . left ovarin cyst removed  age 23's  . UPPER GI ENDOSCOPY      Family History  Problem Relation Age of Onset  . Healthy Mother   . Hypertension Mother   . Healthy Father   . Lung cancer Father   . Breast cancer Sister   . Diabetes Paternal Grandmother   . Colon cancer Neg Hx   . Stomach cancer Neg Hx   . Heart attack Neg Hx   . Heart failure Neg Hx   . Pancreatic cancer Neg Hx   . Colon polyps Neg Hx     Allergies  Allergen Reactions  . Diltiazem Shortness Of Breath  . Metoprolol Shortness Of Breath  . Statins Tinitus    Achy joints, muscle aches Achy joints, muscle aches  . Chocolate Other (See Comments)  migraine  . Cocoa Other (See Comments)    migraine migraine  . Codeine Nausea And Vomiting  . Cranberry Other (See Comments)    Cystitis  . Latex Rash  . Monosodium Glutamate Other (See Comments)    Migraine  . Penicillins Rash    Has patient had a PCN reaction causing immediate rash, facial/tongue/throat swelling, SOB or lightheadedness with hypotension: Yes Has patient had a PCN reaction causing severe rash involving mucus membranes or skin necrosis: No Has patient had a PCN reaction that required hospitalization No Has patient had a PCN reaction occurring within the last 10 years: No If all of the above answers are "NO", then may proceed with Cephalosporin use.   Marland Kitchen Antihistamines, Loratadine-Type     Throat swelling  . Carvedilol Other (See Comments)    N&N, SOB , Head ache and joint pain.   . Eggs Or Egg-Derived Products   . Ginger   . Gluten Meal   . Lexapro [Escitalopram]     GI    . Montelukast Other (See Comments)    Throat swelling  . Other Swelling    Throat swelling  . Prednisone Swelling    Throat swelling (no difficulty breathing)  . Sulfites Other (See Comments)    Nausea and headaches  . Vanilla   . Whey   . Zofran [Ondansetron Hcl] Other (See Comments)    Muscle cramps, leg tingling  . Azithromycin Other (See Comments) and Diarrhea    Current Medications:   Current Outpatient Medications:  .  acetaminophen (TYLENOL) 500 MG tablet, Take 1,000 mg by mouth every 6 (six) hours as needed for headache (pain)., Disp: , Rfl:  .  AMBULATORY NON FORMULARY MEDICATION, Take 1 tablet by mouth as needed. Medication Name: [facility], Disp: , Rfl:  .  Cholecalciferol (VITAMIN D) 2000 units tablet, Take 2,000 Units by mouth daily. , Disp: , Rfl:  .  gabapentin (NEURONTIN) 100 MG capsule, 100 mg at bedtime as needed. , Disp: , Rfl:  .  Magnesium Glycinate POWD, 120 mg by Does not apply route., Disp: , Rfl:  .  Polyethyl Glycol-Propyl Glycol (SYSTANE ULTRA OP), Apply to eye at bedtime. , Disp: , Rfl:  .  polyethylene glycol powder (GLYCOLAX/MIRALAX) 17 GM/SCOOP powder, Take 17-34 g by mouth daily., Disp: , Rfl:  .  Probiotic Product (PROBIOTIC ADVANCED PO), Take by mouth., Disp: , Rfl:  .  trimethoprim (TRIMPEX) 100 MG tablet, Take 1 tablet (100 mg total) by mouth 2 (two) times daily for 7 days., Disp: 14 tablet, Rfl: 0   Review of Systems:   ROS  Negative unless otherwise specified per HPI.   Vitals:   Vitals:   06/30/20 1410  BP: 120/70  Pulse: 81  Temp: 97.9 F (36.6 C)  TempSrc: Temporal  SpO2: 98%  Weight: 146 lb 8 oz (66.5 kg)  Height: 5\' 2"  (1.575 m)     Body mass index is 26.8 kg/m.  Physical Exam:   Physical Exam Vitals and nursing note reviewed.  Constitutional:      General: She is not in acute distress.    Appearance: She is well-developed. She is not ill-appearing or toxic-appearing.  Cardiovascular:     Rate and Rhythm: Normal  rate and regular rhythm.     Pulses: Normal pulses.     Heart sounds: Normal heart sounds, S1 normal and S2 normal.     Comments: No LE edema Pulmonary:     Effort: Pulmonary effort is normal.  Breath sounds: Normal breath sounds.  Skin:    General: Skin is warm and dry.  Neurological:     Mental Status: She is alert.     GCS: GCS eye subscore is 4. GCS verbal subscore is 5. GCS motor subscore is 6.  Psychiatric:        Speech: Speech normal.        Behavior: Behavior normal. Behavior is cooperative.     Results for orders placed or performed in visit on 06/30/20  POCT urinalysis dipstick  Result Value Ref Range   Color, UA yellow    Clarity, UA clear    Glucose, UA Negative Negative   Bilirubin, UA negative    Ketones, UA negative    Spec Grav, UA 1.015 1.010 - 1.025   Blood, UA positive    pH, UA 6.0 5.0 - 8.0   Protein, UA Negative Negative   Urobilinogen, UA 0.2 0.2 or 1.0 E.U./dL   Nitrite, UA negative    Leukocytes, UA Trace (A) Negative   Appearance     Odor      Assessment and Plan:   Averie was seen today for dysuria.  Diagnoses and all orders for this visit:  Dysuria Symptoms suggest acute cystitis. Will start oral trimethoprim (she has tolerated this well without issues in the past) and recommend pushing fluids. No red flags on exam -- worsening precautions advised. Urine culture pending. -     POCT urinalysis dipstick -     Urine Culture  Other orders -     trimethoprim (TRIMPEX) 100 MG tablet; Take 1 tablet (100 mg total) by mouth 2 (two) times daily for 7 days.  . Reviewed expectations re: course of current medical issues. . Discussed self-management of symptoms. . Outlined signs and symptoms indicating need for more acute intervention. . Patient verbalized understanding and all questions were answered. . See orders for this visit as documented in the electronic medical record. . Patient received an After-Visit Summary.  CMA or LPN served as  scribe during this visit. History, Physical, and Plan performed by medical provider. The above documentation has been reviewed and is accurate and complete.  Inda Coke, PA-C

## 2020-06-30 NOTE — Patient Instructions (Signed)
It was great to see you!  Start antibiotic. I will be in touch with urine culture results.  General instructions  Make sure you: ? Pee until your bladder is empty. ? Do not hold pee for a long time. ? Empty your bladder after sex. ? Wipe from front to back after pooping if you are a female. Use each tissue one time when you wipe.  Drink enough fluid to keep your pee pale yellow.  Keep all follow-up visits as told by your doctor. This is important. Contact a doctor if:  You do not get better after 1-2 days.  Your symptoms go away and then come back. Get help right away if:  You have very bad back pain.  You have very bad pain in your lower belly.  You have a fever.  You are sick to your stomach (nauseous).  You are throwing up.   Take care,  Inda Coke PA-C

## 2020-07-01 LAB — URINE CULTURE
MICRO NUMBER:: 10670277
SPECIMEN QUALITY:: ADEQUATE

## 2020-07-08 ENCOUNTER — Telehealth: Payer: Self-pay | Admitting: Family Medicine

## 2020-07-08 NOTE — Telephone Encounter (Signed)
Please schedule patient OV open slot tomorrow

## 2020-07-08 NOTE — Telephone Encounter (Signed)
Patient called in this afternoon, states that she is still not feeling any better and would like to know if it would be possible to bring another urine sample.

## 2020-07-08 NOTE — Telephone Encounter (Signed)
Patient is scheduled virtually with Dr.Kim.

## 2020-07-08 NOTE — Telephone Encounter (Signed)
Please advise 

## 2020-07-08 NOTE — Telephone Encounter (Signed)
Please schedule with provider that will be in office tomorrow as it is too late in the day for this.

## 2020-07-09 ENCOUNTER — Encounter: Payer: Self-pay | Admitting: Family Medicine

## 2020-07-09 ENCOUNTER — Other Ambulatory Visit: Payer: Self-pay

## 2020-07-09 ENCOUNTER — Other Ambulatory Visit (INDEPENDENT_AMBULATORY_CARE_PROVIDER_SITE_OTHER): Payer: PPO

## 2020-07-09 ENCOUNTER — Telehealth (INDEPENDENT_AMBULATORY_CARE_PROVIDER_SITE_OTHER): Payer: PPO | Admitting: Family Medicine

## 2020-07-09 VITALS — BP 116/73 | HR 91 | Temp 98.0°F | Ht 62.0 in | Wt 143.5 lb

## 2020-07-09 DIAGNOSIS — R300 Dysuria: Secondary | ICD-10-CM

## 2020-07-09 DIAGNOSIS — N3010 Interstitial cystitis (chronic) without hematuria: Secondary | ICD-10-CM

## 2020-07-09 DIAGNOSIS — R102 Pelvic and perineal pain: Secondary | ICD-10-CM | POA: Diagnosis not present

## 2020-07-09 LAB — POCT URINALYSIS DIPSTICK
Bilirubin, UA: NEGATIVE
Blood, UA: POSITIVE
Glucose, UA: NEGATIVE
Ketones, UA: NEGATIVE
Leukocytes, UA: NEGATIVE
Nitrite, UA: NEGATIVE
Protein, UA: NEGATIVE
Spec Grav, UA: 1.01 (ref 1.010–1.025)
Urobilinogen, UA: 0.2 E.U./dL
pH, UA: 5.5 (ref 5.0–8.0)

## 2020-07-09 NOTE — Addendum Note (Signed)
Addended by: Zacarias Pontes on: 07/09/2020 11:22 AM   Modules accepted: Orders

## 2020-07-09 NOTE — Progress Notes (Signed)
Lab orders were already put in before I could put them in.

## 2020-07-09 NOTE — Progress Notes (Signed)
Virtual Visit via Video Note  I connected with Natalie Erickson  on 07/09/20 at 10:20 AM EDT by a video enabled telemedicine application and verified that I am speaking with the correct person using two identifiers.  Location patient: home, [place] Location provider:work or home office Persons participating in the virtual visit: patient, provider  I discussed the limitations of evaluation and management by telemedicine and the availability of in person appointments. The patient expressed understanding and agreed to proceed.   HPI:  Acute visit for Dysuria: -started 1-2 weeks ago -intermittent mild dysuria, urgency, pelvic discomfort - she had been exercising a lot more than usual too - so could be that too -has hx of IC, she is worried about an infection rather than her IC -she also has fibromyalgia -denies fevers, flank pain, NV, hematuria, vaginal discharge -had urine studies 06/30/2020 that showed contamination, PCP gave her 7 days of Trimpex -reports has improved some, but not resolved and she wants to recheck urine culture -she has a urologist, but prefers to check urine again with PCP office as is going away next week  ROS: See pertinent positives and negatives per HPI.  Past Medical History:  Diagnosis Date  . AC (acromioclavicular) joint bone spurs    c 5 and c6 and thoracic  . Anxiety 04/06/2017  . 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 Cystitis   . Depression   . Dry eyes 04/06/2017  . Elevated BP without diagnosis of hypertension 04/06/2017   lost weight >> BP improved  . Fibromyalgia   . Floaters    both eyes  . Heart murmur    hx of 2 Echocardiograms in [place] (pt was told they were normal)  . Hiatal hernia   . Hip pain    and groin pain for a while  . History of colon polyps   . History of echocardiogram    Echo 9/19: mild LVH, EF 60-65, no RWMA, Gr 1 DD, trivial MR/TR, PASP 16  . History of kidney stones   . History of Lyme disease   . Hypercalcemia    causes kidney  stones, none recent  . IBS (irritable bowel syndrome)   . Mixed hyperlipidemia 04/06/2017   intol to statins due to myalgias; never tried Zetia  . Occipital neuralgia   . Sleep apnea    mild to moderate   . TMJ (dislocation of temporomandibular joint)   . UTI (urinary tract infection) finishes antibiotic 03-27-2020  . Vasomotor rhinitis     Past Surgical History:  Procedure Laterality Date  . APPENDECTOMY  1959  . EVALUATION UNDER ANESTHESIA WITH HEMORRHOIDECTOMY N/A 04/02/2020   Procedure: ANORECTAL EXAM UNDER ANESTHESIA WITH HEMORRHOIDECTOMY, HEMORRHOIDAL LIGATION;  Surgeon: [name], MD;  Location: [facility];  Service: General;  Laterality: N/A;  GENERAL AND LOCAL  . left ovarin cyst removed  age 79's  . UPPER GI ENDOSCOPY      Family History  Problem Relation Age of Onset  . Healthy Mother   . Hypertension Mother   . Healthy Father   . Lung cancer Father   . Breast cancer Sister   . Diabetes Paternal Grandmother   . Colon cancer Neg Hx   . Stomach cancer Neg Hx   . Heart attack Neg Hx   . Heart failure Neg Hx   . Pancreatic cancer Neg Hx   . Colon polyps Neg Hx     SOCIAL HX: see hpi   Current Outpatient Medications:  .  acetaminophen (TYLENOL) 500 MG tablet, Take 1,000 mg by mouth every 6 (six) hours as needed for headache (pain)., Disp: , Rfl:  .  AMBULATORY NON FORMULARY MEDICATION, Take 1 tablet by mouth as needed. Medication Name: [facility], Disp: , Rfl:  .  Cholecalciferol (VITAMIN D) 2000 units tablet, Take 2,000 Units by mouth daily. , Disp: , Rfl:  .  gabapentin (NEURONTIN) 100 MG capsule, 100 mg at bedtime as needed. , Disp: , Rfl:  .  Magnesium Glycinate POWD, 120 mg by Does not apply route., Disp: , Rfl:  .  Polyethyl Glycol-Propyl Glycol (SYSTANE ULTRA OP), Apply to eye at bedtime. , Disp: , Rfl:  .  polyethylene glycol powder (GLYCOLAX/MIRALAX) 17 GM/SCOOP powder, Take 17-34 g by mouth daily., Disp: , Rfl:  .  Probiotic Product  (PROBIOTIC ADVANCED PO), Take by mouth., Disp: , Rfl:   EXAM:  VITALS per patient if applicable:  GENERAL: alert, oriented, appears well and in no acute distress  HEENT: atraumatic, conjunttiva clear, no obvious abnormalities on inspection of external nose and ears  NECK: normal movements of the head and neck  LUNGS: on inspection no signs of respiratory distress, breathing rate appears normal, no obvious gross SOB, gasping or wheezing  CV: no obvious cyanosis  MS: moves all visible extremities without noticeable abnormality  PSYCH/NEURO: pleasant and cooperative, no obvious depression or anxiety, speech and thought processing grossly intact  ASSESSMENT AND PLAN:  Discussed the following assessment and plan:  Dysuria  Pelvic pain  Interstitial cystitis  -we discussed possible serious and likely etiologies, options for evaluation and workup, limitations of telemedicine visit vs in person visit, treatment, treatment risks and precautions. Pt prefers to treat via telemedicine empirically rather then risking or undertaking an in person visit at this moment. Query partially treated UTI, IC vs muscle soreness from increased workout and underlying fibromyalgia. She is requesting repeat urine studies as has travel coming up. This is reasonable. Sent message to staff to assist in scheduling UA and urine culture at PCP office. Asked her to contact PCP office for results of culture on Monday if has not heard on results by then or I will be back on telemedicine visits on Tuesday. Patient agrees to seek prompt in person care in the interim if worsening, new symptoms arise, or if is not improving with treatment. Also advised follow up with urologist or PCP if studies not revealing or not resolving promptly.    I discussed the assessment and treatment plan with the patient. The patient was provided an opportunity to ask questions and all were answered. The patient agreed with the plan and  demonstrated an understanding of the instructions.   The patient was advised to call back or seek an in-person evaluation if the symptoms worsen or if the condition fails to improve as anticipated.   Lucretia Kern, DO

## 2020-07-10 LAB — URINE CULTURE
MICRO NUMBER:: 10709703
SPECIMEN QUALITY:: ADEQUATE

## 2020-07-24 DIAGNOSIS — G4733 Obstructive sleep apnea (adult) (pediatric): Secondary | ICD-10-CM | POA: Diagnosis not present

## 2020-08-24 DIAGNOSIS — G4733 Obstructive sleep apnea (adult) (pediatric): Secondary | ICD-10-CM | POA: Diagnosis not present

## 2020-09-04 DIAGNOSIS — R42 Dizziness and giddiness: Secondary | ICD-10-CM | POA: Diagnosis not present

## 2020-09-04 DIAGNOSIS — M797 Fibromyalgia: Secondary | ICD-10-CM | POA: Diagnosis not present

## 2020-09-04 DIAGNOSIS — M5030 Other cervical disc degeneration, unspecified cervical region: Secondary | ICD-10-CM | POA: Diagnosis not present

## 2020-09-04 DIAGNOSIS — R6884 Jaw pain: Secondary | ICD-10-CM | POA: Diagnosis not present

## 2020-09-04 DIAGNOSIS — M5481 Occipital neuralgia: Secondary | ICD-10-CM | POA: Diagnosis not present

## 2020-09-08 ENCOUNTER — Ambulatory Visit: Payer: PPO | Admitting: Internal Medicine

## 2020-09-09 DIAGNOSIS — H524 Presbyopia: Secondary | ICD-10-CM | POA: Diagnosis not present

## 2020-09-09 DIAGNOSIS — H2513 Age-related nuclear cataract, bilateral: Secondary | ICD-10-CM | POA: Diagnosis not present

## 2020-09-09 DIAGNOSIS — H25013 Cortical age-related cataract, bilateral: Secondary | ICD-10-CM | POA: Diagnosis not present

## 2020-09-09 DIAGNOSIS — H04123 Dry eye syndrome of bilateral lacrimal glands: Secondary | ICD-10-CM | POA: Diagnosis not present

## 2020-09-10 ENCOUNTER — Encounter: Payer: Self-pay | Admitting: Internal Medicine

## 2020-09-10 ENCOUNTER — Other Ambulatory Visit: Payer: Self-pay

## 2020-09-10 ENCOUNTER — Encounter: Payer: Self-pay | Admitting: Gastroenterology

## 2020-09-10 ENCOUNTER — Ambulatory Visit (INDEPENDENT_AMBULATORY_CARE_PROVIDER_SITE_OTHER): Payer: PPO | Admitting: Internal Medicine

## 2020-09-10 VITALS — BP 122/68 | HR 77 | Ht 62.0 in | Wt 150.0 lb

## 2020-09-10 DIAGNOSIS — E042 Nontoxic multinodular goiter: Secondary | ICD-10-CM | POA: Diagnosis not present

## 2020-09-10 NOTE — Progress Notes (Signed)
Name: Natalie Erickson  MRN/ DOB: [id number], [birth year]    Age/ Sex: 69 y.o., female     PCP: [name], MD   Reason for Endocrinology Evaluation: MNG     Initial Endocrinology Clinic Visit: 09/10/2019    PATIENT IDENTIFIER: Natalie Erickson is a 69 y.o., female with a past medical history of GAD, fibromyalgia , OSA on CPAP and dyslipidemia. She has followed with [facility] since 09/09/2020 for consultative assistance with management of her MNG.   HISTORICAL SUMMARY:   Pt was found to have an incidental thyroid nodules on MRI 06/2019 during evaluation of a left neck pain with radiculopathy.    An ultrasound 07/2019 confirmed diagnosis of multiple thyroid nodules, with the largest being 3.3 cm at the left middle thyroid lobe . She is S/P benign FNA of the left mid thyroid nodule on 09/17/2019   No FH of thyroid disease   SUBJECTIVE:    Today (09/10/2020):  [name] is here for a follow up on MNG.  Has fibromyalgia and struggles with it . Does tai chi for year and yoga   Has anxiety Has chronic constipation  Denies local neck symptoms        HISTORY:  Past Medical History:  Past Medical History:  Diagnosis Date  . AC (acromioclavicular) joint bone spurs    c 5 and c6 and thoracic  . Anxiety 04/06/2017  . 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 Cystitis   . Depression   . Dry eyes 04/06/2017  . Elevated BP without diagnosis of hypertension 04/06/2017   lost weight >> BP improved  . Fibromyalgia   . Floaters    both eyes  . Heart murmur    hx of 2 Echocardiograms in [place] (pt was told they were normal)  . Hiatal hernia   . Hip pain    and groin pain for a while  . History of colon polyps   . History of echocardiogram    Echo 9/19: mild LVH, EF 60-65, no RWMA, Gr 1 DD, trivial MR/TR, PASP 16  . History of kidney stones   . History of Lyme disease   . Hypercalcemia    causes kidney stones, none recent  . IBS (irritable bowel  syndrome)   . Mixed hyperlipidemia 04/06/2017   intol to statins due to myalgias; never tried Zetia  . Occipital neuralgia   . Sleep apnea    mild to moderate   . TMJ (dislocation of temporomandibular joint)   . UTI (urinary tract infection) finishes antibiotic 03-27-2020  . Vasomotor rhinitis    Past Surgical History:  Past Surgical History:  Procedure Laterality Date  . APPENDECTOMY  1959  . EVALUATION UNDER ANESTHESIA WITH HEMORRHOIDECTOMY N/A 04/02/2020   Procedure: ANORECTAL EXAM UNDER ANESTHESIA WITH HEMORRHOIDECTOMY, HEMORRHOIDAL LIGATION;  Surgeon: [name], MD;  Location: [facility];  Service: General;  Laterality: N/A;  GENERAL AND LOCAL  . left ovarin cyst removed  age 64's  . UPPER GI ENDOSCOPY      Social History:  reports that she has never smoked. She has never used smokeless tobacco. She reports that she does not drink alcohol and does not use drugs. Family History:  Family History  Problem Relation Age of Onset  . Healthy Mother   . Hypertension Mother   . Healthy Father   . Lung cancer Father   . Breast cancer Sister   . Diabetes Paternal Grandmother   .  Colon cancer Neg Hx   . Stomach cancer Neg Hx   . Heart attack Neg Hx   . Heart failure Neg Hx   . Pancreatic cancer Neg Hx   . Colon polyps Neg Hx      HOME MEDICATIONS: Allergies as of 09/10/2020      Reactions   Diltiazem Shortness Of Breath   Metoprolol Shortness Of Breath   Statins Tinitus   Achy joints, muscle aches Achy joints, muscle aches   Chocolate Other (See Comments)   migraine   Cocoa Other (See Comments)   migraine migraine   Codeine Nausea And Vomiting   Cranberry Other (See Comments)   Cystitis   Latex Rash   Monosodium Glutamate Other (See Comments)   Migraine   Penicillins Rash   Has patient had a PCN reaction causing immediate rash, facial/tongue/throat swelling, SOB or lightheadedness with hypotension: Yes Has patient had a PCN reaction causing severe  rash involving mucus membranes or skin necrosis: No Has patient had a PCN reaction that required hospitalization No Has patient had a PCN reaction occurring within the last 10 years: No If all of the above answers are "NO", then may proceed with Cephalosporin use.   Antihistamines, Loratadine-type    Throat swelling   Carvedilol Other (See Comments)   N&N, SOB , Head ache and joint pain.    Eggs Or Egg-derived Products    Ginger    Gluten Meal    Lexapro [escitalopram]    GI    Montelukast Other (See Comments)   Throat swelling   Other Swelling   Throat swelling   Prednisone Swelling   Throat swelling (no difficulty breathing)   Sulfites Other (See Comments)   Nausea and headaches   Vanilla    Whey    Zofran [ondansetron Hcl] Other (See Comments)   Muscle cramps, leg tingling   Azithromycin Other (See Comments), Diarrhea      Medication List       Accurate as of September 10, 2020 10:40 AM. If you have any questions, ask your nurse or doctor.        STOP taking these medications   PROBIOTIC ADVANCED PO Stopped by: Dorita Sciara, MD     TAKE these medications   acetaminophen 500 MG tablet Commonly known as: TYLENOL Take 1,000 mg by mouth every 6 (six) hours as needed for headache (pain).   AMBULATORY NON FORMULARY MEDICATION Take 1 tablet by mouth as needed. Medication Name: [name]   gabapentin 100 MG capsule Commonly known as: NEURONTIN Take 100 mg by mouth at bedtime as needed. Take 1 capsule ($RemoveBe'100mg'maFmovtzp$  total) by mouth in the morning and at night as needed for Fibro Myalgia.   Magnesium Glycinate Powd 120 mg by Does not apply route as needed.   polyethylene glycol powder 17 GM/SCOOP powder Commonly known as: GLYCOLAX/MIRALAX Take 17-34 g by mouth daily.   SYSTANE ULTRA OP Apply to eye at bedtime.   Vitamin D 50 MCG (2000 UT) tablet Take 2,000 Units by mouth daily.        OBJECTIVE:   PHYSICAL EXAM: VS: BP 122/68 (BP Location: Left Arm,  Patient Position: Sitting, Cuff Size: Normal)   Pulse 77   Ht $R'5\' 2"'OF$  (1.575 m)   Wt 150 lb (68 kg)   SpO2 97%   BMI 27.44 kg/m    EXAM: General: Pt appears well and is in NAD  Neck: General: Supple without adenopathy. Thyroid: Thyroid size normal.  No goiter  or nodules appreciated. No thyroid bruit.  Lungs: Clear with good BS bilat with no rales, rhonchi, or wheezes  Heart: Auscultation: RRR.  Abdomen: Normoactive bowel sounds, soft, nontender, without masses or organomegaly palpable  Extremities:  BL LE: No pretibial edema normal ROM and strength.  Mental Status: Judgment, insight: Intact Orientation: Oriented to time, place, and person Mood and affect: No depression, anxiety, or agitation     DATA REVIEWED: Results for TOMICKA, LOVER ANN (MRN [id number]) as of 09/13/2020 14:48  Ref. Range 06/04/2019 09:50  TSH Latest Ref Range: 0.35 - 4.50 uIU/mL 0.64  T4,Free(Direct) Latest Ref Range: 0.60 - 1.60 ng/dL 0.89       FNA 09/17/2019 Clinical History: Left mid 3.3cm; Other 2 dimensions: 2.3 x 1.7cm, Solid  DIAGNOSIS:  - Consistent with benign follicular nodule (Bethesda category II)   ASSESSMENT / PLAN / RECOMMENDATIONS:   1. Multinodular Goiter:  - Pt is clinically euthyroid - No local neck symptoms  - She will have her TFT's checked at PCP's office during physical, per pt.  - S/P benign FNA of the left mid nodule (08/2019) - Will proceed with thyroid ultrasound   F/U in 1 yr    Signed electronically by: Mack Guise, MD  [facility]  [address] [place], [place] [address] Phone: [phone] FAX: [phone]      CC: [name], [place] [address] Phone: [phone]  Fax: [phone]   Return to Endocrinology clinic as below: Future Appointments  Date Time Provider [place]  12/10/2020 10:30 AM Debbora Presto, NP [facility] None

## 2020-09-16 ENCOUNTER — Encounter: Payer: Self-pay | Admitting: Gastroenterology

## 2020-09-18 ENCOUNTER — Ambulatory Visit
Admission: RE | Admit: 2020-09-18 | Discharge: 2020-09-18 | Disposition: A | Discharge status: Home / Self Care | Payer: PPO | Source: Ambulatory Visit | Attending: Internal Medicine | Admitting: Internal Medicine

## 2020-09-18 DIAGNOSIS — E041 Nontoxic single thyroid nodule: Secondary | ICD-10-CM | POA: Diagnosis not present

## 2020-09-18 DIAGNOSIS — E042 Nontoxic multinodular goiter: Secondary | ICD-10-CM

## 2020-09-22 DIAGNOSIS — M791 Myalgia, unspecified site: Secondary | ICD-10-CM | POA: Diagnosis not present

## 2020-09-22 DIAGNOSIS — M9901 Segmental and somatic dysfunction of cervical region: Secondary | ICD-10-CM | POA: Diagnosis not present

## 2020-09-22 DIAGNOSIS — M9902 Segmental and somatic dysfunction of thoracic region: Secondary | ICD-10-CM | POA: Diagnosis not present

## 2020-09-24 DIAGNOSIS — G4733 Obstructive sleep apnea (adult) (pediatric): Secondary | ICD-10-CM | POA: Diagnosis not present

## 2020-09-29 DIAGNOSIS — M9901 Segmental and somatic dysfunction of cervical region: Secondary | ICD-10-CM | POA: Diagnosis not present

## 2020-09-29 DIAGNOSIS — M9902 Segmental and somatic dysfunction of thoracic region: Secondary | ICD-10-CM | POA: Diagnosis not present

## 2020-09-29 DIAGNOSIS — M791 Myalgia, unspecified site: Secondary | ICD-10-CM | POA: Diagnosis not present

## 2020-10-06 DIAGNOSIS — M9902 Segmental and somatic dysfunction of thoracic region: Secondary | ICD-10-CM | POA: Diagnosis not present

## 2020-10-06 DIAGNOSIS — M791 Myalgia, unspecified site: Secondary | ICD-10-CM | POA: Diagnosis not present

## 2020-10-06 DIAGNOSIS — M9901 Segmental and somatic dysfunction of cervical region: Secondary | ICD-10-CM | POA: Diagnosis not present

## 2020-10-13 DIAGNOSIS — M9901 Segmental and somatic dysfunction of cervical region: Secondary | ICD-10-CM | POA: Diagnosis not present

## 2020-10-13 DIAGNOSIS — M9902 Segmental and somatic dysfunction of thoracic region: Secondary | ICD-10-CM | POA: Diagnosis not present

## 2020-10-13 DIAGNOSIS — M791 Myalgia, unspecified site: Secondary | ICD-10-CM | POA: Diagnosis not present

## 2020-10-20 DIAGNOSIS — G4733 Obstructive sleep apnea (adult) (pediatric): Secondary | ICD-10-CM | POA: Diagnosis not present

## 2020-10-28 ENCOUNTER — Other Ambulatory Visit: Payer: Self-pay | Admitting: *Deleted

## 2020-10-28 ENCOUNTER — Encounter: Payer: Self-pay | Admitting: Family Medicine

## 2020-10-28 ENCOUNTER — Other Ambulatory Visit: Payer: Self-pay

## 2020-10-28 ENCOUNTER — Ambulatory Visit (INDEPENDENT_AMBULATORY_CARE_PROVIDER_SITE_OTHER): Payer: PPO | Admitting: Family Medicine

## 2020-10-28 VITALS — BP 127/78 | HR 68 | Temp 97.6°F | Ht 62.0 in | Wt 151.8 lb

## 2020-10-28 DIAGNOSIS — E782 Mixed hyperlipidemia: Secondary | ICD-10-CM

## 2020-10-28 DIAGNOSIS — G4733 Obstructive sleep apnea (adult) (pediatric): Secondary | ICD-10-CM

## 2020-10-28 DIAGNOSIS — Z789 Other specified health status: Secondary | ICD-10-CM | POA: Diagnosis not present

## 2020-10-28 DIAGNOSIS — E042 Nontoxic multinodular goiter: Secondary | ICD-10-CM

## 2020-10-28 DIAGNOSIS — E559 Vitamin D deficiency, unspecified: Secondary | ICD-10-CM

## 2020-10-28 DIAGNOSIS — E538 Deficiency of other specified B group vitamins: Secondary | ICD-10-CM

## 2020-10-28 DIAGNOSIS — Z0000 Encounter for general adult medical examination without abnormal findings: Secondary | ICD-10-CM

## 2020-10-28 DIAGNOSIS — Z9989 Dependence on other enabling machines and devices: Secondary | ICD-10-CM

## 2020-10-28 DIAGNOSIS — M797 Fibromyalgia: Secondary | ICD-10-CM

## 2020-10-28 DIAGNOSIS — Z6827 Body mass index (BMI) 27.0-27.9, adult: Secondary | ICD-10-CM

## 2020-10-28 DIAGNOSIS — F339 Major depressive disorder, recurrent, unspecified: Secondary | ICD-10-CM

## 2020-10-28 DIAGNOSIS — I700 Atherosclerosis of aorta: Secondary | ICD-10-CM | POA: Diagnosis not present

## 2020-10-28 DIAGNOSIS — Z0001 Encounter for general adult medical examination with abnormal findings: Secondary | ICD-10-CM | POA: Diagnosis not present

## 2020-10-28 DIAGNOSIS — F419 Anxiety disorder, unspecified: Secondary | ICD-10-CM | POA: Diagnosis not present

## 2020-10-28 NOTE — Assessment & Plan Note (Signed)
Not able to tolerate statins. 

## 2020-10-28 NOTE — Assessment & Plan Note (Signed)
Stable without medications.  

## 2020-10-28 NOTE — Progress Notes (Signed)
Chief Complaint:  Natalie Erickson is a 69 y.o. female who presents today for a subsequent Medicare Annual Wellness Visit and to discuss management of her chronic medical problems.  Assessment/Plan:  Chronic Problems Addressed Today: Multiple thyroid nodules Followed by endocrinology.  Check TSH, free T4, free T3.  OSA on CPAP Stable.  Continue CPAP.  Statin intolerance Not able to tolerate statins.  Aortic atherosclerosis ([place]) Not able to tolerate statins.  Working on lifestyle modifications.  Depression, recurrent ([place]) PHQ score 1 without medications.  Fibromyalgia Overall stable.  Without meds.  Anxiety Stable without medications.  Mixed hyperlipidemia Check lipids.  Has not been able to tolerate statin in the past.  B12 deficiency Check B12.  Continue supplementation.  Vitamin D deficiency Check vitamin D.  Continue supplementation.   Body mass index is 27.76 kg/m. / Overweight  BMI Metric Follow Up - 10/28/20 0912      BMI Metric Follow Up-Please document annually   BMI Metric Follow Up Education provided            Preventative Healthcare Up-to-date on flu and Covid vaccine.  Will be getting Covid booster soon.  Due for shingles vaccine.  She will probably get this in the next few months.  Will be getting mammogram next month.  Up-to-date on colon cancer screening.  Deferred pneumonia vaccine.  Check CBC, CMET, TSH, lipid panel. There are no preventive care reminders to display for this patient.  During the course of the visit the patient was educated and counseled about appropriate screening and preventive services including:        Fall prevention   Nutrition Physical Activity Weight Management Cognition    Subjective:  HPI:  Health Risk Assessment: Patient considers her overall health to be good. He has no difficulty performing the following: . Preparing food and eating . Bathing  . Getting dressed . Using the  toilet . Shopping . Managing Finances . Moving around from place to place  She has not had any falls within the past year.   Depression screen [facility] 2/9 10/28/2020  Decreased Interest 0  Down, Depressed, Hopeless 0  PHQ - 2 Score 0  Altered sleeping 0  Tired, decreased energy 0  Change in appetite 1  Feeling bad or failure about yourself  0  Trouble concentrating 0  Moving slowly or fidgety/restless 0  Suicidal thoughts 0  PHQ-9 Score 1  Difficult doing work/chores Not difficult at all  Some recent data might be hidden   Lifestyle Factors: Diet: Balanced. Plenty of lean proteins.  Exercise: Does a lot of walking. Going to the [facility].   Patient Care Team: [name], MD as PCP - General (Family Medicine) [name], MD as PCP - Cardiology (Cardiology) Loletha Carrow Kirke Corin, MD as Consulting Physician (Gastroenterology) Murrell Redden Earlyne Iba, MD as Consulting Physician (Obstetrics and Gynecology) Hortencia Pilar, MD as Consulting Physician (Surgery) Lyndee Hensen, PT as Physical Therapist (Physical Therapy) Kennith Center, RD as Dietitian (Family Medicine) [name], MD as Consulting Physician (General Surgery)   She has no acute complaints today.   ROS: Per HPI, otherwise a complete review of systems was negative.   PMH:  The following were reviewed and entered/updated in epic: Past Medical History:  Diagnosis Date  . AC (acromioclavicular) joint bone spurs    c 5 and c6 and thoracic  . Anxiety 04/06/2017  . 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 Cystitis   . Depression   .  Dry eyes 04/06/2017  . Elevated BP without diagnosis of hypertension 04/06/2017   lost weight >> BP improved  . Fibromyalgia   . Floaters    both eyes  . Heart murmur    hx of 2 Echocardiograms in [place] (pt was told they were normal)  . Hiatal hernia   . Hip pain    and groin pain for a while  . History of colon polyps   . History of echocardiogram    Echo 9/19: mild LVH,  EF 60-65, no RWMA, Gr 1 DD, trivial MR/TR, PASP 16  . History of kidney stones   . History of Lyme disease   . Hypercalcemia    causes kidney stones, none recent  . IBS (irritable bowel syndrome)   . Mixed hyperlipidemia 04/06/2017   intol to statins due to myalgias; never tried Zetia  . Occipital neuralgia   . Sleep apnea    mild to moderate   . TMJ (dislocation of temporomandibular joint)   . UTI (urinary tract infection) finishes antibiotic 03-27-2020  . Vasomotor rhinitis    Patient Active Problem List   Diagnosis Date Noted  . Depression, recurrent ([place]) 10/28/2020  . Aortic atherosclerosis ([place]) 10/28/2020  . Statin intolerance 10/28/2020  . OSA on CPAP 10/28/2020  . Multiple thyroid nodules 10/28/2020  . Vitamin D deficiency 10/28/2020  . B12 deficiency 10/28/2020  . DDD (degenerative disc disease), cervical 01/08/2020  . Bilateral occipital neuralgia 01/08/2020  . 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 Posterior vitreous detachment of right eye 09/10/2018  . History of IBS 01/30/2018  . Fibromyalgia 06/04/2017  . Chronic fatigue 06/04/2017  . ANA positive 05/20/2017  . 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 Interstitial cystitis 05/20/2017  . Dyspepsia 05/20/2017  . Mixed hyperlipidemia 04/06/2017  . Anxiety 04/06/2017   Past Surgical History:  Procedure Laterality Date  . APPENDECTOMY  1959  . EVALUATION UNDER ANESTHESIA WITH HEMORRHOIDECTOMY N/A 04/02/2020   Procedure: ANORECTAL EXAM UNDER ANESTHESIA WITH HEMORRHOIDECTOMY, HEMORRHOIDAL LIGATION;  Surgeon: [name], MD;  Location: [facility];  Service: General;  Laterality: N/A;  GENERAL AND LOCAL  . left ovarin cyst removed  age 84's  . UPPER GI ENDOSCOPY      Family History  Problem Relation Age of Onset  . Healthy Mother   . Hypertension Mother   . Healthy Father   . Lung cancer Father   . Breast cancer Sister   . Diabetes Paternal Grandmother   . Colon cancer Neg Hx   . Stomach cancer Neg  Hx   . Heart attack Neg Hx   . Heart failure Neg Hx   . Pancreatic cancer Neg Hx   . Colon polyps Neg Hx     Medications- reviewed and updated Current Outpatient Medications  Medication Sig Dispense Refill  . acetaminophen (TYLENOL) 500 MG tablet Take 1,000 mg by mouth every 6 (six) hours as needed for headache (pain).    . AMBULATORY NON FORMULARY MEDICATION Take 1 tablet by mouth as needed. Medication Name: [name]    . Cholecalciferol (VITAMIN D) 2000 units tablet Take 2,000 Units by mouth daily.     . Magnesium Glycinate POWD 120 mg by Does not apply route as needed.     [name] (SYSTANE ULTRA OP) Apply to eye at bedtime.     . polyethylene glycol powder (GLYCOLAX/MIRALAX) 17 GM/SCOOP powder Take 17-34 g by mouth daily.     No current facility-administered medications for this visit.  Allergies-reviewed and updated Allergies  Allergen Reactions  . Diltiazem Shortness Of Breath  . Metoprolol Shortness Of Breath  . Statins Tinitus    Achy joints, muscle aches Achy joints, muscle aches  . Chocolate Other (See Comments)    migraine  . Cocoa Other (See Comments)    migraine migraine  . Codeine Nausea And Vomiting  . Cranberry Other (See Comments)    Cystitis  . Latex Rash  . Monosodium Glutamate Other (See Comments)    Migraine  . Penicillins Rash    Has patient had a PCN reaction causing immediate rash, facial/tongue/throat swelling, SOB or lightheadedness with hypotension: Yes Has patient had a PCN reaction causing severe rash involving mucus membranes or skin necrosis: No Has patient had a PCN reaction that required hospitalization No Has patient had a PCN reaction occurring within the last 10 years: No If all of the above answers are "NO", then may proceed with Cephalosporin use.   Marland Kitchen Antihistamines, Loratadine-Type     Throat swelling  . Carvedilol Other (See Comments)    N&N, SOB , Head ache and joint pain.   . Eggs Or Egg-Derived  Products   . Ginger   . Gluten Meal   . Lexapro [Escitalopram]     GI   . Montelukast Other (See Comments)    Throat swelling  . Other Swelling    Throat swelling  . Prednisone Swelling    Throat swelling (no difficulty breathing)  . Sulfites Other (See Comments)    Nausea and headaches  . Vanilla   . Whey   . Zofran [Ondansetron Hcl] Other (See Comments)    Muscle cramps, leg tingling  . Azithromycin Other (See Comments) and Diarrhea    Social History   Socioeconomic History  . Marital status: Married    Spouse name: Not on file  . Number of children: 2  . Years of education: Therapist, sports  . Highest education level: Not on file  Occupational History  . Occupation: Retired  Tobacco Use  . Smoking status: Never Smoker  . Smokeless tobacco: Never Used  Vaping Use  . Vaping Use: Never used  Substance and Sexual Activity  . Alcohol use: No  . Drug use: No  . Sexual activity: Yes  Other Topics Concern  . 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 Difficulty of Paying Living Expenses: Not on file  Food Insecurity:   . Worried About Charity fundraiser in the Last Year: Not on file  . Ran Out of Food in the Last Year: Not on file  Transportation Needs:   . Lack of Transportation (Medical): Not on file  . Lack of Transportation (Non-Medical): Not on file  Physical Activity:   . Days of Exercise per Week: Not on file  . Minutes of Exercise per Session: Not on file  Stress:   . Feeling of Stress : Not on file  Social Connections:   . Frequency of Communication with Friends and Family: Not on file  . Frequency of Social Gatherings with Friends and Family: Not on file  . Attends Religious Services: Not on file  . Active Member of Clubs  or Organizations: Not on file  .  Attends Archivist Meetings: Not on file  . Marital Status: Not on file         Objective/Observations  Physical Exam: BP 127/78   Pulse 68   Temp 97.6 F (36.4 C) (Temporal)   Ht 5\' 2"  (1.575 m)   Wt 151 lb 12.8 oz (68.9 kg)   SpO2 99%   BMI 27.76 kg/m  Gen: NAD, resting comfortably HEENT: TMs normal bilaterally. OP clear. No thyromegaly noted.  CV: RRR with no murmurs appreciated Pulm: NWOB, CTAB with no crackles, wheezes, or rhonchi GI: Normal bowel sounds present. Soft, Nontender, Nondistended. MSK: no edema, cyanosis, or clubbing noted Skin: warm, dry Neuro: CN2-12 grossly intact. Strength 5/5 in upper and lower extremities. Reflexes symmetric and intact bilaterally. Normal minicog with 3/3 delayed word recall. Psych: Normal affect and thought content  No results found for this or any previous visit (from the past 24 hour(s)).      [name]. [name], MD 10/28/2020 9:16 AM

## 2020-10-28 NOTE — Assessment & Plan Note (Signed)
Check B12.  Continue supplementation. °

## 2020-10-28 NOTE — Assessment & Plan Note (Signed)
Followed by endocrinology.  Check TSH, free T4, free T3.

## 2020-10-28 NOTE — Assessment & Plan Note (Signed)
Check lipids.  Has not been able to tolerate statin in the past.

## 2020-10-28 NOTE — Assessment & Plan Note (Signed)
Overall stable.  Without meds.

## 2020-10-28 NOTE — Assessment & Plan Note (Signed)
Stable.  -Continue CPAP

## 2020-10-28 NOTE — Assessment & Plan Note (Signed)
Not able to tolerate statins.  Working on lifestyle modifications.

## 2020-10-28 NOTE — Assessment & Plan Note (Signed)
Check vitamin D.  Continue supplementation. °

## 2020-10-28 NOTE — Patient Instructions (Signed)
It was very nice to see you today!  We will check blood work today.  Keep up the good work.  I will see you back in year for your next annual checkup.  Please come back to see me sooner as needed.  Take care, Dr Parker  Please try these tips to maintain a healthy lifestyle:   Eat at least 3 REAL meals and 1-2 snacks per day.  Aim for no more than 5 hours between eating.  If you eat breakfast, please do so within one hour of getting up.    Each meal should contain half fruits/vegetables, one quarter protein, and one quarter carbs (no bigger than a computer mouse)   Cut down on sweet beverages. This includes juice, soda, and sweet tea.     Drink at least 1 glass of water with each meal and aim for at least 8 glasses per day   Exercise at least 150 minutes every week.    Preventive Care 65 Years and Older, Female Preventive care refers to lifestyle choices and visits with your health care provider that can promote health and wellness. This includes:  A yearly physical exam. This is also called an annual well check.  Regular dental and eye exams.  Immunizations.  Screening for certain conditions.  Healthy lifestyle choices, such as diet and exercise. What can I expect for my preventive care visit? Physical exam Your health care provider will check:  Height and weight. These may be used to calculate body mass index (BMI), which is a measurement that tells if you are at a healthy weight.  Heart rate and blood pressure.  Your skin for abnormal spots. Counseling Your health care provider may ask you questions about:  Alcohol, tobacco, and drug use.  Emotional well-being.  Home and relationship well-being.  Sexual activity.  Eating habits.  History of falls.  Memory and ability to understand (cognition).  Work and work environment.  Pregnancy and menstrual history. What immunizations do I need?  Influenza (flu) vaccine  This is recommended every  year. Tetanus, diphtheria, and pertussis (Tdap) vaccine  You may need a Td booster every 10 years. Varicella (chickenpox) vaccine  You may need this vaccine if you have not already been vaccinated. Zoster (shingles) vaccine  You may need this after age 60. Pneumococcal conjugate (PCV13) vaccine  One dose is recommended after age 65. Pneumococcal polysaccharide (PPSV23) vaccine  One dose is recommended after age 65. Measles, mumps, and rubella (MMR) vaccine  You may need at least one dose of MMR if you were born in 1957 or later. You may also need a second dose. Meningococcal conjugate (MenACWY) vaccine  You may need this if you have certain conditions. Hepatitis A vaccine  You may need this if you have certain conditions or if you travel or work in places where you may be exposed to hepatitis A. Hepatitis B vaccine  You may need this if you have certain conditions or if you travel or work in places where you may be exposed to hepatitis B. Haemophilus influenzae type b (Hib) vaccine  You may need this if you have certain conditions. You may receive vaccines as individual doses or as more than one vaccine together in one shot (combination vaccines). Talk with your health care provider about the risks and benefits of combination vaccines. What tests do I need? Blood tests  Lipid and cholesterol levels. These may be checked every 5 years, or more frequently depending on your overall health.    Hepatitis C test.  Hepatitis B test. Screening  Lung cancer screening. You may have this screening every year starting at age 55 if you have a 30-pack-year history of smoking and currently smoke or have quit within the past 15 years.  Colorectal cancer screening. All adults should have this screening starting at age 50 and continuing until age 75. Your health care provider may recommend screening at age 45 if you are at increased risk. You will have tests every 1-10 years, depending on  your results and the type of screening test.  Diabetes screening. This is done by checking your blood sugar (glucose) after you have not eaten for a while (fasting). You may have this done every 1-3 years.  Mammogram. This may be done every 1-2 years. Talk with your health care provider about how often you should have regular mammograms.  BRCA-related cancer screening. This may be done if you have a family history of breast, ovarian, tubal, or peritoneal cancers. Other tests  Sexually transmitted disease (STD) testing.  Bone density scan. This is done to screen for osteoporosis. You may have this done starting at age 65. Follow these instructions at home: Eating and drinking  Eat a diet that includes fresh fruits and vegetables, whole grains, lean protein, and low-fat dairy products. Limit your intake of foods with high amounts of sugar, saturated fats, and salt.  Take vitamin and mineral supplements as recommended by your health care provider.  Do not drink alcohol if your health care provider tells you not to drink.  If you drink alcohol: ? Limit how much you have to 0-1 drink a day. ? Be aware of how much alcohol is in your drink. In the [place] one drink equals one 12 oz bottle of beer (355 mL), one 5 oz glass of wine (148 mL), or one 1 oz glass of hard liquor (44 mL). Lifestyle  Take daily care of your teeth and gums.  Stay active. Exercise for at least 30 minutes on 5 or more days each week.  Do not use any products that contain nicotine or tobacco, such as cigarettes, e-cigarettes, and chewing tobacco. If you need help quitting, ask your health care provider.  If you are sexually active, practice safe sex. Use a condom or other form of protection in order to prevent STIs (sexually transmitted infections).  Talk with your health care provider about taking a low-dose aspirin or statin. What's next?  Go to your health care provider once a year for a well check visit.  Ask  your health care provider how often you should have your eyes and teeth checked.  Stay up to date on all vaccines. This information is not intended to replace advice given to you by your health care provider. Make sure you discuss any questions you have with your health care provider. Document Revised: 12/06/2018 Document Reviewed: 12/06/2018 Elsevier Patient Education  2020 Elsevier Inc.  

## 2020-10-28 NOTE — Assessment & Plan Note (Signed)
PHQ score 1 without medications.

## 2020-10-29 LAB — CBC
HCT: 42.6 % (ref 35.0–45.0)
Hemoglobin: 14.1 g/dL (ref 11.7–15.5)
MCH: 31.2 pg (ref 27.0–33.0)
MCHC: 33.1 g/dL (ref 32.0–36.0)
MCV: 94.2 fL (ref 80.0–100.0)
MPV: 10.3 fL (ref 7.5–12.5)
Platelets: 388 10*3/uL (ref 140–400)
RBC: 4.52 10*6/uL (ref 3.80–5.10)
RDW: 11.4 % (ref 11.0–15.0)
WBC: 8.3 10*3/uL (ref 3.8–10.8)

## 2020-10-29 LAB — HEMOGLOBIN A1C
Hgb A1c MFr Bld: 5.4 % of total Hgb (ref <5.7)
Mean Plasma Glucose: 108 (calc)
eAG (mmol/L): 6 (calc)

## 2020-10-29 LAB — LIPID PANEL
Cholesterol: 260 mg/dL — ABNORMAL HIGH (ref <200)
HDL: 63 mg/dL (ref >=50)
LDL Cholesterol (Calc): 174 mg/dL (calc) — ABNORMAL HIGH
Non-HDL Cholesterol (Calc): 197 mg/dL (calc) — ABNORMAL HIGH (ref <130)
Total CHOL/HDL Ratio: 4.1 (calc) (ref <5.0)
Triglycerides: 104 mg/dL (ref <150)

## 2020-10-29 LAB — COMPREHENSIVE METABOLIC PANEL
AG Ratio: 1.6 (calc) (ref 1.0–2.5)
ALT: 11 U/L (ref 6–29)
AST: 17 U/L (ref 10–35)
Albumin: 4.6 g/dL (ref 3.6–5.1)
Alkaline phosphatase (APISO): 143 U/L (ref 37–153)
BUN: 19 mg/dL (ref 7–25)
CO2: 27 mmol/L (ref 20–32)
Calcium: 10.2 mg/dL (ref 8.6–10.4)
Chloride: 103 mmol/L (ref 98–110)
Creat: 0.7 mg/dL (ref 0.50–0.99)
Globulin: 2.8 g/dL (calc) (ref 1.9–3.7)
Glucose, Bld: 89 mg/dL (ref 65–99)
Potassium: 4.5 mmol/L (ref 3.5–5.3)
Sodium: 142 mmol/L (ref 135–146)
Total Bilirubin: 0.6 mg/dL (ref 0.2–1.2)
Total Protein: 7.4 g/dL (ref 6.1–8.1)

## 2020-10-29 LAB — MAGNESIUM: Magnesium: 2.3 mg/dL (ref 1.5–2.5)

## 2020-10-29 LAB — T4, FREE: Free T4: 1.1 ng/dL (ref 0.8–1.8)

## 2020-10-29 LAB — T3, FREE: T3, Free: 3.3 pg/mL (ref 2.3–4.2)

## 2020-10-29 LAB — VITAMIN B12: Vitamin B-12: 549 pg/mL (ref 200–1100)

## 2020-10-29 LAB — VITAMIN D 25 HYDROXY (VIT D DEFICIENCY, FRACTURES): Vit D, 25-Hydroxy: 27 ng/mL — ABNORMAL LOW (ref 30–100)

## 2020-10-29 LAB — TSH: TSH: 0.64 mIU/L (ref 0.40–4.50)

## 2020-10-30 NOTE — Progress Notes (Signed)
Please inform patient of the following:  Vitamin D is low - Recommend starting 2000IU daily. Cholesterol is high. I know she has not tolerated statins in the past but there are other medications we could try if she is interested. Everything else is NORMAL. Would like for her to keep working on diet and exercise and we can recheck in a year.  [name]. [name], MD 10/30/2020 8:07 AM

## 2020-11-02 ENCOUNTER — Telehealth: Payer: Self-pay

## 2020-11-02 ENCOUNTER — Ambulatory Visit (AMBULATORY_SURGERY_CENTER): Payer: Self-pay

## 2020-11-02 ENCOUNTER — Other Ambulatory Visit: Payer: Self-pay

## 2020-11-02 ENCOUNTER — Encounter: Payer: Self-pay | Admitting: Gastroenterology

## 2020-11-02 VITALS — Ht 62.0 in | Wt 154.0 lb

## 2020-11-02 DIAGNOSIS — Z8719 Personal history of other diseases of the digestive system: Secondary | ICD-10-CM

## 2020-11-02 DIAGNOSIS — K5902 Outlet dysfunction constipation: Secondary | ICD-10-CM

## 2020-11-02 DIAGNOSIS — Z86010 Personal history of colonic polyps: Secondary | ICD-10-CM

## 2020-11-02 NOTE — Telephone Encounter (Signed)
Pt states in April she had her "jaw broken" during her hemorrhoidectomy, and she needs a pediatric sized bite block. Let her know I will give the information to you to review if she would need to be done at [facility] due to this, pt had EGD at [facility] in 2018.   anesthesia report states "atraumatic intubation" but also states " difficulty was anticipated and difficult airway- due to limited oral opening"   Please advise where pt's procedure should be done, thanks.

## 2020-11-02 NOTE — Telephone Encounter (Signed)
Di Kindle,  I have reviewed the anesthesia note of 04/02/20 and this pt was intubated UDV with a laryngoscope and MAC 3 blade.  Consequently she is cleared for anesthetic care at [facility].  Thanks,  Osvaldo Angst

## 2020-11-02 NOTE — Progress Notes (Signed)
States  egg or soy allergy known to patient, Pt states it is GI upset and states had propofol in past with no problem, including in April of 2021.  Pt states no  issues with past sedation with any surgeries or procedures , however, states she had intubation problems in the past  :  Pt states in April she had her "jaw broken" during her hemorrhoidectomy, anesthesia report states "atraumatic intubation" but also states " difficulty was anticipated and difficult airway- due to limited oral opening"   No FH of Malignant Hyperthermia No diet pills per patient No home 02 use per patient  No blood thinners per patient  Pt denies issues with constipation  No A fib or A flutter  EMMI video to pt or via MyChart  COVID 19 guidelines implemented in [facility] today with Pt and RN     PT REFUSES Plenvu DUE TO ALLERGIES, tried to explain to pt Dr Loletha Carrow prefers other preps to miralax, pt insists on miralax, pt given:   2 DAY [place]. Dr Loletha Carrow informed of pt c/o.   Due to the COVID-19 pandemic we are asking patients to follow these guidelines. Please only bring one care partner. Please be aware that your care partner may wait in the car in the parking lot or if they feel like they will be too hot to wait in the car, they may wait in the lobby on the 4th floor. All care partners are required to wear a mask the entire time (we do not have any that we can provide them), they need to practice social distancing, and we will do a Covid check for all patient's and care partners when you arrive. Also we will check their temperature and your temperature. If the care partner waits in their car they need to stay in the parking lot the entire time and we will call them on their cell phone when the patient is ready for discharge so they can bring the car to the front of the building. Also all patient's will need to wear a mask into building.

## 2020-11-11 ENCOUNTER — Telehealth: Payer: Self-pay | Admitting: Gastroenterology

## 2020-11-11 NOTE — Telephone Encounter (Signed)
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

## 2020-11-11 NOTE — Telephone Encounter (Signed)
Patient calling for advise states she is developing a cold and would like to know if she would be able to still come in for her upcoming procedure

## 2020-11-13 DIAGNOSIS — M9902 Segmental and somatic dysfunction of thoracic region: Secondary | ICD-10-CM | POA: Diagnosis not present

## 2020-11-13 DIAGNOSIS — M791 Myalgia, unspecified site: Secondary | ICD-10-CM | POA: Diagnosis not present

## 2020-11-13 DIAGNOSIS — M9901 Segmental and somatic dysfunction of cervical region: Secondary | ICD-10-CM | POA: Diagnosis not present

## 2020-11-16 ENCOUNTER — Other Ambulatory Visit: Payer: Self-pay

## 2020-11-16 ENCOUNTER — Ambulatory Visit (AMBULATORY_SURGERY_CENTER): Payer: PPO | Admitting: Gastroenterology

## 2020-11-16 ENCOUNTER — Encounter: Payer: Self-pay | Admitting: Gastroenterology

## 2020-11-16 VITALS — BP 123/63 | HR 71 | Temp 98.4°F | Resp 13 | Ht 62.0 in | Wt 154.0 lb

## 2020-11-16 DIAGNOSIS — Z86010 Personal history of colonic polyps: Secondary | ICD-10-CM

## 2020-11-16 DIAGNOSIS — K581 Irritable bowel syndrome with constipation: Secondary | ICD-10-CM | POA: Diagnosis not present

## 2020-11-16 MED ORDER — SODIUM CHLORIDE 0.9 % IV SOLN: 500.0000 mL | Freq: Once | INTRAVENOUS | Status: DC

## 2020-11-16 NOTE — Progress Notes (Signed)
Pt's states no medical or surgical changes since previsit or office visit. 

## 2020-11-16 NOTE — Op Note (Signed)
[place] Patient Name: Natalie Erickson Procedure Date: 11/16/2020 11:01 AM MRN: [id number] Endoscopist: Mallie Mussel L. Loletha Carrow , MD Age: 69 Referring MD:  Date of Birth: 06-29-1951 Gender: Female Account #: [id number] Procedure:                Colonoscopy Indications:              Surveillance: Personal history of colonic polyps                            (unknown histology) on last colonoscopy 5 years ago Medicines:                Monitored Anesthesia Care Procedure:                Pre-Anesthesia Assessment:                           - Prior to the procedure, a History and Physical                            was performed, and patient medications and                            allergies were reviewed. The patient's tolerance of                            previous anesthesia was also reviewed. The risks                            and benefits of the procedure and the sedation                            options and risks were discussed with the patient.                            All questions were answered, and informed consent                            was obtained. Prior Anticoagulants: The patient has                            taken no previous anticoagulant or antiplatelet                            agents. ASA Grade Assessment: II - A patient with                            mild systemic disease. After reviewing the risks                            and benefits, the patient was deemed in                            satisfactory condition to undergo the procedure.  After obtaining informed consent, the colonoscope                            was passed under direct vision. Throughout the                            procedure, the patient's blood pressure, pulse, and                            oxygen saturations were monitored continuously. The                            Colonoscope was introduced through the anus and                            advanced  to the the cecum, identified by                            appendiceal orifice and ileocecal valve. The                            colonoscopy was somewhat difficult due to a                            redundant colon and significant looping. Successful                            completion of the procedure was aided by changing                            the patient to a supine position and using manual                            pressure. The patient tolerated the procedure well.                            The quality of the bowel preparation was good. The                            ileocecal valve, appendiceal orifice, and rectum                            were photographed. Scope In: 11:08:19 AM Scope Out: 11:25:51 AM Scope Withdrawal Time: 0 hours 10 minutes 1 second  Total Procedure Duration: 0 hours 17 minutes 32 seconds  Findings:                 The digital rectal exam findings include decreased                            sphincter tone.                           There is no endoscopic evidence of polyps in the  entire colon.                           Retroflexion in the rectum was not performed due to                            narrow anatomy and difficulty retaining air.                           A patchy area of mildly erythematous mucosa and a                            few diminutive scars were found in the distal                            rectum (residual from hemorrhoid surgery earlier                            this year).                           The exam was otherwise without abnormality. Complications:            No immediate complications. Estimated Blood Loss:     Estimated blood loss: none. Impression:               - Decreased sphincter tone found on digital rectal                            exam.                           - Erythematous mucosa in the distal rectum.                            Evidence of prior hemorrhoid surgery. No  clinical                            significance.                           - The examination was otherwise normal.                           - No specimens collected. Recommendation:           - Patient has a contact number available for                            emergencies. The signs and symptoms of potential                            delayed complications were discussed with the                            patient. Return to normal activities tomorrow.  Written discharge instructions were provided to the                            patient.                           - Resume previous diet.                           - Continue present medications.                           - No repeat routine colonoscopy due to age and                            current guidelines. Zhane Bluitt L. Loletha Carrow, MD 11/16/2020 11:32:19 AM This report has been signed electronically.

## 2020-11-16 NOTE — Progress Notes (Signed)
A/ox3, pleased with MAC, report to RN 

## 2020-11-16 NOTE — Progress Notes (Signed)
C.W. vital signs. 

## 2020-11-16 NOTE — Patient Instructions (Signed)
You may resume your previous diet and medication schedule.  Thank you for allowing [place] to care for you today!!!   YOU HAD AN ENDOSCOPIC PROCEDURE TODAY AT [facility]:   Refer to the procedure report that was given to you for any specific questions about what was found during the examination.  If the procedure report does not answer your questions, please call your gastroenterologist to clarify.  If you requested that your care partner not be given the details of your procedure findings, then the procedure report has been included in a sealed envelope for you to review at your convenience later.  YOU SHOULD EXPECT: Some feelings of bloating in the abdomen. Passage of more gas than usual.  Walking can help get rid of the air that was put into your GI tract during the procedure and reduce the bloating. If you had a lower endoscopy (such as a colonoscopy or flexible sigmoidoscopy) you may notice spotting of blood in your stool or on the toilet paper. If you underwent a bowel prep for your procedure, you may not have a normal bowel movement for a few days.  Please Note:  You might notice some irritation and congestion in your nose or some drainage.  This is from the oxygen used during your procedure.  There is no need for concern and it should clear up in a day or so.  SYMPTOMS TO REPORT IMMEDIATELY:   Following lower endoscopy (colonoscopy or flexible sigmoidoscopy):  Excessive amounts of blood in the stool  Significant tenderness or worsening of abdominal pains  Swelling of the abdomen that is new, acute  Fever of 100F or higher  For urgent or emergent issues, a gastroenterologist can be reached at any hour by calling [phone]. Do not use MyChart messaging for urgent concerns.    DIET:  We do recommend a small meal at first, but then you may proceed to your regular diet.  Drink plenty of fluids but you should avoid alcoholic beverages for 24 hours.  ACTIVITY:  You  should plan to take it easy for the rest of today and you should NOT DRIVE or use heavy machinery until tomorrow (because of the sedation medicines used during the test).    FOLLOW UP: Our staff will call the number listed on your records Wednesday morning between 7:15 am and 8:15 am to check on you and address any questions or concerns that you may have regarding the information given to you following your procedure. If we do not reach you, we will leave a message.  We will attempt to reach you two times.  During this call, we will ask if you have developed any symptoms of COVID 19. If you develop any symptoms (ie: fever, flu-like symptoms, shortness of breath, cough etc.) before then, please call [phone].  If you test positive for Covid 19 in the 2 weeks post procedure, please call and report this information to [place].    If any biopsies were taken you will be contacted by phone or by letter within the next 1-3 weeks.  Please call us at [phone] if you have not heard about the biopsies in 3 weeks.    SIGNATURES/CONFIDENTIALITY: You and/or your care partner have signed paperwork which will be entered into your electronic medical record.  These signatures attest to the fact that that the information above on your After Visit Summary has been reviewed and is understood.  Full responsibility of the confidentiality of this discharge information  lies with you and/or your care-partner.

## 2020-11-17 ENCOUNTER — Telehealth: Payer: Self-pay | Admitting: *Deleted

## 2020-11-17 NOTE — Telephone Encounter (Signed)
1. Have you developed a fever since your procedure? no  2.   Have you had an respiratory symptoms (SOB or cough) since your procedure? no  3.   Have you tested positive for COVID 19 since your procedure no  4.   Have you had any family members/close contacts diagnosed with the COVID 19 since your procedure?  no   If yes to any of these questions please route to Joylene John, RN and Joella Prince, RN Follow up Call-  Call back number 11/16/2020  Post procedure Call Back phone  # [phone]  Permission to leave phone message Yes  Some recent data might be hidden     Patient questions:  Do you have a fever, pain , or abdominal swelling? No. Pain Score  0 *  Have you tolerated food without any problems? Yes.    Have you been able to return to your normal activities? Yes.    Do you have any questions about your discharge instructions: Diet   No. Medications  No. Follow up visit  No.  Do you have questions or concerns about your Care? No.  Actions: * If pain score is 4 or above: No action needed, pain <4. 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 Pt will keep an eye on this and call us back if this site shows signs of phlebitis or infection or does not continue to get better.

## 2020-11-23 DIAGNOSIS — K1370 Unspecified lesions of oral mucosa: Secondary | ICD-10-CM | POA: Diagnosis not present

## 2020-11-27 DIAGNOSIS — M9901 Segmental and somatic dysfunction of cervical region: Secondary | ICD-10-CM | POA: Diagnosis not present

## 2020-11-27 DIAGNOSIS — M9902 Segmental and somatic dysfunction of thoracic region: Secondary | ICD-10-CM | POA: Diagnosis not present

## 2020-11-27 DIAGNOSIS — M791 Myalgia, unspecified site: Secondary | ICD-10-CM | POA: Diagnosis not present

## 2020-12-09 DIAGNOSIS — M5030 Other cervical disc degeneration, unspecified cervical region: Secondary | ICD-10-CM | POA: Diagnosis not present

## 2020-12-09 DIAGNOSIS — M5481 Occipital neuralgia: Secondary | ICD-10-CM | POA: Diagnosis not present

## 2020-12-09 DIAGNOSIS — M797 Fibromyalgia: Secondary | ICD-10-CM | POA: Diagnosis not present

## 2020-12-09 DIAGNOSIS — R6884 Jaw pain: Secondary | ICD-10-CM | POA: Diagnosis not present

## 2020-12-10 ENCOUNTER — Encounter: Payer: Self-pay | Admitting: Family Medicine

## 2020-12-10 ENCOUNTER — Ambulatory Visit: Payer: PPO | Admitting: Family Medicine

## 2020-12-10 VITALS — BP 127/81 | HR 76 | Ht 62.0 in | Wt 153.0 lb

## 2020-12-10 DIAGNOSIS — Z9989 Dependence on other enabling machines and devices: Secondary | ICD-10-CM | POA: Diagnosis not present

## 2020-12-10 DIAGNOSIS — G4733 Obstructive sleep apnea (adult) (pediatric): Secondary | ICD-10-CM

## 2020-12-10 NOTE — Progress Notes (Addendum)
Chief Complaint  Patient presents with  . Follow-up    Rm 2  . Sleep Apnea    Pt said there were a couple days she didn't use the cpap because she had a head cold. But otherwise she is doing well     HISTORY OF PRESENT ILLNESS: Today 12/10/20  Natalie Erickson is a 69 y.o. female here today for follow up for OSA on CPAP therapy.  She reports that she is doing very well in therapy.  She does feel that it helps with daytime sleepiness.  She is currently seeing [place] neurology, Dr. Sabra Heck, who is retiring at the end of the year.  She is treated with low-dose gabapentin for occipital neuralgia.  She reports that she is doing very well.  She would like to continue follow-up for both sleep and occipital neuralgia with me.  Compliance report dated 11/09/2020 through 12/08/2020 reveals that she used CPAP 28 of the past 30 days for compliance of 93%.  She used CPAP greater than 4 hours 27 of the past 30 days for compliance of 90%.  Average usage on days used was 6 hours and 55 minutes.  Residual AHI was 0.9 on 4 to 9 cm of water and an EPR of 1.  There was no significant leak noted.   HISTORY (copied from previous note)  Natalie Erickson is a 69 y.o. female here today for follow up of OSA on CPAP.  [name] is doing very well on CPAP therapy.  She is using CPAP nightly.  She does note significant improvement in daytime sleepiness.  She has less fatigue throughout the day.  She notes improvement in occipital neuralgia.  She is currently managed by Dr. Sabra Heck, [facility] neurology.  Her only concern is that occasionally she will wake up at night with what she describes as gas pains.  She is also noted belching.  She is currently using a nasal pillow.  She does have a chinstrap but has not used this.  She is concerned that pressure settings are too high.  Compliance report dated 11/10/2019 through 12/09/2019 revealed that she is using CPAP every night for compliance of 100%.  Every night she used CPAP  greater than 4 hours for compliance of 100%.  On average she is using CPAP 7 hours and 49 minutes.  Residual AHI 0.6 on 4 to 9 cm of water.  Pressure in the 95th percentile of 8.2 with maximum of 8.8.  There is no significant leak.  HISTORY: (copied from Dr Guadelupe Sabin note on 08/12/2019)  Dear Dr. Juleen China,   I saw your patient, Natalie Erickson, upon your kind request to my sleep clinic today for initial consultation of her sleep disorder, in particular, concern for sleep disordered breathing. The patient is unaccompanied today. As you know, [name] is a 69 year old right-handed woman with an underlying medical history of irritable bowel syndrome, history of heart murmur, fibromyalgia, depression, anxiety, and hyperlipidemia, who reports snoring and excessive daytime somnolence. I reviewed your office note from 07/12/2019. She has had some morning headaches. These are dull and achy. She had seen Dr. Jaynee Eagles in our office in 2018 for headaches. Her Epworth sleepiness score is 9 out of 24, fatigue severity score is 52 out of 63.  She reports that her husband has noted some snorting sounds, these became worse when she was tried on baclofen for neck pain. She had a recent C-spine MRI and x-ray in July 2020. She brought copies of the results  for my review. Her cervical spine x-ray showed multilevel degenerative disc disease. Her MRI neck showed multilevel cervical spondylosis, most pronounced at C5-6. She had mild canal stenosis and moderate left foraminal narrowing she was noted to have a goiter. She is going to see a new orthopedic doctor this week. She reports that she is supposed to have a thyroid biopsy. She has had some depressive symptoms. She reports side effects on antidepressant medications. She has intermittent restless leg symptoms and takes magnesium for these symptoms as well as cramping. She does not have nightly restless legs symptoms. She does have nocturia once or twice per average  night. She lives with her husband. They live with their son and his family including wife and 2 grandchildren. She has another son in [place]. Her son that lives here has sleep apnea and has a CPAP machine. She reports a bedtime around 930 and rise time between 630 and 7 AM. Years ago she tried a custom-made bite guard which broke. She did not think it helped. She does not believe it was made for snoring or for sleep apnea but more for grinding. She does not drink caffeine or alcohol. She does not smoke.    REVIEW OF SYSTEMS: Out of a complete 14 system review of symptoms, the patient complains only of the following symptoms, occipital neuralgia and all other reviewed systems are negative.  ESS: 4 FSS: 19   ALLERGIES: Allergies  Allergen Reactions  . Diltiazem Shortness Of Breath  . Metoprolol Shortness Of Breath  . Statins Tinitus    Achy joints, muscle aches Achy joints, muscle aches  . Chocolate Other (See Comments)    migraine  . Cocoa Other (See Comments)    migraine migraine  . Codeine Nausea And Vomiting  . Cranberry Other (See Comments)    Cystitis  . Latex Rash  . Monosodium Glutamate Other (See Comments)    Migraine  . Penicillins Rash    Has patient had a PCN reaction causing immediate rash, facial/tongue/throat swelling, SOB or lightheadedness with hypotension: Yes Has patient had a PCN reaction causing severe rash involving mucus membranes or skin necrosis: No Has patient had a PCN reaction that required hospitalization No Has patient had a PCN reaction occurring within the last 10 years: No If all of the above answers are "NO", then may proceed with Cephalosporin use.   Marland Kitchen Antihistamines, Loratadine-Type     Throat swelling  . Carvedilol Other (See Comments)    N&N, SOB , Head ache and joint pain.   . Ginger   . Gluten Meal   . Lexapro [Escitalopram]     GI   . Montelukast Other (See Comments)    Throat swelling  . Other Swelling    Throat  swelling  . Prednisone Swelling    Throat swelling (no difficulty breathing)  . Sulfites Other (See Comments)    Nausea and headaches  . Vanilla   . Whey   . Zofran [Ondansetron Hcl] Other (See Comments)    Muscle cramps, leg tingling  . Azithromycin Other (See Comments) and Diarrhea  . Eggs Or Egg-Derived Products Other (See Comments)    States exacerbates her IBS symptoms     HOME MEDICATIONS: Outpatient Medications Prior to Visit  Medication Sig Dispense Refill  . acetaminophen (TYLENOL) 500 MG tablet Take 1,000 mg by mouth every 6 (six) hours as needed for headache (pain).    . AMBULATORY NON FORMULARY MEDICATION Take 1 tablet by mouth as needed. Medication Name:  [employer]    . b complex vitamins capsule Take 1 capsule by mouth daily.    . Cholecalciferol (VITAMIN D) 2000 units tablet Take 2,000 Units by mouth daily.     Marland Kitchen gabapentin (NEURONTIN) 100 MG capsule Take 100 mg by mouth 3 (three) times daily.    . Magnesium Glycinate POWD 120 mg by Does not apply route as needed.     [name] (SYSTANE ULTRA OP) Apply to eye at bedtime.     . polyethylene glycol powder (GLYCOLAX/MIRALAX) 17 GM/SCOOP powder Take 17-34 g by mouth daily.    . Probiotic Product ([place]) Take by mouth daily as needed.     No facility-administered medications prior to visit.     PAST MEDICAL HISTORY: Past Medical History:  Diagnosis Date  . AC (acromioclavicular) joint bone spurs    c 5 and c6 and thoracic  . Allergy    food allergies  . Anxiety 04/06/2017  . 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 Cataract    bilateral  . Cystitis   . Depression   . Dry eyes 04/06/2017  . Elevated BP without diagnosis of hypertension 04/06/2017   lost weight >> BP improved  . Fibromyalgia   . Floaters    both eyes  . Heart murmur    hx of 2 Echocardiograms in [place] (pt was told they were normal)  . Hiatal hernia   . Hip pain    and groin pain for a while  .  History of colon polyps   . History of echocardiogram    Echo 9/19: mild LVH, EF 60-65, no RWMA, Gr 1 DD, trivial MR/TR, PASP 16  . History of kidney stones   . History of Lyme disease   . Hypercalcemia    causes kidney stones, none recent  . IBS (irritable bowel syndrome)   . Mixed hyperlipidemia 04/06/2017   intol to statins due to myalgias; never tried Zetia  . Occipital neuralgia   . Sleep apnea    mild to moderate , uses cpap  . TMJ (dislocation of temporomandibular joint)   . UTI (urinary tract infection) finishes antibiotic 03-27-2020  . Vasomotor rhinitis      PAST SURGICAL HISTORY: Past Surgical History:  Procedure Laterality Date  . APPENDECTOMY  1959  . COLONOSCOPY  2016   [place]  . COLONOSCOPY  2011   in [place]   . EVALUATION UNDER ANESTHESIA WITH HEMORRHOIDECTOMY N/A 04/02/2020   Procedure: ANORECTAL EXAM UNDER ANESTHESIA WITH HEMORRHOIDECTOMY, HEMORRHOIDAL LIGATION;  Surgeon: [name], MD;  Location: [facility];  Service: General;  Laterality: N/A;  GENERAL AND LOCAL  . left ovarin cyst removed  age 56's   grapefruit sized, exp laparotomy  . POLYPECTOMY    . UPPER GASTROINTESTINAL ENDOSCOPY    . UPPER GI ENDOSCOPY       FAMILY HISTORY: Family History  Problem Relation Age of Onset  . Healthy Mother   . Hypertension Mother   . Healthy Father   . Lung cancer Father   . Breast cancer Sister   . Diabetes Paternal Grandmother   . Colon cancer Neg Hx   . Stomach cancer Neg Hx   . Heart attack Neg Hx   . Heart failure Neg Hx   . Pancreatic cancer Neg Hx   . Colon polyps Neg Hx   . Esophageal cancer Neg Hx   . Rectal cancer Neg Hx      SOCIAL  HISTORY: Social History   Socioeconomic History  . Marital status: Married    Spouse name: Not on file  . Number of children: 2  . Years of education: Therapist, sports  . Highest education level: Not on file  Occupational History  . Occupation: Retired  Tobacco Use  . Smoking status: Never  Smoker  . Smokeless tobacco: Never Used  Vaping Use  . Vaping Use: Never used  Substance and Sexual Activity  . Alcohol use: No  . Drug use: No  . Sexual activity: Yes  Other Topics Concern  . 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   Generalized: Well developed, in no acute distress  Cardiology: normal rate and rhythm, no murmur auscultated  Respiratory: clear to auscultation bilaterally    Neurological examination  Mentation: Alert oriented to time, place, history taking. Follows all commands speech and language fluent Cranial nerve II-XII: Pupils were equal round reactive to light. Extraocular movements were full, visual field were full on confrontational test. Facial sensation and strength were normal. Head turning and shoulder shrug  were normal and symmetric. Motor: The motor testing reveals 5 over 5 strength of all 4 extremities. Good symmetric motor tone is noted throughout.  Gait and station: Gait is normal.     DIAGNOSTIC DATA (LABS, IMAGING, TESTING) - I reviewed patient records, labs, notes, testing and imaging myself where available.  Lab Results  Component Value Date   WBC 8.3 10/28/2020   HGB 14.1 10/28/2020   HCT 42.6 10/28/2020   MCV 94.2 10/28/2020   PLT 388 10/28/2020      Component Value Date/Time   NA 142 10/28/2020 0927   K 4.5 10/28/2020 0927    CL 103 10/28/2020 0927   CO2 27 10/28/2020 0927   GLUCOSE 89 10/28/2020 0927   BUN 19 10/28/2020 0927   CREATININE 0.70 10/28/2020 0927   CALCIUM 10.2 10/28/2020 0927   PROT 7.4 10/28/2020 0927   PROT 7.1 07/10/2017 1058   ALBUMIN 4.8 06/04/2019 0950   AST 17 10/28/2020 0927   ALT 11 10/28/2020 0927   ALKPHOS 128 (H) 06/04/2019 0950   BILITOT 0.6 10/28/2020 0927   GFRNONAA >60 08/28/2019 1846   GFRAA >60 08/28/2019 1846   Lab Results  Component Value Date   CHOL 260 (H) 10/28/2020   HDL 63 10/28/2020   LDLCALC 174 (H) 10/28/2020   TRIG 104 10/28/2020   CHOLHDL 4.1 10/28/2020   Lab Results  Component Value Date   HGBA1C 5.4 10/28/2020   Lab Results  Component Value Date   BFXOVANV91 660 10/28/2020   Lab Results  Component Value Date   TSH 0.64 10/28/2020    No flowsheet data found.   ASSESSMENT AND PLAN  69 y.o. year old female  has a past medical history of AC (acromioclavicular) joint bone spurs, Allergy, Anxiety (04/06/2017), Benign thyroid cyst (sept 2020 dx), Cataract, Cystitis, Depression, Dry eyes (04/06/2017), Elevated BP without diagnosis of hypertension (04/06/2017), Fibromyalgia, Floaters, Heart murmur, Hiatal hernia, Hip pain, History of colon polyps, History of echocardiogram, History of kidney stones, History of Lyme disease, Hypercalcemia, IBS (irritable bowel syndrome), Mixed hyperlipidemia (04/06/2017), Occipital neuralgia, Sleep apnea, TMJ (dislocation of temporomandibular joint), UTI (urinary tract infection) (finishes antibiotic 03-27-2020), and Vasomotor rhinitis. here with   OSA on CPAP  Einar Pheasant is doing very well from a sleep apnea standpoint.  Compliance report reveals excellent compliance.  She was encouraged to continue using CPAP nightly and for greater than 4 hours each night.  She was last seen by Dr. Lavell Anchors for occipital neuralgia in July 2018.  Patient has 1 follow-up remaining with Dr. Sabra Heck before his retirement.  I do not suspect there will  be any concerns of her continuing to follow-up with [place] for both sleep apnea and occipital neuralgia.  We will see her back in 1 year, sooner if needed.  She verbalizes understanding and agreement with this plan.    No orders of the defined types were placed in this encounter.     I spent 20 minutes of face-to-face and non-face-to-face time with patient.  This included previsit chart review, lab review, study review, order entry, electronic health record documentation, patient education.    Debbora Presto, MSN, FNP-C 12/10/2020, 11:02 AM  [facility] [address] [place],  [address] [phone]  I reviewed the above note and documentation by the Nurse Practitioner and agree with the history, exam, assessment and plan as outlined above. I was available for consultation. Star Age, MD, PhD [facility])

## 2020-12-10 NOTE — Patient Instructions (Signed)
Please continue using your CPAP regularly. While your insurance requires that you use CPAP at least 4 hours each night on 70% of the nights, I recommend, that you not skip any nights and use it throughout the night if you can. Getting used to CPAP and staying with the treatment long term does take time and patience and discipline. Untreated obstructive sleep apnea when it is moderate to severe can have an adverse impact on cardiovascular health and raise her risk for heart disease, arrhythmias, hypertension, congestive heart failure, stroke and diabetes. Untreated obstructive sleep apnea causes sleep disruption, nonrestorative sleep, and sleep deprivation. This can have an impact on your day to day functioning and cause daytime sleepiness and impairment of cognitive function, memory loss, mood disturbance, and problems focussing. Using CPAP regularly can improve these symptoms.   We will see you back in 1 year   Sleep Apnea Sleep apnea affects breathing during sleep. It causes breathing to stop for a short time or to become shallow. It can also increase the risk of:  Heart attack.  Stroke.  Being very overweight (obese).  Diabetes.  Heart failure.  Irregular heartbeat. The goal of treatment is to help you breathe normally again. What are the causes? There are three kinds of sleep apnea:  Obstructive sleep apnea. This is caused by a blocked or collapsed airway.  Central sleep apnea. This happens when the brain does not send the right signals to the muscles that control breathing.  Mixed sleep apnea. This is a combination of obstructive and central sleep apnea. The most common cause of this condition is a collapsed or blocked airway. This can happen if:  Your throat muscles are too relaxed.  Your tongue and tonsils are too large.  You are overweight.  Your airway is too small. What increases the risk?  Being overweight.  Smoking.  Having a small airway.  Being  older.  Being female.  Drinking alcohol.  Taking medicines to calm yourself (sedatives or tranquilizers).  Having family members with the condition. What are the signs or symptoms?  Trouble staying asleep.  Being sleepy or tired during the day.  Getting angry a lot.  Loud snoring.  Headaches in the morning.  Not being able to focus your mind (concentrate).  Forgetting things.  Less interest in sex.  Mood swings.  Personality changes.  Feelings of sadness (depression).  Waking up a lot during the night to pee (urinate).  Dry mouth.  Sore throat. How is this diagnosed?  Your medical history.  A physical exam.  A test that is done when you are sleeping (sleep study). The test is most often done in a sleep lab but may also be done at home. How is this treated?   Sleeping on your side.  Using a medicine to get rid of mucus in your nose (decongestant).  Avoiding the use of alcohol, medicines to help you relax, or certain pain medicines (narcotics).  Losing weight, if needed.  Changing your diet.  Not smoking.  Using a machine to open your airway while you sleep, such as: ? An oral appliance. This is a mouthpiece that shifts your lower jaw forward. ? A CPAP device. This device blows air through a mask when you breathe out (exhale). ? An EPAP device. This has valves that you put in each nostril. ? A BPAP device. This device blows air through a mask when you breathe in (inhale) and breathe out.  Having surgery if other treatments do not  work. It is important to get treatment for sleep apnea. Without treatment, it can lead to:  High blood pressure.  Coronary artery disease.  In men, not being able to have an erection (impotence).  Reduced thinking ability. Follow these instructions at home: Lifestyle  Make changes that your doctor recommends.  Eat a healthy diet.  Lose weight if needed.  Avoid alcohol, medicines to help you relax, and some pain  medicines.  Do not use any products that contain nicotine or tobacco, such as cigarettes, e-cigarettes, and chewing tobacco. If you need help quitting, ask your doctor. General instructions  Take over-the-counter and prescription medicines only as told by your doctor.  If you were given a machine to use while you sleep, use it only as told by your doctor.  If you are having surgery, make sure to tell your doctor you have sleep apnea. You may need to bring your device with you.  Keep all follow-up visits as told by your doctor. This is important. Contact a doctor if:  The machine that you were given to use during sleep bothers you or does not seem to be working.  You do not get better.  You get worse. Get help right away if:  Your chest hurts.  You have trouble breathing in enough air.  You have an uncomfortable feeling in your back, arms, or stomach.  You have trouble talking.  One side of your body feels weak.  A part of your face is hanging down. These symptoms may be an emergency. Do not wait to see if the symptoms will go away. Get medical help right away. Call your local emergency services (911 in the [place] Do not drive yourself to the hospital. Summary  This condition affects breathing during sleep.  The most common cause is a collapsed or blocked airway.  The goal of treatment is to help you breathe normally while you sleep. This information is not intended to replace advice given to you by your health care provider. Make sure you discuss any questions you have with your health care provider. Document Revised: 09/28/2018 Document Reviewed: 08/07/2018 Elsevier Patient Education  2020 Elsevier Inc.  

## 2020-12-29 DIAGNOSIS — M791 Myalgia, unspecified site: Secondary | ICD-10-CM | POA: Diagnosis not present

## 2020-12-29 DIAGNOSIS — M9901 Segmental and somatic dysfunction of cervical region: Secondary | ICD-10-CM | POA: Diagnosis not present

## 2020-12-29 DIAGNOSIS — M9902 Segmental and somatic dysfunction of thoracic region: Secondary | ICD-10-CM | POA: Diagnosis not present

## 2021-01-18 DIAGNOSIS — G4733 Obstructive sleep apnea (adult) (pediatric): Secondary | ICD-10-CM | POA: Diagnosis not present

## 2021-01-21 DIAGNOSIS — D1801 Hemangioma of skin and subcutaneous tissue: Secondary | ICD-10-CM | POA: Diagnosis not present

## 2021-01-21 DIAGNOSIS — I878 Other specified disorders of veins: Secondary | ICD-10-CM | POA: Diagnosis not present

## 2021-02-09 DIAGNOSIS — M9901 Segmental and somatic dysfunction of cervical region: Secondary | ICD-10-CM | POA: Diagnosis not present

## 2021-02-09 DIAGNOSIS — M791 Myalgia, unspecified site: Secondary | ICD-10-CM | POA: Diagnosis not present

## 2021-02-09 DIAGNOSIS — M9902 Segmental and somatic dysfunction of thoracic region: Secondary | ICD-10-CM | POA: Diagnosis not present

## 2021-02-16 ENCOUNTER — Other Ambulatory Visit: Payer: Self-pay | Admitting: Family Medicine

## 2021-02-16 DIAGNOSIS — Z1231 Encounter for screening mammogram for malignant neoplasm of breast: Secondary | ICD-10-CM

## 2021-03-02 DIAGNOSIS — M9901 Segmental and somatic dysfunction of cervical region: Secondary | ICD-10-CM | POA: Diagnosis not present

## 2021-03-02 DIAGNOSIS — M9902 Segmental and somatic dysfunction of thoracic region: Secondary | ICD-10-CM | POA: Diagnosis not present

## 2021-03-02 DIAGNOSIS — M791 Myalgia, unspecified site: Secondary | ICD-10-CM | POA: Diagnosis not present

## 2021-03-16 DIAGNOSIS — M9901 Segmental and somatic dysfunction of cervical region: Secondary | ICD-10-CM | POA: Diagnosis not present

## 2021-03-16 DIAGNOSIS — M9902 Segmental and somatic dysfunction of thoracic region: Secondary | ICD-10-CM | POA: Diagnosis not present

## 2021-03-16 DIAGNOSIS — M791 Myalgia, unspecified site: Secondary | ICD-10-CM | POA: Diagnosis not present

## 2021-04-06 DIAGNOSIS — M9902 Segmental and somatic dysfunction of thoracic region: Secondary | ICD-10-CM | POA: Diagnosis not present

## 2021-04-06 DIAGNOSIS — M9901 Segmental and somatic dysfunction of cervical region: Secondary | ICD-10-CM | POA: Diagnosis not present

## 2021-04-06 DIAGNOSIS — M791 Myalgia, unspecified site: Secondary | ICD-10-CM | POA: Diagnosis not present

## 2021-04-07 ENCOUNTER — Other Ambulatory Visit: Payer: Self-pay

## 2021-04-07 ENCOUNTER — Ambulatory Visit
Admission: RE | Admit: 2021-04-07 | Discharge: 2021-04-07 | Disposition: A | Discharge status: Home / Self Care | Payer: PPO | Source: Ambulatory Visit | Attending: Family Medicine | Admitting: Family Medicine

## 2021-04-07 DIAGNOSIS — Z1231 Encounter for screening mammogram for malignant neoplasm of breast: Secondary | ICD-10-CM

## 2021-04-12 ENCOUNTER — Ambulatory Visit (INDEPENDENT_AMBULATORY_CARE_PROVIDER_SITE_OTHER): Payer: PPO | Admitting: Family

## 2021-04-12 ENCOUNTER — Other Ambulatory Visit: Payer: Self-pay

## 2021-04-12 VITALS — BP 128/86 | HR 76 | Temp 98.3°F | Ht 62.0 in | Wt 157.0 lb

## 2021-04-12 DIAGNOSIS — R300 Dysuria: Secondary | ICD-10-CM | POA: Diagnosis not present

## 2021-04-12 LAB — POCT URINALYSIS DIP (MANUAL ENTRY)
Bilirubin, UA: NEGATIVE
Glucose, UA: NEGATIVE mg/dL
Ketones, POC UA: NEGATIVE mg/dL
Leukocytes, UA: NEGATIVE
Nitrite, UA: NEGATIVE
Protein Ur, POC: NEGATIVE mg/dL
Spec Grav, UA: 1.01 (ref 1.010–1.025)
Urobilinogen, UA: 0.2 E.U./dL
pH, UA: 6 (ref 5.0–8.0)

## 2021-04-12 MED ORDER — SULFAMETHOXAZOLE-TRIMETHOPRIM 800-160 MG PO TABS: 1.0000 | ORAL_TABLET | Freq: Two times a day (BID) | ORAL | 0 refills | Status: DC

## 2021-04-12 MED ORDER — NITROFURANTOIN MONOHYD MACRO 100 MG PO CAPS: 100.0000 mg | ORAL_CAPSULE | Freq: Two times a day (BID) | ORAL | 0 refills | Status: DC

## 2021-04-12 NOTE — Progress Notes (Signed)
Natalie Erickson is a 70 y.o. female with the following history as recorded in EpicCare:  Patient Active Problem List   Diagnosis Date Noted  . Depression, recurrent ([place]) 10/28/2020  . Aortic atherosclerosis ([place]) 10/28/2020  . Statin intolerance 10/28/2020  . OSA on CPAP 10/28/2020  . Multiple thyroid nodules 10/28/2020  . Vitamin D deficiency 10/28/2020  . B12 deficiency 10/28/2020  . DDD (degenerative disc disease), cervical 01/08/2020  . Bilateral occipital neuralgia 01/08/2020  . 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 Posterior vitreous detachment of right eye 09/10/2018  . History of IBS 01/30/2018  . Fibromyalgia 06/04/2017  . Chronic fatigue 06/04/2017  . ANA positive 05/20/2017  . 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 Interstitial cystitis 05/20/2017  . Dyspepsia 05/20/2017  . Mixed hyperlipidemia 04/06/2017  . Anxiety 04/06/2017    Current Outpatient Medications  Medication Sig Dispense Refill  . acetaminophen (TYLENOL) 500 MG tablet Take 1,000 mg by mouth every 6 (six) hours as needed for headache (pain).    . AMBULATORY NON FORMULARY MEDICATION Take 1 tablet by mouth as needed. Medication Name: [name]    . b complex vitamins capsule Take 1 capsule by mouth daily.    . Cholecalciferol (VITAMIN D) 2000 units tablet Take 2,000 Units by mouth daily.     . Magnesium Glycinate POWD 120 mg by Does not apply route as needed.     Marland Kitchen OLIVE LEAF EXTRACT PO Take by mouth.    [name] (SYSTANE ULTRA OP) Apply to eye at bedtime.     . polyethylene glycol powder (GLYCOLAX/MIRALAX) 17 GM/SCOOP powder Take 17-34 g by mouth daily.    . Probiotic Product ([place]) Take by mouth daily as needed.    . sulfamethoxazole-trimethoprim (BACTRIM DS) 800-160 MG tablet Take 1 tablet by mouth 2 (two) times daily. 10 tablet 0  . gabapentin (NEURONTIN) 100 MG capsule Take 100 mg by mouth 3 (three) times daily. (Patient not taking: Reported on 04/12/2021)      No current facility-administered medications for this visit.    Allergies: Diltiazem; Metoprolol; Statins; Chocolate; Cocoa; Codeine; Cranberry; Latex; Monosodium glutamate; Penicillins; Antihistamines, loratadine-type; Carvedilol; Ginger; Gluten meal; Lexapro [escitalopram]; Montelukast; Other; Prednisone; Sulfites; Vanilla; Whey; Zofran [ondansetron hcl]; Azithromycin; and Eggs or egg-derived products  Past Medical History:  Diagnosis Date  . AC (acromioclavicular) joint bone spurs    c 5 and c6 and thoracic  . Allergy    food allergies  . Anxiety 04/06/2017  . 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 Cataract    bilateral  . Cystitis   . Depression   . Dry eyes 04/06/2017  . Elevated BP without diagnosis of hypertension 04/06/2017   lost weight >> BP improved  . Fibromyalgia   . Floaters    both eyes  . Heart murmur    hx of 2 Echocardiograms in [place] (pt was told they were normal)  . Hiatal hernia   . Hip pain    and groin pain for a while  . History of colon polyps   . History of echocardiogram    Echo 9/19: mild LVH, EF 60-65, no RWMA, Gr 1 DD, trivial MR/TR, PASP 16  . History of kidney stones   . History of Lyme disease   . Hypercalcemia    causes kidney stones, none recent  . IBS (irritable bowel syndrome)   . Mixed hyperlipidemia 04/06/2017   intol to statins due to myalgias; never tried Zetia  . Occipital neuralgia   .  Sleep apnea    mild to moderate , uses cpap  . TMJ (dislocation of temporomandibular joint)   . UTI (urinary tract infection) finishes antibiotic 03-27-2020  . Vasomotor rhinitis     Past Surgical History:  Procedure Laterality Date  . APPENDECTOMY  1959  . COLONOSCOPY  2016   [place]  . COLONOSCOPY  2011   in [place]   . EVALUATION UNDER ANESTHESIA WITH HEMORRHOIDECTOMY N/A 04/02/2020   Procedure: ANORECTAL EXAM UNDER ANESTHESIA WITH HEMORRHOIDECTOMY, HEMORRHOIDAL LIGATION;  Surgeon: [name], MD;  Location: [facility];  Service: General;  Laterality: N/A;  GENERAL AND LOCAL  . left ovarin cyst removed  age 2's   grapefruit sized, exp laparotomy  . POLYPECTOMY    . UPPER GASTROINTESTINAL ENDOSCOPY    . UPPER GI ENDOSCOPY      Family History  Problem Relation Age of Onset  . Healthy Mother   . Hypertension Mother   . Healthy Father   . Lung cancer Father   . Breast cancer Sister   . Diabetes Paternal Grandmother   . Colon cancer Neg Hx   . Stomach cancer Neg Hx   . Heart attack Neg Hx   . Heart failure Neg Hx   . Pancreatic cancer Neg Hx   . Colon polyps Neg Hx   . Esophageal cancer Neg Hx   . Rectal cancer Neg Hx     Social History   Tobacco Use  . Smoking status: Never Smoker  . Smokeless tobacco: Never Used  Substance Use Topics  . Alcohol use: No    Subjective:  Presents with concerns for possible UTI; does get these occasionally- has been having some increased problems hemorrhoids/ diarrhea; feels like episode of diarrhea last week was source of symptoms; + burning, urgency, frequency; some relief with OTC supplement;  Does have topical premarin- admits has not been using regularly;  Patient is asking for prescription for Bactrim DS today- numerous allergies and knows that she can tolerate this medication;   Objective:  Vitals:   04/12/21 1326  BP: 128/86  Pulse: 76  Temp: 98.3 F (36.8 C)  TempSrc: Oral  SpO2: 99%  Weight: 157 lb (71.2 kg)  Height: 5\' 2"  (1.575 m)    General: Well developed, well nourished, in no acute distress  Skin : Warm and dry.  Head: Normocephalic and atraumatic  Lungs: Respirations unlabored; clear to auscultation bilaterally without wheeze, rales, rhonchi  CVS exam: normal rate and regular rhythm.  Neurologic: Alert and oriented; speech intact; face symmetrical; moves all extremities well; CNII-XII intact without focal deficit    Assessment:  1. Dysuria     Plan:  Check U/A and urine culture; Rx for Bactrim DS bid x 5 days;  increase fluids, rest; Plan to follow up with her PCP with continued concerns or questions;  This visit occurred during the SARS-CoV-2 public health emergency.  Safety protocols were in place, including screening questions prior to the visit, additional usage of staff PPE, and extensive cleaning of exam room while observing appropriate contact time as indicated for disinfecting solutions.     No follow-ups on file.  Orders Placed This Encounter  Procedures  . Urine Culture  . POCT urinalysis dipstick    Requested Prescriptions   Signed Prescriptions Disp Refills  . sulfamethoxazole-trimethoprim (BACTRIM DS) 800-160 MG tablet 10 tablet 0    Sig: Take 1 tablet by mouth 2 (two) times daily.

## 2021-04-14 ENCOUNTER — Telehealth: Payer: Self-pay

## 2021-04-14 NOTE — Telephone Encounter (Signed)
I have called pt to inform her to drop of a new urine sample since the one she left has been insufficient/compromised. There was no answer so I left a message to call back.  Also wanted to see how she was doing.

## 2021-04-19 ENCOUNTER — Other Ambulatory Visit: Payer: Self-pay

## 2021-04-19 ENCOUNTER — Other Ambulatory Visit (INDEPENDENT_AMBULATORY_CARE_PROVIDER_SITE_OTHER): Payer: PPO

## 2021-04-19 ENCOUNTER — Telehealth: Payer: Self-pay | Admitting: Family

## 2021-04-19 DIAGNOSIS — G4733 Obstructive sleep apnea (adult) (pediatric): Secondary | ICD-10-CM | POA: Diagnosis not present

## 2021-04-19 DIAGNOSIS — R300 Dysuria: Secondary | ICD-10-CM

## 2021-04-19 NOTE — Telephone Encounter (Addendum)
The pt has given [place] a call back stating that she is going to leave a sample at her primary care office. Pt reports that she is doing better but still having the discomfort. Pt stated understanding and will make another appointment to follow up with her own PCP.

## 2021-04-19 NOTE — Telephone Encounter (Signed)
Please let her know there was a lab error and her urine culture did not get processed. If she is still having symptoms, would recommend that she follow-up with her PCP for re-check.

## 2021-04-19 NOTE — Telephone Encounter (Signed)
I have attempted to call pt once again to see how she was doing. No answer and unable to leave VM.   I wanted to relay provider message no answer.

## 2021-04-19 NOTE — Addendum Note (Signed)
Addended by: Doran Clay A on: 04/19/2021 03:58 PM   Modules accepted: Orders

## 2021-04-20 LAB — URINE CULTURE
MICRO NUMBER:: 11809622
Result:: NO GROWTH
SPECIMEN QUALITY:: ADEQUATE

## 2021-04-27 DIAGNOSIS — M9902 Segmental and somatic dysfunction of thoracic region: Secondary | ICD-10-CM | POA: Diagnosis not present

## 2021-04-27 DIAGNOSIS — M791 Myalgia, unspecified site: Secondary | ICD-10-CM | POA: Diagnosis not present

## 2021-04-27 DIAGNOSIS — M9901 Segmental and somatic dysfunction of cervical region: Secondary | ICD-10-CM | POA: Diagnosis not present

## 2021-05-18 DIAGNOSIS — M791 Myalgia, unspecified site: Secondary | ICD-10-CM | POA: Diagnosis not present

## 2021-05-18 DIAGNOSIS — M9902 Segmental and somatic dysfunction of thoracic region: Secondary | ICD-10-CM | POA: Diagnosis not present

## 2021-05-18 DIAGNOSIS — M9901 Segmental and somatic dysfunction of cervical region: Secondary | ICD-10-CM | POA: Diagnosis not present

## 2021-06-09 DIAGNOSIS — M5030 Other cervical disc degeneration, unspecified cervical region: Secondary | ICD-10-CM | POA: Diagnosis not present

## 2021-06-09 DIAGNOSIS — G4733 Obstructive sleep apnea (adult) (pediatric): Secondary | ICD-10-CM | POA: Diagnosis not present

## 2021-06-09 DIAGNOSIS — M797 Fibromyalgia: Secondary | ICD-10-CM | POA: Diagnosis not present

## 2021-06-09 DIAGNOSIS — M5481 Occipital neuralgia: Secondary | ICD-10-CM | POA: Diagnosis not present

## 2021-06-09 DIAGNOSIS — Z9989 Dependence on other enabling machines and devices: Secondary | ICD-10-CM | POA: Diagnosis not present

## 2021-06-29 DIAGNOSIS — M791 Myalgia, unspecified site: Secondary | ICD-10-CM | POA: Diagnosis not present

## 2021-06-29 DIAGNOSIS — M9902 Segmental and somatic dysfunction of thoracic region: Secondary | ICD-10-CM | POA: Diagnosis not present

## 2021-06-29 DIAGNOSIS — M9901 Segmental and somatic dysfunction of cervical region: Secondary | ICD-10-CM | POA: Diagnosis not present

## 2021-07-19 DIAGNOSIS — G4733 Obstructive sleep apnea (adult) (pediatric): Secondary | ICD-10-CM | POA: Diagnosis not present

## 2021-07-23 ENCOUNTER — Telehealth: Payer: PPO | Admitting: Family

## 2021-07-23 DIAGNOSIS — R399 Unspecified symptoms and signs involving the genitourinary system: Secondary | ICD-10-CM

## 2021-07-23 MED ORDER — NITROFURANTOIN MONOHYD MACRO 100 MG PO CAPS: 100.0000 mg | ORAL_CAPSULE | Freq: Two times a day (BID) | ORAL | 0 refills | Status: DC

## 2021-07-23 NOTE — Progress Notes (Signed)
E-Visit for Urinary Problems  We are sorry that you are not feeling well.  Here is how we plan to help!  Based on what you shared with me it looks like you most likely have a simple urinary tract infection.  A UTI (Urinary Tract Infection) is a bacterial infection of the bladder.  Most cases of urinary tract infections are simple to treat but a key part of your care is to encourage you to drink plenty of fluids and watch your symptoms carefully.  I have prescribed MacroBid 100 mg twice a day for 5 days.  Your symptoms should gradually improve. Call us if the burning in your urine worsens, you develop worsening fever, back pain or pelvic pain or if your symptoms do not resolve after completing the antibiotic.  Urinary tract infections can be prevented by drinking plenty of water to keep your body hydrated.  Also be sure when you wipe, wipe from front to back and don't hold it in!  If possible, empty your bladder every 4 hours.  HOME CARE Drink plenty of fluids Compete the full course of the antibiotics even if the symptoms resolve Remember, when you need to go.go. Holding in your urine can increase the likelihood of getting a UTI! GET HELP RIGHT AWAY IF: You cannot urinate You get a high fever Worsening back pain occurs You see blood in your urine You feel sick to your stomach or throw up You feel like you are going to pass out  MAKE SURE YOU  Understand these instructions. Will watch your condition. Will get help right away if you are not doing well or get worse.   Thank you for choosing an e-visit.  Your e-visit answers were reviewed by a board certified advanced clinical practitioner to complete your personal care plan. Depending upon the condition, your plan could have included both over the counter or prescription medications.  Please review your pharmacy choice. Make sure the pharmacy is open so you can pick up prescription now. If there is a problem, you may contact your  provider through [employer] and have the prescription routed to another pharmacy.  Your safety is important to [place]. If you have drug allergies check your prescription carefully.   For the next 24 hours you can use MyChart to ask questions about today's visit, request a non-urgent call back, or ask for a work or school excuse. You will get an email in the next two days asking about your experience. I hope that your e-visit has been valuable and will speed your recovery.  Approximately 5 minutes was spent documenting and reviewing patient's chart.

## 2021-08-05 ENCOUNTER — Encounter: Payer: Self-pay | Admitting: Physician Assistant

## 2021-08-05 ENCOUNTER — Other Ambulatory Visit: Payer: Self-pay

## 2021-08-05 ENCOUNTER — Ambulatory Visit (INDEPENDENT_AMBULATORY_CARE_PROVIDER_SITE_OTHER): Payer: PPO | Admitting: Physician Assistant

## 2021-08-05 VITALS — BP 140/80 | HR 80 | Temp 98.2°F | Ht 62.0 in | Wt 155.5 lb

## 2021-08-05 DIAGNOSIS — R300 Dysuria: Secondary | ICD-10-CM | POA: Diagnosis not present

## 2021-08-05 DIAGNOSIS — N8189 Other female genital prolapse: Secondary | ICD-10-CM

## 2021-08-05 LAB — POCT URINALYSIS DIPSTICK
Bilirubin, UA: NEGATIVE
Blood, UA: POSITIVE
Glucose, UA: NEGATIVE
Ketones, UA: NEGATIVE
Leukocytes, UA: NEGATIVE
Nitrite, UA: NEGATIVE
Protein, UA: NEGATIVE
Spec Grav, UA: 1.015 (ref 1.010–1.025)
Urobilinogen, UA: 0.2 E.U./dL
pH, UA: 6 (ref 5.0–8.0)

## 2021-08-05 MED ORDER — SULFAMETHOXAZOLE-TRIMETHOPRIM 800-160 MG PO TABS: 1.0000 | ORAL_TABLET | Freq: Two times a day (BID) | ORAL | 0 refills | Status: DC

## 2021-08-05 NOTE — Progress Notes (Signed)
Natalie Erickson is a 70 y.o. female here for a recurrence of a previously resolved problem.  I acted as a Education administrator for [employer], PA-C [employer], LPN   History of Present Illness:   Chief Complaint  Patient presents with   Dysuria     HPI  Dysuria Pt c/o burning with urination x 1 week. Pt was prescribed Nitrofurantoin 100 mg BID x 5 days while on vacation. She did feel improved from this medication. Symptoms started again. Denies flank pain, n/v. Did take AZO last night.  She reports that she has had these issues ever since her hemorrhoidectomy. Has leakage of stool 2/2 this infection.  Would like to return to pelvic floor physical therapy. Did this with Malachy Mood at [facility].    Past Medical History:  Diagnosis Date   AC (acromioclavicular) joint bone spurs    c 5 and c6 and thoracic   Allergy    food allergies   Anxiety 04/06/2017   Benign thyroid cyst sept 2020 dx   Cataract    bilateral   Cystitis    Depression    Dry eyes 04/06/2017   Elevated BP without diagnosis of hypertension 04/06/2017   lost weight >> BP improved   Fibromyalgia    Floaters    both eyes   Heart murmur    hx of 2 Echocardiograms in [place] (pt was told they were normal)   Hiatal hernia    Hip pain    and groin pain for a while   History of colon polyps    History of echocardiogram    Echo 9/19: mild LVH, EF 60-65, no RWMA, Gr 1 DD, trivial MR/TR, PASP 16   History of kidney stones    History of Lyme disease    Hypercalcemia    causes kidney stones, none recent   IBS (irritable bowel syndrome)    Mixed hyperlipidemia 04/06/2017   intol to statins due to myalgias; never tried Zetia   Occipital neuralgia    Sleep apnea    mild to moderate , uses cpap   TMJ (dislocation of temporomandibular joint)    UTI (urinary tract infection) finishes antibiotic 03-27-2020   Vasomotor rhinitis      Social History   Tobacco Use   Smoking status: Never   Smokeless  tobacco: Never  Vaping Use   Vaping Use: Never used  Substance Use Topics   Alcohol use: No   Drug use: No    Past Surgical History:  Procedure Laterality Date   APPENDECTOMY  1959   COLONOSCOPY  2016   [place]   COLONOSCOPY  2011   in [place] ANESTHESIA WITH HEMORRHOIDECTOMY N/A 04/02/2020   Procedure: ANORECTAL EXAM UNDER ANESTHESIA WITH HEMORRHOIDECTOMY, HEMORRHOIDAL LIGATION;  Surgeon: [name], MD;  Location: [facility];  Service: General;  Laterality: N/A;  GENERAL AND LOCAL   left ovarin cyst removed  age 63's   grapefruit sized, exp laparotomy   POLYPECTOMY     UPPER GASTROINTESTINAL ENDOSCOPY     UPPER GI ENDOSCOPY      Family History  Problem Relation Age of Onset   Healthy Mother    Hypertension Mother    Healthy Father    Lung cancer Father    Breast cancer Sister    Diabetes Paternal Grandmother    Colon cancer Neg Hx    Stomach cancer Neg Hx    Heart attack Neg Hx    Heart  failure Neg Hx    Pancreatic cancer Neg Hx    Colon polyps Neg Hx    Esophageal cancer Neg Hx    Rectal cancer Neg Hx     Allergies  Allergen Reactions   Diltiazem Shortness Of Breath   Metoprolol Shortness Of Breath   Statins Tinitus    Achy joints, muscle aches Achy joints, muscle aches   Chocolate Other (See Comments)    migraine   Cocoa Other (See Comments)    migraine migraine   Codeine Nausea And Vomiting   Cranberry Other (See Comments)    Cystitis   Latex Rash   Monosodium Glutamate Other (See Comments)    Migraine   Penicillins Rash    Has patient had a PCN reaction causing immediate rash, facial/tongue/throat swelling, SOB or lightheadedness with hypotension: Yes Has patient had a PCN reaction causing severe rash involving mucus membranes or skin necrosis: No Has patient had a PCN reaction that required hospitalization No Has patient had a PCN reaction occurring within the last 10 years: No If all of the above  answers are "NO", then may proceed with Cephalosporin use.    Antihistamines, Loratadine-Type     Throat swelling   Carvedilol Other (See Comments)    N&N, SOB , Head ache and joint pain.    Ginger    Gluten Meal    Lexapro [Escitalopram]     GI    Montelukast Other (See Comments)    Throat swelling   Other Swelling    Throat swelling   Prednisone Swelling    Throat swelling (no difficulty breathing)   Sulfites Other (See Comments)    Nausea and headaches   Vanilla    Whey    Zofran [Ondansetron Hcl] Other (See Comments)    Muscle cramps, leg tingling   Azithromycin Other (See Comments) and Diarrhea   Eggs Or Egg-Derived Products Other (See Comments)    States exacerbates her IBS symptoms    Current Medications:   Current Outpatient Medications:    acetaminophen (TYLENOL) 500 MG tablet, Take 1,000 mg by mouth every 6 (six) hours as needed for headache (pain)., Disp: , Rfl:    AMBULATORY NON FORMULARY MEDICATION, Take 1 tablet by mouth as needed. Medication Name: [facility], Disp: , Rfl:    Cholecalciferol (VITAMIN D) 2000 units tablet, Take 2,000 Units by mouth daily. , Disp: , Rfl:    Magnesium Glycinate POWD, 120 mg by Does not apply route as needed. , Disp: , Rfl:    OLIVE LEAF EXTRACT PO, Take by mouth., Disp: , Rfl:    Polyethyl Glycol-Propyl Glycol (SYSTANE ULTRA OP), Apply to eye at bedtime. , Disp: , Rfl:    Probiotic Product ([place]), Take by mouth daily as needed., Disp: , Rfl:    sulfamethoxazole-trimethoprim (BACTRIM DS) 800-160 MG tablet, Take 1 tablet by mouth 2 (two) times daily., Disp: 10 tablet, Rfl: 0   polyethylene glycol powder (GLYCOLAX/MIRALAX) 17 GM/SCOOP powder, Take 17-34 g by mouth daily. (Patient not taking: Reported on 08/05/2021), Disp: , Rfl:    Review of Systems:   ROS Negative unless otherwise specified per HPI.  Vitals:   Vitals:   08/05/21 1138  BP: 140/80  Pulse: 80  Temp: 98.2 F (36.8 C)  TempSrc: Temporal   SpO2: 95%  Weight: 155 lb 8 oz (70.5 kg)  Height: '5\' 2"'$  (1.575 m)     Body mass index is 28.44 kg/m.  Physical Exam:   Physical Exam  Vitals and nursing note reviewed.  Constitutional:      General: She is not in acute distress.    Appearance: She is well-developed. She is not ill-appearing or toxic-appearing.  Cardiovascular:     Rate and Rhythm: Normal rate and regular rhythm.     Pulses: Normal pulses.     Heart sounds: Normal heart sounds, S1 normal and S2 normal.     Comments: No LE edema Pulmonary:     Effort: Pulmonary effort is normal.     Breath sounds: Normal breath sounds.  Abdominal:     Tenderness: There is no right CVA tenderness or left CVA tenderness.  Skin:    General: Skin is warm and dry.  Neurological:     Mental Status: She is alert.     GCS: GCS eye subscore is 4. GCS verbal subscore is 5. GCS motor subscore is 6.  Psychiatric:        Speech: Speech normal.        Behavior: Behavior normal. Behavior is cooperative.    Results for orders placed or performed in visit on 08/05/21  POCT urinalysis dipstick  Result Value Ref Range   Color, UA yellow    Clarity, UA lil cloudy    Glucose, UA Negative Negative   Bilirubin, UA neg    Ketones, UA neg    Spec Grav, UA 1.015 1.010 - 1.025   Blood, UA positive 1+    pH, UA 6.0 5.0 - 8.0   Protein, UA Negative Negative   Urobilinogen, UA 0.2 0.2 or 1.0 E.U./dL   Nitrite, UA neg    Leukocytes, UA Negative Negative   Appearance     Odor      Assessment and Plan:   Adryana was seen today for dysuria.  Diagnoses and all orders for this visit:  Dysuria UA with blood however patient did take AZO recently. Symptoms consistent with past infections, will treat with bactrim. Return precautions advised. Urine culture pending and will use this to guide treatment as indicated -     POCT urinalysis dipstick  Pelvic floor weakness Referral to pelvic floor PT -     POCT urinalysis dipstick -      Ambulatory referral to Physical Therapy  Other orders -     sulfamethoxazole-trimethoprim (BACTRIM DS) 800-160 MG tablet; Take 1 tablet by mouth 2 (two) times daily.     CMA or LPN served as scribe during this visit. History, Physical, and Plan performed by medical provider. The above documentation has been reviewed and is accurate and complete.   Inda Coke, PA-C

## 2021-08-06 LAB — URINE CULTURE
MICRO NUMBER:: 12230530
Result:: NO GROWTH
SPECIMEN QUALITY:: ADEQUATE

## 2021-08-19 ENCOUNTER — Ambulatory Visit: Payer: PPO | Attending: Physician Assistant | Admitting: Physical Therapy

## 2021-08-19 ENCOUNTER — Other Ambulatory Visit: Payer: Self-pay

## 2021-08-19 DIAGNOSIS — R279 Unspecified lack of coordination: Secondary | ICD-10-CM | POA: Diagnosis not present

## 2021-08-19 DIAGNOSIS — M6281 Muscle weakness (generalized): Secondary | ICD-10-CM

## 2021-08-19 DIAGNOSIS — R2689 Other abnormalities of gait and mobility: Secondary | ICD-10-CM

## 2021-08-19 NOTE — Therapy (Signed)
[facility] [address], [place], [place], [address] Phone: [phone]   Fax:  [phone]  Physical Therapy Evaluation  Patient Details  Name: Natalie Erickson MRN: [id number] Date of Birth: Mar 15, 1951 Referring Provider (PT): Inda Coke, [place]   Encounter Date: 08/19/2021   PT End of Session - 08/19/21 1314     Visit Number 1    Date for PT Re-Evaluation 11/19/21    Authorization Type [employer]    PT Start Time 1015    PT Stop Time 1100    PT Time Calculation (min) 45 min    Activity Tolerance Patient tolerated treatment well    Behavior During Therapy [facility] for tasks assessed/performed             Past Medical History:  Diagnosis Date   AC (acromioclavicular) joint bone spurs    c 5 and c6 and thoracic   Allergy    food allergies   Anxiety 04/06/2017   Benign thyroid cyst sept 2020 dx   Cataract    bilateral   Cystitis    Depression    Dry eyes 04/06/2017   Elevated BP without diagnosis of hypertension 04/06/2017   lost weight >> BP improved   Fibromyalgia    Floaters    both eyes   Heart murmur    hx of 2 Echocardiograms in [place] (pt was told they were normal)   Hiatal hernia    Hip pain    and groin pain for a while   History of colon polyps    History of echocardiogram    Echo 9/19: mild LVH, EF 60-65, no RWMA, Gr 1 DD, trivial MR/TR, PASP 16   History of kidney stones    History of Lyme disease    Hypercalcemia    causes kidney stones, none recent   IBS (irritable bowel syndrome)    Mixed hyperlipidemia 04/06/2017   intol to statins due to myalgias; never tried Zetia   Occipital neuralgia    Sleep apnea    mild to moderate , uses cpap   TMJ (dislocation of temporomandibular joint)    UTI (urinary tract infection) finishes antibiotic 03-27-2020   Vasomotor rhinitis     Past Surgical History:  Procedure Laterality Date   APPENDECTOMY  1959   COLONOSCOPY   2016   [place]   COLONOSCOPY  2011   in [place] N/A 04/02/2020   Procedure: ANORECTAL EXAM UNDER ANESTHESIA WITH HEMORRHOIDECTOMY, HEMORRHOIDAL LIGATION;  Surgeon: [name], MD;  Location: [place];  Service: General;  Laterality: N/A;  GENERAL AND LOCAL   left ovarin cyst removed  age 63's   grapefruit sized, exp laparotomy   POLYPECTOMY     UPPER GASTROINTESTINAL ENDOSCOPY     UPPER GI ENDOSCOPY      There were no vitals filed for this visit.    Subjective Assessment - 08/19/21 1022     Subjective Pt reports she is leaking stool fairly constantly which impacts overall activity levels and unable to take baths or go to pools due to this. Pt reports she had a hemorrhoidectomy over a year ago and continues to have leakage since this. Pt has history of fibromyalga which impacts activity levels, pt does report she attempts to manage diet well which assists in stool type and consistency. Pt reports type 4 most often  based on Bristol Stool scale however sometimes ribbon like and she  feels she is unable to completely evacuate stool. Pt reports per last colonoscopy MD stated she does have scar tissue present.    Pertinent History IBS, hemorrhoidectomy , 2 vaginal births with tearing both times, fibromyalga    Currently in Pain? Yes    Pain Score 6    6/10 at worst at L groin   Pain Location Groin    Pain Orientation Left    Pain Descriptors / Indicators Burning    Pain Type Chronic pain    Pain Onset More than a month ago    Pain Frequency Constant    Aggravating Factors  diet and stress    Pain Relieving Factors walking, lavender and magnesium oil    Multiple Pain Sites Yes    Pain Score 6    Pain Location Perineum    Pain Orientation Mid    Pain Descriptors / Indicators Other (Comment);Pressure   stinging   Aggravating Factors  diet and stress    Pain Relieving Factors walking, lavender and magnesium oil                 [facility] PT Assessment - 08/19/21 0001       Assessment   Medical Diagnosis N81.89 (ICD-10-CM) - Pelvic floor weakness    Referring Provider (PT) Inda Coke, PA    Prior Therapy pelvic PT prior to hemorrhoidectomy for fecal incontinence      Precautions   Precautions None      Restrictions   Weight Bearing Restrictions No      Balance Screen   Has the patient fallen in the past 6 months No    Has the patient had a decrease in activity level because of a fear of falling?  No    Is the patient reluctant to leave their home because of a fear of falling?  No      Home Social worker Private residence    Living Arrangements Spouse/significant other;Children    Type of Home House      Prior Function   Level of Independence Independent      Cognition   Overall Cognitive Status Within Functional Limits for tasks assessed      Sensation   Light Touch Appears Intact      Coordination   Gross Motor Movements are Fluid and Coordinated Yes    Fine Motor Movements are Fluid and Coordinated Yes      Posture/Postural Control   Posture/Postural Control Postural limitations    Postural Limitations Rounded Shoulders;Posterior pelvic tilt      ROM / Strength   AROM / PROM / Strength AROM;Strength      AROM   Overall AROM Comments thoracic and lumbar spine decreased by 25% in side bending and rotation      Strength   Overall Strength Comments bil hip strength 4/5 grossly      Flexibility   Soft Tissue Assessment /Muscle Length yes   bil adductors and hamstrings limited by 25%     Palpation   SI assessment  left ilium rotated posteriorly and TTP at SIJ and more so on the Lt.                        Objective measurements completed on examination: See above findings.     Pelvic Floor Special Questions - 08/19/21 0001     Prior Pelvic/Prostate Exam No   nothing of note per pt in history with pelvic exam  Are you  Pregnant or attempting pregnancy? No    Prior Pregnancies Yes    Number of Pregnancies 2    Number of Vaginal Deliveries 2    Any difficulty with labor and deliveries Yes   tearing with both unsure grades   Currently Sexually Active No   pt reports she would like to be sexually active but in past had been painful and stopped eventually and does have vaginal dryness and irritation   History of sexually transmitted disease No    Marinoff Scale pain prevents any attempts at intercourse    Urinary Leakage No    Urinary urgency No    Urinary frequency no    Fecal incontinence Yes   not constant, but does occur after a BM or if she has loose stool.   Fluid intake feels like she drinks a lot of water    Caffeine beverages no    Falling out feeling (prolapse) No    Pelvic Floor Internal Exam deferred at this time                      PT Education - 08/19/21 1313     Education Details Pt educated on exam findings, POC, and HEP    Person(s) Educated Patient    Methods Explanation;Demonstration;Tactile cues;Verbal cues    Comprehension Verbalized understanding;Returned demonstration              PT Short Term Goals - 08/19/21 1338       PT SHORT TERM GOAL #1   Title pt to be I with HEP    Time 4    Period Weeks    Status New    Target Date 09/16/21      PT SHORT TERM GOAL #2   Title Pt to report decreased leakage to no more than 3x per week    Time 4    Period Weeks    Status New    Target Date 09/16/21      PT SHORT TERM GOAL #3   Title pt to demonstrate improved bil hip strength to 5/5 globally for improved pelvic stability and decreased compensation    Time 4    Period Weeks    Status New    Target Date 09/16/21               PT Long Term Goals - 08/19/21 1339       PT LONG TERM GOAL #1   Title Pt to be I with advanced HEP    Time 3    Period Months    Status New    Target Date 11/19/21      PT LONG TERM GOAL #2   Title pt to report no  more than 1 instance of leakage in a month    Time 3    Period Months    Status New    Target Date 11/19/21      PT LONG TERM GOAL #3   Title pt to demonstrate improved ability be I with self correction of breathing mechanics and core/pelvic floor contract/relax for improved voiding mechanics    Time 3    Period Months    Status New    Target Date 11/19/21                    Plan - 08/19/21 1315     Clinical Impression Statement Pt is 70yo female presenting to clinic for pelvic floor weakness.  Pt reports her main concern is fecal incontinence and pelvic pain. Pt has history of fecal incontinence worsening since hemorrhoidectomy over a year ago. Pt also has Lt side pelvic pain intermittently and back pain. Pt found to have bil hip weakness and decreased flexibility in spine and bil hips, decreased breathing mechanics and rib mobility. Pt may benefit from internal rectal assessment to assess this however unable to complete this at this time. pt reported vaginal dryness and pain with intercourse and no longer active due to this. Pt educated on HEP and educated on voiding mecahnics. Pt would benefit from continued PT for improvment with deficts found on eval.    Personal Factors and Comorbidities Age;Time since onset of injury/illness/exacerbation;Comorbidity 2    Comorbidities chronic history, fibromyalgia, x2 vaginal births with tearing    Examination-Activity Limitations Continence;Toileting    Examination-Participation Restrictions Interpersonal Relationship;Community Activity    Stability/Clinical Decision Making Evolving/Moderate complexity    Clinical Decision Making Moderate    Rehab Potential Good    PT Frequency Biweekly    PT Duration 12 weeks    PT Treatment/Interventions ADLs/Self Care Home Management;Functional mobility training;Therapeutic activities;Therapeutic exercise;Neuromuscular re-education;Taping;Patient/family education;Energy conservation;Passive range of  motion;Scar mobilization    PT Next Visit Plan breathing and voiding mecahnics    PT Home Exercise Plan [phone]             Patient will benefit from skilled therapeutic intervention in order to improve the following deficits and impairments:  Pain, Impaired flexibility, Improper body mechanics, Postural dysfunction, Decreased strength, Decreased mobility  Visit Diagnosis: Muscle weakness (generalized) - Plan: PT plan of care cert/re-cert  Lack of coordination - Plan: PT plan of care cert/re-cert  Other abnormalities of gait and mobility - Plan: PT plan of care cert/re-cert     Problem List Patient Active Problem List   Diagnosis Date Noted   Depression, recurrent ([place]) 10/28/2020   Aortic atherosclerosis ([place]) 10/28/2020   Statin intolerance 10/28/2020   OSA on CPAP 10/28/2020   Multiple thyroid nodules 10/28/2020   Vitamin D deficiency 10/28/2020   B12 deficiency 10/28/2020   DDD (degenerative disc disease), cervical 01/08/2020   Bilateral occipital neuralgia 01/08/2020   Somatization disorder 11/14/2018   Posterior vitreous detachment of right eye 09/10/2018   History of IBS 01/30/2018   Fibromyalgia 06/04/2017   Chronic fatigue 06/04/2017   ANA positive 05/20/2017   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 Patient is motivated to learn. Patient understands and agrees with treatment goals and plan. PT explains patient will be examined in standing, sitting, and lying down to see how their muscles and joints work. When they are ready, they will be asked to remove their underwear so PT can examine their perineum. The patient is also given the option of providing their own chaperone as one is not provided in our facility. The patient also has the right and is explained the right to defer or refuse any part of  the evaluation or treatment including the internal exam. With the patient's consent, PT will use one gloved finger to gently assess the muscles of the pelvic floor, seeing how well it contracts and relaxes and if there is muscle symmetry. After, the patient will get dressed and PT and patient will discuss exam findings and plan of care. PT and patient discuss plan of care, schedule, attendance policy and HEP activities.  Stacy Gardner, PT, DPT 08/25/221:42 PM   [facility] [address] [place], [place], [address] Phone: [phone]   Fax:  [phone]  Name: Natalie Erickson MRN: [id number] Date of Birth: 27-Jun-1951

## 2021-09-10 DIAGNOSIS — H35363 Drusen (degenerative) of macula, bilateral: Secondary | ICD-10-CM | POA: Diagnosis not present

## 2021-09-10 DIAGNOSIS — H25013 Cortical age-related cataract, bilateral: Secondary | ICD-10-CM | POA: Diagnosis not present

## 2021-09-10 DIAGNOSIS — H04123 Dry eye syndrome of bilateral lacrimal glands: Secondary | ICD-10-CM | POA: Diagnosis not present

## 2021-09-10 DIAGNOSIS — H524 Presbyopia: Secondary | ICD-10-CM | POA: Diagnosis not present

## 2021-09-10 DIAGNOSIS — H2513 Age-related nuclear cataract, bilateral: Secondary | ICD-10-CM | POA: Diagnosis not present

## 2021-09-15 ENCOUNTER — Ambulatory Visit: Payer: PPO | Attending: Physician Assistant | Admitting: Physical Therapy

## 2021-09-15 ENCOUNTER — Other Ambulatory Visit: Payer: Self-pay

## 2021-09-15 DIAGNOSIS — M6281 Muscle weakness (generalized): Secondary | ICD-10-CM | POA: Insufficient documentation

## 2021-09-15 DIAGNOSIS — R252 Cramp and spasm: Secondary | ICD-10-CM | POA: Insufficient documentation

## 2021-09-15 DIAGNOSIS — R279 Unspecified lack of coordination: Secondary | ICD-10-CM | POA: Diagnosis not present

## 2021-09-15 NOTE — Therapy (Signed)
[facility] [address], [place], [place], [address] Phone: [phone]   Fax:  [phone]  Physical Therapy Treatment  Patient Details  Name: Natalie Erickson MRN: [id number] Date of Birth: 10-May-1951 Referring Provider (PT): Inda Coke, [place]   Encounter Date: 09/15/2021   PT End of Session - 09/15/21 1237     Visit Number 2    Date for PT Re-Evaluation 11/19/21    Authorization Type [employer]    PT Start Time 1145    PT Stop Time 1228    PT Time Calculation (min) 43 min    Activity Tolerance Patient tolerated treatment well    Behavior During Therapy [facility] for tasks assessed/performed             Past Medical History:  Diagnosis Date   AC (acromioclavicular) joint bone spurs    c 5 and c6 and thoracic   Allergy    food allergies   Anxiety 04/06/2017   Benign thyroid cyst sept 2020 dx   Cataract    bilateral   Cystitis    Depression    Dry eyes 04/06/2017   Elevated BP without diagnosis of hypertension 04/06/2017   lost weight >> BP improved   Fibromyalgia    Floaters    both eyes   Heart murmur    hx of 2 Echocardiograms in [place] (pt was told they were normal)   Hiatal hernia    Hip pain    and groin pain for a while   History of colon polyps    History of echocardiogram    Echo 9/19: mild LVH, EF 60-65, no RWMA, Gr 1 DD, trivial MR/TR, PASP 16   History of kidney stones    History of Lyme disease    Hypercalcemia    causes kidney stones, none recent   IBS (irritable bowel syndrome)    Mixed hyperlipidemia 04/06/2017   intol to statins due to myalgias; never tried Zetia   Occipital neuralgia    Sleep apnea    mild to moderate , uses cpap   TMJ (dislocation of temporomandibular joint)    UTI (urinary tract infection) finishes antibiotic 03-27-2020   Vasomotor rhinitis     Past Surgical History:  Procedure Laterality Date   APPENDECTOMY  1959   COLONOSCOPY   2016   [place]   COLONOSCOPY  2011   in [place] N/A 04/02/2020   Procedure: ANORECTAL EXAM UNDER ANESTHESIA WITH HEMORRHOIDECTOMY, HEMORRHOIDAL LIGATION;  Surgeon: [name], MD;  Location: [place];  Service: General;  Laterality: N/A;  GENERAL AND LOCAL   left ovarin cyst removed  age 35's   grapefruit sized, exp laparotomy   POLYPECTOMY     UPPER GASTROINTESTINAL ENDOSCOPY     UPPER GI ENDOSCOPY      There were no vitals filed for this visit.   Subjective Assessment - 09/15/21 1147     Subjective Pt reports she has not had any leakage since last visit after starting Candida diet which is low sugar, carb, and diet. Pt reports she had great results with this. But does still have some pain in low back intermittently, and Lt hip/groin pain in AM with waking however attributes this to fibromyalgia, also post BM has some rectal and lower abdomen discomfort. Pt states this has also improved since starting new diet.    Pertinent History IBS, hemorrhoidectomy , 2 vaginal births with tearing  both times, fibromyalga    Currently in Pain? Yes    Pain Score 3    worse in morning with first getting up at 3-4/10. Also some discomfort post BM at rectum and lower abdomen   Pain Location Groin    Pain Orientation Left                               [facility] Adult PT Treatment/Exercise - 09/15/21 0001       Neuro Re-ed    Neuro Re-ed Details  pt directed in 2x10 diaphragmatic breathing in supine however noted decreased expansion laterally, theraband added  for feedback and cues to improve with noted improvement overall.      Manual Therapy   Manual Therapy Soft tissue mobilization    Manual therapy comments manual work at scar tissue at lower abdomen with noted restrictions in all directions, improvement noted by pt and manually during treatment with pt also reporting no pain and feeling "I can move  now". Pt also lead in manual work in prone with power lumbar and thoracic regions for improved rib expansion and decreased restrictions in abdomen.                     PT Education - 09/15/21 1236     Education Details pt educated to continue HEP, breathing mechanics, and scar massage.    Person(s) Educated Patient    Methods Explanation;Demonstration;Tactile cues;Verbal cues    Comprehension Verbalized understanding;Returned demonstration              PT Short Term Goals - 08/19/21 1338       PT SHORT TERM GOAL #1   Title pt to be I with HEP    Time 4    Period Weeks    Status New    Target Date 09/16/21      PT SHORT TERM GOAL #2   Title Pt to report decreased leakage to no more than 3x per week    Time 4    Period Weeks    Status New    Target Date 09/16/21      PT SHORT TERM GOAL #3   Title pt to demonstrate improved bil hip strength to 5/5 globally for improved pelvic stability and decreased compensation    Time 4    Period Weeks    Status New    Target Date 09/16/21               PT Long Term Goals - 08/19/21 1339       PT LONG TERM GOAL #1   Title Pt to be I with advanced HEP    Time 3    Period Months    Status New    Target Date 11/19/21      PT LONG TERM GOAL #2   Title pt to report no more than 1 instance of leakage in a month    Time 3    Period Months    Status New    Target Date 11/19/21      PT LONG TERM GOAL #3   Title pt to demonstrate improved ability be I with self correction of breathing mechanics and core/pelvic floor contract/relax for improved voiding mechanics    Time 3    Period Months    Status New    Target Date 11/19/21  Plan - 09/15/21 1237     Clinical Impression Statement Pt presenting to clinic reporting she has had no fecal leakage since last visit and starting Candida diet. Pt does report continued hip and low back pain. Pt complaint with HEP and session focused on manual  work at abdomen and scar mobilization as well as posterior lumbar and thoracic region for improved rib, scar, and abdomen mobility. Pt reported feeling much better at end of session and reported that she felt looser and able to move more. Pt would benefit from continued PT for improvment with deficts found on eval.    Personal Factors and Comorbidities Age;Time since onset of injury/illness/exacerbation;Comorbidity 2    Comorbidities chronic history, fibromyalgia, x2 vaginal births with tearing    Examination-Activity Limitations Continence;Toileting    Examination-Participation Restrictions Interpersonal Relationship;Community Activity    Stability/Clinical Decision Making Evolving/Moderate complexity    Clinical Decision Making Moderate    Rehab Potential Good    PT Frequency Biweekly    PT Duration 12 weeks    PT Treatment/Interventions ADLs/Self Care Home Management;Functional mobility training;Therapeutic activities;Therapeutic exercise;Neuromuscular re-education;Taping;Patient/family education;Energy conservation;Passive range of motion;Scar mobilization    PT Next Visit Plan breathing and voiding mecahnics    PT Home Exercise Plan [id number]    Consulted and Agree with Plan of Care Patient             Patient will benefit from skilled therapeutic intervention in order to improve the following deficits and impairments:  Pain, Impaired flexibility, Improper body mechanics, Postural dysfunction, Decreased strength, Decreased mobility  Visit Diagnosis: Muscle weakness (generalized)  Lack of coordination  Cramp and spasm     Problem List Patient Active Problem List   Diagnosis Date Noted   Depression, recurrent ([place]) 10/28/2020   Aortic atherosclerosis ([place]) 10/28/2020   Statin intolerance 10/28/2020   OSA on CPAP 10/28/2020   Multiple thyroid nodules 10/28/2020   Vitamin D deficiency 10/28/2020   B12 deficiency 10/28/2020   DDD (degenerative disc disease), cervical  01/08/2020   Bilateral occipital neuralgia 01/08/2020   Somatization disorder 11/14/2018   Posterior vitreous detachment of right eye 09/10/2018   History of IBS 01/30/2018   Fibromyalgia 06/04/2017   Chronic fatigue 06/04/2017   ANA positive 05/20/2017   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, [place], [place], [address] Phone: [phone]   Fax:  [phone]  Name: Natalie Erickson MRN: [id number] Date of Birth: 1951-07-20

## 2021-09-16 ENCOUNTER — Other Ambulatory Visit: Payer: Self-pay

## 2021-09-16 ENCOUNTER — Encounter: Payer: Self-pay | Admitting: Internal Medicine

## 2021-09-16 ENCOUNTER — Ambulatory Visit (INDEPENDENT_AMBULATORY_CARE_PROVIDER_SITE_OTHER): Payer: PPO | Admitting: Internal Medicine

## 2021-09-16 VITALS — BP 124/80 | HR 80 | Ht 62.0 in | Wt 155.2 lb

## 2021-09-16 DIAGNOSIS — E042 Nontoxic multinodular goiter: Secondary | ICD-10-CM | POA: Diagnosis not present

## 2021-09-16 NOTE — Progress Notes (Signed)
Name: [name]  MRN/ DOB: [id number], 01-Dec-1951    Age/ Sex: 70 y.o., female     PCP: [name], MD   Reason for Endocrinology Evaluation: MNG     Initial Endocrinology Clinic Visit: 09/10/2019    PATIENT IDENTIFIER: Natalie Erickson is a 70 y.o., female with a past medical history of GAD, fibromyalgia , OSA on CPAP and dyslipidemia. She has followed with [facility] since 09/09/2020 for consultative assistance with management of her MNG.   HISTORICAL SUMMARY:   Pt was found to have an incidental thyroid nodules on MRI 06/2019 during evaluation of a left neck pain with radiculopathy.      An ultrasound 07/2019 confirmed diagnosis of multiple thyroid nodules, with the largest being 3.3 cm at the left middle thyroid lobe . She is S/P benign FNA of the left mid thyroid nodule on 09/17/2019     No FH of thyroid disease   SUBJECTIVE:    Today (09/16/2021):  Natalie Erickson is here for a follow up on MNG.   Weight fluctuates  Has chronic constipation but this has improved with diet and magnesium  Rare palpitations  Denies tremors  Denies local neck symptoms    Has fibromyalgia- going  through PT , continues with Tai chi       HISTORY:  Past Medical History:  Past Medical History:  Diagnosis Date   AC (acromioclavicular) joint bone spurs    c 5 and c6 and thoracic   Allergy    food allergies   Anxiety 04/06/2017   Benign thyroid cyst sept 2020 dx   Cataract    bilateral   Cystitis    Depression    Dry eyes 04/06/2017   Elevated BP without diagnosis of hypertension 04/06/2017   lost weight >> BP improved   Fibromyalgia    Floaters    both eyes   Heart murmur    hx of 2 Echocardiograms in [place] (pt was told they were normal)   Hiatal hernia    Hip pain    and groin pain for a while   History of colon polyps    History of echocardiogram    Echo 9/19: mild LVH, EF 60-65, no RWMA, Gr 1 DD, trivial MR/TR, PASP 16   History of kidney  stones    History of Lyme disease    Hypercalcemia    causes kidney stones, none recent   IBS (irritable bowel syndrome)    Mixed hyperlipidemia 04/06/2017   intol to statins due to myalgias; never tried Zetia   Occipital neuralgia    Sleep apnea    mild to moderate , uses cpap   TMJ (dislocation of temporomandibular joint)    UTI (urinary tract infection) finishes antibiotic 03-27-2020   Vasomotor rhinitis    Past Surgical History:  Past Surgical History:  Procedure Laterality Date   APPENDECTOMY  1959   COLONOSCOPY  2016   [place]   COLONOSCOPY  2011   in [place] N/A 04/02/2020   Procedure: ANORECTAL EXAM UNDER ANESTHESIA WITH HEMORRHOIDECTOMY, HEMORRHOIDAL LIGATION;  Surgeon: [name], MD;  Location: [facility];  Service: General;  Laterality: N/A;  GENERAL AND LOCAL   left ovarin cyst removed  age 50's   grapefruit sized, exp laparotomy   POLYPECTOMY     UPPER GASTROINTESTINAL ENDOSCOPY     UPPER GI ENDOSCOPY     Social History:  reports that  she has never smoked. She has never used smokeless tobacco. She reports that she does not drink alcohol and does not use drugs. Family History:  Family History  Problem Relation Age of Onset   Healthy Mother    Hypertension Mother    Healthy Father    Lung cancer Father    Breast cancer Sister    Diabetes Paternal Grandmother    Colon cancer Neg Hx    Stomach cancer Neg Hx    Heart attack Neg Hx    Heart failure Neg Hx    Pancreatic cancer Neg Hx    Colon polyps Neg Hx    Esophageal cancer Neg Hx    Rectal cancer Neg Hx      HOME MEDICATIONS: Allergies as of 09/16/2021       Reactions   Diltiazem Shortness Of Breath   Metoprolol Shortness Of Breath   Statins Tinitus   Achy joints, muscle aches Achy joints, muscle aches   Chocolate Other (See Comments)   migraine   Cocoa Other (See Comments)   migraine migraine   Codeine Nausea And  Vomiting   Cranberry Other (See Comments)   Cystitis   Latex Rash   Monosodium Glutamate Other (See Comments)   Migraine   Penicillins Rash   Has patient had a PCN reaction causing immediate rash, facial/tongue/throat swelling, SOB or lightheadedness with hypotension: Yes Has patient had a PCN reaction causing severe rash involving mucus membranes or skin necrosis: No Has patient had a PCN reaction that required hospitalization No Has patient had a PCN reaction occurring within the last 10 years: No If all of the above answers are "NO", then may proceed with Cephalosporin use.   Antihistamines, Loratadine-type    Throat swelling   Carvedilol Other (See Comments)   N&N, SOB , Head ache and joint pain.    Ginger    Gluten Meal    Lexapro [escitalopram]    GI    Montelukast Other (See Comments)   Throat swelling   Other Swelling   Throat swelling   Prednisone Swelling   Throat swelling (no difficulty breathing)   Sulfites Other (See Comments)   Nausea and headaches   Vanilla    Whey    Zofran [ondansetron Hcl] Other (See Comments)   Muscle cramps, leg tingling   Azithromycin Other (See Comments), Diarrhea   Eggs Or Egg-derived Products Other (See Comments)   States exacerbates her IBS symptoms        Medication List        Accurate as of September 16, 2021 10:29 AM. If you have any questions, ask your nurse or doctor.          STOP taking these medications    sulfamethoxazole-trimethoprim 800-160 MG tablet Commonly known as: BACTRIM DS Stopped by: Dorita Sciara, MD       TAKE these medications    acetaminophen 500 MG tablet Commonly known as: TYLENOL Take 1,000 mg by mouth every 6 (six) hours as needed for headache (pain).   AMBULATORY NON FORMULARY MEDICATION Take 1 tablet by mouth as needed. Medication Name: [name]   Magnesium Glycinate Powd 120 mg by Does not apply route as needed.   OLIVE LEAF EXTRACT PO Take by mouth.   PHILLIPS  COLON HEALTH PO Take by mouth daily as needed.   polyethylene glycol powder 17 GM/SCOOP powder Commonly known as: GLYCOLAX/MIRALAX Take 17-34 g by mouth daily.   SYSTANE ULTRA OP Apply to eye at bedtime.  Vitamin D 50 MCG (2000 UT) tablet Take 2,000 Units by mouth daily.         OBJECTIVE:   PHYSICAL EXAM: VS: BP 124/80 (BP Location: Left Arm, Patient Position: Sitting, Cuff Size: Large)   Pulse 80   Ht 5' 2" (1.575 m)   Wt 155 lb 3.2 oz (70.4 kg)   SpO2 99%   BMI 28.39 kg/m    EXAM: General: Pt appears well and is in NAD  Neck: General: Supple without adenopathy. Thyroid: Thyroid size normal.  Fullness palpated on the left   Lungs: Clear with good BS bilat with no rales, rhonchi, or wheezes  Heart: Auscultation: RRR.  Abdomen: Normoactive bowel sounds, soft, nontender, without masses or organomegaly palpable  Extremities:  BL LE: No pretibial edema normal ROM and strength.  Mental Status: Judgment, insight: Intact Orientation: Oriented to time, place, and person Mood and affect: No depression, anxiety, or agitation     DATA REVIEWED:   Results for Natalie Erickson, Natalie Erickson (MRN [id number]) as of 09/16/2021 10:25  Ref. Range 10/28/2020 09:27  TSH Latest Ref Range: 0.40 - 4.50 mIU/L 0.64  Triiodothyronine,Free,Serum Latest Ref Range: 2.3 - 4.2 pg/mL 3.3  T4,Free(Direct) Latest Ref Range: 0.8 - 1.8 ng/dL 1.1     FNA 09/17/2019 Clinical History: Left mid 3.3cm; Other 2 dimensions: 2.3 x 1.7cm, Solid  DIAGNOSIS:  - Consistent with benign follicular nodule (Bethesda category II)   ASSESSMENT / PLAN / RECOMMENDATIONS:   Multinodular Goiter:  - Pt is clinically euthyroid - No local neck symptoms  - She will have her TFT's checked at PCP's office during physical - S/P benign FNA of the left mid nodule (08/2019) - Will proceed with thyroid ultrasound   F/U in 1 yr    Signed electronically by: Mack Guise, MD  [facility]  [facility] [address] [place], [place] [address] Phone: [phone] FAX: [phone]      CC: [name], [place] [address] Phone: [phone]  Fax: [phone]   Return to Endocrinology clinic as below: Future Appointments  Date Time Provider [place]  09/16/2021 10:30 AM Shamleffer, Melanie Crazier, MD [facility] None  09/28/2021 10:15 AM Junie Panning, PT [facility]  10/12/2021  9:30 AM Junie Panning, PT [facility]  10/26/2021 10:15 AM Junie Panning, PT [facility]  11/09/2021 10:15 AM Junie Panning, PT [facility]  11/23/2021 10:15 AM Junie Panning, PT [facility]  12/14/2021 10:30 AM Ubaldo Glassing, Amy, NP [facility] None

## 2021-09-28 ENCOUNTER — Ambulatory Visit: Payer: PPO | Attending: Physician Assistant | Admitting: Physical Therapy

## 2021-09-28 ENCOUNTER — Other Ambulatory Visit: Payer: Self-pay

## 2021-09-28 DIAGNOSIS — M6281 Muscle weakness (generalized): Secondary | ICD-10-CM | POA: Diagnosis not present

## 2021-09-28 DIAGNOSIS — R279 Unspecified lack of coordination: Secondary | ICD-10-CM | POA: Insufficient documentation

## 2021-09-28 DIAGNOSIS — R293 Abnormal posture: Secondary | ICD-10-CM | POA: Diagnosis not present

## 2021-09-28 NOTE — Therapy (Signed)
[facility] @ [place], [place], [address] Phone:     Fax:     Physical Therapy Treatment  Patient Details  Name: Natalie Erickson MRN: [id number] Date of Birth: Oct 28, 1951 Referring Provider (PT): Inda Coke, [place]   Encounter Date: 09/28/2021   PT End of Session - 09/28/21 1110     Visit Number 3    Date for PT Re-Evaluation 11/19/21    Authorization Type [employer]    PT Start Time 1021   pt arrival   PT Stop Time 1100    PT Time Calculation (min) 39 min    Activity Tolerance Patient tolerated treatment well    Behavior During Therapy [facility] for tasks assessed/performed             Past Medical History:  Diagnosis Date   AC (acromioclavicular) joint bone spurs    c 5 and c6 and thoracic   Allergy    food allergies   Anxiety 04/06/2017   Benign thyroid cyst sept 2020 dx   Cataract    bilateral   Cystitis    Depression    Dry eyes 04/06/2017   Elevated BP without diagnosis of hypertension 04/06/2017   lost weight >> BP improved   Fibromyalgia    Floaters    both eyes   Heart murmur    hx of 2 Echocardiograms in [place] (pt was told they were normal)   Hiatal hernia    Hip pain    and groin pain for a while   History of colon polyps    History of echocardiogram    Echo 9/19: mild LVH, EF 60-65, no RWMA, Gr 1 DD, trivial MR/TR, PASP 16   History of kidney stones    History of Lyme disease    Hypercalcemia    causes kidney stones, none recent   IBS (irritable bowel syndrome)    Mixed hyperlipidemia 04/06/2017   intol to statins due to myalgias; never tried Zetia   Occipital neuralgia    Sleep apnea    mild to moderate , uses cpap   TMJ (dislocation of temporomandibular joint)    UTI (urinary tract infection) finishes antibiotic 03-27-2020   Vasomotor rhinitis     Past Surgical History:  Procedure Laterality Date   APPENDECTOMY  1959   COLONOSCOPY  2016    [place]   COLONOSCOPY  2011   in [place] N/A 04/02/2020   Procedure: ANORECTAL EXAM UNDER ANESTHESIA WITH HEMORRHOIDECTOMY, HEMORRHOIDAL LIGATION;  Surgeon: [name], MD;  Location: [facility];  Service: General;  Laterality: N/A;  GENERAL AND LOCAL   left ovarin cyst removed  age 70's   grapefruit sized, exp laparotomy   POLYPECTOMY     UPPER GASTROINTESTINAL ENDOSCOPY     UPPER GI ENDOSCOPY      There were no vitals filed for this visit.   Subjective Assessment - 09/28/21 1026     Subjective Pt reports she has had a little loose stool and minimal leakage over the last 3 days but thinks this was due to diet. Pt reports overall a great improvement with leakage symptoms.    Pertinent History IBS, hemorrhoidectomy , 2 vaginal births with tearing both times, fibromyalga    Currently in Pain? Yes    Pain Score 4     Pain Location Bladder    Pain Orientation Mid    Pain  Descriptors / Indicators Cramping    Pain Type Acute pain    Pain Onset More than a month ago    Pain Frequency Constant                               [facility] Adult PT Treatment/Exercise - 09/28/21 0001       Exercises   Exercises Knee/Hip;Lumbar      Lumbar Exercises: Standing   Functional Squats 10 reps    Functional Squats Limitations 2x2# handweights    Other Standing Lumbar Exercises tricep kick backs 2# x10 each with TA activations; standing marios 2# x10, standing bicep curls with combined OHP x10 2#      Knee/Hip Exercises: Standing   Forward Lunges Both;10 reps    Forward Lunges Limitations mini lunges 2#                     PT Education - 09/28/21 1109     Education Details Pt educated on additions of mini squats and mini lunges to HEP and breathing mechanics with exercises.    Person(s) Educated Patient    Methods Explanation;Demonstration;Tactile cues;Verbal cues    Comprehension  Verbalized understanding;Returned demonstration              PT Short Term Goals - 09/28/21 1120       PT SHORT TERM GOAL #1   Title pt to be I with HEP    Time 4    Period Weeks    Status On-going    Target Date 09/16/21      PT SHORT TERM GOAL #2   Title Pt to report decreased leakage to no more than 3x per week    Time 4    Period Weeks    Status On-going    Target Date 09/16/21      PT SHORT TERM GOAL #3   Title pt to demonstrate improved bil hip strength to 5/5 globally for improved pelvic stability and decreased compensation    Time 4    Period Weeks    Status On-going    Target Date 09/16/21               PT Long Term Goals - 08/19/21 1339       PT LONG TERM GOAL #1   Title Pt to be I with advanced HEP    Time 3    Period Months    Status New    Target Date 11/19/21      PT LONG TERM GOAL #2   Title pt to report no more than 1 instance of leakage in a month    Time 3    Period Months    Status New    Target Date 11/19/21      PT LONG TERM GOAL #3   Title pt to demonstrate improved ability be I with self correction of breathing mechanics and core/pelvic floor contract/relax for improved voiding mechanics    Time 3    Period Months    Status New    Target Date 11/19/21                   Plan - 09/28/21 1114     Clinical Impression Statement Pt presenting to clinic with mild leakage for the past few days but improving and not feeling well during this time but reports this was due to diet. Pt reports overall  she feels like she is improving a lot with leakage symptoms and continues to be complaint with the diet that helps her symptoms, HEP and overall activity levels. Pt session focused on global strengthening with cues for all exercises for TA activation and proper breathing mechanics to decrease strain on pelvic floor to decrease leakage. Pt tolerated well and HEP updated at end of session, pt given print out.    Personal Factors and  Comorbidities Age;Time since onset of injury/illness/exacerbation;Comorbidity 2    Comorbidities chronic history, fibromyalgia, x2 vaginal births with tearing    Examination-Activity Limitations Continence;Toileting    Examination-Participation Restrictions Interpersonal Relationship;Community Activity    Stability/Clinical Decision Making Evolving/Moderate complexity    Clinical Decision Making Moderate    Rehab Potential Good    PT Frequency Biweekly    PT Duration 12 weeks    PT Treatment/Interventions ADLs/Self Care Home Management;Functional mobility training;Therapeutic activities;Therapeutic exercise;Neuromuscular re-education;Taping;Patient/family education;Energy conservation;Passive range of motion;Scar mobilization    PT Next Visit Plan voiding mechanics and abdominal massage    PT Home Exercise Plan [id number]    Consulted and Agree with Plan of Care Patient             Patient will benefit from skilled therapeutic intervention in order to improve the following deficits and impairments:  Pain, Impaired flexibility, Improper body mechanics, Postural dysfunction, Decreased strength, Decreased mobility  Visit Diagnosis: Lack of coordination  Muscle weakness (generalized)  Abnormal posture     Problem List Patient Active Problem List   Diagnosis Date Noted   Depression, recurrent ([place]) 10/28/2020   Aortic atherosclerosis ([place]) 10/28/2020   Statin intolerance 10/28/2020   OSA on CPAP 10/28/2020   Multiple thyroid nodules 10/28/2020   Vitamin D deficiency 10/28/2020   B12 deficiency 10/28/2020   DDD (degenerative disc disease), cervical 01/08/2020   Bilateral occipital neuralgia 01/08/2020   Somatization disorder 11/14/2018   Posterior vitreous detachment of right eye 09/10/2018   History of IBS 01/30/2018   Fibromyalgia 06/04/2017   Chronic fatigue 06/04/2017   ANA positive 05/20/2017   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

## 2021-10-01 DIAGNOSIS — M9902 Segmental and somatic dysfunction of thoracic region: Secondary | ICD-10-CM | POA: Diagnosis not present

## 2021-10-01 DIAGNOSIS — M9901 Segmental and somatic dysfunction of cervical region: Secondary | ICD-10-CM | POA: Diagnosis not present

## 2021-10-01 DIAGNOSIS — M791 Myalgia, unspecified site: Secondary | ICD-10-CM | POA: Diagnosis not present

## 2021-10-08 ENCOUNTER — Ambulatory Visit
Admission: RE | Admit: 2021-10-08 | Discharge: 2021-10-08 | Disposition: A | Discharge status: Home / Self Care | Payer: PPO | Source: Ambulatory Visit | Attending: Internal Medicine | Admitting: Internal Medicine

## 2021-10-08 DIAGNOSIS — E041 Nontoxic single thyroid nodule: Secondary | ICD-10-CM | POA: Diagnosis not present

## 2021-10-08 DIAGNOSIS — E042 Nontoxic multinodular goiter: Secondary | ICD-10-CM

## 2021-10-12 ENCOUNTER — Other Ambulatory Visit: Payer: Self-pay

## 2021-10-12 ENCOUNTER — Ambulatory Visit: Payer: PPO | Admitting: Physical Therapy

## 2021-10-12 DIAGNOSIS — R279 Unspecified lack of coordination: Secondary | ICD-10-CM | POA: Diagnosis not present

## 2021-10-12 DIAGNOSIS — M6281 Muscle weakness (generalized): Secondary | ICD-10-CM

## 2021-10-12 NOTE — Therapy (Signed)
[facility] @ [place], [place], [address] Phone: [phone]   Fax:  [phone]  Physical Therapy Treatment  Patient Details  Name: Natalie Erickson MRN: [id number] Date of Birth: [birth year] Referring Provider (PT): Inda Coke, [place]   Encounter Date: 10/12/2021   PT End of Session - 10/12/21 1148     Visit Number 4    Date for PT Re-Evaluation 11/19/21    Authorization Type [employer]    PT Start Time 0930    PT Stop Time 1015    PT Time Calculation (min) 45 min    Activity Tolerance Patient tolerated treatment well    Behavior During Therapy [facility] for tasks assessed/performed             Past Medical History:  Diagnosis Date   AC (acromioclavicular) joint bone spurs    c 5 and c6 and thoracic   Allergy    food allergies   Anxiety 04/06/2017   Benign thyroid cyst sept 2020 dx   Cataract    bilateral   Cystitis    Depression    Dry eyes 04/06/2017   Elevated BP without diagnosis of hypertension 04/06/2017   lost weight >> BP improved   Fibromyalgia    Floaters    both eyes   Heart murmur    hx of 2 Echocardiograms in [place] (pt was told they were normal)   Hiatal hernia    Hip pain    and groin pain for a while   History of colon polyps    History of echocardiogram    Echo 9/19: mild LVH, EF 60-65, no RWMA, Gr 1 DD, trivial MR/TR, PASP 16   History of kidney stones    History of Lyme disease    Hypercalcemia    causes kidney stones, none recent   IBS (irritable bowel syndrome)    Mixed hyperlipidemia 04/06/2017   intol to statins due to myalgias; never tried Zetia   Occipital neuralgia    Sleep apnea    mild to moderate , uses cpap   TMJ (dislocation of temporomandibular joint)    UTI (urinary tract infection) finishes antibiotic 03-27-2020   Vasomotor rhinitis     Past Surgical History:  Procedure Laterality Date   APPENDECTOMY  1959   COLONOSCOPY   2016   [place]   COLONOSCOPY  2011   in [place] N/A 04/02/2020   Procedure: ANORECTAL EXAM UNDER ANESTHESIA WITH HEMORRHOIDECTOMY, HEMORRHOIDAL LIGATION;  Surgeon: [name], MD;  Location: [facility];  Service: General;  Laterality: N/A;  GENERAL AND LOCAL   left ovarin cyst removed  age 44's   grapefruit sized, exp laparotomy   POLYPECTOMY     UPPER GASTROINTESTINAL ENDOSCOPY     UPPER GI ENDOSCOPY      There were no vitals filed for this visit.   Subjective Assessment - 10/12/21 0934     Subjective Pt reports she has not had any leakage in over month. Pt reports no longer taking mirilax which has helped, does take slippery elm occasionally.    Pertinent History IBS, hemorrhoidectomy , 2 vaginal births with tearing both times, fibromyalga    Currently in Pain? Yes    Pain Score 3     Pain Location --   globally achy with fibro   Pain Descriptors / Indicators Aching  [facility] Adult PT Treatment/Exercise - 10/12/21 0001       Self-Care   Self-Care Other Self-Care Comments    Other Self-Care Comments  Pt educated on continued activities for improved tolerance to activity, mobility and pt reports she understands how to modify diet which has been her biggest help now.      Lumbar Exercises: Stretches   Pelvic Tilt 10 reps      Lumbar Exercises: Standing   Functional Squats 10 reps    Functional Squats Limitations 10# kettle bell      Lumbar Exercises: Supine   Clam 10 reps;Limitations    Clam Limitations black loop    Bridge 10 reps    Other Supine Lumbar Exercises hip flexion black loop x10    Other Supine Lumbar Exercises adduction black loop x10      Lumbar Exercises: Prone   Opposite Arm/Leg Raise Right arm/Left leg;Left arm/Right leg;10 reps      Knee/Hip Exercises: Standing   Forward Lunges Both;10 reps                      PT Education - 10/12/21 1147     Education Details Pt eduated on proper technique with all exercises HEP additions, continued mobility/activities and diet modifications pt has made.    Person(s) Educated Patient    Methods Explanation;Demonstration;Tactile cues;Verbal cues    Comprehension Verbalized understanding;Returned demonstration              PT Short Term Goals - 10/12/21 1157       PT SHORT TERM GOAL #1   Title pt to be I with HEP    Time 4    Period Weeks    Status Achieved    Target Date 09/16/21      PT SHORT TERM GOAL #2   Title Pt to report decreased leakage to no more than 3x per week    Time 4    Period Weeks    Status Achieved    Target Date 09/16/21      PT SHORT TERM GOAL #3   Title pt to demonstrate improved bil hip strength to 5/5 globally for improved pelvic stability and decreased compensation    Time 4    Period Weeks    Status Achieved    Target Date 09/16/21               PT Long Term Goals - 10/12/21 1158       PT LONG TERM GOAL #1   Title Pt to be I with advanced HEP    Time 3    Period Months    Status Achieved      PT LONG TERM GOAL #2   Title pt to report no more than 1 instance of leakage in a month    Time 3    Period Months    Status Achieved      PT LONG TERM GOAL #3   Title pt to demonstrate improved ability be I with self correction of breathing mechanics and core/pelvic floor contract/relax for improved voiding mechanics    Time 3    Period Months    Status Achieved                   Plan - 10/12/21 1148     Clinical Impression Statement Pt presenting to clinic with reports of no leakage for at least a month, has made many diet changes which have helped a  lot per pt and she is limited with fatigue and chronic pain with fibromyalgia and chronic pain syndrome. Pt tolerated session well requested today to be her last session due to improvement with fecal leakage and feels much better about continuing  progession with activity on her own now. Pt session focused on hip and core strength, energy conservation education, and continuing mobility and activity training for improved tolerance to activity and global strengthening/flexibility.    Personal Factors and Comorbidities Age;Time since onset of injury/illness/exacerbation;Comorbidity 2    Comorbidities chronic history, fibromyalgia, x2 vaginal births with tearing    Examination-Activity Limitations Continence;Toileting    Examination-Participation Restrictions Interpersonal Relationship;Community Activity    Stability/Clinical Decision Making Evolving/Moderate complexity    Clinical Decision Making Moderate    Rehab Potential Good    PT Frequency Biweekly    PT Duration 12 weeks    PT Treatment/Interventions ADLs/Self Care Home Management;Functional mobility training;Therapeutic activities;Therapeutic exercise;Neuromuscular re-education;Taping;Patient/family education;Energy conservation;Passive range of motion;Scar mobilization    PT Home Exercise Plan [id number]    Consulted and Agree with Plan of Care Patient             Patient will benefit from skilled therapeutic intervention in order to improve the following deficits and impairments:  Pain, Impaired flexibility, Improper body mechanics, Postural dysfunction, Decreased strength, Decreased mobility  Visit Diagnosis: Muscle weakness (generalized)  Lack of coordination     Problem List Patient Active Problem List   Diagnosis Date Noted   Depression, recurrent ([place]) 10/28/2020   Aortic atherosclerosis ([place]) 10/28/2020   Statin intolerance 10/28/2020   OSA on CPAP 10/28/2020   Multiple thyroid nodules 10/28/2020   Vitamin D deficiency 10/28/2020   B12 deficiency 10/28/2020   DDD (degenerative disc disease), cervical 01/08/2020   Bilateral occipital neuralgia 01/08/2020   Somatization disorder 11/14/2018   Posterior vitreous detachment of right eye 09/10/2018   History of  IBS 01/30/2018   Fibromyalgia 06/04/2017   Chronic fatigue 06/04/2017   ANA positive 05/20/2017   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 Patient goals were met. Patient is being discharged due to meeting the stated rehab goals.    Stacy Gardner, PT, DPT 10/12/2210:59 AM   [facility] @ [place], [place], [address] Phone: [phone]   Fax:  [phone]  Name: Natalie Erickson MRN: [id number] Date of Birth: 22-Oct-1951

## 2021-10-18 DIAGNOSIS — G4733 Obstructive sleep apnea (adult) (pediatric): Secondary | ICD-10-CM | POA: Diagnosis not present

## 2021-10-26 ENCOUNTER — Encounter: Payer: PPO | Admitting: Physical Therapy

## 2021-10-29 ENCOUNTER — Other Ambulatory Visit: Payer: Self-pay

## 2021-10-29 ENCOUNTER — Encounter: Payer: Self-pay | Admitting: Family Medicine

## 2021-10-29 ENCOUNTER — Ambulatory Visit (INDEPENDENT_AMBULATORY_CARE_PROVIDER_SITE_OTHER): Payer: PPO | Admitting: Family Medicine

## 2021-10-29 VITALS — BP 129/72 | HR 81 | Temp 97.2°F | Ht 62.0 in | Wt 157.6 lb

## 2021-10-29 DIAGNOSIS — E663 Overweight: Secondary | ICD-10-CM

## 2021-10-29 DIAGNOSIS — Z0001 Encounter for general adult medical examination with abnormal findings: Secondary | ICD-10-CM

## 2021-10-29 DIAGNOSIS — F339 Major depressive disorder, recurrent, unspecified: Secondary | ICD-10-CM

## 2021-10-29 DIAGNOSIS — Z789 Other specified health status: Secondary | ICD-10-CM

## 2021-10-29 DIAGNOSIS — I700 Atherosclerosis of aorta: Secondary | ICD-10-CM | POA: Diagnosis not present

## 2021-10-29 DIAGNOSIS — E782 Mixed hyperlipidemia: Secondary | ICD-10-CM | POA: Diagnosis not present

## 2021-10-29 DIAGNOSIS — E538 Deficiency of other specified B group vitamins: Secondary | ICD-10-CM

## 2021-10-29 DIAGNOSIS — E559 Vitamin D deficiency, unspecified: Secondary | ICD-10-CM

## 2021-10-29 DIAGNOSIS — Z6828 Body mass index (BMI) 28.0-28.9, adult: Secondary | ICD-10-CM

## 2021-10-29 DIAGNOSIS — M797 Fibromyalgia: Secondary | ICD-10-CM | POA: Diagnosis not present

## 2021-10-29 DIAGNOSIS — R739 Hyperglycemia, unspecified: Secondary | ICD-10-CM | POA: Diagnosis not present

## 2021-10-29 DIAGNOSIS — E042 Nontoxic multinodular goiter: Secondary | ICD-10-CM | POA: Diagnosis not present

## 2021-10-29 LAB — CBC
HCT: 41.1 % (ref 36.0–46.0)
Hemoglobin: 13.7 g/dL (ref 12.0–15.0)
MCHC: 33.3 g/dL (ref 30.0–36.0)
MCV: 92.8 fl (ref 78.0–100.0)
Platelets: 350 10*3/uL (ref 150.0–400.0)
RBC: 4.43 Mil/uL (ref 3.87–5.11)
RDW: 12.6 % (ref 11.5–15.5)
WBC: 8.8 10*3/uL (ref 4.0–10.5)

## 2021-10-29 LAB — LIPID PANEL
Cholesterol: 271 mg/dL — ABNORMAL HIGH (ref 0–200)
HDL: 68.1 mg/dL (ref >39.00)
LDL Cholesterol: 187 mg/dL — ABNORMAL HIGH (ref 0–99)
NonHDL: 202.64
Total CHOL/HDL Ratio: 4
Triglycerides: 78 mg/dL (ref 0.0–149.0)
VLDL: 15.6 mg/dL (ref 0.0–40.0)

## 2021-10-29 LAB — COMPREHENSIVE METABOLIC PANEL
ALT: 14 U/L (ref 0–35)
AST: 19 U/L (ref 0–37)
Albumin: 4.5 g/dL (ref 3.5–5.2)
Alkaline Phosphatase: 139 U/L — ABNORMAL HIGH (ref 39–117)
BUN: 19 mg/dL (ref 6–23)
CO2: 28 mEq/L (ref 19–32)
Calcium: 9.9 mg/dL (ref 8.4–10.5)
Chloride: 103 mEq/L (ref 96–112)
Creatinine, Ser: 0.81 mg/dL (ref 0.40–1.20)
GFR: 73.79 mL/min (ref >60.00)
Glucose, Bld: 76 mg/dL (ref 70–99)
Potassium: 4 mEq/L (ref 3.5–5.1)
Sodium: 141 mEq/L (ref 135–145)
Total Bilirubin: 0.6 mg/dL (ref 0.2–1.2)
Total Protein: 7.7 g/dL (ref 6.0–8.3)

## 2021-10-29 LAB — VITAMIN B12: Vitamin B-12: 272 pg/mL (ref 211–911)

## 2021-10-29 LAB — T4, FREE: Free T4: 0.83 ng/dL (ref 0.60–1.60)

## 2021-10-29 LAB — HEMOGLOBIN A1C: Hgb A1c MFr Bld: 5.4 % (ref 4.6–6.5)

## 2021-10-29 LAB — T3, FREE: T3, Free: 3.1 pg/mL (ref 2.3–4.2)

## 2021-10-29 LAB — TSH: TSH: 0.3 u[IU]/mL — ABNORMAL LOW (ref 0.35–5.50)

## 2021-10-29 LAB — VITAMIN D 25 HYDROXY (VIT D DEFICIENCY, FRACTURES): VITD: 38.71 ng/mL (ref 30.00–100.00)

## 2021-10-29 NOTE — Assessment & Plan Note (Signed)
Check vitamin D. 

## 2021-10-29 NOTE — Patient Instructions (Signed)
It was very nice to see you today!  We will check blood work today.  Continue working on diet and exercise.  Please get your covid, pneumonia, and shingles vaccines soon.  We will see you back in year for your next physical.  Come back sooner if needed.  Take care, Dr [name]  PLEASE NOTE:  If you had any lab tests please let us know if you have not heard back within a few days. You may see your results on mychart before we have a chance to review them but we will give you a call once they are reviewed by [place]. If we ordered any referrals today, please let us know if you have not heard from their office within the next week.   Please try these tips to maintain a healthy lifestyle:  Eat at least 3 REAL meals and 1-2 snacks per day.  Aim for no more than 5 hours between eating.  If you eat breakfast, please do so within one hour of getting up.   Each meal should contain half fruits/vegetables, one quarter protein, and one quarter carbs (no bigger than a computer mouse)  Cut down on sweet beverages. This includes juice, soda, and sweet tea.   Drink at least 1 glass of water with each meal and aim for at least 8 glasses per day  Exercise at least 150 minutes every week.    Preventive Care 90 Years and Older, Female Preventive care refers to lifestyle choices and visits with your health care provider that can promote health and wellness. Preventive care visits are also called wellness exams. What can I expect for my preventive care visit? Counseling Your health care provider may ask you questions about your: Medical history, including: Past medical problems. Family medical history. Pregnancy and menstrual history. History of falls. Current health, including: Memory and ability to understand (cognition). Emotional well-being. Home life and relationship well-being. Sexual activity and sexual health. Lifestyle, including: Alcohol, nicotine or tobacco, and drug use. Access to  firearms. Diet, exercise, and sleep habits. Work and work Statistician. Sunscreen use. Safety issues such as seatbelt and bike helmet use. Physical exam Your health care provider will check your: Height and weight. These may be used to calculate your BMI (body mass index). BMI is a measurement that tells if you are at a healthy weight. Waist circumference. This measures the distance around your waistline. This measurement also tells if you are at a healthy weight and may help predict your risk of certain diseases, such as type 2 diabetes and high blood pressure. Heart rate and blood pressure. Body temperature. Skin for abnormal spots. What immunizations do I need? Vaccines are usually given at various ages, according to a schedule. Your health care provider will recommend vaccines for you based on your age, medical history, and lifestyle or other factors, such as travel or where you work. What tests do I need? Screening Your health care provider may recommend screening tests for certain conditions. This may include: Lipid and cholesterol levels. Hepatitis C test. Hepatitis B test. HIV (human immunodeficiency virus) test. STI (sexually transmitted infection) testing, if you are at risk. Lung cancer screening. Colorectal cancer screening. Diabetes screening. This is done by checking your blood sugar (glucose) after you have not eaten for a while (fasting). Mammogram. Talk with your health care provider about how often you should have regular mammograms. BRCA-related cancer screening. This may be done if you have a family history of breast, ovarian, tubal, or peritoneal cancers.  Bone density scan. This is done to screen for osteoporosis. Talk with your health care provider about your test results, treatment options, and if necessary, the need for more tests. Follow these instructions at home: Eating and drinking  Eat a diet that includes fresh fruits and vegetables, whole grains, lean  protein, and low-fat dairy products. Limit your intake of foods with high amounts of sugar, saturated fats, and salt. Take vitamin and mineral supplements as recommended by your health care provider. Do not drink alcohol if your health care provider tells you not to drink. If you drink alcohol: Limit how much you have to 0-1 drink a day. Know how much alcohol is in your drink. In the [place] one drink equals one 12 oz bottle of beer (355 mL), one 5 oz glass of wine (148 mL), or one 1 oz glass of hard liquor (44 mL). Lifestyle Brush your teeth every morning and night with fluoride toothpaste. Floss one time each day. Exercise for at least 30 minutes 5 or more days each week. Do not use any products that contain nicotine or tobacco. These products include cigarettes, chewing tobacco, and vaping devices, such as e-cigarettes. If you need help quitting, ask your health care provider. Do not use drugs. If you are sexually active, practice safe sex. Use a condom or other form of protection in order to prevent STIs. Take aspirin only as told by your health care provider. Make sure that you understand how much to take and what form to take. Work with your health care provider to find out whether it is safe and beneficial for you to take aspirin daily. Ask your health care provider if you need to take a cholesterol-lowering medicine (statin). Find healthy ways to manage stress, such as: Meditation, yoga, or listening to music. Journaling. Talking to a trusted person. Spending time with friends and family. Minimize exposure to UV radiation to reduce your risk of skin cancer. Safety Always wear your seat belt while driving or riding in a vehicle. Do not drive: If you have been drinking alcohol. Do not ride with someone who has been drinking. When you are tired or distracted. While texting. If you have been using any mind-altering substances or drugs. Wear a helmet and other protective equipment during  sports activities. If you have firearms in your house, make sure you follow all gun safety procedures. What's next? Visit your health care provider once a year for an annual wellness visit. Ask your health care provider how often you should have your eyes and teeth checked. Stay up to date on all vaccines. This information is not intended to replace advice given to you by your health care provider. Make sure you discuss any questions you have with your health care provider. Document Revised: 06/09/2021 Document Reviewed: 06/09/2021 Elsevier Patient Education  [place].

## 2021-10-29 NOTE — Assessment & Plan Note (Signed)
Follows with endocrinology.  We will check TSH, free T4, free T3.

## 2021-10-29 NOTE — Assessment & Plan Note (Signed)
Stable off medications. PHQ score 0.

## 2021-10-29 NOTE — Assessment & Plan Note (Signed)
Not able to tolerate statins.  She is working on style modifications.

## 2021-10-29 NOTE — Progress Notes (Signed)
Chief Complaint:  Natalie Erickson is a 70 y.o. female who presents today for her annual comprehensive physical exam.    Assessment/Plan:  Chronic Problems Addressed Today: B12 deficiency Check B12.   Vitamin D deficiency Check vitamin D.  Multiple thyroid nodules Follows with endocrinology.  We will check TSH, free T4, free T3.  Statin intolerance May consider alternative to statins depending on lipid panel.  Aortic atherosclerosis ([place]) Not able to tolerate statins.  She is working on style modifications.  Depression, recurrent ([place]) Stable off medications. PHQ score 0.   Fibromyalgia Stable with lifestyle modifications.   Mixed hyperlipidemia Check lipids. Cannot tolerate statins.    Body mass index is 28.83 kg/m. / Overweight  BMI Metric Follow Up - 10/29/21 1115       BMI Metric Follow Up-Please document annually   BMI Metric Follow Up Education provided              Preventative Healthcare: Check Labs. UTD on mammogram. Due for COVID booster, pneumonia vaccine, and shingles vaccine.  She will follow-up with pharmacy to have these done. No longer needs colon cancer screening per patient.   Patient Counseling(The following topics were reviewed and/or handout was given):  -Nutrition: Stressed importance of moderation in sodium/caffeine intake, saturated fat and cholesterol, caloric balance, sufficient intake of fresh fruits, vegetables, and fiber.  -Stressed the importance of regular exercise.   -Substance Abuse: Discussed cessation/primary prevention of tobacco, alcohol, or other drug use; driving or other dangerous activities under the influence; availability of treatment for abuse.   -Injury prevention: Discussed safety belts, safety helmets, smoke detector, smoking near bedding or upholstery.   -Sexuality: Discussed sexually transmitted diseases, partner selection, use of condoms, avoidance of unintended pregnancy and contraceptive alternatives.    -Dental health: Discussed importance of regular tooth brushing, flossing, and dental visits.  -Health maintenance and immunizations reviewed. Please refer to Health maintenance section.  Return to care in 1 year for next preventative visit.     Subjective:  HPI:  She has been dealing with a UTI due to post-hemorrhoidectomy, but has been managing this well. She states that dairy, carbs and sweets causes flareups/irritation in regards to the hemorrhoidectomy. Because of this, she has to be careful with exercise, but is able to still do yoga, walking and [place] chi.  Examination of thyroid nodules via ultrasound came back with no abnormalities/changes. The last colonoscopy she had also showed no polyps or other abnormalities.  She admits there was a small change in vision noted when visiting ophthalmologist and cataract development, and she notes  some ringing being present in ears.  She mentioned that her mother passed away this 09/01/2023, but that she has been handling the process well.   Lifestyle Diet: Has been making efforts to improve her diet.  Exercise: Walks, [place] chi, yoga.  Depression screen [facility] 2/9 04/12/2021  Decreased Interest 0  Down, Depressed, Hopeless 0  PHQ - 2 Score 0  Altered sleeping -  Tired, decreased energy -  Change in appetite -  Feeling bad or failure about yourself  -  Trouble concentrating -  Moving slowly or fidgety/restless -  Suicidal thoughts -  PHQ-9 Score -  Difficult doing work/chores -  Some recent data might be hidden    Health Maintenance Due  Topic Date Due   Pneumonia Vaccine 5+ Years old (1 - PCV) Never done   Zoster Vaccines- Shingrix (1 of 2) Never done   COVID-19 Vaccine (3 - Booster  for [place] series) 05/05/2020     ROS: Per HPI, otherwise a complete review of systems was negative.   PMH:  The following were reviewed and entered/updated in epic: Past Medical History:  Diagnosis Date   AC (acromioclavicular) joint bone spurs     c 5 and c6 and thoracic   Allergy    food allergies   Anxiety 04/06/2017   Benign thyroid cyst sept 2020 dx   Cataract    bilateral   Cystitis    Depression    Dry eyes 04/06/2017   Elevated BP without diagnosis of hypertension 04/06/2017   lost weight >> BP improved   Fibromyalgia    Floaters    both eyes   Heart murmur    hx of 2 Echocardiograms in [place] (pt was told they were normal)   Hiatal hernia    Hip pain    and groin pain for a while   History of colon polyps    History of echocardiogram    Echo 9/19: mild LVH, EF 60-65, no RWMA, Gr 1 DD, trivial MR/TR, PASP 16   History of kidney stones    History of Lyme disease    Hypercalcemia    causes kidney stones, none recent   IBS (irritable bowel syndrome)    Mixed hyperlipidemia 04/06/2017   intol to statins due to myalgias; never tried Zetia   Occipital neuralgia    Sleep apnea    mild to moderate , uses cpap   TMJ (dislocation of temporomandibular joint)    UTI (urinary tract infection) finishes antibiotic 03-27-2020   Vasomotor rhinitis    Patient Active Problem List   Diagnosis Date Noted   Depression, recurrent ([place]) 10/28/2020   Aortic atherosclerosis ([place]) 10/28/2020   Statin intolerance 10/28/2020   OSA on CPAP 10/28/2020   Multiple thyroid nodules 10/28/2020   Vitamin D deficiency 10/28/2020   B12 deficiency 10/28/2020   DDD (degenerative disc disease), cervical 01/08/2020   Bilateral occipital neuralgia 01/08/2020   Somatization disorder 11/14/2018   Posterior vitreous detachment of right eye 09/10/2018   History of IBS 01/30/2018   Fibromyalgia 06/04/2017   Chronic fatigue 06/04/2017   ANA positive 05/20/2017   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   Interstitial cystitis 05/20/2017   Dyspepsia 05/20/2017   Mixed hyperlipidemia 04/06/2017   Anxiety 04/06/2017   Past Surgical History:  Procedure Laterality Date   APPENDECTOMY  1959   COLONOSCOPY  2016   [place]   COLONOSCOPY  2011   in  [place] WITH HEMORRHOIDECTOMY N/A 04/02/2020   Procedure: ANORECTAL EXAM UNDER ANESTHESIA WITH HEMORRHOIDECTOMY, HEMORRHOIDAL LIGATION;  Surgeon: [name], MD;  Location: [facility];  Service: General;  Laterality: N/A;  GENERAL AND LOCAL   left ovarin cyst removed  age 69's   grapefruit sized, exp laparotomy   POLYPECTOMY     UPPER GASTROINTESTINAL ENDOSCOPY     UPPER GI ENDOSCOPY      Family History  Problem Relation Age of Onset   Healthy Mother    Hypertension Mother    Healthy Father    Lung cancer Father    Breast cancer Sister    Diabetes Paternal Grandmother    Colon cancer Neg Hx    Stomach cancer Neg Hx    Heart attack Neg Hx    Heart failure Neg Hx    Pancreatic cancer Neg Hx    Colon polyps Neg Hx    Esophageal cancer Neg Hx  Rectal cancer Neg Hx     Medications- reviewed and updated Current Outpatient Medications  Medication Sig Dispense Refill   acetaminophen (TYLENOL) 500 MG tablet Take 1,000 mg by mouth every 6 (six) hours as needed for headache (pain).     AMBULATORY NON FORMULARY MEDICATION Take 1 tablet by mouth as needed. Medication Name: [name]     Cholecalciferol (VITAMIN D) 2000 units tablet Take 2,000 Units by mouth daily.      Magnesium Glycinate POWD 120 mg by Does not apply route as needed.      OLIVE LEAF EXTRACT PO Take by mouth.     Polyethyl Glycol-Propyl Glycol (SYSTANE ULTRA OP) Apply to eye at bedtime.      polyethylene glycol powder (GLYCOLAX/MIRALAX) 17 GM/SCOOP powder Take 17-34 g by mouth daily.     Probiotic Product ([place]) Take by mouth daily as needed. (Patient not taking: Reported on 10/29/2021)     No current facility-administered medications for this visit.    Allergies-reviewed and updated Allergies  Allergen Reactions   Diltiazem Shortness Of Breath   Metoprolol Shortness Of Breath   Statins Tinitus    Achy joints, muscle aches Achy joints,  muscle aches   Chocolate Other (See Comments)    migraine   Cocoa Other (See Comments)    migraine migraine   Codeine Nausea And Vomiting   Cranberry Other (See Comments)    Cystitis   Latex Rash   Monosodium Glutamate Other (See Comments)    Migraine   Penicillins Rash    Has patient had a PCN reaction causing immediate rash, facial/tongue/throat swelling, SOB or lightheadedness with hypotension: Yes Has patient had a PCN reaction causing severe rash involving mucus membranes or skin necrosis: No Has patient had a PCN reaction that required hospitalization No Has patient had a PCN reaction occurring within the last 10 years: No If all of the above answers are "NO", then may proceed with Cephalosporin use.    Antihistamines, Loratadine-Type     Throat swelling   Carvedilol Other (See Comments)    N&N, SOB , Head ache and joint pain.    Ginger    Gluten Meal    Lexapro [Escitalopram]     GI    Montelukast Other (See Comments)    Throat swelling   Other Swelling    Throat swelling   Prednisone Swelling    Throat swelling (no difficulty breathing)   Sulfites Other (See Comments)    Nausea and headaches   Vanilla    Whey    Zofran [Ondansetron Hcl] Other (See Comments)    Muscle cramps, leg tingling   Azithromycin Other (See Comments) and Diarrhea   Eggs Or Egg-Derived Products Other (See Comments)    States exacerbates her IBS symptoms    Social History   Socioeconomic History   Marital status: Married    Spouse name: Not on file   Number of children: 2   Years of education: RN   Highest education level: Not on file  Occupational History   Occupation: Retired  Tobacco Use   Smoking status: Never   Smokeless tobacco: Never  Vaping Use   Vaping Use: Never used  Substance and Sexual Activity   Alcohol use: No   Drug use: No   Sexual activity: Yes  Other Topics Concern   Not on file  Social History Narrative   ** Merged History Encounter **       Lives at  home w/  her husband and family Right-handed Caffeine: none since March 2018 2 sons 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 Wt Readings from Last 3 Encounters:  10/29/21 157 lb 9.6 oz (71.5 kg)  09/16/21 155 lb 3.2 oz (70.4 kg)  08/05/21 155 lb 8 oz (70.5 kg)   Gen: NAD, resting comfortably HEENT: TMs normal bilaterally. OP clear. No thyromegaly noted.  CV: RRR with no murmurs appreciated Pulm: NWOB, CTAB with no crackles, wheezes, or rhonchi GI: Normal bowel sounds present. Soft, Nontender, Nondistended. MSK: no edema, cyanosis, or clubbing noted Skin: warm, dry Neuro: CN2-12 grossly intact. Strength 5/5 in upper and lower extremities. Reflexes symmetric and intact bilaterally.  Psych: Normal affect and thought content     I,Jordan Kelly,acting as a scribe for Dimas Chyle, MD.,have documented all relevant documentation on the behalf of Dimas Chyle, MD,as directed by  Dimas Chyle, MD while in the presence of Dimas Chyle, MD.  I, Dimas Chyle, MD, have reviewed all documentation for this visit. The documentation on 10/29/21 for the exam, diagnosis, procedures, and orders are all accurate and complete.  [name]. [name], MD 10/29/2021 11:16 AM

## 2021-10-29 NOTE — Assessment & Plan Note (Signed)
Check B12 

## 2021-10-29 NOTE — Assessment & Plan Note (Signed)
Check lipids. Cannot tolerate statins.

## 2021-10-29 NOTE — Assessment & Plan Note (Signed)
Stable with lifestyle modifications.

## 2021-10-29 NOTE — Assessment & Plan Note (Signed)
May consider alternative to statins depending on lipid panel.

## 2021-10-30 IMAGING — MG MM DIGITAL SCREENING BILAT W/ TOMO AND CAD
8 series · 8 of 24 positions shown · non-contrast
Comparison: Previous exam(s).

CLINICAL DATA: Screening.

EXAM:
DIGITAL SCREENING BILATERAL MAMMOGRAM WITH TOMOSYNTHESIS AND CAD
TECHNIQUE: Bilateral screening digital craniocaudal and mediolateral oblique
mammograms were obtained. Bilateral screening digital breast
tomosynthesis was performed. The images were evaluated with
computer-aided detection.

[R MLO synth-2D]
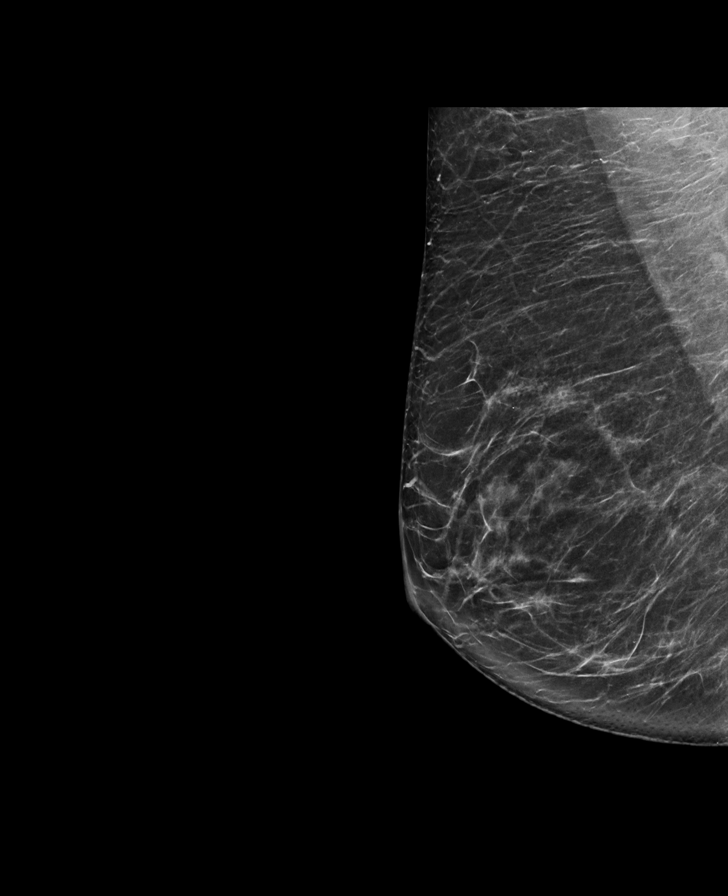

[L CC synth-2D]
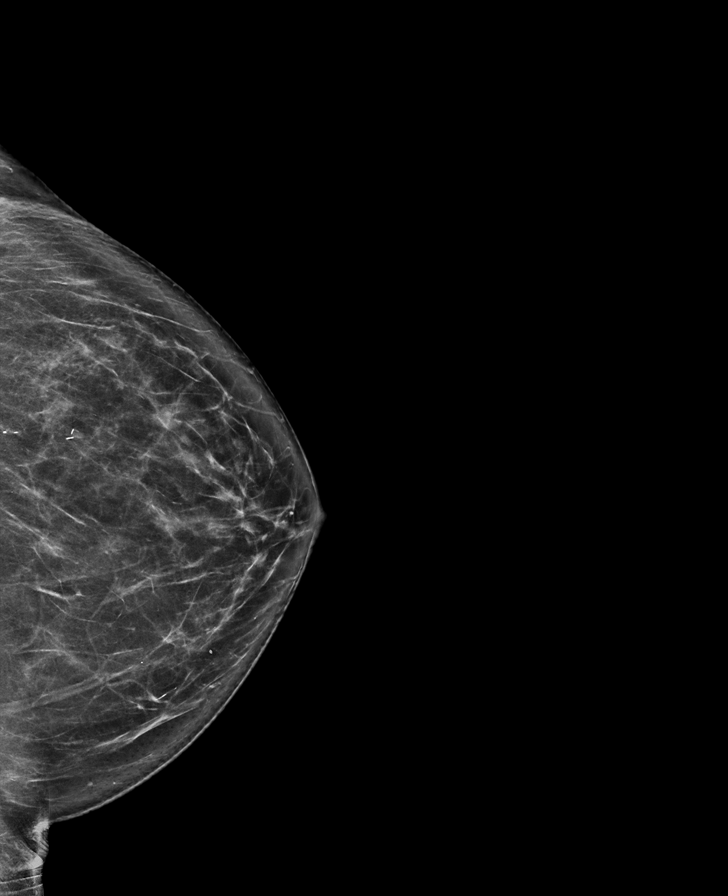

[R CC synth-2D]
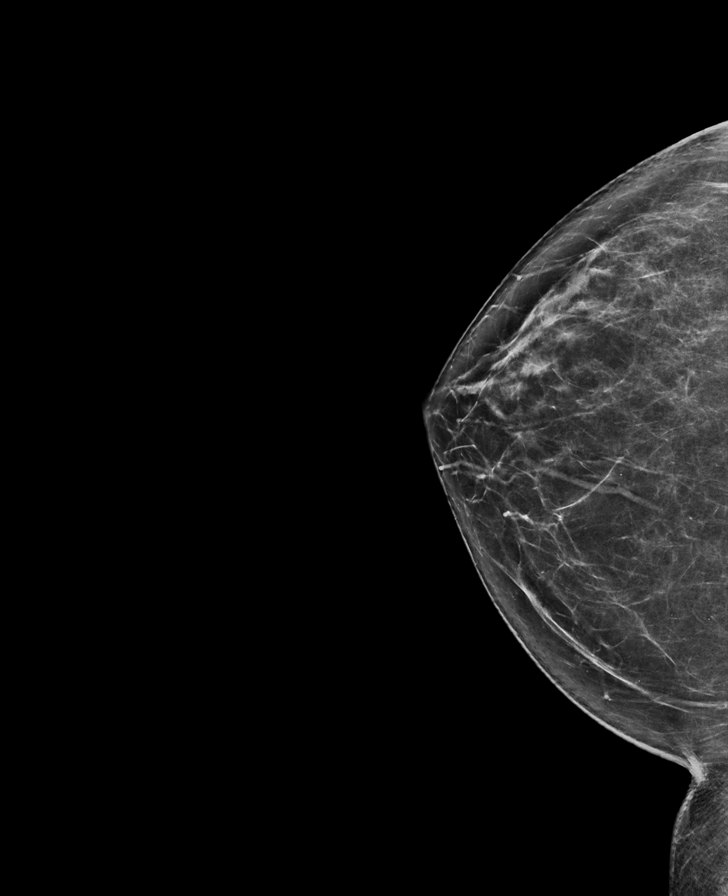

[L MLO synth-2D]
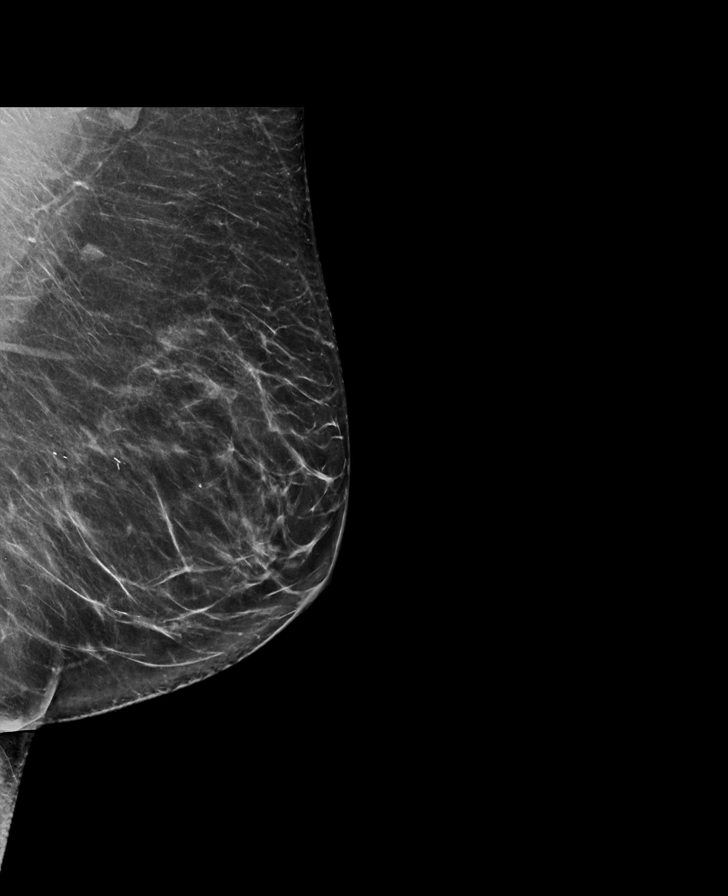

[R CC tomo · tomo slice 38/75.0]
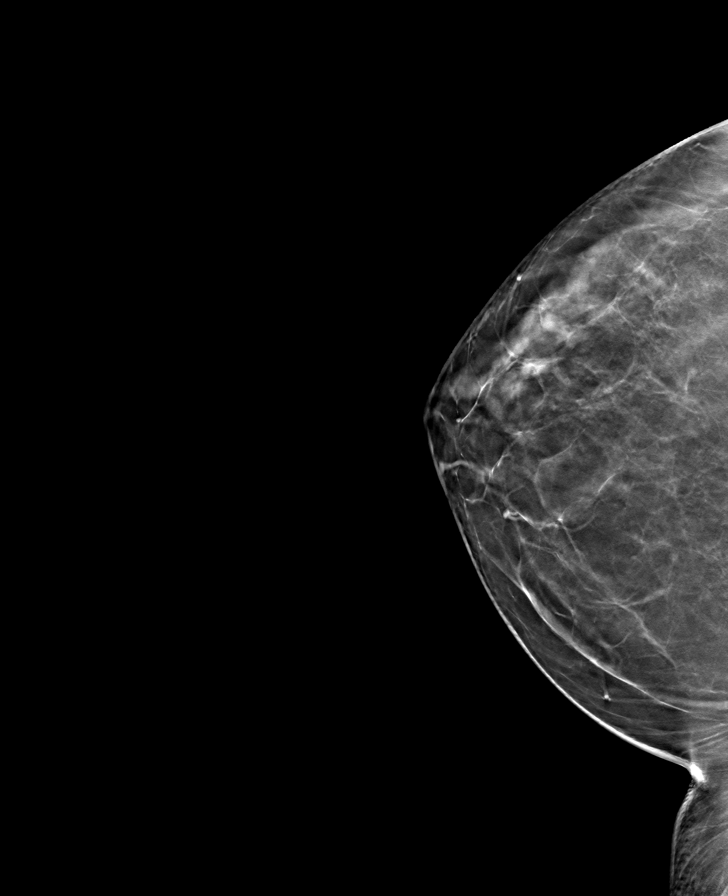

[R MLO tomo · tomo slice 39/78.0]
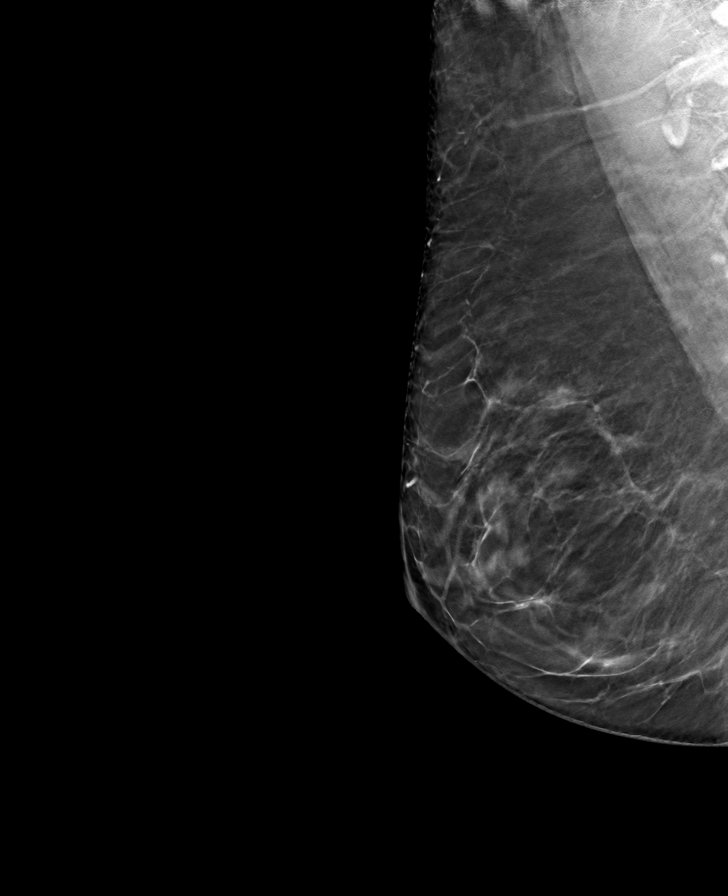

[L MLO tomo · tomo slice 42/83.0]
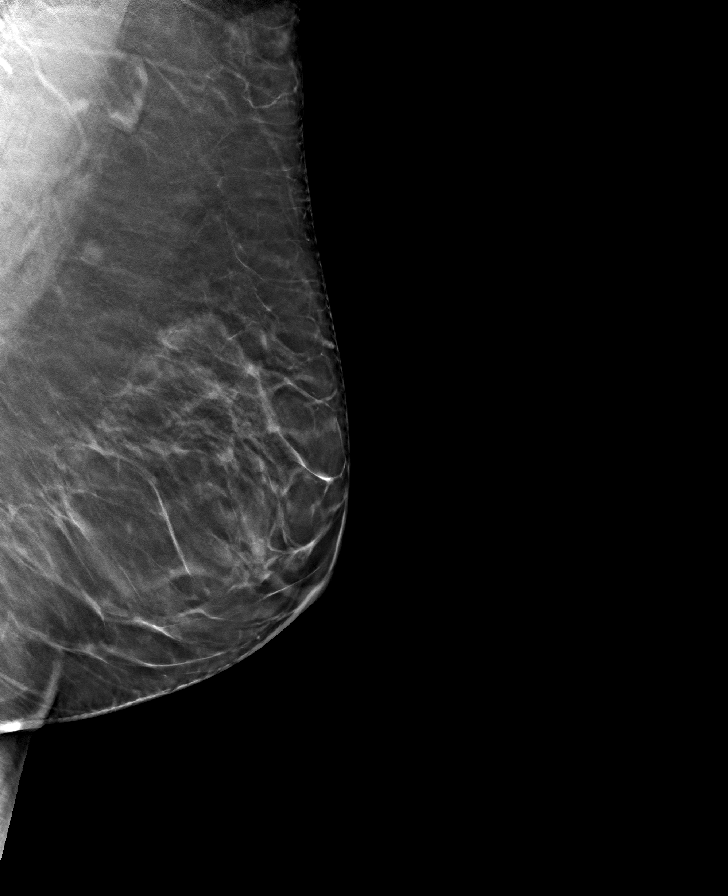

[L CC tomo · tomo slice 40/79.0]
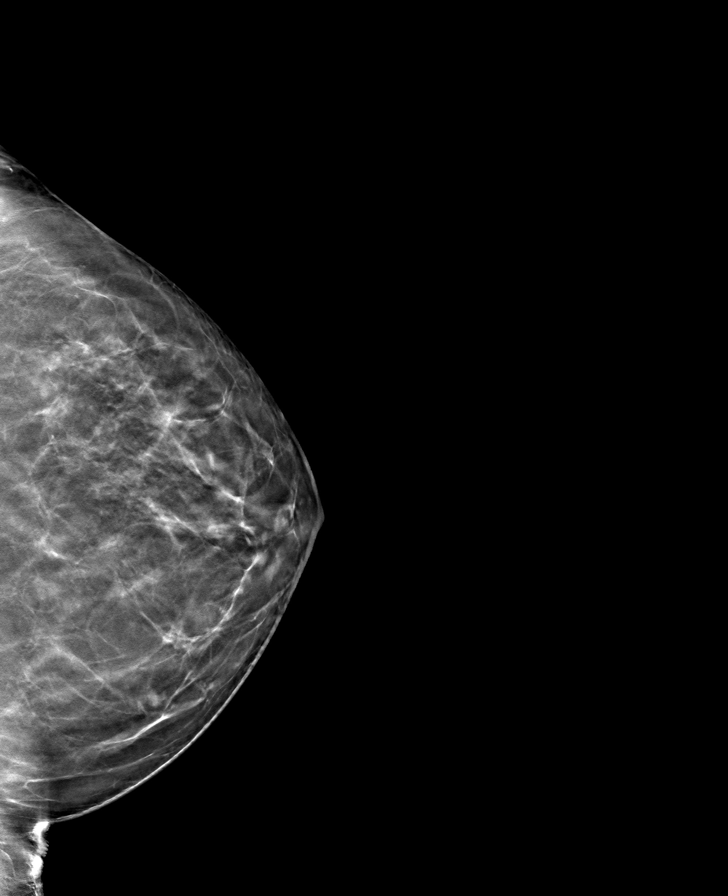

[8 of 24 positions shown; findings below may reference images not displayed]

ACR Breast Density Category b: There are scattered areas of
fibroglandular density.
FINDINGS: There are no findings suspicious for malignancy. The images were
evaluated with computer-aided detection.
IMPRESSION: No mammographic evidence of malignancy. A result letter of this
screening mammogram will be mailed directly to the patient.

RECOMMENDATION:
Screening mammogram in one year. (Code:WJ-[id number])

BI-RADS CATEGORY  1: Negative.

## 2021-11-01 NOTE — Progress Notes (Signed)
Please inform patient of the following:  Her cholesterol is elevated - much more than last year and is trending up the last few years. I know she has not tolerated statins in the past. Recommend referral to lipid clinic if she is interested. Would like to get her numbers lower to reduce risk of heart attack and stroke.   Her thyroid numbers are borderline but stable. We can recheck again in 3-6 months.  All of her other labs are NORMAL and we can recheck in a year.  [name]. [name], MD 11/01/2021 8:51 AM

## 2021-11-05 ENCOUNTER — Telehealth: Payer: Self-pay

## 2021-11-05 NOTE — Telephone Encounter (Signed)
Pt called about lab results. Please Advise.

## 2021-11-08 NOTE — Telephone Encounter (Signed)
Lab printed 

## 2021-11-08 NOTE — Telephone Encounter (Signed)
Pt called wants a print out of lab work so that she can pick them up. Please Advise.

## 2021-11-09 ENCOUNTER — Encounter: Payer: PPO | Admitting: Physical Therapy

## 2021-11-09 ENCOUNTER — Telehealth: Payer: Self-pay

## 2021-11-09 NOTE — Telephone Encounter (Signed)
Pt came in yesterday and picked up her lab results. After reviewing results, pt does want the referral placed for the lipid clinic. Can referral be placed? Please Advise.

## 2021-11-11 ENCOUNTER — Other Ambulatory Visit: Payer: Self-pay | Admitting: *Deleted

## 2021-11-11 DIAGNOSIS — E782 Mixed hyperlipidemia: Secondary | ICD-10-CM

## 2021-11-11 NOTE — Telephone Encounter (Signed)
Referral placed.

## 2021-11-23 ENCOUNTER — Encounter: Payer: PPO | Admitting: Physical Therapy

## 2021-12-13 NOTE — Progress Notes (Signed)
Chief Complaint  Patient presents with   Obstructive Sleep Apnea    Rm 1, alone. Here for yearly CPAP f/u. Pt reports having to take it off after 4-5 hrs. Pt reports her IBS is acting up. Mask can be uncomfortable sometimes.     HISTORY OF PRESENT ILLNESS: 12/14/21 ALL: Anel returns for follow up for OSA on CPAP. She continues to do fairly well. She has not used CPAP for as long as she used to due to more IBS symptoms. She feels better when she lies on her left side. She is caring for her aging dog. She is up and doesn't with her at night. She is using a nasal pillow. She feels that she get frustrated with it but not interested in trying a new mask.   ON is well managed. She uses gabapentin only as needed.     12/10/2020 ALL:  Yvana Samonte is a 70 y.o. female here today for follow up for OSA on CPAP therapy.  She reports that she is doing very well in therapy.  She does feel that it helps with daytime sleepiness.  She is currently seeing [facility], Dr. Sabra Heck, who is retiring at the end of the year.  She is treated with low-dose gabapentin for occipital neuralgia.  She reports that she is doing very well.  She would like to continue follow-up for both sleep and occipital neuralgia with me.  Compliance report dated 11/09/2020 through 12/08/2020 reveals that she used CPAP 28 of the past 30 days for compliance of 93%.  She used CPAP greater than 4 hours 27 of the past 30 days for compliance of 90%.  Average usage on days used was 6 hours and 55 minutes.  Residual AHI was 0.9 on 4 to 9 cm of water and an EPR of 1.  There was no significant leak noted.   HISTORY (copied from previous note)  Deysi Soldo is a 70 y.o. female here today for follow up of OSA on CPAP.  [place] is doing very well on CPAP therapy.  She is using CPAP nightly.  She does note significant improvement in daytime sleepiness.  She has less fatigue throughout the day.  She notes improvement in occipital  neuralgia.  She is currently managed by Dr. Sabra Heck, [facility].  Her only concern is that occasionally she will wake up at night with what she describes as gas pains.  She is also noted belching.  She is currently using a nasal pillow.  She does have a chinstrap but has not used this.  She is concerned that pressure settings are too high.   Compliance report dated 11/10/2019 through 12/09/2019 revealed that she is using CPAP every night for compliance of 100%.  Every night she used CPAP greater than 4 hours for compliance of 100%.  On average she is using CPAP 7 hours and 49 minutes.  Residual AHI 0.6 on 4 to 9 cm of water.  Pressure in the 95th percentile of 8.2 with maximum of 8.8.  There is no significant leak.   HISTORY: (copied from Dr Guadelupe Sabin note on 08/12/2019)   Dear Dr. Juleen China,    I saw your patient, Aracelie Addis, upon your kind request to my sleep clinic today for initial consultation of her sleep disorder, in particular, concern for sleep disordered breathing.  The patient is unaccompanied today.  As you know, Ms. Provencal is a 70 year old right-handed woman with an underlying medical history of irritable bowel syndrome,  history of heart murmur, fibromyalgia, depression, anxiety, and hyperlipidemia, who reports snoring and excessive daytime somnolence.  I reviewed your office note from 07/12/2019.  She has had some morning headaches.  These are dull and achy.  She had seen Dr. Jaynee Eagles in our office in 2018 for headaches. Her Epworth sleepiness score is 9 out of 24, fatigue severity score is 52 out of 63.  She reports that her husband has noted some snorting sounds, these became worse when she was tried on baclofen for neck pain.  She had a recent C-spine MRI and x-ray in July 2020.  She brought copies of the results for my review.  Her cervical spine x-ray showed multilevel degenerative disc disease.  Her MRI neck showed multilevel cervical spondylosis, most pronounced at C5-6.  She had mild canal  stenosis and moderate left foraminal narrowing she was noted to have a goiter.  She is going to see a new orthopedic doctor this week. She reports that she is supposed to have a thyroid biopsy.  She has had some depressive symptoms.  She reports side effects on antidepressant medications.  She has intermittent restless leg symptoms and takes magnesium for these symptoms as well as cramping.  She does not have nightly restless legs symptoms.  She does have nocturia once or twice per average night.  She lives with her husband.  They live with their son and his family including wife and 2 grandchildren.  She has another son in [place].  Her son that lives here has sleep apnea and has a CPAP machine.  She reports a bedtime around 930 and rise time between 630 and 7 AM.  Years ago she tried a custom-made bite guard which broke.  She did not think it helped.  She does not believe it was made for snoring or for sleep apnea but more for grinding.  She does not drink caffeine or alcohol.  She does not smoke.     REVIEW OF SYSTEMS: Out of a complete 14 system review of symptoms, the patient complains only of the following symptoms, occipital neuralgia and all other reviewed systems are negative.  ESS: 3   ALLERGIES: Allergies  Allergen Reactions   Diltiazem Shortness Of Breath   Metoprolol Shortness Of Breath   Statins Tinitus    Achy joints, muscle aches Achy joints, muscle aches   Chocolate Other (See Comments)    migraine   Cocoa Other (See Comments)    migraine migraine   Codeine Nausea And Vomiting   Cranberry Other (See Comments)    Cystitis   Latex Rash   Monosodium Glutamate Other (See Comments)    Migraine   Penicillins Rash    Has patient had a PCN reaction causing immediate rash, facial/tongue/throat swelling, SOB or lightheadedness with hypotension: Yes Has patient had a PCN reaction causing severe rash involving mucus membranes or skin necrosis: No Has patient had a PCN reaction  that required hospitalization No Has patient had a PCN reaction occurring within the last 10 years: No If all of the above answers are "NO", then may proceed with Cephalosporin use.    Antihistamines, Loratadine-Type     Throat swelling   Carvedilol Other (See Comments)    N&N, SOB , Head ache and joint pain.    Ginger    Gluten Meal    Lexapro [Escitalopram]     GI    Montelukast Other (See Comments)    Throat swelling   Other Swelling    Throat  swelling   Prednisone Swelling    Throat swelling (no difficulty breathing)   Sulfites Other (See Comments)    Nausea and headaches   Vanilla    Whey    Zofran [Ondansetron Hcl] Other (See Comments)    Muscle cramps, leg tingling   Azithromycin Other (See Comments) and Diarrhea   Eggs Or Egg-Derived Products Other (See Comments)    States exacerbates her IBS symptoms     HOME MEDICATIONS: Outpatient Medications Prior to Visit  Medication Sig Dispense Refill   acetaminophen (TYLENOL) 500 MG tablet Take 1,000 mg by mouth every 6 (six) hours as needed for headache (pain).     AMBULATORY NON FORMULARY MEDICATION Take 1 tablet by mouth as needed. Medication Name: [name]     Cholecalciferol (VITAMIN D) 2000 units tablet Take 2,000 Units by mouth daily.      Magnesium Glycinate POWD 120 mg by Does not apply route as needed.      OLIVE LEAF EXTRACT PO Take by mouth.     Polyethyl Glycol-Propyl Glycol (SYSTANE ULTRA OP) Apply to eye at bedtime.      polyethylene glycol powder (GLYCOLAX/MIRALAX) 17 GM/SCOOP powder Take 17-34 g by mouth as needed.     Probiotic Product ([place]) Take by mouth daily as needed.     No facility-administered medications prior to visit.     PAST MEDICAL HISTORY: Past Medical History:  Diagnosis Date   AC (acromioclavicular) joint bone spurs    c 5 and c6 and thoracic   Allergy    food allergies   Anxiety 04/06/2017   Benign thyroid cyst sept 2020 dx   Cataract    bilateral    Cystitis    Depression    Dry eyes 04/06/2017   Elevated BP without diagnosis of hypertension 04/06/2017   lost weight >> BP improved   Fibromyalgia    Floaters    both eyes   Heart murmur    hx of 2 Echocardiograms in [place] (pt was told they were normal)   Hiatal hernia    Hip pain    and groin pain for a while   History of colon polyps    History of echocardiogram    Echo 9/19: mild LVH, EF 60-65, no RWMA, Gr 1 DD, trivial MR/TR, PASP 16   History of kidney stones    History of Lyme disease    Hypercalcemia    causes kidney stones, none recent   IBS (irritable bowel syndrome)    Mixed hyperlipidemia 04/06/2017   intol to statins due to myalgias; never tried Zetia   Occipital neuralgia    Sleep apnea    mild to moderate , uses cpap   TMJ (dislocation of temporomandibular joint)    UTI (urinary tract infection) finishes antibiotic 03-27-2020   Vasomotor rhinitis      PAST SURGICAL HISTORY: Past Surgical History:  Procedure Laterality Date   APPENDECTOMY  1959   COLONOSCOPY  2016   [place]   COLONOSCOPY  2011   in [place] N/A 04/02/2020   Procedure: ANORECTAL EXAM UNDER ANESTHESIA WITH HEMORRHOIDECTOMY, HEMORRHOIDAL LIGATION;  Surgeon: [name], MD;  Location: [facility];  Service: General;  Laterality: N/A;  GENERAL AND LOCAL   left ovarin cyst removed  age 61's   grapefruit sized, exp laparotomy   POLYPECTOMY     UPPER GASTROINTESTINAL ENDOSCOPY     UPPER GI ENDOSCOPY  FAMILY HISTORY: Family History  Problem Relation Age of Onset   Healthy Mother    Hypertension Mother    Healthy Father    Lung cancer Father    Breast cancer Sister    Diabetes Paternal Grandmother    Colon cancer Neg Hx    Stomach cancer Neg Hx    Heart attack Neg Hx    Heart failure Neg Hx    Pancreatic cancer Neg Hx    Colon polyps Neg Hx    Esophageal cancer Neg Hx    Rectal cancer Neg Hx       SOCIAL HISTORY: Social History   Socioeconomic History   Marital status: Married    Spouse name: Not on file   Number of children: 2   Years of education: RN   Highest education level: Not on file  Occupational History   Occupation: Retired  Tobacco Use   Smoking status: Never   Smokeless tobacco: Never  Vaping Use   Vaping Use: Never used  Substance and Sexual Activity   Alcohol use: No   Drug use: No   Sexual activity: Yes  Other Topics Concern   Not on file  Social History Narrative   ** Merged History Encounter **       Lives at home w/ her husband and family Right-handed Caffeine: none since March 2018 2 sons 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   Generalized: Well developed, in no acute distress  Cardiology: normal rate and rhythm, no murmur auscultated  Respiratory: clear to auscultation bilaterally    Neurological examination  Mentation: Alert oriented to time, place, history taking. Follows all commands speech and language fluent Cranial nerve II-XII: Pupils were equal round reactive to light. Extraocular movements were full, visual field were full on confrontational test. Facial sensation and strength were normal. Head turning and shoulder shrug  were normal and symmetric. Motor: The motor testing reveals 5 over 5 strength of all 4 extremities. Good symmetric motor tone is noted throughout.  Gait and station: Gait is normal.     DIAGNOSTIC DATA (LABS, IMAGING, TESTING) - I reviewed patient records, labs,  notes, testing and imaging myself where available.  Lab Results  Component Value Date   WBC 8.8 10/29/2021   HGB 13.7 10/29/2021   HCT 41.1 10/29/2021   MCV 92.8 10/29/2021   PLT 350.0 10/29/2021      Component Value Date/Time   NA 141 10/29/2021 1127   K 4.0 10/29/2021 1127   CL 103 10/29/2021 1127   CO2 28 10/29/2021 1127   GLUCOSE 76 10/29/2021 1127   BUN 19 10/29/2021 1127   CREATININE 0.81 10/29/2021 1127   CREATININE 0.70 10/28/2020 0927   CALCIUM 9.9 10/29/2021 1127   PROT 7.7 10/29/2021 1127   PROT 7.1 07/10/2017 1058   ALBUMIN 4.5 10/29/2021 1127   AST 19 10/29/2021 1127   ALT 14 10/29/2021 1127   ALKPHOS 139 (H) 10/29/2021 1127  BILITOT 0.6 10/29/2021 1127   GFRNONAA >60 08/28/2019 1846   GFRAA >60 08/28/2019 1846   Lab Results  Component Value Date   CHOL 271 (H) 10/29/2021   HDL 68.10 10/29/2021   LDLCALC 187 (H) 10/29/2021   TRIG 78.0 10/29/2021   CHOLHDL 4 10/29/2021   Lab Results  Component Value Date   HGBA1C 5.4 10/29/2021   Lab Results  Component Value Date   VITAMINB12 272 10/29/2021   Lab Results  Component Value Date   TSH 0.30 (L) 10/29/2021    No flowsheet data found.   ASSESSMENT AND PLAN  70 y.o. year old female  has a past medical history of AC (acromioclavicular) joint bone spurs, Allergy, Anxiety (04/06/2017), Benign thyroid cyst (sept 2020 dx), Cataract, Cystitis, Depression, Dry eyes (04/06/2017), Elevated BP without diagnosis of hypertension (04/06/2017), Fibromyalgia, Floaters, Heart murmur, Hiatal hernia, Hip pain, History of colon polyps, History of echocardiogram, History of kidney stones, History of Lyme disease, Hypercalcemia, IBS (irritable bowel syndrome), Mixed hyperlipidemia (04/06/2017), Occipital neuralgia, Sleep apnea, TMJ (dislocation of temporomandibular joint), UTI (urinary tract infection) (finishes antibiotic 03-27-2020), and Vasomotor rhinitis. here with   OSA on CPAP - Plan: For home use only DME continuous  positive airway pressure (CPAP)  Breeana is doing very well from a sleep apnea standpoint.  Compliance report reveals excellent compliance.  She was encouraged to continue using CPAP nightly and for greater than 4 hours each night. She will let me know if she wishes to try a new mask. She will continue close follow up with [facility] for ON management (per her wish). We will see her back in 1 year, sooner if needed.  She verbalizes understanding and agreement with this plan.    Orders Placed This Encounter  Procedures   For home use only DME continuous positive airway pressure (CPAP)    Supplies    Order Specific Question:   Length of Need    Answer:   Lifetime    Order Specific Question:   Patient has OSA or probable OSA    Answer:   Yes    Order Specific Question:   Is the patient currently using CPAP in the home    Answer:   Yes    Order Specific Question:   Settings    Answer:   Other see comments    Order Specific Question:   CPAP supplies needed    Answer:   Mask, headgear, cushions, filters, heated tubing and water chamber      Debbora Presto, MSN, FNP-C 12/14/2021, 10:49 AM  [facility] [address] [place], [place] [address] [phone]

## 2021-12-13 NOTE — Patient Instructions (Addendum)
Please continue using your CPAP regularly. While your insurance requires that you use CPAP at least 4 hours each night on 70% of the nights, I recommend, that you not skip any nights and use it throughout the night if you can. Getting used to CPAP and staying with the treatment long term does take time and patience and discipline. Untreated obstructive sleep apnea when it is moderate to severe can have an adverse impact on cardiovascular health and raise her risk for heart disease, arrhythmias, hypertension, congestive heart failure, stroke and diabetes. Untreated obstructive sleep apnea causes sleep disruption, nonrestorative sleep, and sleep deprivation. This can have an impact on your day to day functioning and cause daytime sleepiness and impairment of cognitive function, memory loss, mood disturbance, and problems focussing. Using CPAP regularly can improve these symptoms.  Please let me know if you want to change your mask.  Follow up in 1 year

## 2021-12-14 ENCOUNTER — Ambulatory Visit: Payer: PPO | Admitting: Family Medicine

## 2021-12-14 ENCOUNTER — Other Ambulatory Visit: Payer: Self-pay

## 2021-12-14 ENCOUNTER — Encounter: Payer: Self-pay | Admitting: Family Medicine

## 2021-12-14 VITALS — BP 129/76 | HR 72 | Ht 62.0 in | Wt 163.5 lb

## 2021-12-14 DIAGNOSIS — G4733 Obstructive sleep apnea (adult) (pediatric): Secondary | ICD-10-CM

## 2021-12-14 DIAGNOSIS — Z9989 Dependence on other enabling machines and devices: Secondary | ICD-10-CM | POA: Diagnosis not present

## 2021-12-14 NOTE — Progress Notes (Signed)
CM sent to [facility]

## 2021-12-31 DIAGNOSIS — M9902 Segmental and somatic dysfunction of thoracic region: Secondary | ICD-10-CM | POA: Diagnosis not present

## 2021-12-31 DIAGNOSIS — M791 Myalgia, unspecified site: Secondary | ICD-10-CM | POA: Diagnosis not present

## 2021-12-31 DIAGNOSIS — M9901 Segmental and somatic dysfunction of cervical region: Secondary | ICD-10-CM | POA: Diagnosis not present

## 2022-01-05 DIAGNOSIS — M5481 Occipital neuralgia: Secondary | ICD-10-CM | POA: Diagnosis not present

## 2022-01-05 DIAGNOSIS — M797 Fibromyalgia: Secondary | ICD-10-CM | POA: Diagnosis not present

## 2022-01-05 DIAGNOSIS — M5030 Other cervical disc degeneration, unspecified cervical region: Secondary | ICD-10-CM | POA: Diagnosis not present

## 2022-01-17 DIAGNOSIS — G4733 Obstructive sleep apnea (adult) (pediatric): Secondary | ICD-10-CM | POA: Diagnosis not present

## 2022-02-25 DIAGNOSIS — M9901 Segmental and somatic dysfunction of cervical region: Secondary | ICD-10-CM | POA: Diagnosis not present

## 2022-02-25 DIAGNOSIS — M9902 Segmental and somatic dysfunction of thoracic region: Secondary | ICD-10-CM | POA: Diagnosis not present

## 2022-02-25 DIAGNOSIS — M791 Myalgia, unspecified site: Secondary | ICD-10-CM | POA: Diagnosis not present

## 2022-03-04 DIAGNOSIS — M791 Myalgia, unspecified site: Secondary | ICD-10-CM | POA: Diagnosis not present

## 2022-03-04 DIAGNOSIS — M9901 Segmental and somatic dysfunction of cervical region: Secondary | ICD-10-CM | POA: Diagnosis not present

## 2022-03-04 DIAGNOSIS — M9902 Segmental and somatic dysfunction of thoracic region: Secondary | ICD-10-CM | POA: Diagnosis not present

## 2022-04-18 DIAGNOSIS — G4733 Obstructive sleep apnea (adult) (pediatric): Secondary | ICD-10-CM | POA: Diagnosis not present

## 2022-04-19 ENCOUNTER — Other Ambulatory Visit: Payer: Self-pay | Admitting: Family Medicine

## 2022-04-19 DIAGNOSIS — Z1231 Encounter for screening mammogram for malignant neoplasm of breast: Secondary | ICD-10-CM

## 2022-04-28 ENCOUNTER — Ambulatory Visit
Admission: RE | Admit: 2022-04-28 | Discharge: 2022-04-28 | Disposition: A | Discharge status: Home / Self Care | Payer: PPO | Source: Ambulatory Visit | Attending: Family Medicine | Admitting: Family Medicine

## 2022-04-28 DIAGNOSIS — Z1231 Encounter for screening mammogram for malignant neoplasm of breast: Secondary | ICD-10-CM | POA: Diagnosis not present

## 2022-04-29 ENCOUNTER — Other Ambulatory Visit: Payer: Self-pay | Admitting: Family Medicine

## 2022-04-29 DIAGNOSIS — R928 Other abnormal and inconclusive findings on diagnostic imaging of breast: Secondary | ICD-10-CM

## 2022-05-03 DIAGNOSIS — L723 Sebaceous cyst: Secondary | ICD-10-CM | POA: Diagnosis not present

## 2022-05-05 ENCOUNTER — Other Ambulatory Visit: Payer: Self-pay | Admitting: *Deleted

## 2022-05-05 ENCOUNTER — Telehealth: Payer: Self-pay | Admitting: Family Medicine

## 2022-05-05 DIAGNOSIS — R1013 Epigastric pain: Secondary | ICD-10-CM

## 2022-05-05 NOTE — Telephone Encounter (Signed)
Ok to place order. ? ?[name]. [name], MD ?05/05/2022 11:30 AM  ? ?

## 2022-05-05 NOTE — Telephone Encounter (Signed)
Order placed

## 2022-05-05 NOTE — Telephone Encounter (Signed)
Patient seeing cardiologist on Monday for cholesterol. Patient would like to come in today or tomorrow for cholesterol check- There are no labs on file- Patient last seen on 10/2021 for CP no other apts have been sch at the moment. Stated she checks her cholesterol every 6 months.  ? ?Please add cholesterol labs should patient need to come in otherwise bring her in for an apt?   ?

## 2022-05-05 NOTE — Telephone Encounter (Signed)
Please advise 

## 2022-05-06 ENCOUNTER — Other Ambulatory Visit (INDEPENDENT_AMBULATORY_CARE_PROVIDER_SITE_OTHER): Payer: PPO

## 2022-05-06 DIAGNOSIS — E782 Mixed hyperlipidemia: Secondary | ICD-10-CM | POA: Diagnosis not present

## 2022-05-06 LAB — LIPID PANEL
Cholesterol: 233 mg/dL — ABNORMAL HIGH (ref 0–200)
HDL: 61.5 mg/dL (ref >39.00)
LDL Cholesterol: 150 mg/dL — ABNORMAL HIGH (ref 0–99)
NonHDL: 171.02
Total CHOL/HDL Ratio: 4
Triglycerides: 106 mg/dL (ref 0.0–149.0)
VLDL: 21.2 mg/dL (ref 0.0–40.0)

## 2022-05-09 ENCOUNTER — Ambulatory Visit (INDEPENDENT_AMBULATORY_CARE_PROVIDER_SITE_OTHER): Payer: PPO | Admitting: Internal Medicine

## 2022-05-09 ENCOUNTER — Encounter: Payer: Self-pay | Admitting: Internal Medicine

## 2022-05-09 VITALS — BP 147/82 | HR 84 | Ht 62.0 in | Wt 165.2 lb

## 2022-05-09 DIAGNOSIS — T466X5A Adverse effect of antihyperlipidemic and antiarteriosclerotic drugs, initial encounter: Secondary | ICD-10-CM

## 2022-05-09 DIAGNOSIS — I700 Atherosclerosis of aorta: Secondary | ICD-10-CM | POA: Diagnosis not present

## 2022-05-09 DIAGNOSIS — M791 Myalgia, unspecified site: Secondary | ICD-10-CM | POA: Diagnosis not present

## 2022-05-09 DIAGNOSIS — E782 Mixed hyperlipidemia: Secondary | ICD-10-CM | POA: Diagnosis not present

## 2022-05-09 NOTE — Patient Instructions (Signed)
Medication Instructions:  ?START red yeast rice as directed ? ?*If you need a refill on your cardiac medications before your next appointment, please call your pharmacy* ? ? ?Lab Work: ?FASTING lab work to check cholesterol in 3-4 months -- NMR lipoprofile, LPa ? ?If you have labs (blood work) drawn today and your tests are completely normal, you will receive your results only by: ?MyChart Message (if you have MyChart) OR ?A paper copy in the mail ?If you have any lab test that is abnormal or we need to change your treatment, we will call you to review the results. ? ? ?Testing/Procedures: ?NONE ? ? ?Follow-Up: ?At [facility], you and your health needs are our priority.  As part of our continuing mission to provide you with exceptional heart care, we have created designated Provider Care Teams.  These Care Teams include your primary Cardiologist (physician) and Advanced Practice Providers (APPs -  Physician Assistants and Nurse Practitioners) who all work together to provide you with the care you need, when you need it. ? ?We recommend signing up for the patient portal called "MyChart".  Sign up information is provided on this After Visit Summary.  MyChart is used to connect with patients for Virtual Visits (Telemedicine).  Patients are able to view lab/test results, encounter notes, upcoming appointments, etc.  Non-urgent messages can be sent to your provider as well.   ?To learn more about what you can do with MyChart, go to [email or web address].   ? ?Your next appointment:   ?3-4 months with Dr. Debara Pickett -- lipid clinic ?

## 2022-05-09 NOTE — Progress Notes (Signed)
? ?LIPID CLINIC CONSULT NOTE ? ?Chief Complaint:  ?Manage dyslipidemia ? ?Primary Care Physician: ?[name], MD ? ?Primary Cardiologist:  ?[name], MD ? ?HPI:  ?Natalie Erickson is a 71 y.o. female who is being seen today for the evaluation of dyslipidemia at the request of [name], MD. This is a very pleasant 71 year old female kindly referred by her primary care provider for evaluation management of dyslipidemia.  She has been previously seen in our office by Dr. Adolm Joseph as well as more recently by Cherlynn Polo, PA-C.  She has a history of statin intolerance and was referred for evaluation management of her cholesterol.  She has been trying to manage this with diet and activity and more recently has had a reduction in her LDL cholesterol from 1 87 -> 150, total cholesterol 233, triglycerides 106 and HDL 61.  She has had prior studies including CT scan of the abdomen and pelvis which showed aortic atherosclerosis.  There is some heart disease remotely in her family.  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  She has a concern about statins as far as potential side effects.  She did seem amenable to trying red yeast rice today.  She also wanted to look further in her lipid profile with an NMR which I am certainly happy to obtain however her insurance is not likely to pay for that unless we wait several months.  Since she is interested in trying over-the-counter therapy, would make sense to try to get this information in a few months on treatment.  She reports her diet is fairly low in saturated fats in fact because of issues with IBS and fibromyalgia, she has severely limited her diet. ? ?PMHx:  ?Past Medical History:  ?Diagnosis Date  ? AC (acromioclavicular) joint bone spurs   ? c 5 and c6 and thoracic  ? Allergy   ? food allergies  ? Anxiety 04/06/2017  ? Benign thyroid cyst sept 2020 dx  ? Cataract   ? bilateral  ? Cystitis   ? Depression   ? Dry eyes 04/06/2017   ? Elevated BP without diagnosis of hypertension 04/06/2017  ? lost weight >> BP improved  ? Fibromyalgia   ? Floaters   ? both eyes  ? Heart murmur   ? hx of 2 Echocardiograms in [place] (pt was told they were normal)  ? Hiatal hernia   ? Hip pain   ? and groin pain for a while  ? History of colon polyps   ? History of echocardiogram   ? Echo 9/19: mild LVH, EF 60-65, no RWMA, Gr 1 DD, trivial MR/TR, PASP 16  ? History of kidney stones   ? History of Lyme disease   ? Hypercalcemia   ? causes kidney stones, none recent  ? IBS (irritable bowel syndrome)   ? Mixed hyperlipidemia 04/06/2017  ? intol to statins due to myalgias; never tried Zetia  ? Occipital neuralgia   ? Sleep apnea   ? mild to moderate , uses cpap  ? TMJ (dislocation of temporomandibular joint)   ? UTI (urinary tract infection) finishes antibiotic 03-27-2020  ? Vasomotor rhinitis   ? ? ?Past Surgical History:  ?Procedure Laterality Date  ? APPENDECTOMY  1959  ? COLONOSCOPY  2016  ? [place]  ? COLONOSCOPY  2011  ? in [place]   ? EVALUATION UNDER ANESTHESIA WITH HEMORRHOIDECTOMY N/A 04/02/2020  ? Procedure: ANORECTAL EXAM UNDER ANESTHESIA WITH  HEMORRHOIDECTOMY, HEMORRHOIDAL LIGATION;  Surgeon: [name], MD;  Location: [facility];  Service: General;  Laterality: N/A;  GENERAL AND LOCAL  ? left ovarin cyst removed  age 78's  ? grapefruit sized, exp laparotomy  ? POLYPECTOMY    ? UPPER GASTROINTESTINAL ENDOSCOPY    ? UPPER GI ENDOSCOPY    ? ? ?FAMHx:  ?Family History  ?Problem Relation Age of Onset  ? Healthy Mother   ? Hypertension Mother   ? Healthy Father   ? Lung cancer Father   ? Breast cancer Sister   ? Diabetes Paternal Grandmother   ? Colon cancer Neg Hx   ? Stomach cancer Neg Hx   ? Heart attack Neg Hx   ? Heart failure Neg Hx   ? Pancreatic cancer Neg Hx   ? Colon polyps Neg Hx   ? Esophageal cancer Neg Hx   ? Rectal cancer Neg Hx   ? ? ?SOCHx:  ? reports that she has never smoked. She has never used smokeless  tobacco. She reports that she does not drink alcohol and does not use drugs. ? ?ALLERGIES:  ?Allergies  ?Allergen Reactions  ? Diltiazem Shortness Of Breath  ? Metoprolol Shortness Of Breath  ? Statins Tinitus  ?  Achy joints, muscle aches ?Achy joints, muscle aches  ? Chocolate Other (See Comments)  ?  migraine  ? Cocoa Other (See Comments)  ?  migraine ?migraine  ? Codeine Nausea And Vomiting  ? Cranberry Other (See Comments)  ?  Cystitis  ? Latex Rash  ? Monosodium Glutamate Other (See Comments)  ?  Migraine  ? Penicillins Rash  ?  Has patient had a PCN reaction causing immediate rash, facial/tongue/throat swelling, SOB or lightheadedness with hypotension: Yes ?Has patient had a PCN reaction causing severe rash involving mucus membranes or skin necrosis: No ?Has patient had a PCN reaction that required hospitalization No ?Has patient had a PCN reaction occurring within the last 10 years: No ?If all of the above answers are "NO", then may proceed with Cephalosporin use. ?  ? Antihistamines, Loratadine-Type   ?  Throat swelling  ? Carvedilol Other (See Comments)  ?  N&N, SOB , Head ache and joint pain.   ? Ginger   ? Gluten Meal   ? Lexapro [Escitalopram]   ?  GI   ? Montelukast Other (See Comments)  ?  Throat swelling  ? Other Swelling  ?  Throat swelling  ? Prednisone Swelling  ?  Throat swelling (no difficulty breathing)  ? Sulfites Other (See Comments)  ?  Nausea and headaches  ? Vanilla   ? Whey   ? Zofran [Ondansetron Hcl] Other (See Comments)  ?  Muscle cramps, leg tingling  ? Azithromycin Other (See Comments) and Diarrhea  ? Eggs Or Egg-Derived Products Other (See Comments)  ?  States exacerbates her IBS symptoms  ? ? ?ROS: ?Pertinent items noted in HPI and remainder of comprehensive ROS otherwise negative. ? ?HOME MEDS: ?Current Outpatient Medications on File Prior to Visit  ?Medication Sig Dispense Refill  ? acetaminophen (TYLENOL) 500 MG tablet Take 1,000 mg by mouth every 6 (six) hours as needed for  headache (pain).    ? AMBULATORY NON FORMULARY MEDICATION Take 1 tablet by mouth as needed. Medication Name: [name]    ? Cholecalciferol (VITAMIN D) 2000 units tablet Take 2,000 Units by mouth daily.     ? gabapentin (NEURONTIN) 100 MG capsule Take 100 mg by mouth as  needed.    ? Magnesium Glycinate POWD 120 mg by Does not apply route as needed.     ? Polyethyl Glycol-Propyl Glycol (SYSTANE ULTRA OP) Apply to eye at bedtime.     ? polyethylene glycol powder (GLYCOLAX/MIRALAX) 17 GM/SCOOP powder Take 17-34 g by mouth as needed.    ? ?No current facility-administered medications on file prior to visit.  ? ? ?LABS/IMAGING: ?No results found for this or any previous visit (from the past 48 hour(s)). ?No results found. ? ?LIPID PANEL: ?   ?Component Value Date/Time  ? CHOL 233 (H) 05/06/2022 0834  ? TRIG 106.0 05/06/2022 0834  ? HDL 61.50 05/06/2022 0834  ? CHOLHDL 4 05/06/2022 0834  ? VLDL 21.2 05/06/2022 0834  ? [place] 150 (H) 05/06/2022 0834  ? LDLCALC 174 (H) 10/28/2020 8182  ? ? ?WEIGHTS: ?Wt Readings from Last 3 Encounters:  ?05/09/22 165 lb 3.2 oz (74.9 kg)  ?12/14/21 163 lb 8 oz (74.2 kg)  ?10/29/21 157 lb 9.6 oz (71.5 kg)  ? ? ?VITALS: ?BP (!) 147/82   Pulse 84   Ht '5\' 2"'$  (1.575 m)   Wt 165 lb 3.2 oz (74.9 kg)   SpO2 99%   BMI 30.22 kg/m?  ? ?EXAM: ?Deferred ? ?EKG: ?Deferred ? ?ASSESSMENT: ?Mixed dyslipidemia ?ASCVD with aortic atherosclerosis ?Statin intolerance-myalgias ? ?PLAN: ?1.   Ms. Deloach has a mixed dyslipidemia and evidence of aortic atherosclerosis.  She has been intolerant to statins due to having myalgias.  She also has a long list of other medication intolerances and underlying fibromyalgia which may make her more susceptible to intolerance.  Recently she has had further improvement in her cholesterol with dietary modifications.  I would submit that she needs to treatment to try to at least lower LDL below 100.  I offered a coronary calcium score to try to further restratify her and she  will consider that.  She would like to get lipoprotein testing which I think is reasonable.  She also suggested red yeast rice.  I think it is reasonable for her to start taking that with a goal to try to get betwe

## 2022-05-09 NOTE — Progress Notes (Signed)
Please inform patient of the following: ? ?Cholesterol levels are still elevated but better than where they were 6 months ago. We can recheck in 6-12 months. ? ?Natalie Erickson. [name], MD ?05/09/2022 10:40 AM  ?

## 2022-05-12 ENCOUNTER — Ambulatory Visit: Payer: PPO

## 2022-05-12 ENCOUNTER — Ambulatory Visit
Admission: RE | Admit: 2022-05-12 | Discharge: 2022-05-12 | Disposition: A | Discharge status: Home / Self Care | Payer: PPO | Source: Ambulatory Visit | Attending: Family Medicine | Admitting: Family Medicine

## 2022-05-12 DIAGNOSIS — R928 Other abnormal and inconclusive findings on diagnostic imaging of breast: Secondary | ICD-10-CM

## 2022-06-07 DIAGNOSIS — M9905 Segmental and somatic dysfunction of pelvic region: Secondary | ICD-10-CM | POA: Diagnosis not present

## 2022-06-07 DIAGNOSIS — M9903 Segmental and somatic dysfunction of lumbar region: Secondary | ICD-10-CM | POA: Diagnosis not present

## 2022-06-07 DIAGNOSIS — M791 Myalgia, unspecified site: Secondary | ICD-10-CM | POA: Diagnosis not present

## 2022-06-07 DIAGNOSIS — M9902 Segmental and somatic dysfunction of thoracic region: Secondary | ICD-10-CM | POA: Diagnosis not present

## 2022-06-07 DIAGNOSIS — M9901 Segmental and somatic dysfunction of cervical region: Secondary | ICD-10-CM | POA: Diagnosis not present

## 2022-06-08 ENCOUNTER — Telehealth: Payer: Self-pay | Admitting: Internal Medicine

## 2022-06-08 NOTE — Telephone Encounter (Signed)
Pt c/o medication issue:  1. Name of Medication: Red Yeast Rice Extract (RED YEAST RICE PO)  2. How are you currently taking this medication (dosage and times per day)? Take by mouth.  3. Are you having a reaction (difficulty breathing--STAT)? No  4. What is your medication issue? Pt states that she is not able to take. Pt would like a call back. Please advise

## 2022-06-08 NOTE — Telephone Encounter (Signed)
Patient stated she took red yeast for 2 weeks and had a bad experience with tinnitus. It also exacerbated her fibromyalgia. She is under chiropractic treatment that helps with the pain and has started back on gabapentin. The tinnitus is not as bad, but still present. She wants to try plant sterols later.

## 2022-06-21 DIAGNOSIS — M9902 Segmental and somatic dysfunction of thoracic region: Secondary | ICD-10-CM | POA: Diagnosis not present

## 2022-06-21 DIAGNOSIS — M9901 Segmental and somatic dysfunction of cervical region: Secondary | ICD-10-CM | POA: Diagnosis not present

## 2022-06-21 DIAGNOSIS — M9905 Segmental and somatic dysfunction of pelvic region: Secondary | ICD-10-CM | POA: Diagnosis not present

## 2022-06-21 DIAGNOSIS — M9903 Segmental and somatic dysfunction of lumbar region: Secondary | ICD-10-CM | POA: Diagnosis not present

## 2022-06-21 DIAGNOSIS — M791 Myalgia, unspecified site: Secondary | ICD-10-CM | POA: Diagnosis not present

## 2022-07-01 ENCOUNTER — Ambulatory Visit (INDEPENDENT_AMBULATORY_CARE_PROVIDER_SITE_OTHER): Payer: PPO | Admitting: Family

## 2022-07-01 ENCOUNTER — Encounter: Payer: Self-pay | Admitting: Family

## 2022-07-01 VITALS — BP 142/83 | HR 73 | Temp 98.4°F | Ht 62.0 in | Wt 165.0 lb

## 2022-07-01 DIAGNOSIS — W57XXXA Bitten or stung by nonvenomous insect and other nonvenomous arthropods, initial encounter: Secondary | ICD-10-CM | POA: Diagnosis not present

## 2022-07-01 DIAGNOSIS — S00462A Insect bite (nonvenomous) of left ear, initial encounter: Secondary | ICD-10-CM | POA: Diagnosis not present

## 2022-07-01 NOTE — Progress Notes (Signed)
Patient ID: Natalie Erickson, female    DOB: 1951/04/18, 71 y.o.   MRN: [id number]  Chief Complaint  Patient presents with   Insect Bite    Pt c/o a insect bite, Present for a couple of days. Pt c/o pain, red and itchy. Has tried washed with peroxide and bacitracin. Bite woke her up last night and was very painful.    HPI:   Insect bite:  top edge of left ear. Reports feeling something during the night and she pulled it off and it bled slightly. Since then she has felt burning pain at the site, but denies any paresthesias, fever, muscle or joint aches. Has applied OTC abt ointment.  Assessment & Plan:  1. Insect bite, unspecified site, initial encounter appears as a pinpoint reddened area, no rash noted. pt did not see what the insect was, just brushed it off.  Advised pt to clean with soap and water and ok to apply a thin layer of the abt ointment she has been using qd x 2 more days. Advised pt when to RTO w/any worsening sx.   Subjective:    Outpatient Medications Prior to Visit  Medication Sig Dispense Refill   acetaminophen (TYLENOL) 500 MG tablet Take 1,000 mg by mouth every 6 (six) hours as needed for headache (pain).     AMBULATORY NON FORMULARY MEDICATION Take 1 tablet by mouth as needed. Medication Name: [name]     b complex vitamins capsule Take 1 capsule by mouth daily.     Cholecalciferol (VITAMIN D) 2000 units tablet Take 2,000 Units by mouth daily.      gabapentin (NEURONTIN) 100 MG capsule Take 100 mg by mouth as needed.     Magnesium Glycinate POWD 120 mg by Does not apply route as needed.      omega-3 acid ethyl esters (LOVAZA) 1 g capsule Take by mouth 2 (two) times daily.     Polyethyl Glycol-Propyl Glycol (SYSTANE ULTRA OP) Apply to eye at bedtime.      polyethylene glycol powder (GLYCOLAX/MIRALAX) 17 GM/SCOOP powder Take 17-34 g by mouth as needed.     Red Yeast Rice Extract (RED YEAST RICE PO) Take by mouth.     No facility-administered medications prior  to visit.   Past Medical History:  Diagnosis Date   AC (acromioclavicular) joint bone spurs    c 5 and c6 and thoracic   Allergy    food allergies   Anxiety 04/06/2017   Benign thyroid cyst sept 2020 dx   Cataract    bilateral   Cystitis    Depression    Dry eyes 04/06/2017   Elevated BP without diagnosis of hypertension 04/06/2017   lost weight >> BP improved   Fibromyalgia    Floaters    both eyes   Heart murmur    hx of 2 Echocardiograms in [place] (pt was told they were normal)   Hiatal hernia    Hip pain    and groin pain for a while   History of colon polyps    History of echocardiogram    Echo 9/19: mild LVH, EF 60-65, no RWMA, Gr 1 DD, trivial MR/TR, PASP 16   History of kidney stones    History of Lyme disease    Hypercalcemia    causes kidney stones, none recent   IBS (irritable bowel syndrome)    Mixed hyperlipidemia 04/06/2017   intol to statins due to myalgias; never tried Zetia   Occipital neuralgia  Sleep apnea    mild to moderate , uses cpap   TMJ (dislocation of temporomandibular joint)    UTI (urinary tract infection) finishes antibiotic 03-27-2020   Vasomotor rhinitis    Past Surgical History:  Procedure Laterality Date   APPENDECTOMY  1959   COLONOSCOPY  2016   [place]   COLONOSCOPY  2011   in [place] N/A 04/02/2020   Procedure: ANORECTAL EXAM UNDER ANESTHESIA WITH HEMORRHOIDECTOMY, HEMORRHOIDAL LIGATION;  Surgeon: [name], MD;  Location: [facility];  Service: General;  Laterality: N/A;  GENERAL AND LOCAL   left ovarin cyst removed  age 60's   grapefruit sized, exp laparotomy   POLYPECTOMY     UPPER GASTROINTESTINAL ENDOSCOPY     UPPER GI ENDOSCOPY     Allergies  Allergen Reactions   Diltiazem Shortness Of Breath   Metoprolol Shortness Of Breath   Statins Tinitus    Achy joints, muscle aches Achy joints, muscle aches   Chocolate Other (See Comments)     migraine   Cocoa Other (See Comments)    migraine migraine   Codeine Nausea And Vomiting   Cranberry Other (See Comments)    Cystitis   Latex Rash   Monosodium Glutamate Other (See Comments)    Migraine   Penicillins Rash    Has patient had a PCN reaction causing immediate rash, facial/tongue/throat swelling, SOB or lightheadedness with hypotension: Yes Has patient had a PCN reaction causing severe rash involving mucus membranes or skin necrosis: No Has patient had a PCN reaction that required hospitalization No Has patient had a PCN reaction occurring within the last 10 years: No If all of the above answers are "NO", then may proceed with Cephalosporin use.    Antihistamines, Loratadine-Type     Throat swelling   Carvedilol Other (See Comments)    N&N, SOB , Head ache and joint pain.    Ginger    Gluten Meal    Lexapro [Escitalopram]     GI    Montelukast Other (See Comments)    Throat swelling   Other Swelling    Throat swelling   Prednisone Swelling    Throat swelling (no difficulty breathing)   Sulfites Other (See Comments)    Nausea and headaches   Vanilla    Whey    Zofran [Ondansetron Hcl] Other (See Comments)    Muscle cramps, leg tingling   Azithromycin Other (See Comments) and Diarrhea   Eggs Or Egg-Derived Products Other (See Comments)    States exacerbates her IBS symptoms      Objective:    Physical Exam Vitals and nursing note reviewed.  Constitutional:      Appearance: Normal appearance.  HENT:     Head:   Cardiovascular:     Rate and Rhythm: Normal rate and regular rhythm.  Pulmonary:     Effort: Pulmonary effort is normal.     Breath sounds: Normal breath sounds.  Musculoskeletal:        General: Normal range of motion.  Skin:    General: Skin is warm and dry.     Findings: Lesion (top of left outer ear pinna) present.  Neurological:     Mental Status: She is alert.  Psychiatric:        Mood and Affect: Mood normal.         Behavior: Behavior normal.    BP (!) 142/83 (BP Location: Left Arm, Patient Position: Sitting, Cuff Size:  Large)   Pulse 73   Temp 98.4 F (36.9 C) (Temporal)   Ht '5\' 2"'$  (1.575 m)   Wt 165 lb (74.8 kg)   SpO2 97%   BMI 30.18 kg/m  Wt Readings from Last 3 Encounters:  07/01/22 165 lb (74.8 kg)  05/09/22 165 lb 3.2 oz (74.9 kg)  12/14/21 163 lb 8 oz (74.2 kg)       Jeanie Sewer, NP

## 2022-07-05 DIAGNOSIS — M791 Myalgia, unspecified site: Secondary | ICD-10-CM | POA: Diagnosis not present

## 2022-07-05 DIAGNOSIS — M9901 Segmental and somatic dysfunction of cervical region: Secondary | ICD-10-CM | POA: Diagnosis not present

## 2022-07-05 DIAGNOSIS — M9903 Segmental and somatic dysfunction of lumbar region: Secondary | ICD-10-CM | POA: Diagnosis not present

## 2022-07-05 DIAGNOSIS — M9905 Segmental and somatic dysfunction of pelvic region: Secondary | ICD-10-CM | POA: Diagnosis not present

## 2022-07-05 DIAGNOSIS — M9902 Segmental and somatic dysfunction of thoracic region: Secondary | ICD-10-CM | POA: Diagnosis not present

## 2022-08-02 DIAGNOSIS — M9903 Segmental and somatic dysfunction of lumbar region: Secondary | ICD-10-CM | POA: Diagnosis not present

## 2022-08-02 DIAGNOSIS — M9901 Segmental and somatic dysfunction of cervical region: Secondary | ICD-10-CM | POA: Diagnosis not present

## 2022-08-02 DIAGNOSIS — M791 Myalgia, unspecified site: Secondary | ICD-10-CM | POA: Diagnosis not present

## 2022-08-02 DIAGNOSIS — G4733 Obstructive sleep apnea (adult) (pediatric): Secondary | ICD-10-CM | POA: Diagnosis not present

## 2022-08-02 DIAGNOSIS — M9905 Segmental and somatic dysfunction of pelvic region: Secondary | ICD-10-CM | POA: Diagnosis not present

## 2022-08-02 DIAGNOSIS — M9902 Segmental and somatic dysfunction of thoracic region: Secondary | ICD-10-CM | POA: Diagnosis not present

## 2022-08-08 ENCOUNTER — Encounter: Payer: Self-pay | Admitting: Internal Medicine

## 2022-08-08 ENCOUNTER — Ambulatory Visit: Payer: PPO | Admitting: Internal Medicine

## 2022-08-08 VITALS — BP 136/80 | HR 81 | Ht 62.0 in | Wt 168.2 lb

## 2022-08-08 DIAGNOSIS — E042 Nontoxic multinodular goiter: Secondary | ICD-10-CM | POA: Diagnosis not present

## 2022-08-08 NOTE — Progress Notes (Unsigned)
Name: Natalie Erickson  MRN/ DOB: [id number], [birth year]    Age/ Sex: 71 y.o., female     PCP: [name], MD   Reason for Endocrinology Evaluation: MNG     Initial Endocrinology Clinic Visit: 09/10/2019    PATIENT IDENTIFIER: Natalie Erickson is a 71 y.o., female with a past medical history of GAD, fibromyalgia , OSA on CPAP and dyslipidemia. She has followed with [facility] since 09/09/2020 for consultative assistance with management of her MNG.   HISTORICAL SUMMARY:   Pt was found to have an incidental thyroid nodules on MRI 06/2019 during evaluation of a left neck pain with radiculopathy.      An ultrasound 07/2019 confirmed diagnosis of multiple thyroid nodules, with the largest being 3.3 cm at the left middle thyroid lobe . She is S/P benign FNA of the left mid thyroid nodule on 09/17/2019     No FH of thyroid disease   SUBJECTIVE:    Today (08/08/2022):  Natalie Erickson is here for a follow up on MNG.   Weight fluctuates  Denies constipation  - uses miralax and magnesium is needed  Denies palpitations except rarely  Denies tremors  Denies local neck symptoms  No Biotin   Has fibromyalgia      HISTORY:  Past Medical History:  Past Medical History:  Diagnosis Date   AC (acromioclavicular) joint bone spurs    c 5 and c6 and thoracic   Allergy    food allergies   Anxiety 04/06/2017   Benign thyroid cyst sept 2020 dx   Cataract    bilateral   Cystitis    Depression    Dry eyes 04/06/2017   Elevated BP without diagnosis of hypertension 04/06/2017   lost weight >> BP improved   Fibromyalgia    Floaters    both eyes   Heart murmur    hx of 2 Echocardiograms in [place] (pt was told they were normal)   Hiatal hernia    Hip pain    and groin pain for a while   History of colon polyps    History of echocardiogram    Echo 9/19: mild LVH, EF 60-65, no RWMA, Gr 1 DD, trivial MR/TR, PASP 16   History of kidney stones    History of Lyme  disease    Hypercalcemia    causes kidney stones, none recent   IBS (irritable bowel syndrome)    Mixed hyperlipidemia 04/06/2017   intol to statins due to myalgias; never tried Zetia   Occipital neuralgia    Sleep apnea    mild to moderate , uses cpap   TMJ (dislocation of temporomandibular joint)    UTI (urinary tract infection) finishes antibiotic 03-27-2020   Vasomotor rhinitis    Past Surgical History:  Past Surgical History:  Procedure Laterality Date   APPENDECTOMY  1959   COLONOSCOPY  2016   [place]   COLONOSCOPY  2011   in [place] N/A 04/02/2020   Procedure: ANORECTAL EXAM UNDER ANESTHESIA WITH HEMORRHOIDECTOMY, HEMORRHOIDAL LIGATION;  Surgeon: [name], MD;  Location: [facility];  Service: General;  Laterality: N/A;  GENERAL AND LOCAL   left ovarin cyst removed  age 41's   grapefruit sized, exp laparotomy   POLYPECTOMY     UPPER GASTROINTESTINAL ENDOSCOPY     UPPER GI ENDOSCOPY     Social History:  reports that she has never smoked. She has never  used smokeless tobacco. She reports that she does not drink alcohol and does not use drugs. Family History:  Family History  Problem Relation Age of Onset   Healthy Mother    Hypertension Mother    Healthy Father    Lung cancer Father    Breast cancer Sister    Diabetes Paternal Grandmother    Colon cancer Neg Hx    Stomach cancer Neg Hx    Heart attack Neg Hx    Heart failure Neg Hx    Pancreatic cancer Neg Hx    Colon polyps Neg Hx    Esophageal cancer Neg Hx    Rectal cancer Neg Hx      HOME MEDICATIONS: Allergies as of 08/08/2022       Reactions   Diltiazem Shortness Of Breath   Metoprolol Shortness Of Breath   Statins Tinitus   Achy joints, muscle aches Achy joints, muscle aches   Chocolate Other (See Comments)   migraine   Cocoa Other (See Comments)   migraine migraine   Codeine Nausea And Vomiting   Cranberry Other  (See Comments)   Cystitis   Latex Rash   Monosodium Glutamate Other (See Comments)   Migraine   Penicillins Rash   Has patient had a PCN reaction causing immediate rash, facial/tongue/throat swelling, SOB or lightheadedness with hypotension: Yes Has patient had a PCN reaction causing severe rash involving mucus membranes or skin necrosis: No Has patient had a PCN reaction that required hospitalization No Has patient had a PCN reaction occurring within the last 10 years: No If all of the above answers are "NO", then may proceed with Cephalosporin use.   Antihistamines, Loratadine-type    Throat swelling   Carvedilol Other (See Comments)   N&N, SOB , Head ache and joint pain.    Ginger    Gluten Meal    Lexapro [escitalopram]    GI    Montelukast Other (See Comments)   Throat swelling   Other Swelling   Throat swelling   Prednisone Swelling   Throat swelling (no difficulty breathing)   Sulfites Other (See Comments)   Nausea and headaches   Vanilla    Whey    Zofran [ondansetron Hcl] Other (See Comments)   Muscle cramps, leg tingling   Azithromycin Other (See Comments), Diarrhea   Eggs Or Egg-derived Products Other (See Comments)   States exacerbates her IBS symptoms        Medication List        Accurate as of August 08, 2022  3:31 PM. If you have any questions, ask your nurse or doctor.          STOP taking these medications    RED YEAST RICE PO Stopped by: Dorita Sciara, MD       TAKE these medications    acetaminophen 500 MG tablet Commonly known as: TYLENOL Take 1,000 mg by mouth every 6 (six) hours as needed for headache (pain).   AMBULATORY NON FORMULARY MEDICATION Take 1 tablet by mouth as needed. Medication Name: [name]   b complex vitamins capsule Take 1 capsule by mouth daily.   gabapentin 100 MG capsule Commonly known as: NEURONTIN Take 100 mg by mouth as needed.   Magnesium Glycinate Powd 120 mg by Does not apply route as  needed.   omega-3 acid ethyl esters 1 g capsule Commonly known as: LOVAZA Take by mouth 2 (two) times daily.   polyethylene glycol powder 17 GM/SCOOP powder Commonly known as:  GLYCOLAX/MIRALAX Take 17-34 g by mouth as needed.   SYSTANE ULTRA OP Apply to eye at bedtime.   Vitamin D 50 MCG (2000 UT) tablet Take 2,000 Units by mouth daily.         OBJECTIVE:   PHYSICAL EXAM: VS: BP 136/80 (BP Location: Left Arm, Patient Position: Sitting, Cuff Size: Large)   Pulse 81   Ht '5\' 2"'$  (1.575 m)   Wt 168 lb 3.2 oz (76.3 kg)   SpO2 99%   BMI 30.76 kg/m    EXAM: General: Pt appears well and is in NAD  Neck: General: Supple without adenopathy. Thyroid: Thyroid size normal.  Fullness palpated on the left   Lungs: Clear with good BS bilat with no rales, rhonchi, or wheezes  Heart: Auscultation: RRR.  Abdomen: Normoactive bowel sounds, soft, nontender, without masses or organomegaly palpable  Extremities:  BL LE: No pretibial edema normal ROM and strength.  Mental Status: Judgment, insight: Intact Orientation: Oriented to time, place, and person Mood and affect: No depression, anxiety, or agitation     DATA REVIEWED: ***  Thyroid Ultrasound 10/08/2021   Parenchymal Echotexture: Mildly heterogenous   Isthmus: 0.3 cm   Right lobe: 5.0 x 1.7 x 1.7 cm   Left lobe: 5.6 x 2.1 x 2.7 cm   _________________________________________________________   Estimated total number of nodules >/= 1 cm: 2   Number of spongiform nodules >/=  2 cm not described below (TR1): 0   Number of mixed cystic and solid nodules >/= 1.5 cm not described below (TR2): 0   _________________________________________________________   Nodule # 1: Small subcentimeter hypoechoic nodule in the thyroid isthmus demonstrates no significant interval change. As before, this nodule does not meet criteria for further follow-up.   Nodule # 2: Stable appearance of previously biopsied nodule in the left mid  gland. The lesion measures 3.3 x 2.2 x 2.1 cm today and remains hypoechoic and solid with internal echogenic foci.   Nodule # 3: No significant interval change in the size or appearance of a small mixed cystic and solid nodule in the left inferior gland. As before, this lesion does not meet criteria for further evaluation.   No new nodules or suspicious features identified.   IMPRESSION: 1. Continued stability of previously biopsied nodule in the left mid gland. Presuming prior benign biopsy results, no further imaging is recommended. 2. No new nodules or suspicious features identified.    FNA 09/17/2019 Clinical History: Left mid 3.3cm; Other 2 dimensions: 2.3 x 1.7cm, Solid  DIAGNOSIS:  - Consistent with benign follicular nodule (Bethesda category II)   ASSESSMENT / PLAN / RECOMMENDATIONS:   Multinodular Goiter:  - Pt is clinically euthyroid - No local neck symptoms  - S/P benign FNA of the left mid nodule (08/2019) -Serial ultrasounds have been stable, will repeat again this year    F/U in 1 yr    Signed electronically by: Mack Guise, MD  [facility]  [facility] [address] [place], [place] [address] Phone: [phone] FAX: [phone]      CC: [name], MD [address] [place] [address] Phone: [phone]  Fax: [phone]   Return to Endocrinology clinic as below: Future Appointments  Date Time Provider [place]  09/09/2022  2:30 PM Pixie Casino, MD [facility]  12/14/2022 10:30 AM Ubaldo Glassing, Amy, NP [facility] None

## 2022-08-09 LAB — T3: T3, Total: 124 ng/dL (ref 76–181)

## 2022-08-09 LAB — T4, FREE: Free T4: 0.74 ng/dL (ref 0.60–1.60)

## 2022-08-09 LAB — TSH: TSH: 0.8 u[IU]/mL (ref 0.35–5.50)

## 2022-09-02 DIAGNOSIS — E782 Mixed hyperlipidemia: Secondary | ICD-10-CM | POA: Diagnosis not present

## 2022-09-03 LAB — NMR, LIPOPROFILE
Cholesterol, Total: 231 mg/dL — ABNORMAL HIGH (ref 100–199)
HDL Particle Number: 31.7 umol/L (ref >=30.5)
HDL-C: 64 mg/dL (ref >39)
LDL Particle Number: 1776 nmol/L — ABNORMAL HIGH (ref <1000)
LDL Size: 21.7 nm (ref >20.5)
LDL-C (NIH Calc): 154 mg/dL — ABNORMAL HIGH (ref 0–99)
LP-IR Score: <25 (ref <=45)
Small LDL Particle Number: 448 nmol/L (ref <=527)
Triglycerides: 75 mg/dL (ref 0–149)

## 2022-09-03 LAB — LIPOPROTEIN A (LPA): Lipoprotein (a): 281.4 nmol/L — ABNORMAL HIGH (ref <75.0)

## 2022-09-06 DIAGNOSIS — M9903 Segmental and somatic dysfunction of lumbar region: Secondary | ICD-10-CM | POA: Diagnosis not present

## 2022-09-06 DIAGNOSIS — M9902 Segmental and somatic dysfunction of thoracic region: Secondary | ICD-10-CM | POA: Diagnosis not present

## 2022-09-06 DIAGNOSIS — M9905 Segmental and somatic dysfunction of pelvic region: Secondary | ICD-10-CM | POA: Diagnosis not present

## 2022-09-06 DIAGNOSIS — M9901 Segmental and somatic dysfunction of cervical region: Secondary | ICD-10-CM | POA: Diagnosis not present

## 2022-09-09 ENCOUNTER — Ambulatory Visit: Payer: PPO | Attending: Internal Medicine | Admitting: Internal Medicine

## 2022-09-09 ENCOUNTER — Encounter: Payer: Self-pay | Admitting: Internal Medicine

## 2022-09-09 VITALS — BP 126/78 | HR 95 | Ht 62.0 in | Wt 164.6 lb

## 2022-09-09 DIAGNOSIS — E7841 Elevated Lipoprotein(a): Secondary | ICD-10-CM | POA: Diagnosis not present

## 2022-09-09 DIAGNOSIS — E782 Mixed hyperlipidemia: Secondary | ICD-10-CM | POA: Diagnosis not present

## 2022-09-09 DIAGNOSIS — M791 Myalgia, unspecified site: Secondary | ICD-10-CM

## 2022-09-09 DIAGNOSIS — T466X5D Adverse effect of antihyperlipidemic and antiarteriosclerotic drugs, subsequent encounter: Secondary | ICD-10-CM | POA: Diagnosis not present

## 2022-09-09 DIAGNOSIS — I700 Atherosclerosis of aorta: Secondary | ICD-10-CM

## 2022-09-09 DIAGNOSIS — T466X5A Adverse effect of antihyperlipidemic and antiarteriosclerotic drugs, initial encounter: Secondary | ICD-10-CM

## 2022-09-09 MED ORDER — REPATHA SURECLICK 140 MG/ML ~~LOC~~ SOAJ: 1.0000 | SUBCUTANEOUS | 11 refills | Status: DC

## 2022-09-09 NOTE — Progress Notes (Signed)
LIPID CLINIC CONSULT NOTE  Chief Complaint:  Follow-up dyslipidemia  Primary Care Physician: [name], MD  Primary Cardiologist:  [name], MD  HPI:  Natalie Erickson is a 71 y.o. female who is being seen today for the evaluation of dyslipidemia at the request of [name], MD. This is a very pleasant 71 year old female kindly referred by her primary care provider for evaluation management of dyslipidemia.  She has been previously seen in our office by Dr. Adolm Joseph as well as more recently by Cherlynn Polo, PA-C.  She has a history of statin intolerance and was referred for evaluation management of her cholesterol.  She has been trying to manage this with diet and activity and more recently has had a reduction in her LDL cholesterol from 1 87 -> 150, total cholesterol 233, triglycerides 106 and HDL 61.  She has had prior studies including CT scan of the abdomen and pelvis which showed aortic atherosclerosis.  There is some heart disease remotely in her family.  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  She has a concern about statins as far as potential side effects.  She did seem amenable to trying red yeast rice today.  She also wanted to look further in her lipid profile with an NMR which I am certainly happy to obtain however her insurance is not likely to pay for that unless we wait several months.  Since she is interested in trying over-the-counter therapy, would make sense to try to get this information in a few months on treatment.  She reports her diet is fairly low in saturated fats in fact because of issues with IBS and fibromyalgia, she has severely limited her diet.  09/09/2022  Natalie Erickson returns today for follow-up.  She underwent a lipid NMR which shows elevated cholesterol.  Her LDL particle number is 1776, LDL-C of 154, HDL-C64 and triglycerides 75.  Her LP(a) also was significantly elevated at 281.4 nmol/L.  She reports that her sister  also has a very high LP(a) and is on Repatha.  She could also not tolerate statins.  Natalie Erickson has not been able to tolerate statins and recently tried red yeast rice which also caused her significant myalgias.  As noted above she has ASCVD with imaging evidence of aortic atherosclerosis.  PMHx:  Past Medical History:  Diagnosis Date   AC (acromioclavicular) joint bone spurs    c 5 and c6 and thoracic   Allergy    food allergies   Anxiety 04/06/2017   Benign thyroid cyst sept 2020 dx   Cataract    bilateral   Cystitis    Depression    Dry eyes 04/06/2017   Elevated BP without diagnosis of hypertension 04/06/2017   lost weight >> BP improved   Fibromyalgia    Floaters    both eyes   Heart murmur    hx of 2 Echocardiograms in [place] (pt was told they were normal)   Hiatal hernia    Hip pain    and groin pain for a while   History of colon polyps    History of echocardiogram    Echo 9/19: mild LVH, EF 60-65, no RWMA, Gr 1 DD, trivial MR/TR, PASP 16   History of kidney stones    History of Lyme disease    Hypercalcemia    causes kidney stones, none recent   IBS (irritable bowel syndrome)    Mixed hyperlipidemia 04/06/2017  intol to statins due to myalgias; never tried Zetia   Occipital neuralgia    Sleep apnea    mild to moderate , uses cpap   TMJ (dislocation of temporomandibular joint)    UTI (urinary tract infection) finishes antibiotic 03-27-2020   Vasomotor rhinitis     Past Surgical History:  Procedure Laterality Date   APPENDECTOMY  1959   COLONOSCOPY  2016   [place]   COLONOSCOPY  2011   in [place] N/A 04/02/2020   Procedure: ANORECTAL EXAM UNDER ANESTHESIA WITH HEMORRHOIDECTOMY, HEMORRHOIDAL LIGATION;  Surgeon: [name], MD;  Location: [employer];  Service: General;  Laterality: N/A;  GENERAL AND LOCAL   left ovarin cyst removed  age 89's   grapefruit sized, exp laparotomy    POLYPECTOMY     UPPER GASTROINTESTINAL ENDOSCOPY     UPPER GI ENDOSCOPY      FAMHx:  Family History  Problem Relation Age of Onset   Healthy Mother    Hypertension Mother    Healthy Father    Lung cancer Father    Breast cancer Sister    Diabetes Paternal Grandmother    Colon cancer Neg Hx    Stomach cancer Neg Hx    Heart attack Neg Hx    Heart failure Neg Hx    Pancreatic cancer Neg Hx    Colon polyps Neg Hx    Esophageal cancer Neg Hx    Rectal cancer Neg Hx     SOCHx:   reports that she has never smoked. She has never used smokeless tobacco. She reports that she does not drink alcohol and does not use drugs.  ALLERGIES:  Allergies  Allergen Reactions   Diltiazem Shortness Of Breath   Metoprolol Shortness Of Breath   Statins Tinitus    Achy joints, muscle aches Achy joints, muscle aches   Chocolate Other (See Comments)    migraine   Cocoa Other (See Comments)    migraine migraine   Codeine Nausea And Vomiting   Cranberry Other (See Comments)    Cystitis   Latex Rash   Monosodium Glutamate Other (See Comments)    Migraine   Penicillins Rash    Has patient had a PCN reaction causing immediate rash, facial/tongue/throat swelling, SOB or lightheadedness with hypotension: Yes Has patient had a PCN reaction causing severe rash involving mucus membranes or skin necrosis: No Has patient had a PCN reaction that required hospitalization No Has patient had a PCN reaction occurring within the last 10 years: No If all of the above answers are "NO", then may proceed with Cephalosporin use.    Antihistamines, Loratadine-Type     Throat swelling   Carvedilol Other (See Comments)    N&N, SOB , Head ache and joint pain.    Ginger    Gluten Meal    Lexapro [Escitalopram]     GI    Montelukast Other (See Comments)    Throat swelling   Other Swelling    Throat swelling   Prednisone Swelling    Throat swelling (no difficulty breathing)   Sulfites Other (See Comments)     Nausea and headaches   Vanilla    Whey    Zofran [Ondansetron Hcl] Other (See Comments)    Muscle cramps, leg tingling   Azithromycin Other (See Comments) and Diarrhea   Eggs Or Egg-Derived Products Other (See Comments)    States exacerbates her IBS symptoms    ROS: Pertinent  items noted in HPI and remainder of comprehensive ROS otherwise negative.  HOME MEDS: Current Outpatient Medications on File Prior to Visit  Medication Sig Dispense Refill   acetaminophen (TYLENOL) 500 MG tablet Take 1,000 mg by mouth every 6 (six) hours as needed for headache (pain).     AMBULATORY NON FORMULARY MEDICATION Take 1 tablet by mouth as needed. Medication Name: [name]     b complex vitamins capsule Take 1 capsule by mouth daily.     Cholecalciferol (VITAMIN D) 2000 units tablet Take 2,000 Units by mouth daily.      Cyanocobalamin (VITAMIN B 12 PO) Take by mouth.     gabapentin (NEURONTIN) 100 MG capsule Take 100 mg by mouth as needed.     Magnesium Glycinate POWD 120 mg by Does not apply route as needed.      omega-3 acid ethyl esters (LOVAZA) 1 g capsule Take by mouth 2 (two) times daily.     Polyethyl Glycol-Propyl Glycol (SYSTANE ULTRA OP) Apply to eye at bedtime.      polyethylene glycol powder (GLYCOLAX/MIRALAX) 17 GM/SCOOP powder Take 17-34 g by mouth as needed.     No current facility-administered medications on file prior to visit.    LABS/IMAGING: No results found for this or any previous visit (from the past 48 hour(s)). No results found.  LIPID PANEL:    Component Value Date/Time   CHOL 233 (H) 05/06/2022 0834   TRIG 106.0 05/06/2022 0834   HDL 61.50 05/06/2022 0834   CHOLHDL 4 05/06/2022 0834   VLDL 21.2 05/06/2022 0834   LDLCALC 150 (H) 05/06/2022 0834   LDLCALC 174 (H) 10/28/2020 0927    WEIGHTS: Wt Readings from Last 3 Encounters:  09/09/22 164 lb 9.6 oz (74.7 kg)  08/08/22 168 lb 3.2 oz (76.3 kg)  07/01/22 165 lb (74.8 kg)    VITALS: BP 126/78   Pulse  95   Ht '5\' 2"'$  (1.575 m)   Wt 164 lb 9.6 oz (74.7 kg)   SpO2 97%   BMI 30.11 kg/m   EXAM: Deferred  EKG: Deferred  ASSESSMENT: Mixed dyslipidemia ASCVD with aortic atherosclerosis Statin intolerance-myalgias  PLAN: 1.   Ms. Wales has a mixed dyslipidemia but more importantly has a significantly elevated LP(a) at 281.4 nmol/L.  She has ASCVD with aortic atherosclerosis and cannot tolerate statins and more recently could not tolerate red yeast rice.  I think her options are limited and she would likely benefit from a PCSK9 inhibitor.  We discussed Repatha 140 mg every 2 weeks.  We will reach out for prior authorization for this.  Plan follow-up with me in about 3 to 4 months with repeat lipids.  Pixie Casino, MD, [facility], [place] Director of the Advanced Lipid Disorders &  Cardiovascular Risk Reduction Clinic Diplomate of the American Board of Clinical Lipidology Attending Cardiologist  Direct Dial: [phone]  Fax: [phone]  Website:  [email or web address] [email or web address] 09/09/2022, 2:32 PM

## 2022-09-09 NOTE — Patient Instructions (Signed)
Medication Instructions:  Dr. Debara Pickett recommends Repatha '140mg'$ /mL or Praluent '150mg'$ /mL (PCSK9). This is an injectable cholesterol medication self-administered once every 14 days. This medication will likely need prior approval with your insurance company, which we will work on. If the medication is not approved initially, we may need to do an appeal with your insurance.   Administer medication in area of fatty tissue such as abdomen, outer thigh, back of upper arm - and rotate site with each injection Store medication in refrigerator until ready to administer - allow to sit at room temp for 30 mins - 1 hour prior to injection Dispose of medication in a SHARPS container - your pharmacy should be able to direct you on this and proper disposal   If you need a co-pay card for Repatha: MicroLists.at If you need a co-pay card for Praluent: https://praluentpatientsupport.KnowRentals.uy  Patient Assistance:    These foundations have funds at various times.   The PAN Foundation: https://www.panfoundation.org/disease-funds/hypercholesterolemia/ -- can sign up for wait list  [facility] offers assistance to help pay for medication copays.  They will cover copays for all cholesterol lowering meds, including statins, fibrates, omega-3 fish oils like Vascepa, ezetimibe, Repatha, Praluent, Nexletol, Nexlizet.  The cards are usually good for $2,500 or 12 months, whichever comes first. Go to healthwellfoundation.org Click on "Apply Now" Answer questions as to whom is applying (patient or representative) Your disease fund will be "hypercholesterolemia - Medicare access" They will ask questions about finances and which medications you are taking for cholesterol When you submit, the approval is usually within minutes.  You will need to print the card information from the site You will need to show this information to your pharmacy, they will bill your Medicare Part D plan first  -then bill Health Well --for the copay.   You can also call them at [phone], although the hold times can be quite long.     *If you need a refill on your cardiac medications before your next appointment, please call your pharmacy*   Lab Work: FASTING NMR lipoprofile and LPa in about 4 months  If you have labs (blood work) drawn today and your tests are completely normal, you will receive your results only by: [place] (if you have MyChart) OR A paper copy in the mail If you have any lab test that is abnormal or we need to change your treatment, we will call you to review the results.   Follow-Up: At [facility], you and your health needs are our priority.  As part of our continuing mission to provide you with exceptional heart care, we have created designated Provider Care Teams.  These Care Teams include your primary Cardiologist (physician) and Advanced Practice Providers (APPs -  Physician Assistants and Nurse Practitioners) who all work together to provide you with the care you need, when you need it.  We recommend signing up for the patient portal called "MyChart".  Sign up information is provided on this After Visit Summary.  MyChart is used to connect with patients for Virtual Visits (Telemedicine).  Patients are able to view lab/test results, encounter notes, upcoming appointments, etc.  Non-urgent messages can be sent to your provider as well.   To learn more about what you can do with MyChart, go to [email or web address].    Your next appointment:   4 month(s)  The format for your next appointment:   In Person  Provider:   Lyman Bishop MD - lipid clinic

## 2022-09-12 ENCOUNTER — Telehealth (HOSPITAL_BASED_OUTPATIENT_CLINIC_OR_DEPARTMENT_OTHER): Payer: Self-pay

## 2022-09-12 ENCOUNTER — Encounter (HOSPITAL_BASED_OUTPATIENT_CLINIC_OR_DEPARTMENT_OTHER): Payer: Self-pay

## 2022-09-12 NOTE — Telephone Encounter (Signed)
PA submitted by [facility], [place]isa Starr, Rx #: [id number].

## 2022-09-12 NOTE — Telephone Encounter (Signed)
PA approved for Repatha SureClick '140MG'$ /ML auto-injectors from 18-SEP-23 to 18-MAR-24.

## 2022-09-14 DIAGNOSIS — M9903 Segmental and somatic dysfunction of lumbar region: Secondary | ICD-10-CM | POA: Diagnosis not present

## 2022-09-14 DIAGNOSIS — M791 Myalgia, unspecified site: Secondary | ICD-10-CM | POA: Diagnosis not present

## 2022-09-14 DIAGNOSIS — M9902 Segmental and somatic dysfunction of thoracic region: Secondary | ICD-10-CM | POA: Diagnosis not present

## 2022-09-14 DIAGNOSIS — M9905 Segmental and somatic dysfunction of pelvic region: Secondary | ICD-10-CM | POA: Diagnosis not present

## 2022-09-14 DIAGNOSIS — M9901 Segmental and somatic dysfunction of cervical region: Secondary | ICD-10-CM | POA: Diagnosis not present

## 2022-09-16 ENCOUNTER — Ambulatory Visit: Payer: PPO | Admitting: Internal Medicine

## 2022-09-16 DIAGNOSIS — H04123 Dry eye syndrome of bilateral lacrimal glands: Secondary | ICD-10-CM | POA: Diagnosis not present

## 2022-09-16 DIAGNOSIS — H25813 Combined forms of age-related cataract, bilateral: Secondary | ICD-10-CM | POA: Diagnosis not present

## 2022-09-16 DIAGNOSIS — H524 Presbyopia: Secondary | ICD-10-CM | POA: Diagnosis not present

## 2022-09-16 DIAGNOSIS — H43813 Vitreous degeneration, bilateral: Secondary | ICD-10-CM | POA: Diagnosis not present

## 2022-09-16 DIAGNOSIS — H353131 Nonexudative age-related macular degeneration, bilateral, early dry stage: Secondary | ICD-10-CM | POA: Diagnosis not present

## 2022-09-19 ENCOUNTER — Encounter: Payer: Self-pay | Admitting: *Deleted

## 2022-09-20 DIAGNOSIS — M9901 Segmental and somatic dysfunction of cervical region: Secondary | ICD-10-CM | POA: Diagnosis not present

## 2022-09-20 DIAGNOSIS — M791 Myalgia, unspecified site: Secondary | ICD-10-CM | POA: Diagnosis not present

## 2022-09-20 DIAGNOSIS — M9905 Segmental and somatic dysfunction of pelvic region: Secondary | ICD-10-CM | POA: Diagnosis not present

## 2022-09-20 DIAGNOSIS — M9902 Segmental and somatic dysfunction of thoracic region: Secondary | ICD-10-CM | POA: Diagnosis not present

## 2022-09-20 DIAGNOSIS — M9903 Segmental and somatic dysfunction of lumbar region: Secondary | ICD-10-CM | POA: Diagnosis not present

## 2022-10-31 DIAGNOSIS — G4733 Obstructive sleep apnea (adult) (pediatric): Secondary | ICD-10-CM | POA: Diagnosis not present

## 2022-11-01 DIAGNOSIS — M791 Myalgia, unspecified site: Secondary | ICD-10-CM | POA: Diagnosis not present

## 2022-11-01 DIAGNOSIS — M9905 Segmental and somatic dysfunction of pelvic region: Secondary | ICD-10-CM | POA: Diagnosis not present

## 2022-11-01 DIAGNOSIS — M9901 Segmental and somatic dysfunction of cervical region: Secondary | ICD-10-CM | POA: Diagnosis not present

## 2022-11-01 DIAGNOSIS — M9903 Segmental and somatic dysfunction of lumbar region: Secondary | ICD-10-CM | POA: Diagnosis not present

## 2022-11-01 DIAGNOSIS — M9902 Segmental and somatic dysfunction of thoracic region: Secondary | ICD-10-CM | POA: Diagnosis not present

## 2022-11-02 ENCOUNTER — Telehealth: Payer: Self-pay | Admitting: Internal Medicine

## 2022-11-02 NOTE — Telephone Encounter (Signed)
Pt c/o medication issue:  1. Name of Medication: Evolocumab (REPATHA SURECLICK) 557 MG/ML SOAJ   2. How are you currently taking this medication (dosage and times per day)? As prescribed  3. Are you having a reaction (difficulty breathing--STAT)? Yes  4. What is your medication issue? Lethargic and sore all over, and states when she is doing yoga now she feels dizzy.

## 2022-11-02 NOTE — Telephone Encounter (Signed)
Called patient, advised that since she started the medication- she has noticed she has a lot of muscle aches, burning when she urinates, dizziness with yoga (which she never use to have). She states by the end of the 2 weeks symptoms are improving, but then when she takes the shot again they return. She states she does have myalgia however, this is different. I advised I would send a message over to Dr.Hilty/RN to review and give recommendations.

## 2022-11-03 ENCOUNTER — Ambulatory Visit
Admission: RE | Admit: 2022-11-03 | Discharge: 2022-11-03 | Disposition: A | Discharge status: Home / Self Care | Payer: PPO | Source: Ambulatory Visit | Attending: Internal Medicine | Admitting: Internal Medicine

## 2022-11-03 DIAGNOSIS — E041 Nontoxic single thyroid nodule: Secondary | ICD-10-CM | POA: Diagnosis not present

## 2022-11-03 DIAGNOSIS — E042 Nontoxic multinodular goiter: Secondary | ICD-10-CM

## 2022-11-03 NOTE — Telephone Encounter (Signed)
Left message to call back Also sent message via MyChart  This could be the injectable - if it is intolerable, she may have to stop. Options are otherwise limited -we will try and pursue Leqvio (which should not have that side effect) but cost may be prohibitive.    Dr. Lemmie Evens

## 2022-11-04 NOTE — Telephone Encounter (Signed)
Called patient informed her will defer to Dr Debara Pickett for his response.    Patient states she is just concerned  with all her other aliments  and inflammation .  Her thyroid  being effected  do to inflammation that is coming from taking the Repatha

## 2022-11-04 NOTE — Telephone Encounter (Signed)
Patient stated she has concerns regarding side-effects from [place].

## 2022-11-08 NOTE — Telephone Encounter (Signed)
Follow-up with PCP or GI doctor - gene editing options are not commercially available or FDA approved.  She can follow-up with me as needed.  Dr. Debara Pickett

## 2022-11-08 NOTE — Telephone Encounter (Signed)
Pixie Casino, MD  Raiford Simmonds, RN    Prescription Omega 3's lower triglycerides -hers are already normal. Nothing else besides the PCSK9 inhibitors lower LP(a) which is significantly elevated for her.  If the medicine is intolerable, she should stop it.  We could do a benefits investigation with [employer] if she would be agreeable to trying it.  Otherwise there are limited options.  Dr. Debara Pickett    Returned call to patient and advised her of the above. Patient states that she is not interested in Roselle as she  has researched and this causes the same side effects as repatha. Patient states that she is having "bowel swelling", digestive issues, and bloating/constipation. Patient states that she is interested in going back on Pravastatin or possibly trying a supplement "CholesterolMD"- educated patient on how these would not be effective in treating her cholesterol. Patient states she is aware but wants to try something just to lower her LDL. Patient also wants to know if Dr. Debara Pickett will order her "omega -3 medication" for her inflammation not her cholesterol. Advised patient to reach out to PCP regarding her inflammation. Patient also wants to know if Dr.  Debara Pickett has any recommendations regarding her GI symptoms- advised patient to reach out to PCP regarding as well. Patient still wanted to see if Dr. Debara Pickett has any recommendations. Patient also states that she looked up "Gene Editing"therapy online for cholesterol and wants to know if this would be effective or available for her? Advised patient I would forward message. Patient verbalized understanding.

## 2022-11-08 NOTE — Telephone Encounter (Signed)
Follow Up:     Patient is calling back to find out what was decided.

## 2022-11-10 NOTE — Telephone Encounter (Signed)
Patient stated that she has muscle pain, knee pian, stringy bowel movements/diarrhea, and abdominal distention. She is not taking her last repatha injection today. She asked is she could be on a low-dose pravastatin and titrate upward. Please advise.

## 2022-11-10 NOTE — Telephone Encounter (Signed)
Patient called checking on status of her call and MyChart message.  She states today is the day she is suppose to take another injection, but she is not going to take it.  She also said she is having more GI issues, like cramping and diarrhea.

## 2022-11-10 NOTE — Telephone Encounter (Signed)
Patient sent in subsequent MyChart message inquiring about different statin. This has been sent to MD to review

## 2022-11-11 NOTE — Telephone Encounter (Signed)
Spoke with patient to inform of Dr. Lysbeth Penner reply: "I would not take the Repatha anymore - would not consider starting a statin until her GI symptoms resolve." She stated her pain is resolving a.but still with diarrhea. Recommended she stay hydrated. She will keep January 17 th appointment with Dr. Debara Pickett.

## 2022-11-11 NOTE — Telephone Encounter (Signed)
I would not take the Repatha anymore - would not consider starting a statin until her GI symptoms resolve.  Dr. Lemmie Evens

## 2022-11-22 DIAGNOSIS — M9905 Segmental and somatic dysfunction of pelvic region: Secondary | ICD-10-CM | POA: Diagnosis not present

## 2022-11-22 DIAGNOSIS — M9902 Segmental and somatic dysfunction of thoracic region: Secondary | ICD-10-CM | POA: Diagnosis not present

## 2022-11-22 DIAGNOSIS — M9901 Segmental and somatic dysfunction of cervical region: Secondary | ICD-10-CM | POA: Diagnosis not present

## 2022-11-22 DIAGNOSIS — M9903 Segmental and somatic dysfunction of lumbar region: Secondary | ICD-10-CM | POA: Diagnosis not present

## 2022-11-28 ENCOUNTER — Ambulatory Visit (INDEPENDENT_AMBULATORY_CARE_PROVIDER_SITE_OTHER): Payer: PPO | Admitting: Family Medicine

## 2022-11-28 ENCOUNTER — Encounter: Payer: Self-pay | Admitting: Family Medicine

## 2022-11-28 VITALS — BP 127/75 | HR 99 | Temp 98.2°F | Ht 61.81 in | Wt 161.0 lb

## 2022-11-28 DIAGNOSIS — E782 Mixed hyperlipidemia: Secondary | ICD-10-CM | POA: Diagnosis not present

## 2022-11-28 DIAGNOSIS — K589 Irritable bowel syndrome without diarrhea: Secondary | ICD-10-CM | POA: Diagnosis not present

## 2022-11-28 MED ORDER — DICYCLOMINE HCL 10 MG PO CAPS: 10.0000 mg | ORAL_CAPSULE | Freq: Three times a day (TID) | ORAL | 1 refills | Status: DC

## 2022-11-28 NOTE — Assessment & Plan Note (Signed)
Follows with cardiology.  Has been intolerant of statins in the past and has had significant side effects with Repatha and is no longer on this.

## 2022-11-28 NOTE — Patient Instructions (Signed)
It was very nice to see you today!  Please try the Bentyl.  Let me know if not improving in the next 2 weeks and we can refer you to GI.  Take care, Dr [name]  PLEASE NOTE:  If you had any lab tests please let us know if you have not heard back within a few days. You may see your results on mychart before we have a chance to review them but we will give you a call once they are reviewed by [place]. If we ordered any referrals today, please let us know if you have not heard from their office within the next week.   Please try these tips to maintain a healthy lifestyle:  Eat at least 3 REAL meals and 1-2 snacks per day.  Aim for no more than 5 hours between eating.  If you eat breakfast, please do so within one hour of getting up.   Each meal should contain half fruits/vegetables, one quarter protein, and one quarter carbs (no bigger than a computer mouse)  Cut down on sweet beverages. This includes juice, soda, and sweet tea.   Drink at least 1 glass of water with each meal and aim for at least 8 glasses per day  Exercise at least 150 minutes every week.

## 2022-11-28 NOTE — Assessment & Plan Note (Addendum)
Could be contributing to her GI symptoms including bloating and alternating constipation and loose stools.  She is concerned that her current symptoms may be side effect of Repatha which is possible.  We did discuss dietary modifications.  She has a reassuring exam today and no other red flag signs or symptoms.  We will try Bentyl 10 mg 4 times daily as needed.  We discussed referral to GI for further evaluation however she deferred for today.  She will let me know if not improving in the next few weeks and we can refer at that time.  We did discuss sumatriptan however she has been on this before and this caused excessive somnolence.  We will defer for today.

## 2022-11-28 NOTE — Progress Notes (Signed)
   Natalie Erickson is a 71 y.o. female who presents today for an office visit.  Assessment/Plan:  Chronic Problems Addressed Today: IBS (irritable bowel syndrome) Could be contributing to her GI symptoms including bloating and alternating constipation and loose stools.  She is concerned that her current symptoms may be side effect of Repatha which is possible.  We did discuss dietary modifications.  She has a reassuring exam today and no other red flag signs or symptoms.  We will try Bentyl 10 mg 4 times daily as needed.  We discussed referral to GI for further evaluation however she deferred for today.  She will let me know if not improving in the next few weeks and we can refer at that time.  We did discuss sumatriptan however she has been on this before and this caused excessive somnolence.  We will defer for today.  Mixed hyperlipidemia Follows with cardiology.  Has been intolerant of statins in the past and has had significant side effects with Repatha and is no longer on this.     Subjective:  HPI:  See A/p for status of chronic conditions.  Her main concern today is repatha side effects. She has been following with cardiology for this. She was on this for about 8 weeks but had significant issues with joint and muscle pain. This persisted and she contacted her cardiologist who recommended she stop the medication. Her last dose was about 2 months ago. Pain has improved but she is now having more bowel issues. She has had more abdominal bloating and alternating constipation and loose stools. No nausea or vomiting. No blood in stool. No OTC medications tried. Bloating tends to come and go. Symptoms are better if she does not eat. Worsening the last couple of weeks. No fevers or chills.        Objective:  Physical Exam: BP 127/75   Pulse 99   Temp 98.2 F (36.8 C) (Temporal)   Ht 5' 1.81" (1.57 m)   Wt 161 lb (73 kg)   SpO2 99%   BMI 29.63 kg/m   Gen: No acute distress, resting  comfortably CV: Regular rate and rhythm with no murmurs appreciated Pulm: Normal work of breathing, clear to auscultation bilaterally with no crackles, wheezes, or rhonchi GI: Soft, nontender, nondistended. Neuro: Grossly normal, moves all extremities Psych: Normal affect and thought content      Sherie Dobrowolski M. [name], MD 11/28/2022 8:54 AM

## 2022-12-12 NOTE — Patient Instructions (Signed)

## 2022-12-12 NOTE — Progress Notes (Unsigned)
No chief complaint on file.   HISTORY OF PRESENT ILLNESS:  12/12/22 ALL: Addalie returns for follow up for OSA on CPAP.   12/202/2022 ALL: Opaline returns for follow up for OSA on CPAP. She continues to do fairly well. She has not used CPAP for as long as she used to due to more IBS symptoms. She feels better when she lies on her left side. She is caring for her aging dog. She is up and doesn't with her at night. She is using a nasal pillow. She feels that she get frustrated with it but not interested in trying a new mask.   ON is well managed. She uses gabapentin only as needed.     12/10/2020 ALL:  Cianna Kasparian is a 71 y.o. female here today for follow up for OSA on CPAP therapy.  She reports that she is doing very well in therapy.  She does feel that it helps with daytime sleepiness.  She is currently seeing [facility] neurolog[facility], Dr. Sabra Heck, who is retiring at the end of the year.  She is treated with low-dose gabapentin for occipital neuralgia.  She reports that she is doing very well.  She would like to continue follow-up for both sleep and occipital neuralgia with me.  Compliance report dated 11/09/2020 through 12/08/2020 reveals that she used CPAP 28 of the past 30 days for compliance of 93%.  She used CPAP greater than 4 hours 27 of the past 30 days for compliance of 90%.  Average usage on days used was 6 hours and 55 minutes.  Residual AHI was 0.9 on 4 to 9 cm of water and an EPR of 1.  There was no significant leak noted.   HISTORY (copied from previous note)  Masaye Gatchalian is a 71 y.o. female here today for follow up of OSA on CPAP.  Xan is doing very well on CPAP therapy.  She is using CPAP nightly.  She does note significant improvement in daytime sleepiness.  She has less fatigue throughout the day.  She notes improvement in occipital neuralgia.  She is currently managed by Dr. Sabra Heck, [facility] neurology.  Her only concern is that occasionally she will wake up at night  with what she describes as gas pains.  She is also noted belching.  She is currently using a nasal pillow.  She does have a chinstrap but has not used this.  She is concerned that pressure settings are too high.   Compliance report dated 11/10/2019 through 12/09/2019 revealed that she is using CPAP every night for compliance of 100%.  Every night she used CPAP greater than 4 hours for compliance of 100%.  On average she is using CPAP 7 hours and 49 minutes.  Residual AHI 0.6 on 4 to 9 cm of water.  Pressure in the 95th percentile of 8.2 with maximum of 8.8.  There is no significant leak.   HISTORY: (copied from Dr Guadelupe Sabin note on 08/12/2019)   Dear Dr. Juleen China,    I saw your patient, Tykia Mellone, upon your kind request to my sleep clinic today for initial consultation of her sleep disorder, in particular, concern for sleep disordered breathing.  The patient is unaccompanied today.  As you know, Ms. Kentner is a 70 year old right-handed woman with an underlying medical history of irritable bowel syndrome, history of heart murmur, fibromyalgia, depression, anxiety, and hyperlipidemia, who reports snoring and excessive daytime somnolence.  I reviewed your office note from 07/12/2019.  She has  had some morning headaches.  These are dull and achy.  She had seen Dr. Jaynee Eagles in our office in 2018 for headaches. Her Epworth sleepiness score is 9 out of 24, fatigue severity score is 52 out of 63.  She reports that her husband has noted some snorting sounds, these became worse when she was tried on baclofen for neck pain.  She had a recent C-spine MRI and x-ray in July 2020.  She brought copies of the results for my review.  Her cervical spine x-ray showed multilevel degenerative disc disease.  Her MRI neck showed multilevel cervical spondylosis, most pronounced at C5-6.  She had mild canal stenosis and moderate left foraminal narrowing she was noted to have a goiter.  She is going to see a new orthopedic doctor this week.  She reports that she is supposed to have a thyroid biopsy.  She has had some depressive symptoms.  She reports side effects on antidepressant medications.  She has intermittent restless leg symptoms and takes magnesium for these symptoms as well as cramping.  She does not have nightly restless legs symptoms.  She does have nocturia once or twice per average night.  She lives with her husband.  They live with their son and his family including wife and 2 grandchildren.  She has another son in [place].  Her son that lives here has sleep apnea and has a CPAP machine.  She reports a bedtime around 930 and rise time between 630 and 7 AM.  Years ago she tried a custom-made bite guard which broke.  She did not think it helped.  She does not believe it was made for snoring or for sleep apnea but more for grinding.  She does not drink caffeine or alcohol.  She does not smoke.     REVIEW OF SYSTEMS: Out of a complete 14 system review of symptoms, the patient complains only of the following symptoms, occipital neuralgia and all other reviewed systems are negative.  ESS: 3   ALLERGIES: Allergies  Allergen Reactions   Diltiazem Shortness Of Breath   Metoprolol Shortness Of Breath   Statins Tinitus    Achy joints, muscle aches Achy joints, muscle aches   Chocolate Other (See Comments)    migraine   Cocoa Other (See Comments)    migraine migraine   Codeine Nausea And Vomiting   Cranberry Other (See Comments)    Cystitis   Latex Rash   Monosodium Glutamate Other (See Comments)    Migraine   Penicillins Rash    Has patient had a PCN reaction causing immediate rash, facial/tongue/throat swelling, SOB or lightheadedness with hypotension: Yes Has patient had a PCN reaction causing severe rash involving mucus membranes or skin necrosis: No Has patient had a PCN reaction that required hospitalization No Has patient had a PCN reaction occurring within the last 10 years: No If all of the above answers  are "NO", then may proceed with Cephalosporin use.    Antihistamines, Loratadine-Type     Throat swelling   Carvedilol Other (See Comments)    N&N, SOB , Head ache and joint pain.    Ginger    Gluten Meal    Lexapro [Escitalopram]     GI    Montelukast Other (See Comments)    Throat swelling   Other Swelling    Throat swelling   Prednisone Swelling    Throat swelling (no difficulty breathing)   Sulfites Other (See Comments)    Nausea and headaches  Vanilla    Whey    Zofran [Ondansetron Hcl] Other (See Comments)    Muscle cramps, leg tingling   Azithromycin Other (See Comments) and Diarrhea   Eggs Or Egg-Derived Products Other (See Comments)    States exacerbates her IBS symptoms     HOME MEDICATIONS: Outpatient Medications Prior to Visit  Medication Sig Dispense Refill   acetaminophen (TYLENOL) 500 MG tablet Take 1,000 mg by mouth every 6 (six) hours as needed for headache (pain).     AMBULATORY NON FORMULARY MEDICATION Take 1 tablet by mouth as needed. Medication Name: [name]     b complex vitamins capsule Take 1 capsule by mouth daily.     Cholecalciferol (VITAMIN D) 2000 units tablet Take 2,000 Units by mouth daily.      Cyanocobalamin (VITAMIN B 12 PO) Take by mouth.     dicyclomine (BENTYL) 10 MG capsule Take 1 capsule (10 mg total) by mouth 4 (four) times daily -  before meals and at bedtime. 120 capsule 1   gabapentin (NEURONTIN) 100 MG capsule Take 100 mg by mouth as needed.     Magnesium Glycinate POWD 120 mg by Does not apply route as needed.      Polyethyl Glycol-Propyl Glycol (SYSTANE ULTRA OP) Apply to eye at bedtime.      polyethylene glycol powder (GLYCOLAX/MIRALAX) 17 GM/SCOOP powder Take 17-34 g by mouth as needed. (Patient not taking: Reported on 11/28/2022)     No facility-administered medications prior to visit.     PAST MEDICAL HISTORY: Past Medical History:  Diagnosis Date   AC (acromioclavicular) joint bone spurs    c 5 and c6 and  thoracic   Allergy    food allergies   Anxiety 04/06/2017   Benign thyroid cyst sept 2020 dx   Cataract    bilateral   Cystitis    Depression    Dry eyes 04/06/2017   Elevated BP without diagnosis of hypertension 04/06/2017   lost weight >> BP improved   Fibromyalgia    Floaters    both eyes   Heart murmur    hx of 2 Echocardiograms in [place] (pt was told they were normal)   Hiatal hernia    Hip pain    and groin pain for a while   History of colon polyps    History of echocardiogram    Echo 9/19: mild LVH, EF 60-65, no RWMA, Gr 1 DD, trivial MR/TR, PASP 16   History of kidney stones    History of Lyme disease    Hypercalcemia    causes kidney stones, none recent   IBS (irritable bowel syndrome)    Mixed hyperlipidemia 04/06/2017   intol to statins due to myalgias; never tried Zetia   Occipital neuralgia    Sleep apnea    mild to moderate , uses cpap   TMJ (dislocation of temporomandibular joint)    UTI (urinary tract infection) finishes antibiotic 03-27-2020   Vasomotor rhinitis      PAST SURGICAL HISTORY: Past Surgical History:  Procedure Laterality Date   APPENDECTOMY  1959   COLONOSCOPY  2016   [facility]   COLONOSCOPY  2011   in [place] N/A 04/02/2020   Procedure: ANORECTAL EXAM UNDER ANESTHESIA WITH HEMORRHOIDECTOMY, HEMORRHOIDAL LIGATION;  Surgeon: [name], MD;  Location: [facility];  Service: General;  Laterality: N/A;  GENERAL AND LOCAL   left ovarin cyst removed  age 64's   grapefruit  sized, exp laparotomy   POLYPECTOMY     UPPER GASTROINTESTINAL ENDOSCOPY     UPPER GI ENDOSCOPY       FAMILY HISTORY: Family History  Problem Relation Age of Onset   Healthy Mother    Hypertension Mother    Healthy Father    Lung cancer Father    Breast cancer Sister    Diabetes Paternal Grandmother    Colon cancer Neg Hx    Stomach cancer Neg Hx    Heart attack Neg Hx    Heart  failure Neg Hx    Pancreatic cancer Neg Hx    Colon polyps Neg Hx    Esophageal cancer Neg Hx    Rectal cancer Neg Hx      SOCIAL HISTORY: Social History   Socioeconomic History   Marital status: Married    Spouse name: Not on file   Number of children: 2   Years of education: RN   Highest education level: Not on file  Occupational History   Occupation: Retired  Tobacco Use   Smoking status: Never   Smokeless tobacco: Never  Vaping Use   Vaping Use: Never used  Substance and Sexual Activity   Alcohol use: No   Drug use: No   Sexual activity: Yes  Other Topics Concern   Not on file  Social History Narrative   ** Merged History Encounter **       Lives at home w/ her husband and family Right-handed Caffeine: none since March 2018 2 sons 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   There is no height or weight on file to calculate BMI.   Generalized: Well developed, in no acute distress  Cardiology: normal rate and rhythm, no murmur auscultated  Respiratory: clear to auscultation bilaterally    Neurological examination  Mentation: Alert oriented to time, place, history taking. Follows all commands speech and language fluent Cranial nerve II-XII: Pupils were equal round reactive to light. Extraocular movements were full, visual field were full on confrontational test. Facial sensation and strength were normal. Head turning and shoulder shrug  were normal and symmetric. Motor: The motor testing reveals 5 over 5 strength of all 4 extremities. Good symmetric motor tone is noted throughout.  Gait and station: Gait is normal.      DIAGNOSTIC DATA (LABS, IMAGING, TESTING) - I reviewed patient records, labs, notes, testing and imaging myself where available.  Lab Results  Component Value Date   WBC 8.8 10/29/2021   HGB 13.7 10/29/2021   HCT 41.1 10/29/2021   MCV 92.8 10/29/2021   PLT 350.0 10/29/2021      Component Value Date/Time   NA 141 10/29/2021 1127   K 4.0 10/29/2021 1127   CL 103 10/29/2021 1127   CO2 28 10/29/2021 1127   GLUCOSE 76 10/29/2021 1127   BUN 19 10/29/2021 1127   CREATININE 0.81 10/29/2021 1127   CREATININE 0.70 10/28/2020 0927   CALCIUM 9.9 10/29/2021 1127   PROT 7.7 10/29/2021 1127   PROT 7.1 07/10/2017 1058   ALBUMIN 4.5 10/29/2021 1127   AST 19 10/29/2021 1127  ALT 14 10/29/2021 1127   ALKPHOS 139 (H) 10/29/2021 1127   BILITOT 0.6 10/29/2021 1127   GFRNONAA >60 08/28/2019 1846   GFRAA >60 08/28/2019 1846   Lab Results  Component Value Date   CHOL 233 (H) 05/06/2022   HDL 61.50 05/06/2022   LDLCALC 150 (H) 05/06/2022   TRIG 106.0 05/06/2022   CHOLHDL 4 05/06/2022   Lab Results  Component Value Date   HGBA1C 5.4 10/29/2021   Lab Results  Component Value Date   VITAMINB12 272 10/29/2021   Lab Results  Component Value Date   TSH 0.80 08/08/2022        No data to display           ASSESSMENT AND PLAN  71 y.o. year old female  has a past medical history of AC (acromioclavicular) joint bone spurs, Allergy, Anxiety (04/06/2017), Benign thyroid cyst (sept 2020 dx), Cataract, Cystitis, Depression, Dry eyes (04/06/2017), Elevated BP without diagnosis of hypertension (04/06/2017), Fibromyalgia, Floaters, Heart murmur, Hiatal hernia, Hip pain, History of colon polyps, History of echocardiogram, History of kidney stones, History of Lyme disease, Hypercalcemia, IBS (irritable bowel syndrome), Mixed hyperlipidemia (04/06/2017), Occipital neuralgia, Sleep apnea, TMJ (dislocation of temporomandibular joint), UTI (urinary tract infection) (finishes antibiotic 03-27-2020),  and Vasomotor rhinitis. here with   No diagnosis found.  Javia is doing very well from a sleep apnea standpoint.  Compliance report reveals excellent compliance.  She was encouraged to continue using CPAP nightly and for greater than 4 hours each night. She will let me know if she wishes to try a new mask. She will continue close follow up with [facility] for ON management (per her wish). We will see her back in 1 year, sooner if needed.  She verbalizes understanding and agreement with this plan.    No orders of the defined types were placed in this encounter.     Debbora Presto, MSN, FNP-C 12/12/2022, 1:51 PM  [facility] [address], [place], [place] [address] [phone]

## 2022-12-14 ENCOUNTER — Ambulatory Visit (INDEPENDENT_AMBULATORY_CARE_PROVIDER_SITE_OTHER): Payer: PPO | Admitting: Family Medicine

## 2022-12-14 ENCOUNTER — Encounter: Payer: Self-pay | Admitting: Family Medicine

## 2022-12-14 VITALS — BP 144/81 | HR 78 | Ht 62.0 in | Wt 158.0 lb

## 2022-12-14 DIAGNOSIS — G4733 Obstructive sleep apnea (adult) (pediatric): Secondary | ICD-10-CM | POA: Diagnosis not present

## 2022-12-21 ENCOUNTER — Telehealth: Payer: Self-pay

## 2023-01-02 DIAGNOSIS — E782 Mixed hyperlipidemia: Secondary | ICD-10-CM | POA: Diagnosis not present

## 2023-01-02 DIAGNOSIS — I700 Atherosclerosis of aorta: Secondary | ICD-10-CM | POA: Diagnosis not present

## 2023-01-02 DIAGNOSIS — E7841 Elevated Lipoprotein(a): Secondary | ICD-10-CM | POA: Diagnosis not present

## 2023-01-04 DIAGNOSIS — M5030 Other cervical disc degeneration, unspecified cervical region: Secondary | ICD-10-CM | POA: Diagnosis not present

## 2023-01-04 DIAGNOSIS — M797 Fibromyalgia: Secondary | ICD-10-CM | POA: Diagnosis not present

## 2023-01-04 DIAGNOSIS — M5481 Occipital neuralgia: Secondary | ICD-10-CM | POA: Diagnosis not present

## 2023-01-04 LAB — NMR, LIPOPROFILE
Cholesterol, Total: 217 mg/dL — ABNORMAL HIGH (ref 100–199)
HDL Particle Number: 34.5 umol/L (ref >=30.5)
HDL-C: 62 mg/dL (ref >39)
LDL Particle Number: 1572 nmol/L — ABNORMAL HIGH (ref <1000)
LDL Size: 21.6 nm (ref >20.5)
LDL-C (NIH Calc): 140 mg/dL — ABNORMAL HIGH (ref 0–99)
LP-IR Score: <25 (ref <=45)
Small LDL Particle Number: 436 nmol/L (ref <=527)
Triglycerides: 87 mg/dL (ref 0–149)

## 2023-01-04 LAB — LIPOPROTEIN A (LPA): Lipoprotein (a): 183.9 nmol/L — ABNORMAL HIGH (ref <75.0)

## 2023-01-06 ENCOUNTER — Encounter: Payer: Self-pay | Admitting: Physician Assistant

## 2023-01-06 ENCOUNTER — Ambulatory Visit: Payer: PPO | Admitting: Physician Assistant

## 2023-01-06 VITALS — BP 120/68 | HR 80 | Ht 62.0 in | Wt 158.0 lb

## 2023-01-06 DIAGNOSIS — K589 Irritable bowel syndrome without diarrhea: Secondary | ICD-10-CM | POA: Diagnosis not present

## 2023-01-06 DIAGNOSIS — K5790 Diverticulosis of intestine, part unspecified, without perforation or abscess without bleeding: Secondary | ICD-10-CM | POA: Diagnosis not present

## 2023-01-06 NOTE — Patient Instructions (Addendum)
_______________________________________________________  If you are age 72 or older, your body mass index should be between 23-30. Your Body mass index is 28.9 kg/m. If this is out of the aforementioned range listed, please consider follow up with your Primary Care Provider.  If you are age 19 or younger, your body mass index should be between 19-25. Your Body mass index is 28.9 kg/m. If this is out of the aformentioned range listed, please consider follow up with your Primary Care Provider.   ________________________________________________________  The [facility] providers would like to encourage you to use [facility] to communicate with providers for non-urgent requests or questions.  Due to long hold times on the telephone, sending your provider a message by [facility] may be a faster and more efficient way to get a response.  Please allow 48 business hours for a response.  Please remember that this is for non-urgent requests.  _______________________________________________________  We have given you samples of the following medication to take: Ibguard use as directed for abdominal bloating and or discomfort  Please purchase the following medications over the counter and take as directed: Benefiber 1 dose daily or every other day  Call if symptoms recurs or worsen. You can speak to Amy's nurse Eustaquio Maize  It was a pleasure to see you today!  Thank you for trusting me with your gastrointestinal care!

## 2023-01-06 NOTE — Progress Notes (Signed)
Subjective:    Patient ID: [name], female    DOB: Nov 28, 1951, 72 y.o.   MRN: [id number]  HPI  Shakevia is a pleasant 72 year old female, established with Dr. Loletha Carrow and also known to myself.  She was last seen here in January 2021 with complaints of rectal bleeding.  She had colonoscopy in November 2021 that showed no polyps, she did have a patchy area of erythematous mucosa, and a few diminutive scars in the distal rectum residual from hemorrhoid surgery earlier that year.  Retroflex was not done due to narrow anatomy, some decrease in sphincter tone.  Patient has history of IBS, interstitial cystitis, anxiety, fibromyalgia, occipital neuropathy, sleep apnea and somatization. She comes in today with complaints of exacerbation of IBS symptoms.  She says she was started on Repatha for hyperlipidemia but was only able to tolerate for about 8 weeks because she developed increased abdominal symptoms including abdominal bloating and swelling, lower abdominal discomfort and pelvic discomfort, narrower stools, she also had side effect of back pain.  She stopped the Repatha in October and overall has had gradual improvement in symptoms.  At the time when she was feeling the worst she had herself on a very soft diet, consuming bone broth and very bland foods.  She says her bowel movements gradually improved.  She says ever since the hemorrhoid surgery she has had a delicate balance with her bowel movements, trying to obtain regular bowel movements and avoid very loose stools because she will have leakage. She has been taking a couple of herbal supplements for her gut including slippery elm.  Her bowels are still not consistent and she tends towards some looseness.  She is asking for any advice to help with her residual symptoms. She has not been using any regular fiber supplement.,  She has been intolerant to antispasmodics in the past.  Review of Systems Pertinent positive and negative review of  systems were noted in the above HPI section.  All other review of systems was otherwise negative.   Outpatient Encounter Medications as of 01/06/2023  Medication Sig   acetaminophen (TYLENOL) 500 MG tablet Take 1,000 mg by mouth every 6 (six) hours as needed for headache (pain).   AMBULATORY NON FORMULARY MEDICATION Take 1 tablet by mouth as needed. Medication Name: [name]   Cholecalciferol (VITAMIN D) 2000 units tablet Take 2,000 Units by mouth daily.    Cyanocobalamin (VITAMIN B 12 PO) Take by mouth.   Magnesium Glycinate POWD 120 mg by Does not apply route as needed.    Polyethyl Glycol-Propyl Glycol (SYSTANE ULTRA OP) Apply to eye at bedtime.    polyethylene glycol powder (GLYCOLAX/MIRALAX) 17 GM/SCOOP powder Take 17-34 g by mouth as needed.   b complex vitamins capsule Take 1 capsule by mouth daily. (Patient not taking: Reported on 01/06/2023)   No facility-administered encounter medications on file as of 01/06/2023.   Allergies  Allergen Reactions   Diltiazem Shortness Of Breath   Metoprolol Shortness Of Breath   Statins Tinitus    Achy joints, muscle aches Achy joints, muscle aches   Chocolate Other (See Comments)    migraine   Cocoa Other (See Comments)    migraine migraine   Codeine Nausea And Vomiting   Cranberry Other (See Comments)    Cystitis   Latex Rash   Monosodium Glutamate Other (See Comments)    Migraine   Penicillins Rash    Has patient had a PCN reaction causing immediate rash, facial/tongue/throat swelling, SOB  or lightheadedness with hypotension: Yes Has patient had a PCN reaction causing severe rash involving mucus membranes or skin necrosis: No Has patient had a PCN reaction that required hospitalization No Has patient had a PCN reaction occurring within the last 10 years: No If all of the above answers are "NO", then may proceed with Cephalosporin use.    Antihistamines, Loratadine-Type     Throat swelling   Carvedilol Other (See Comments)     N&N, SOB , Head ache and joint pain.    Ginger    Gluten Meal    Lexapro [Escitalopram]     GI    Montelukast Other (See Comments)    Throat swelling   Other Swelling    Throat swelling   Prednisone Swelling    Throat swelling (no difficulty breathing)   Sulfites Other (See Comments)    Nausea and headaches   Vanilla    Whey    Zofran [Ondansetron Hcl] Other (See Comments)    Muscle cramps, leg tingling   Azithromycin Other (See Comments) and Diarrhea   Eggs Or Egg-Derived Products Other (See Comments)    States exacerbates her IBS symptoms   Patient Active Problem List   Diagnosis Date Noted   Depression, recurrent ([place]) 10/28/2020   Aortic atherosclerosis ([place]) 10/28/2020   Statin intolerance 10/28/2020   OSA on CPAP 10/28/2020   Multiple thyroid nodules 10/28/2020   Vitamin D deficiency 10/28/2020   B12 deficiency 10/28/2020   DDD (degenerative disc disease), cervical 01/08/2020   Bilateral occipital neuralgia 01/08/2020   Somatization disorder 11/14/2018   Posterior vitreous detachment of right eye 09/10/2018   IBS (irritable bowel syndrome) 01/30/2018   Fibromyalgia 06/04/2017   Chronic fatigue 06/04/2017   ANA positive 05/20/2017   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   Interstitial cystitis 05/20/2017   Dyspepsia 05/20/2017   Mixed hyperlipidemia 04/06/2017   Anxiety 04/06/2017   Social History   Socioeconomic History   Marital status: Married    Spouse name: Not on file   Number of children: 2   Years of education: RN   Highest education level: Not on file  Occupational History   Occupation: Retired  Tobacco Use   Smoking status: Never   Smokeless tobacco: Never  Vaping Use   Vaping Use: Never used  Substance and Sexual Activity   Alcohol use: No   Drug use: No   Sexual activity: Yes  Other Topics Concern   Not on file  Social History Narrative   ** Merged History Encounter **       Lives at home w/ her husband and family Right-handed Caffeine:  none since March 2018 2 sons 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 Pappalardo's family history includes Breast cancer in her sister; Diabetes in her paternal grandmother; Healthy in her father and mother; Hypertension in her mother; Lung cancer in her father.      Objective:    Vitals:   01/06/23 0853  BP: 120/68  Pulse: 80  SpO2: 99%    Physical Exam Well-developed well-nourished f older WF  in no acute distress.  Height, Weight,158  BMI 28.9  HEENT; nontraumatic normocephalic, EOMI, PE R LA, sclera  anicteric. Oropharynx;not examined today  Neck; supple, no JVD Cardiovascular; regular rate and rhythm with S1-S2, no murmur rub or gallop Pulmonary; Clear bilaterally Abdomen; soft, nontender, nondistended, no palpable mass or hepatosplenomegaly, bowel sounds are active Rectal;not done today  Skin; benign exam, no jaundice rash or appreciable lesions Extremities; no clubbing cyanosis or edema skin warm and dry Neuro/Psych; alert and oriented x4, grossly nonfocal mood and affect appropriate        Assessment & Plan:   #31 72 year old white female with history of IBS, who comes in after recent exacerbation of symptoms felt to be medication related after starting Repatha at which time she developed multiple GI symptoms as outlined above.  She has been off of this medication since October and has had gradual improvement but still has some residual lower abdominal discomfort and inconsistency with bowel movements.  Exam is benign today She has multiple allergies and has been intolerant to multiple medications.  Unable to tolerate antispasmodics in the past.  #2 colon cancer screening-up-to-date with  colonoscopy November 2021, no polyps, she does have narrowed anatomy in the rectum from hemorrhoidectomy done earlier in 2021 per Dr. Johney Maine.  #3 interstitial cystitis 4.  Anxiety 5.  Fibromyalgia 6.  Sleep apnea and CPAP use 8.  Somatization  Plan; start trial of Benefiber 1 dose daily in a glass of water Start trial of IBgard, start with 1 p.o. twice daily and titrate up as per instructions to 2 twice daily as tolerated. She will follow-up on an as-needed basis, and knows to call if she has any recurrence of symptoms.  Oshay Stranahan Genia Harold PA-C 01/06/2023   Cc: [name], MD

## 2023-01-09 NOTE — Progress Notes (Signed)
____________________________________________________________  Attending physician addendum:  Thank you for sending this case to me. I have reviewed the entire note and agree with the plan.   Ankith Edmonston Danis, MD  ____________________________________________________________  

## 2023-01-11 ENCOUNTER — Encounter: Payer: Self-pay | Admitting: Internal Medicine

## 2023-01-11 ENCOUNTER — Ambulatory Visit: Payer: PPO | Attending: Internal Medicine | Admitting: Internal Medicine

## 2023-01-11 VITALS — BP 122/76 | HR 74 | Ht 62.0 in | Wt 157.0 lb

## 2023-01-11 DIAGNOSIS — T466X5A Adverse effect of antihyperlipidemic and antiarteriosclerotic drugs, initial encounter: Secondary | ICD-10-CM

## 2023-01-11 DIAGNOSIS — E785 Hyperlipidemia, unspecified: Secondary | ICD-10-CM

## 2023-01-11 DIAGNOSIS — E7841 Elevated Lipoprotein(a): Secondary | ICD-10-CM

## 2023-01-11 DIAGNOSIS — T466X5D Adverse effect of antihyperlipidemic and antiarteriosclerotic drugs, subsequent encounter: Secondary | ICD-10-CM

## 2023-01-11 DIAGNOSIS — M791 Myalgia, unspecified site: Secondary | ICD-10-CM

## 2023-01-11 DIAGNOSIS — I700 Atherosclerosis of aorta: Secondary | ICD-10-CM | POA: Diagnosis not present

## 2023-01-11 MED ORDER — ROSUVASTATIN CALCIUM 5 MG PO TABS: 5.0000 mg | ORAL_TABLET | Freq: Every day | ORAL | 3 refills | Status: DC

## 2023-01-11 NOTE — Progress Notes (Signed)
LIPID CLINIC CONSULT NOTE  Chief Complaint:  Follow-up dyslipidemia  Primary Care Physician: [name], MD  Primary Cardiologist:  [name], MD  HPI:  Natalie Erickson is a 72 y.o. female who is being seen today for the evaluation of dyslipidemia at the request of [name], MD. This is a very pleasant 72 year old female kindly referred by her primary care provider for evaluation management of dyslipidemia.  She has been previously seen in our office by Dr. Adolm Joseph as well as more recently by Cherlynn Polo, PA-C.  She has a history of statin intolerance and was referred for evaluation management of her cholesterol.  She has been trying to manage this with diet and activity and more recently has had a reduction in her LDL cholesterol from 1 87 -> 150, total cholesterol 233, triglycerides 106 and HDL 61.  She has had prior studies including CT scan of the abdomen and pelvis which showed aortic atherosclerosis.  There is some heart disease remotely in her family.  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  She has a concern about statins as far as potential side effects.  She did seem amenable to trying red yeast rice today.  She also wanted to look further in her lipid profile with an NMR which I am certainly happy to obtain however her insurance is not likely to pay for that unless we wait several months.  Since she is interested in trying over-the-counter therapy, would make sense to try to get this information in a few months on treatment.  She reports her diet is fairly low in saturated fats in fact because of issues with IBS and fibromyalgia, she has severely limited her diet.  09/09/2022  Natalie Erickson returns today for follow-up.  She underwent a lipid NMR which shows elevated cholesterol.  Her LDL particle number is 1776, LDL-C of 154, HDL-C64 and triglycerides 75.  Her LP(a) also was significantly elevated at 281.4 nmol/L.  She reports that her sister  also has a very high LP(a) and is on Repatha.  She could also not tolerate statins.  Natalie Erickson has not been able to tolerate statins and recently tried red yeast rice which also caused her significant myalgias.  As noted above she has ASCVD with imaging evidence of aortic atherosclerosis.  01/11/2023  Natalie Erickson is seen today in follow-up. She reports that after taking Repatha for 8 weeks, she developed worsening IBS symptoms  and knee pain.  She reports stopping the Repatha but has not had total improvement in the symptoms however they are better.  She is not interested in the medication anymore.  She did have an improvement in her LP(a) with a decrease from 281 to 183 nmol/L.  Minimal improvement in her LDL particle numbers from 17 76-15 72 with an LDL of 140 down from 154.  Options are quite limited.  We discussed the possibility of Leqvio but because of the long half-life of the medication she is not interested due to side effect concerns.  She did say that she was interested in a statin.  She wanted to try the lowest dose of pravastatin to try to work up as it was tolerated.  I explained to her that statin side effects are dose-dependent and typically we like to choose the dose of the statin that would be most effective for lipid lowering.  I advised that we wanted to try to a low-dose statin we could  consider low-dose rosuvastatin which is associated with about a 35% cholesterol reduction compared to about 10 to 15% with 10 mg of pravastatin.  PMHx:  Past Medical History:  Diagnosis Date   AC (acromioclavicular) joint bone spurs    c 5 and c6 and thoracic   Allergy    food allergies   Anxiety 04/06/2017   Benign thyroid cyst sept 2020 dx   Cataract    bilateral   Cystitis    Depression    Dry eyes 04/06/2017   Elevated BP without diagnosis of hypertension 04/06/2017   lost weight >> BP improved   Fibromyalgia    Floaters    both eyes   Heart murmur    hx of 2 Echocardiograms in [place]  (pt was told they were normal)   Hiatal hernia    Hip pain    and groin pain for a while   History of colon polyps    History of echocardiogram    Echo 9/19: mild LVH, EF 60-65, no RWMA, Gr 1 DD, trivial MR/TR, PASP 16   History of kidney stones    History of Lyme disease    Hypercalcemia    causes kidney stones, none recent   IBS (irritable bowel syndrome)    Mixed hyperlipidemia 04/06/2017   intol to statins due to myalgias; never tried Zetia   Occipital neuralgia    Sleep apnea    mild to moderate , uses cpap   TMJ (dislocation of temporomandibular joint)    UTI (urinary tract infection) finishes antibiotic 03-27-2020   Vasomotor rhinitis     Past Surgical History:  Procedure Laterality Date   APPENDECTOMY  1959   COLONOSCOPY  2016   [place]   COLONOSCOPY  2011   in [place] ANESTHESIA WITH HEMORRHOIDECTOMY N/A 04/02/2020   Procedure: ANORECTAL EXAM UNDER ANESTHESIA WITH HEMORRHOIDECTOMY, HEMORRHOIDAL LIGATION;  Surgeon: [name], MD;  Location: [facility];  Service: General;  Laterality: N/A;  GENERAL AND LOCAL   left ovarin cyst removed  age 47's   grapefruit sized, exp laparotomy   POLYPECTOMY     UPPER GASTROINTESTINAL ENDOSCOPY     UPPER GI ENDOSCOPY      FAMHx:  Family History  Problem Relation Age of Onset   Healthy Mother    Hypertension Mother    Healthy Father    Lung cancer Father    Breast cancer Sister    Diabetes Paternal Grandmother    Colon cancer Neg Hx    Stomach cancer Neg Hx    Heart attack Neg Hx    Heart failure Neg Hx    Pancreatic cancer Neg Hx    Colon polyps Neg Hx    Esophageal cancer Neg Hx    Rectal cancer Neg Hx     SOCHx:   reports that she has never smoked. She has never used smokeless tobacco. She reports that she does not drink alcohol and does not use drugs.  ALLERGIES:  Allergies  Allergen Reactions   Diltiazem Shortness Of Breath   Metoprolol Shortness Of Breath    Statins Tinitus    Achy joints, muscle aches Achy joints, muscle aches   Chocolate Other (See Comments)    migraine   Cocoa Other (See Comments)    migraine migraine   Codeine Nausea And Vomiting   Cranberry Other (See Comments)    Cystitis   Latex Rash   Monosodium Glutamate Other (See Comments)    Migraine  Penicillins Rash    Has patient had a PCN reaction causing immediate rash, facial/tongue/throat swelling, SOB or lightheadedness with hypotension: Yes Has patient had a PCN reaction causing severe rash involving mucus membranes or skin necrosis: No Has patient had a PCN reaction that required hospitalization No Has patient had a PCN reaction occurring within the last 10 years: No If all of the above answers are "NO", then may proceed with Cephalosporin use.    Antihistamines, Loratadine-Type     Throat swelling   Carvedilol Other (See Comments)    N&N, SOB , Head ache and joint pain.    Ginger    Gluten Meal    Lexapro [Escitalopram]     GI    Montelukast Other (See Comments)    Throat swelling   Other Swelling    Throat swelling   Prednisone Swelling    Throat swelling (no difficulty breathing)   Sulfites Other (See Comments)    Nausea and headaches   Vanilla    Whey    Zofran [Ondansetron Hcl] Other (See Comments)    Muscle cramps, leg tingling   Azithromycin Other (See Comments) and Diarrhea   Eggs Or Egg-Derived Products Other (See Comments)    States exacerbates her IBS symptoms    ROS: Pertinent items noted in HPI and remainder of comprehensive ROS otherwise negative.  HOME MEDS: Current Outpatient Medications on File Prior to Visit  Medication Sig Dispense Refill   acetaminophen (TYLENOL) 500 MG tablet Take 1,000 mg by mouth every 6 (six) hours as needed for headache (pain).     AMBULATORY NON FORMULARY MEDICATION Take 1 tablet by mouth as needed. Medication Name: [name]     b complex vitamins capsule Take 1 capsule by mouth daily.      Cholecalciferol (VITAMIN D) 2000 units tablet Take 2,000 Units by mouth daily.      Cyanocobalamin (VITAMIN B 12 PO) Take by mouth.     Magnesium Glycinate POWD 120 mg by Does not apply route as needed.      Multiple Vitamins-Minerals (PRESERVISION AREDS PO) Take by mouth.     Polyethyl Glycol-Propyl Glycol (SYSTANE ULTRA OP) Apply to eye at bedtime.      polyethylene glycol powder (GLYCOLAX/MIRALAX) 17 GM/SCOOP powder Take 17-34 g by mouth as needed.     No current facility-administered medications on file prior to visit.    LABS/IMAGING: No results found for this or any previous visit (from the past 48 hour(s)). No results found.  LIPID PANEL:    Component Value Date/Time   CHOL 233 (H) 05/06/2022 0834   TRIG 106.0 05/06/2022 0834   HDL 61.50 05/06/2022 0834   CHOLHDL 4 05/06/2022 0834   VLDL 21.2 05/06/2022 0834   LDLCALC 150 (H) 05/06/2022 0834   LDLCALC 174 (H) 10/28/2020 0927    WEIGHTS: Wt Readings from Last 3 Encounters:  01/11/23 157 lb (71.2 kg)  01/06/23 158 lb (71.7 kg)  12/14/22 158 lb (71.7 kg)    VITALS: BP 122/76   Pulse 74   Ht '5\' 2"'$  (1.575 m)   Wt 157 lb (71.2 kg)   SpO2 97%   BMI 28.72 kg/m   EXAM: Deferred  EKG: Deferred  ASSESSMENT: Mixed dyslipidemia ASCVD with aortic atherosclerosis Statin intolerance-myalgias  PLAN: 1.   Natalie Erickson feels that she cannot tolerate Repatha.  She is interested in trying a low-dose statin again.  Will pursue rosuvastatin 5 mg daily.  Check an NMR in about 4 to 6 months  and follow-up afterwards.  Pixie Casino, MD, [facility], [place] Director of the Advanced Lipid Disorders &  Cardiovascular Risk Reduction Clinic Diplomate of the American Board of Clinical Lipidology Attending Cardiologist  Direct Dial: [phone]  Fax: [phone]  Website:  [email or web address] 01/11/2023, 10:29 AM

## 2023-01-11 NOTE — Patient Instructions (Signed)
Medication Instructions:  START rosuvastatin (crestor) '5mg'$  daily  *If you need a refill on your cardiac medications before your next appointment, please call your pharmacy*   Lab Work: FASTING lab work in 4-6 months   If you have labs (blood work) drawn today and your tests are completely normal, you will receive your results only by: [employer] (if you have MyChart) OR A paper copy in the mail If you have any lab test that is abnormal or we need to change your treatment, we will call you to review the results.   Follow-Up: At [facility], you and your health needs are our priority.  As part of our continuing mission to provide you with exceptional heart care, we have created designated Provider Care Teams.  These Care Teams include your primary Cardiologist (physician) and Advanced Practice Providers (APPs -  Physician Assistants and Nurse Practitioners) who all work together to provide you with the care you need, when you need it.  We recommend signing up for the patient portal called "MyChart".  Sign up information is provided on this After Visit Summary.  MyChart is used to connect with patients for Virtual Visits (Telemedicine).  Patients are able to view lab/test results, encounter notes, upcoming appointments, etc.  Non-urgent messages can be sent to your provider as well.   To learn more about what you can do with MyChart, go to [email or web address].    Your next appointment:    4-6 months with Dr. Debara Pickett

## 2023-01-25 DIAGNOSIS — M9901 Segmental and somatic dysfunction of cervical region: Secondary | ICD-10-CM | POA: Diagnosis not present

## 2023-01-25 DIAGNOSIS — M9903 Segmental and somatic dysfunction of lumbar region: Secondary | ICD-10-CM | POA: Diagnosis not present

## 2023-01-25 DIAGNOSIS — M9905 Segmental and somatic dysfunction of pelvic region: Secondary | ICD-10-CM | POA: Diagnosis not present

## 2023-01-25 DIAGNOSIS — M9902 Segmental and somatic dysfunction of thoracic region: Secondary | ICD-10-CM | POA: Diagnosis not present

## 2023-01-29 DIAGNOSIS — G4733 Obstructive sleep apnea (adult) (pediatric): Secondary | ICD-10-CM | POA: Diagnosis not present

## 2023-02-01 DIAGNOSIS — M9905 Segmental and somatic dysfunction of pelvic region: Secondary | ICD-10-CM | POA: Diagnosis not present

## 2023-02-01 DIAGNOSIS — M9901 Segmental and somatic dysfunction of cervical region: Secondary | ICD-10-CM | POA: Diagnosis not present

## 2023-02-01 DIAGNOSIS — M9903 Segmental and somatic dysfunction of lumbar region: Secondary | ICD-10-CM | POA: Diagnosis not present

## 2023-02-01 DIAGNOSIS — M9902 Segmental and somatic dysfunction of thoracic region: Secondary | ICD-10-CM | POA: Diagnosis not present

## 2023-02-10 ENCOUNTER — Encounter: Payer: Self-pay | Admitting: Family Medicine

## 2023-02-10 ENCOUNTER — Ambulatory Visit (INDEPENDENT_AMBULATORY_CARE_PROVIDER_SITE_OTHER): Payer: PPO | Admitting: Family Medicine

## 2023-02-10 VITALS — BP 136/78 | HR 80 | Temp 97.7°F | Ht 62.0 in | Wt 155.6 lb

## 2023-02-10 DIAGNOSIS — E559 Vitamin D deficiency, unspecified: Secondary | ICD-10-CM

## 2023-02-10 DIAGNOSIS — E538 Deficiency of other specified B group vitamins: Secondary | ICD-10-CM | POA: Diagnosis not present

## 2023-02-10 DIAGNOSIS — K589 Irritable bowel syndrome without diarrhea: Secondary | ICD-10-CM | POA: Diagnosis not present

## 2023-02-10 DIAGNOSIS — E782 Mixed hyperlipidemia: Secondary | ICD-10-CM | POA: Diagnosis not present

## 2023-02-10 DIAGNOSIS — Z0001 Encounter for general adult medical examination with abnormal findings: Secondary | ICD-10-CM | POA: Diagnosis not present

## 2023-02-10 NOTE — Assessment & Plan Note (Signed)
Check B12 

## 2023-02-10 NOTE — Assessment & Plan Note (Signed)
Following with cardiology. She is now on crestor 96m daily. She is having some myalgias with this but this is overall tolerable for now.

## 2023-02-10 NOTE — Assessment & Plan Note (Signed)
Continue management per GI. Has pending evaluation including food allergy and testing for SIBO.

## 2023-02-10 NOTE — Progress Notes (Addendum)
Chief Complaint:  Natalie Erickson is a 72 y.o. female who presents today for her annual comprehensive physical exam.    Assessment/Plan:  Chronic Problems Addressed Today: IBS (irritable bowel syndrome) Continue management per GI. Has pending evaluation including food allergy and testing for SIBO.   Vitamin D deficiency Check Vitamin D.   B12 deficiency Check B12.   Mixed hyperlipidemia Following with cardiology. She is now on crestor 19m daily. She is having some myalgias with this but this is overall tolerable for now.   Preventative Healthcare: Check labs. Will also check labs requested - see order section. Discussed with patient that she would have to follow up with naturopath for interpretation and that she may have to pay out of pocket for some tests. She voiced understanding.   UTD on mammogram. UTD on colonoscopy. UTD on flu vaccine.  Discussed shingles and pneumonia vaccine.  Deferred for today.  Patient Counseling(The following topics were reviewed and/or handout was given):  -Nutrition: Stressed importance of moderation in sodium/caffeine intake, saturated fat and cholesterol, caloric balance, sufficient intake of fresh fruits, vegetables, and fiber.  -Stressed the importance of regular exercise.   -Substance Abuse: Discussed cessation/primary prevention of tobacco, alcohol, or other drug use; driving or other dangerous activities under the influence; availability of treatment for abuse.   -Injury prevention: Discussed safety belts, safety helmets, smoke detector, smoking near bedding or upholstery.   -Sexuality: Discussed sexually transmitted diseases, partner selection, use of condoms, avoidance of unintended pregnancy and contraceptive alternatives.   -Dental health: Discussed importance of regular tooth brushing, flossing, and dental visits.  -Health maintenance and immunizations reviewed. Please refer to Health maintenance section.  Return to care in 1 year for next  preventative visit.     Subjective:  HPI:  She has no acute complaints today.   She has been following a naturopedic doctor and she was recommended several supplements to start. She also has some labs that she would like for [place] check. See order section.   Lifestyle Diet: Mediterranean diet Exercise: Yoga daily. Working with silver sneakers.      02/10/2023    1:55 PM  Depression screen PHQ 2/9  Decreased Interest 0  Down, Depressed, Hopeless 0  PHQ - 2 Score 0    Health Maintenance Due  Topic Date Due   DTaP/Tdap/Td (1 - Tdap) Never done   Medicare Annual Wellness (AWV)  10/28/2021     ROS: Per HPI, otherwise a complete review of systems was negative.   PMH:  The following were reviewed and entered/updated in epic: Past Medical History:  Diagnosis Date   AC (acromioclavicular) joint bone spurs    c 5 and c6 and thoracic   Allergy    food allergies   Anxiety 04/06/2017   Benign thyroid cyst sept 2020 dx   Cataract    bilateral   Cystitis    Depression    Dry eyes 04/06/2017   Elevated BP without diagnosis of hypertension 04/06/2017   lost weight >> BP improved   Fibromyalgia    Floaters    both eyes   Heart murmur    [place] were [place]   Hiatal hernia    Hip pain    and groin pain for a while   History of colon polyps    History of echocardiogram    Echo 9/19: mild LVH, EF 60-65, no RWMA, Gr 1 DD, trivial MR/TR, PASP 16  History of kidney stones    History of Lyme disease    Hypercalcemia    causes kidney stones, none recent   IBS (irritable bowel syndrome)    Mixed hyperlipidemia 04/06/2017   intol to statins due to myalgias; never tried Zetia   Occipital neuralgia    Sleep apnea    mild to moderate , uses cpap   TMJ (dislocation of temporomandibular joint)    UTI (urinary tract infection) finishes antibiotic 03-27-2020   Vasomotor rhinitis    Patient Active Problem List   Diagnosis Date Noted    Depression, recurrent ([place]) 10/28/2020   Aortic atherosclerosis ([place]) 10/28/2020   Statin intolerance 10/28/2020   OSA on CPAP 10/28/2020   Multiple thyroid nodules 10/28/2020   Vitamin D deficiency 10/28/2020   B12 deficiency 10/28/2020   DDD (degenerative disc disease), cervical 01/08/2020   Bilateral occipital neuralgia 01/08/2020   Somatization disorder 11/14/2018   Posterior vitreous detachment of right eye 09/10/2018   IBS (irritable bowel syndrome) 01/30/2018   Fibromyalgia 06/04/2017   Chronic fatigue 06/04/2017   ANA positive 05/20/2017   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   Interstitial cystitis 05/20/2017   Dyspepsia 05/20/2017   Mixed hyperlipidemia 04/06/2017   Anxiety 04/06/2017   Past Surgical History:  Procedure Laterality Date   APPENDECTOMY  1959   COLONOSCOPY  2016   [place]   COLONOSCOPY  2011   in [place] N/A 04/02/2020   Procedure: ANORECTAL EXAM UNDER ANESTHESIA WITH HEMORRHOIDECTOMY, HEMORRHOIDAL LIGATION;  Surgeon: [name], MD;  Location: [facility];  Service: General;  Laterality: N/A;  GENERAL AND LOCAL   left ovarin cyst removed  age 49's   grapefruit sized, exp laparotomy   POLYPECTOMY     UPPER GASTROINTESTINAL ENDOSCOPY     UPPER GI ENDOSCOPY      Family History  Problem Relation Age of Onset   Healthy Mother    Hypertension Mother    Healthy Father    Lung cancer Father    Breast cancer Sister    Diabetes Paternal Grandmother    Colon cancer Neg Hx    Stomach cancer Neg Hx    Heart attack Neg Hx    Heart failure Neg Hx    Pancreatic cancer Neg Hx    Colon polyps Neg Hx    Esophageal cancer Neg Hx    Rectal cancer Neg Hx     Medications- reviewed and updated Current Outpatient Medications  Medication Sig Dispense Refill   rosuvastatin (CRESTOR) 5 MG tablet Take 1 tablet (5 mg total) by mouth daily. 90 tablet 3   acetaminophen (TYLENOL) 500 MG tablet  Take 1,000 mg by mouth every 6 (six) hours as needed for headache (pain).     AMBULATORY NON FORMULARY MEDICATION Take 1 tablet by mouth as needed. Medication Name: [name]     b complex vitamins capsule Take 1 capsule by mouth daily.     Cholecalciferol (VITAMIN D) 2000 units tablet Take 2,000 Units by mouth daily.      Cyanocobalamin (VITAMIN B 12 PO) Take by mouth.     Magnesium Glycinate POWD 120 mg by Does not apply route as needed.      Multiple Vitamins-Minerals (PRESERVISION AREDS PO) Take by mouth.     Polyethyl Glycol-Propyl Glycol (SYSTANE ULTRA OP) Apply to eye at bedtime.      polyethylene glycol powder (GLYCOLAX/MIRALAX) 17 GM/SCOOP powder Take 17-34 g by mouth as needed.  No current facility-administered medications for this visit.    Allergies-reviewed and updated Allergies  Allergen Reactions   Diltiazem Shortness Of Breath   Metoprolol Shortness Of Breath   Statins Tinitus    Achy joints, muscle aches Achy joints, muscle aches   Chocolate Other (See Comments)    migraine   Cocoa Other (See Comments)    migraine migraine   Codeine Nausea And Vomiting   Cranberry Other (See Comments)    Cystitis   Latex Rash   Monosodium Glutamate Other (See Comments)    Migraine   Penicillins Rash    Has patient had a PCN reaction causing immediate rash, facial/tongue/throat swelling, SOB or lightheadedness with hypotension: Yes Has patient had a PCN reaction causing severe rash involving mucus membranes or skin necrosis: No Has patient had a PCN reaction that required hospitalization No Has patient had a PCN reaction occurring within the last 10 years: No If all of the above answers are "NO", then may proceed with Cephalosporin use.    Antihistamines, Loratadine-Type     Throat swelling   Carvedilol Other (See Comments)    N&N, SOB , Head ache and joint pain.    Ginger    Gluten Meal    Lexapro [Escitalopram]     GI    Montelukast Other (See Comments)     Throat swelling   Other Swelling    Throat swelling   Prednisone Swelling    Throat swelling (no difficulty breathing)   Repatha [Evolocumab] Other (See Comments)    Worsening IBS, knee pain   Sulfites Other (See Comments)    Nausea and headaches   Vanilla    Whey    Zofran [Ondansetron Hcl] Other (See Comments)    Muscle cramps, leg tingling   Azithromycin Other (See Comments) and Diarrhea   Eggs Or Egg-Derived Products Other (See Comments)    States exacerbates her IBS symptoms    Social History   Socioeconomic History   Marital status: Married    Spouse name: Not on file   Number of children: 2   Years of education: RN   Highest education level: Not on file  Occupational History   Occupation: Retired  Tobacco Use   Smoking status: Never   Smokeless tobacco: Never  Vaping Use   Vaping Use: Never used  Substance and Sexual Activity   Alcohol use: No   Drug use: No   Sexual activity: Yes  Other Topics Concern   Not on file  Social History Narrative   ** Merged History Encounter **       Lives at home w/ her husband and family Right-handed Caffeine: none since March 2018 2 sons 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 Wt Readings from Last 3 Encounters:  02/10/23 155 lb 9.6 oz (70.6 kg)  01/11/23 157 lb (71.2 kg)  01/06/23 158 lb (71.7 kg)   Gen:  NAD, resting comfortably HEENT: TMs normal bilaterally. OP clear. No thyromegaly noted.  CV: RRR with no murmurs appreciated Pulm: NWOB, CTAB with no crackles, wheezes, or  rhonchi GI: Normal bowel sounds present. Soft, Nontender, Nondistended. MSK: no edema, cyanosis, or clubbing noted Skin: warm, dry Neuro: CN2-12 grossly intact. Strength 5/5 in upper and lower extremities. Reflexes symmetric and intact bilaterally.  Psych: Normal affect and thought content     Kelechi Astarita M. [name], MD 02/10/2023 2:29 PM

## 2023-02-10 NOTE — Patient Instructions (Addendum)
It was very nice to see you today!  Please come back next week to recheck blood work  Please continue work on diet and exercise.  I will see you back in year for your next physical.  Come back sooner if needed.  Take care, Dr [name]  PLEASE NOTE:  If you had any lab tests, please let us know if you have not heard back within a few days. You may see your results on mychart before we have a chance to review them but we will give you a call once they are reviewed by [place].   If we ordered any referrals today, please let us know if you have not heard from their office within the next week.   If you had any urgent prescriptions sent in today, please check with the pharmacy within an hour of our visit to make sure the prescription was transmitted appropriately.   Please try these tips to maintain a healthy lifestyle:  Eat at least 3 REAL meals and 1-2 snacks per day.  Aim for no more than 5 hours between eating.  If you eat breakfast, please do so within one hour of getting up.   Each meal should contain half fruits/vegetables, one quarter protein, and one quarter carbs (no bigger than a computer mouse)  Cut down on sweet beverages. This includes juice, soda, and sweet tea.   Drink at least 1 glass of water with each meal and aim for at least 8 glasses per day  Exercise at least 150 minutes every week.    Preventive Care 5 Years and Older, Female Preventive care refers to lifestyle choices and visits with your health care provider that can promote health and wellness. Preventive care visits are also called wellness exams. What can I expect for my preventive care visit? Counseling Your health care provider may ask you questions about your: Medical history, including: Past medical problems. Family medical history. Pregnancy and menstrual history. History of falls. Current health, including: Memory and ability to understand (cognition). Emotional well-being. Home life and  relationship well-being. Sexual activity and sexual health. Lifestyle, including: Alcohol, nicotine or tobacco, and drug use. Access to firearms. Diet, exercise, and sleep habits. Work and work Statistician. Sunscreen use. Safety issues such as seatbelt and bike helmet use. Physical exam Your health care provider will check your: Height and weight. These may be used to calculate your BMI (body mass index). BMI is a measurement that tells if you are at a healthy weight. Waist circumference. This measures the distance around your waistline. This measurement also tells if you are at a healthy weight and may help predict your risk of certain diseases, such as type 2 diabetes and high blood pressure. Heart rate and blood pressure. Body temperature. Skin for abnormal spots. What immunizations do I need?  Vaccines are usually given at various ages, according to a schedule. Your health care provider will recommend vaccines for you based on your age, medical history, and lifestyle or other factors, such as travel or where you work. What tests do I need? Screening Your health care provider may recommend screening tests for certain conditions. This may include: Lipid and cholesterol levels. Hepatitis C test. Hepatitis B test. HIV (human immunodeficiency virus) test. STI (sexually transmitted infection) testing, if you are at risk. Lung cancer screening. Colorectal cancer screening. Diabetes screening. This is done by checking your blood sugar (glucose) after you have not eaten for a while (fasting). Mammogram. Talk with your health care provider about  how often you should have regular mammograms. BRCA-related cancer screening. This may be done if you have a family history of breast, ovarian, tubal, or peritoneal cancers. Bone density scan. This is done to screen for osteoporosis. Talk with your health care provider about your test results, treatment options, and if necessary, the need for more  tests. Follow these instructions at home: Eating and drinking  Eat a diet that includes fresh fruits and vegetables, whole grains, lean protein, and low-fat dairy products. Limit your intake of foods with high amounts of sugar, saturated fats, and salt. Take vitamin and mineral supplements as recommended by your health care provider. Do not drink alcohol if your health care provider tells you not to drink. If you drink alcohol: Limit how much you have to 0-1 drink a day. Know how much alcohol is in your drink. In the [place] one drink equals one 12 oz bottle of beer (355 mL), one 5 oz glass of wine (148 mL), or one 1 oz glass of hard liquor (44 mL). Lifestyle Brush your teeth every morning and night with fluoride toothpaste. Floss one time each day. Exercise for at least 30 minutes 5 or more days each week. Do not use any products that contain nicotine or tobacco. These products include cigarettes, chewing tobacco, and vaping devices, such as e-cigarettes. If you need help quitting, ask your health care provider. Do not use drugs. If you are sexually active, practice safe sex. Use a condom or other form of protection in order to prevent STIs. Take aspirin only as told by your health care provider. Make sure that you understand how much to take and what form to take. Work with your health care provider to find out whether it is safe and beneficial for you to take aspirin daily. Ask your health care provider if you need to take a cholesterol-lowering medicine (statin). Find healthy ways to manage stress, such as: Meditation, yoga, or listening to music. Journaling. Talking to a trusted person. Spending time with friends and family. Minimize exposure to UV radiation to reduce your risk of skin cancer. Safety Always wear your seat belt while driving or riding in a vehicle. Do not drive: If you have been drinking alcohol. Do not ride with someone who has been drinking. When you are tired or  distracted. While texting. If you have been using any mind-altering substances or drugs. Wear a helmet and other protective equipment during sports activities. If you have firearms in your house, make sure you follow all gun safety procedures. What's next? Visit your health care provider once a year for an annual wellness visit. Ask your health care provider how often you should have your eyes and teeth checked. Stay up to date on all vaccines. This information is not intended to replace advice given to you by your health care provider. Make sure you discuss any questions you have with your health care provider. Document Revised: 06/09/2021 Document Reviewed: 06/09/2021 Elsevier Patient Education  [employer].

## 2023-02-10 NOTE — Assessment & Plan Note (Signed)
Check Vitamin D.

## 2023-02-13 ENCOUNTER — Other Ambulatory Visit (INDEPENDENT_AMBULATORY_CARE_PROVIDER_SITE_OTHER): Payer: PPO

## 2023-02-13 DIAGNOSIS — E782 Mixed hyperlipidemia: Secondary | ICD-10-CM

## 2023-02-13 DIAGNOSIS — R790 Abnormal level of blood mineral: Secondary | ICD-10-CM | POA: Diagnosis not present

## 2023-02-13 DIAGNOSIS — Z0001 Encounter for general adult medical examination with abnormal findings: Secondary | ICD-10-CM

## 2023-02-13 DIAGNOSIS — K589 Irritable bowel syndrome without diarrhea: Secondary | ICD-10-CM

## 2023-02-13 DIAGNOSIS — E538 Deficiency of other specified B group vitamins: Secondary | ICD-10-CM | POA: Diagnosis not present

## 2023-02-13 DIAGNOSIS — E559 Vitamin D deficiency, unspecified: Secondary | ICD-10-CM

## 2023-02-13 LAB — COMPREHENSIVE METABOLIC PANEL
ALT: 13 U/L (ref 0–35)
AST: 17 U/L (ref 0–37)
Albumin: 4.4 g/dL (ref 3.5–5.2)
Alkaline Phosphatase: 116 U/L (ref 39–117)
BUN: 16 mg/dL (ref 6–23)
CO2: 28 mEq/L (ref 19–32)
Calcium: 9.9 mg/dL (ref 8.4–10.5)
Chloride: 103 mEq/L (ref 96–112)
Creatinine, Ser: 0.83 mg/dL (ref 0.40–1.20)
GFR: 71.02 mL/min (ref >60.00)
Glucose, Bld: 95 mg/dL (ref 70–99)
Potassium: 3.9 mEq/L (ref 3.5–5.1)
Sodium: 140 mEq/L (ref 135–145)
Total Bilirubin: 0.5 mg/dL (ref 0.2–1.2)
Total Protein: 7.2 g/dL (ref 6.0–8.3)

## 2023-02-13 LAB — CBC WITH DIFFERENTIAL/PLATELET
Basophils Absolute: 0 10*3/uL (ref 0.0–0.1)
Basophils Relative: 0.5 % (ref 0.0–3.0)
Eosinophils Absolute: 0 10*3/uL (ref 0.0–0.7)
Eosinophils Relative: 0.4 % (ref 0.0–5.0)
HCT: 40.6 % (ref 36.0–46.0)
Hemoglobin: 13.7 g/dL (ref 12.0–15.0)
Lymphocytes Relative: 15.1 % (ref 12.0–46.0)
Lymphs Abs: 1.3 10*3/uL (ref 0.7–4.0)
MCHC: 33.7 g/dL (ref 30.0–36.0)
MCV: 94.3 fl (ref 78.0–100.0)
Monocytes Absolute: 0.5 10*3/uL (ref 0.1–1.0)
Monocytes Relative: 6 % (ref 3.0–12.0)
Neutro Abs: 6.8 10*3/uL (ref 1.4–7.7)
Neutrophils Relative %: 78 % — ABNORMAL HIGH (ref 43.0–77.0)
Platelets: 363 10*3/uL (ref 150.0–400.0)
RBC: 4.3 Mil/uL (ref 3.87–5.11)
RDW: 12.7 % (ref 11.5–15.5)
WBC: 8.8 10*3/uL (ref 4.0–10.5)

## 2023-02-13 LAB — FOLATE: Folate: 17.3 ng/mL (ref >5.9)

## 2023-02-13 LAB — T4, FREE: Free T4: 0.73 ng/dL (ref 0.60–1.60)

## 2023-02-13 LAB — HIGH SENSITIVITY CRP: CRP, High Sensitivity: 5.72 mg/L — ABNORMAL HIGH (ref 0.000–5.000)

## 2023-02-13 LAB — T3, FREE: T3, Free: 3 pg/mL (ref 2.3–4.2)

## 2023-02-13 LAB — FERRITIN: Ferritin: 227.6 ng/mL (ref 10.0–291.0)

## 2023-02-13 LAB — CORTISOL: Cortisol, Plasma: 10.6 ug/dL

## 2023-02-13 LAB — VITAMIN B12: Vitamin B-12: 301 pg/mL (ref 211–911)

## 2023-02-13 LAB — VITAMIN D 25 HYDROXY (VIT D DEFICIENCY, FRACTURES): VITD: 43.31 ng/mL (ref 30.00–100.00)

## 2023-02-13 LAB — TSH: TSH: 0.57 u[IU]/mL (ref 0.35–5.50)

## 2023-02-20 LAB — CARDIO IQ(R) ADVANCED LIPID PANEL
Apolipoprotein B: 95 mg/dL — ABNORMAL HIGH (ref <90)
Cholesterol: 186 mg/dL (ref <200)
HDL LARGE: 6878 nmol/L (ref >6729)
HDL: 59 mg/dL (ref >49)
LDL Cholesterol (Calc): 107 mg/dL (calc) — ABNORMAL HIGH (ref <100)
LDL Medium: 242 nmol/L — ABNORMAL HIGH (ref <215)
LDL Particle Number: 1467 nmol/L — ABNORMAL HIGH (ref <1138)
LDL Peak Size: 224.8 Angstrom (ref >222.9)
LDL Small: 202 nmol/L — ABNORMAL HIGH (ref <142)
Lipoprotein (a): 308 nmol/L — ABNORMAL HIGH (ref <75)
Non-HDL Cholesterol (Calc): 127 mg/dL (calc) (ref <130)
Total CHOL/HDL Ratio: 3.2 calc (ref <5.0)
Triglycerides: 107 mg/dL (ref <150)

## 2023-02-20 LAB — VITAMIN A: Vitamin A (Retinoic Acid): 57 ug/dL (ref 38–98)

## 2023-02-20 LAB — MAGNESIUM, RBC: Magnesium RBC: 6.4 mg/dL (ref 4.0–6.4)

## 2023-02-20 LAB — THYROGLOBULIN ANTIBODY: Thyroglobulin Ab: <1 IU/mL (ref <=1)

## 2023-02-20 LAB — SELENIUM SERUM

## 2023-02-20 LAB — THYROID STIMULATING IMMUNOGLOBULIN: TSI: 93 % baseline (ref <140)

## 2023-02-20 LAB — THYROID PEROXIDASE ANTIBODY: Thyroperoxidase Ab SerPl-aCnc: <1 IU/mL (ref <9)

## 2023-02-20 LAB — HOMOCYSTEINE: Homocysteine: 11.3 umol/L — ABNORMAL HIGH (ref <10.4)

## 2023-02-22 DIAGNOSIS — M9902 Segmental and somatic dysfunction of thoracic region: Secondary | ICD-10-CM | POA: Diagnosis not present

## 2023-02-22 DIAGNOSIS — M9903 Segmental and somatic dysfunction of lumbar region: Secondary | ICD-10-CM | POA: Diagnosis not present

## 2023-02-22 DIAGNOSIS — M9905 Segmental and somatic dysfunction of pelvic region: Secondary | ICD-10-CM | POA: Diagnosis not present

## 2023-02-22 DIAGNOSIS — M9901 Segmental and somatic dysfunction of cervical region: Secondary | ICD-10-CM | POA: Diagnosis not present

## 2023-02-22 DIAGNOSIS — M5451 Vertebrogenic low back pain: Secondary | ICD-10-CM | POA: Diagnosis not present

## 2023-02-24 NOTE — Progress Notes (Signed)
Please inform patient of the following:  We are still waiting on a couple of her test to come back.  Overall looks like her LDL is slightly above goal range thought it is better than previous.  The rest of her labs overall look stable.  She can discuss with cardiology about her cholesterol though may be reasonable to add on Zetia to help lower her LDL.  We consider this if she is agreeable to start.  We can recheck in a year.

## 2023-02-28 NOTE — Progress Notes (Signed)
Please check with labs - not sure when they will be back.  [name]. [name], MD 02/28/2023 2:34 PM

## 2023-03-02 ENCOUNTER — Telehealth: Payer: Self-pay | Admitting: Internal Medicine

## 2023-03-02 ENCOUNTER — Other Ambulatory Visit: Payer: PPO

## 2023-03-02 ENCOUNTER — Other Ambulatory Visit: Payer: Self-pay

## 2023-03-02 ENCOUNTER — Other Ambulatory Visit: Payer: Self-pay | Admitting: *Deleted

## 2023-03-02 DIAGNOSIS — K589 Irritable bowel syndrome without diarrhea: Secondary | ICD-10-CM

## 2023-03-02 DIAGNOSIS — Z0001 Encounter for general adult medical examination with abnormal findings: Secondary | ICD-10-CM

## 2023-03-02 DIAGNOSIS — E785 Hyperlipidemia, unspecified: Secondary | ICD-10-CM

## 2023-03-02 DIAGNOSIS — E538 Deficiency of other specified B group vitamins: Secondary | ICD-10-CM

## 2023-03-02 DIAGNOSIS — E559 Vitamin D deficiency, unspecified: Secondary | ICD-10-CM | POA: Diagnosis not present

## 2023-03-02 DIAGNOSIS — E782 Mixed hyperlipidemia: Secondary | ICD-10-CM

## 2023-03-02 NOTE — Telephone Encounter (Signed)
Pt called in she states that for the last few weeks she states that she has been increasing the dose and it has been fine but now at rosuvastatin '5mg'$  she states that she is having many symptoms: bloating, back pain, stomach discomfort, diarrhea, she "restricted breathing". She states that she has had "problems" with past statins/Repatha. Can we change to another medication?

## 2023-03-02 NOTE — Telephone Encounter (Signed)
There are not many options d/t her intolerances. Could not take Repatha - no statins. Would advise stop Crestor. May be 1 or 2 alternatives which we can discuss at follow-up in May. She should get a repeat lipid prior to that appt.  Dr. Debara Pickett

## 2023-03-02 NOTE — Telephone Encounter (Signed)
Pt c/o of Chest Pain: STAT if CP now or developed within 24 hours  1. Are you having CP right now? Yes 2. Are you experiencing any other symptoms (ex. SOB, nausea, vomiting, sweating)? Not SOB but having constricted breathing   3. How long have you been experiencing CP? For longer than a week  4. Is your CP continuous or coming and going? Coming and going but is now more persistent   5. Have you taken Nitroglycerin? No  Pt c/o medication issue:  1. Name of Medication:   rosuvastatin (CRESTOR) 5 MG tablet    2. How are you currently taking this medication (dosage and times per day)?   Take 1 tablet (5 mg total) by mouth daily.   3. Are you having a reaction (difficulty breathing--STAT)? No  4. What is your medication issue? Pt is unsure if the pain is due to taking Crestor and is wanting to know if stopping the medication or switching the medication will help ?

## 2023-03-02 NOTE — Telephone Encounter (Signed)
Pt notified, she will be in fasting to [facility] by her residence for fasting lipid panel

## 2023-03-06 ENCOUNTER — Telehealth: Payer: Self-pay | Admitting: Family Medicine

## 2023-03-06 NOTE — Telephone Encounter (Signed)
Spoke with patient, refused to go to ED stated is mostly muscular pain  No chest pain, normal BP OV schedule for tomorrow with PCP  Advise if pain worsen to go to ED.

## 2023-03-06 NOTE — Telephone Encounter (Signed)
Advised to Call EMS 911. Patient refused. See previous message from [place].  Patient Name: Natalie Erickson Gender: Female DOB: February 23, 1951 Age: 72 Y 1 M 17 D Return Phone Number: [phone] (Primary) Address: City/ State/ Zip: [place] [facility] [place] [facility] [place] [facility] [place] [facility] [place] [facility] Provider Dimas Chyle- MD Contact Type Call Who Is Calling Patient / Member / Family / Caregiver Call Type Triage / Clinical Relationship To Patient Self Return Phone Number [phone] (Primary) Chief Complaint CHEST PAIN - pain, pressure, heaviness or tightness Reason for Call Symptomatic / Request for [place] states she is calling with a pt that is experiencing rib pain that feels like it is in a vice. She states that her arms are also sore on her upper arms and she has history of fibromyalgia. Translation No Nurse Assessment Nurse: Rolin Barry, RN, Levada Dy Date/Time (Eastern Time): 03/06/2023 2:50:14 PM Confirm and document reason for call. If symptomatic, describe symptoms. ---Caller states she is calling with a pt that is experiencing rib pain that feels like it is in a vice. She states that her arms are also sore on her upper arms and she has history of fibromyalgia. Caller states that sx started a week ago, sx are worsening. Does the patient have any new or worsening symptoms? ---Yes Will a triage be completed? ---Yes Related visit to physician within the last 2 weeks? ---No Does the PT have any chronic conditions? (i.e. diabetes, asthma, this includes High risk factors for pregnancy, etc.) ---Yes List chronic conditions. ---Fibromyalgia high cholesterol, Crestor, was stopped by cardiologist Is this a behavioral health or substance abuse call? ---No    Guidelines Guideline Title Affirmed Question Affirmed Notes Nurse Date/Time Eilene Ghazi Time) Chest Pain [1] Chest pain lasts > 5 minutes AND  [2] age > 95 Deaton, RN, Levada Dy 03/06/2023 2:52:25 PM Disp. Time Eilene Ghazi Time) Disposition Final User 03/06/2023 2:43:24 PM Send to Urgent Queue [place], Rip Harbour 03/06/2023 2:46:00 PM Attempt made - message left Deaton, RN, Levada Dy 03/06/2023 2:55:43 PM Call EMS 911 Now Yes Deaton, RN, Levada Dy 03/06/2023 2:57:15 PM 911 Outcome Documentation Deaton, RN, Levada Dy Reason: Caller refused. 03/06/2023 2:58:09 PM 911 Outcome Documentation Deaton, RN, Levada Dy Reason: Caller refused. Final Disposition 03/06/2023 2:55:43 PM Call EMS 911 Now Yes Deaton, RN, Cindee Lame Disagree/Comply Disagree Caller Understands Yes PreDisposition Did not know what to do Care Advice Given Per Guideline CALL EMS 911 NOW: * Immediate medical attention is needed. You need to hang up and call 911 (or an ambulance). * Triager Discretion: I'll call you back in a few minutes to be sure you were able to reach them. NOTE TO TRIAGER - IF CALLER ASKS ABOUT ASPIRIN: CARE ADVICE given per Chest Pain (Adult) guideline. Comments User: Saverio Danker, RN Date/Time Eilene Ghazi Time): 03/06/2023 2:47:05 PM Urgent call, no caller attached, per PC call was dropped when transferred by the office. Charge nurse notified that the caller is not answering on call back. User: Saverio Danker, RN Date/Time Eilene Ghazi Time): 03/06/2023 2:52:17 PM denies any recent injuries or falls. User: Saverio Danker, RN Date/Time Eilene Ghazi Time): 03/06/2023 2:59:36 PM Reached Vaughan Basta at the backline and notified her about the refusal and wanting to be seen in the office. Caller very adamant that she is not having chest pain, but states that her ribs hurt, also pain in her arms. Referrals GO TO FACILITY REFUSED

## 2023-03-06 NOTE — Telephone Encounter (Signed)
Patient states she has been experiencing rib cage pain that comes and goes for approx. 1 week. States it feels like a vice tightening around her waist. Upper arm muscles sore on both arms. States history of Fibromyalgia.   Transferred to Triage Rip Harbour).

## 2023-03-07 ENCOUNTER — Ambulatory Visit (INDEPENDENT_AMBULATORY_CARE_PROVIDER_SITE_OTHER): Payer: PPO | Admitting: Family Medicine

## 2023-03-07 VITALS — BP 119/71 | HR 72 | Temp 97.8°F | Ht 62.0 in | Wt 156.2 lb

## 2023-03-07 DIAGNOSIS — M479 Spondylosis, unspecified: Secondary | ICD-10-CM | POA: Diagnosis not present

## 2023-03-07 DIAGNOSIS — M797 Fibromyalgia: Secondary | ICD-10-CM | POA: Diagnosis not present

## 2023-03-07 DIAGNOSIS — M545 Low back pain, unspecified: Secondary | ICD-10-CM

## 2023-03-07 DIAGNOSIS — E782 Mixed hyperlipidemia: Secondary | ICD-10-CM | POA: Diagnosis not present

## 2023-03-07 NOTE — Patient Instructions (Signed)
It was very nice to see you today!  It is okay for you to stay off the Crestor.  I will refer you to see physical therapy.  You can use a heating pad and Tylenol as needed.  Please continue the gabapentin as well.  Please let us know if not improving.  Take care, Dr [name]  PLEASE NOTE:  If you had any lab tests, please let us know if you have not heard back within a few days. You may see your results on mychart before we have a chance to review them but we will give you a call once they are reviewed by [place].   If we ordered any referrals today, please let us know if you have not heard from their office within the next week.   If you had any urgent prescriptions sent in today, please check with the pharmacy within an hour of our visit to make sure the prescription was transmitted appropriately.   Please try these tips to maintain a healthy lifestyle:  Eat at least 3 REAL meals and 1-2 snacks per day.  Aim for no more than 5 hours between eating.  If you eat breakfast, please do so within one hour of getting up.   Each meal should contain half fruits/vegetables, one quarter protein, and one quarter carbs (no bigger than a computer mouse)  Cut down on sweet beverages. This includes juice, soda, and sweet tea.   Drink at least 1 glass of water with each meal and aim for at least 8 glasses per day  Exercise at least 150 minutes every week.

## 2023-03-07 NOTE — Assessment & Plan Note (Signed)
Did not tolerate Crestor due to muscle aches.  She will follow-up with cardiology soon to discuss alternatives.  She has not tolerated Repatha well in the past either.  Would consider trial of Zetia versus lower intensity statin such as pravastatin in the future.

## 2023-03-07 NOTE — Assessment & Plan Note (Signed)
Likely contributing to her back pain as well.  Will refer to physical therapy as above.  May consider referral to sports medicine if this continues to be an ongoing issue.

## 2023-03-07 NOTE — Telephone Encounter (Signed)
Patient has OV today with PCP  

## 2023-03-07 NOTE — Progress Notes (Signed)
Natalie Erickson is a 72 y.o. female who presents today for an office visit.  Assessment/Plan:  New/Acute Problems: Low Back Erickson No red flags.  Likely multifactorial in setting of known spondylosis and fibromyalgia.  She also recently started Crestor which may have caused a flareup of her fibromyalgia.  She has a reassuring exam today.  Her symptoms have improved over the last day or so since stopping her Crestor.  She will stay off for now and follow-up with cardiology soon to discuss alternative options for this.  Anticipate symptoms will continue improved since she stopped the Crestor.  She will continue current regimen of gabapentin as prescribed by neurology.  Will place referral to physical therapy.  She will let us know if not improving.  We discussed reasons to return to care.  If not improving would consider advanced imaging versus referral to sports medicine at that time.  Chronic Problems Addressed Today: Fibromyalgia Follows with neurology for this.  Flareup recently possibly related to Crestor.  She has since stopped this as above.  She has gabapentin to use as needed as was prescribed by neurology.  Spondylosis Likely contributing to her back Erickson as well.  Will refer to physical therapy as above.  May consider referral to sports medicine if this continues to be an ongoing issue.  Mixed hyperlipidemia Did not tolerate Crestor due to muscle aches.  She will follow-up with cardiology soon to discuss alternatives.  She has not tolerated Repatha well in the past either.  Would consider trial of Zetia versus lower intensity statin such as pravastatin in the future.      Subjective:  HPI:  See A/P for status of chronic conditions.  Her main concern today is low back Erickson.  This has been going on for a few weeks.  Erickson is radiating into her upper abdomen and lower chest bilaterally. It is also radiating down into her sacrum and into her legs.   We last saw her about a month ago for  her annual physical. At that time she had been on crestor for about a month. At that time was not having much issue. Since that visit she has had persistent and worsening lower back Erickson as well as diffuse muscular Erickson. She called her cardiologist who recommended that she stop her crestor. She stopped 5 days ago. She has not noticed much change in symptoms but may feel a little better today.  She is concerned that she has had a flare of her fibromyalgia.  She also does have known history of underlying degenerative disc disease and spondylosis in her back.  Tylenol seems to have helped.  She would like to see physical therapist.  No reported weakness or numbness.  No chest Erickson.  No shortness of breath.  She has been following with neurology for fibromyalgia.  Has a prescription for gabapentin that she has been using the last couple of days.  She does not like this has been working reasonably well.       Objective:  Physical Exam: BP 119/71   Pulse 72   Temp 97.8 F (36.6 C) (Temporal)   Ht '5\' 2"'$  (1.575 m)   Wt 156 lb 3.2 oz (70.9 kg)   SpO2 99%   BMI 28.57 kg/m   Gen: No acute distress, resting comfortably CV: Regular rate and rhythm with no murmurs appreciated Pulm: Normal work of breathing, clear to auscultation bilaterally with no crackles, wheezes, or rhonchi MSK: - Back: No deformities.  No midline tenderness. - Legs: No deformities.  Neurovascular intact throughout.  Strength 5 out of 5 bilaterally.  Sensation light touch intact throughout. Neuro: Grossly normal, moves all extremities Psych: Normal affect and thought content      Natalie Cathy M. [name], MD 03/07/2023 12:48 PM

## 2023-03-07 NOTE — Assessment & Plan Note (Signed)
Follows with neurology for this.  Flareup recently possibly related to Crestor.  She has since stopped this as above.  She has gabapentin to use as needed as was prescribed by neurology.

## 2023-03-13 NOTE — Progress Notes (Signed)
Please inform patient of the following:  Selenium and myeloperoxidase antibody levels are normal.  Maelle Sheaffer M. [name], MD 03/13/2023 7:31 AM
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
## 2023-03-30 ENCOUNTER — Ambulatory Visit: Payer: PPO | Attending: Family Medicine | Admitting: Physical Therapy

## 2023-03-30 ENCOUNTER — Encounter: Payer: Self-pay | Admitting: Physical Therapy

## 2023-03-30 ENCOUNTER — Other Ambulatory Visit: Payer: Self-pay

## 2023-03-30 DIAGNOSIS — M79622 Pain in left upper arm: Secondary | ICD-10-CM | POA: Diagnosis not present

## 2023-03-30 DIAGNOSIS — M546 Pain in thoracic spine: Secondary | ICD-10-CM | POA: Insufficient documentation

## 2023-03-30 DIAGNOSIS — M6281 Muscle weakness (generalized): Secondary | ICD-10-CM | POA: Insufficient documentation

## 2023-03-30 DIAGNOSIS — M479 Spondylosis, unspecified: Secondary | ICD-10-CM | POA: Insufficient documentation

## 2023-03-30 DIAGNOSIS — M79621 Pain in right upper arm: Secondary | ICD-10-CM

## 2023-03-30 DIAGNOSIS — M5459 Other low back pain: Secondary | ICD-10-CM | POA: Insufficient documentation

## 2023-04-03 LAB — OMEGA3/6 FATTY, ACID
ARACHIDONIC ACID: 7.1 % (ref 5.2–12.9)
DHA: 2.4 % (ref 1.2–3.9)
EPA/ARACHIDONIC ACID RATIO: 0.1
EPA: 0.8 % (ref 0.2–1.5)
OMEGA 3 (EPA+DHA) INDEX: 3.2 % (ref 1.4–4.9)
OMEGA 6/OMEGA 3 RATIO: 6.5 (ref 5.7–21.3)

## 2023-04-03 LAB — SELENIUM SERUM: Selenium, Blood: 118 mcg/L (ref 63–160)

## 2023-04-03 LAB — MYELOPEROXIDASE AUTO ABS: Myeloperoxidase Abs: <1 AI (ref <1.0)
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
## 2023-04-04 ENCOUNTER — Ambulatory Visit: Payer: PPO | Admitting: Physical Therapy

## 2023-04-04 ENCOUNTER — Encounter: Payer: Self-pay | Admitting: Physical Therapy

## 2023-04-04 DIAGNOSIS — M6281 Muscle weakness (generalized): Secondary | ICD-10-CM

## 2023-04-04 DIAGNOSIS — M79622 Pain in left upper arm: Secondary | ICD-10-CM

## 2023-04-04 DIAGNOSIS — M79621 Pain in right upper arm: Secondary | ICD-10-CM

## 2023-04-04 DIAGNOSIS — M5459 Other low back pain: Secondary | ICD-10-CM | POA: Diagnosis not present

## 2023-04-04 DIAGNOSIS — M546 Pain in thoracic spine: Secondary | ICD-10-CM
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
## 2023-04-14 ENCOUNTER — Ambulatory Visit: Payer: PPO | Admitting: Physical Therapy

## 2023-04-14 ENCOUNTER — Encounter: Payer: Self-pay | Admitting: Physical Therapy

## 2023-04-14 DIAGNOSIS — M5459 Other low back pain: Secondary | ICD-10-CM | POA: Diagnosis not present

## 2023-04-14 DIAGNOSIS — M546 Pain in thoracic spine: Secondary | ICD-10-CM

## 2023-04-14 DIAGNOSIS — M79622 Pain in left upper arm: Secondary | ICD-10-CM

## 2023-04-14 DIAGNOSIS — M79621 Pain in right upper arm: Secondary | ICD-10-CM

## 2023-04-14 DIAGNOSIS — M6281 Muscle weakness (generalized): Secondary | ICD-10-CM
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
## 2023-04-20 ENCOUNTER — Ambulatory Visit: Payer: PPO | Admitting: Physical Therapy

## 2023-04-20 ENCOUNTER — Encounter: Payer: Self-pay | Admitting: Physical Therapy

## 2023-04-20 DIAGNOSIS — M5459 Other low back pain: Secondary | ICD-10-CM

## 2023-04-20 DIAGNOSIS — M79622 Pain in left upper arm: Secondary | ICD-10-CM

## 2023-04-20 DIAGNOSIS — M546 Pain in thoracic spine: Secondary | ICD-10-CM

## 2023-04-20 DIAGNOSIS — M79621 Pain in right upper arm: Secondary | ICD-10-CM

## 2023-04-20 DIAGNOSIS — M6281 Muscle weakness (generalized): Secondary | ICD-10-CM
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
## 2023-04-27 ENCOUNTER — Encounter: Payer: Self-pay | Admitting: Physical Therapy

## 2023-04-27 ENCOUNTER — Ambulatory Visit: Payer: PPO | Attending: Family Medicine | Admitting: Physical Therapy

## 2023-04-27 DIAGNOSIS — M6281 Muscle weakness (generalized): Secondary | ICD-10-CM | POA: Diagnosis not present

## 2023-04-27 DIAGNOSIS — M5459 Other low back pain: Secondary | ICD-10-CM | POA: Insufficient documentation

## 2023-04-27 DIAGNOSIS — M79621 Pain in right upper arm: Secondary | ICD-10-CM

## 2023-04-27 DIAGNOSIS — M546 Pain in thoracic spine: Secondary | ICD-10-CM

## 2023-04-27 DIAGNOSIS — M79622 Pain in left upper arm: Secondary | ICD-10-CM

## 2023-04-29 DIAGNOSIS — G4733 Obstructive sleep apnea (adult) (pediatric): Secondary | ICD-10-CM | POA: Diagnosis not present

## 2023-05-03 DIAGNOSIS — M9902 Segmental and somatic dysfunction of thoracic region: Secondary | ICD-10-CM | POA: Diagnosis not present

## 2023-05-03 DIAGNOSIS — M9903 Segmental and somatic dysfunction of lumbar region: Secondary | ICD-10-CM | POA: Diagnosis not present

## 2023-05-03 DIAGNOSIS — M9905 Segmental and somatic dysfunction of pelvic region: Secondary | ICD-10-CM | POA: Diagnosis not present

## 2023-05-03 DIAGNOSIS — M9901 Segmental and somatic dysfunction of cervical region: Secondary | ICD-10-CM | POA: Diagnosis not present

## 2023-05-04 ENCOUNTER — Other Ambulatory Visit: Payer: Self-pay | Admitting: *Deleted

## 2023-05-04 DIAGNOSIS — E782 Mixed hyperlipidemia: Secondary | ICD-10-CM

## 2023-05-04 DIAGNOSIS — E785 Hyperlipidemia, unspecified: Secondary | ICD-10-CM

## 2023-05-05 DIAGNOSIS — E782 Mixed hyperlipidemia: Secondary | ICD-10-CM | POA: Diagnosis not present

## 2023-05-05 DIAGNOSIS — E785 Hyperlipidemia, unspecified: Secondary | ICD-10-CM | POA: Diagnosis not present

## 2023-05-06 LAB — LIPID PANEL
Chol/HDL Ratio: 3.9 ratio (ref 0.0–4.4)
Cholesterol, Total: 247 mg/dL — ABNORMAL HIGH (ref 100–199)
HDL: 64 mg/dL (ref >39)
LDL Chol Calc (NIH): 167 mg/dL — ABNORMAL HIGH (ref 0–99)
Triglycerides: 91 mg/dL (ref 0–149)
VLDL Cholesterol Cal: 16 mg/dL (ref 5–40)

## 2023-05-06 LAB — NMR, LIPOPROFILE
Cholesterol, Total: 240 mg/dL — ABNORMAL HIGH (ref 100–199)
HDL Particle Number: 32 umol/L (ref >=30.5)
HDL-C: 65 mg/dL (ref >39)
LDL Particle Number: 1566 nmol/L — ABNORMAL HIGH (ref <1000)
LDL Size: 21.6 nm (ref >20.5)
LDL-C (NIH Calc): 160 mg/dL — ABNORMAL HIGH (ref 0–99)
LP-IR Score: <25 (ref <=45)
Small LDL Particle Number: 432 nmol/L (ref <=527)
Triglycerides: 87 mg/dL (ref 0–149)

## 2023-05-10 DIAGNOSIS — M9903 Segmental and somatic dysfunction of lumbar region: Secondary | ICD-10-CM | POA: Diagnosis not present

## 2023-05-10 DIAGNOSIS — M9905 Segmental and somatic dysfunction of pelvic region: Secondary | ICD-10-CM | POA: Diagnosis not present

## 2023-05-10 DIAGNOSIS — M9902 Segmental and somatic dysfunction of thoracic region: Secondary | ICD-10-CM | POA: Diagnosis not present

## 2023-05-10 DIAGNOSIS — M9901 Segmental and somatic dysfunction of cervical region: Secondary | ICD-10-CM | POA: Diagnosis not present

## 2023-05-11 ENCOUNTER — Ambulatory Visit: Payer: PPO | Attending: Internal Medicine | Admitting: Internal Medicine

## 2023-05-11 ENCOUNTER — Encounter: Payer: Self-pay | Admitting: Internal Medicine

## 2023-05-11 VITALS — BP 124/74 | HR 75 | Ht 62.0 in | Wt 162.4 lb

## 2023-05-11 DIAGNOSIS — M791 Myalgia, unspecified site: Secondary | ICD-10-CM | POA: Diagnosis not present

## 2023-05-11 DIAGNOSIS — E7841 Elevated Lipoprotein(a): Secondary | ICD-10-CM

## 2023-05-11 DIAGNOSIS — I700 Atherosclerosis of aorta: Secondary | ICD-10-CM | POA: Diagnosis not present

## 2023-05-11 DIAGNOSIS — E782 Mixed hyperlipidemia: Secondary | ICD-10-CM | POA: Diagnosis not present

## 2023-05-11 DIAGNOSIS — T466X5D Adverse effect of antihyperlipidemic and antiarteriosclerotic drugs, subsequent encounter: Secondary | ICD-10-CM | POA: Diagnosis not present

## 2023-05-11 MED ORDER — PRAVASTATIN SODIUM 10 MG PO TABS: 10.0000 mg | ORAL_TABLET | Freq: Every day | ORAL | 6 refills | Status: DC

## 2023-05-11 NOTE — Progress Notes (Addendum)
LIPID CLINIC CONSULT NOTE  Chief Complaint:  Follow-up dyslipidemia, palpitations  Primary Care Physician: Ardith Dark, MD  Primary Cardiologist:  Parke Poisson, MD  HPI:  Tirza Mauler is a 72 y.o. female who is being seen today for the evaluation of dyslipidemia at the request of Ardith Dark, MD. This is a very pleasant 72 year old female kindly referred by her primary care provider for evaluation management of dyslipidemia.  She has been previously seen in our office by Dr. Alveta Heimlich as well as more recently by Blima Rich, PA-C.  She has a history of statin intolerance and was referred for evaluation management of her cholesterol.  She has been trying to manage this with diet and activity and more recently has had a reduction in her LDL cholesterol from 1 87 -> 150, total cholesterol 233, triglycerides 106 and HDL 61.  She has had prior studies including CT scan of the abdomen and pelvis which showed aortic atherosclerosis.  There is some heart disease remotely in her family.  She is a retired Engineer, civil (consulting) and has done reiki therapy and other things.  She has a concern about statins as far as potential side effects.  She did seem amenable to trying red yeast rice today.  She also wanted to look further in her lipid profile with an NMR which I am certainly happy to obtain however her insurance is not likely to pay for that unless we wait several months.  Since she is interested in trying over-the-counter therapy, would make sense to try to get this information in a few months on treatment.  She reports her diet is fairly low in saturated fats in fact because of issues with IBS and fibromyalgia, she has severely limited her diet.  09/09/2022  Ms. Hogle returns today for follow-up.  She underwent a lipid NMR which shows elevated cholesterol.  Her LDL particle number is 1776, LDL-C of 154, HDL-C64 and triglycerides 75.  Her LP(a) also was significantly elevated at 281.4 nmol/L.  She reports  that her sister also has a very high LP(a) and is on Repatha.  She could also not tolerate statins.  Ms. Rowlee has not been able to tolerate statins and recently tried red yeast rice which also caused her significant myalgias.  As noted above she has ASCVD with imaging evidence of aortic atherosclerosis.  01/11/2023  Ms. Filion is seen today in follow-up. She reports that after taking Repatha for 8 weeks, she developed worsening IBS symptoms  and knee pain.  She reports stopping the Repatha but has not had total improvement in the symptoms however they are better.  She is not interested in the medication anymore.  She did have an improvement in her LP(a) with a decrease from 281 to 183 nmol/L.  Minimal improvement in her LDL particle numbers from 17 76-15 72 with an LDL of 140 down from 154.  Options are quite limited.  We discussed the possibility of Leqvio but because of the long half-life of the medication she is not interested due to side effect concerns.  She did say that she was interested in a statin.  She wanted to try the lowest dose of pravastatin to try to work up as it was tolerated.  I explained to her that statin side effects are dose-dependent and typically we like to choose the dose of the statin that would be most effective for lipid lowering.  I advised that we wanted to try to a low-dose statin we  could consider low-dose rosuvastatin which is associated with about a 35% cholesterol reduction compared to about 10 to 15% with 10 mg of pravastatin.  05/11/2023  Ms. Massett returns for follow-up. Unfortunately, she was unable to tolerate 5 mg of rosuvastatin.  She did have repeat lipids which show persistently elevated LDL cholesterol at 160 with particle #[id number].  We discussed the very few options she may have for additional therapy but she again mentioned that she wanted to consider trying 10 mg of pravastatin, and a low-dose of medication.  He did report that recently she had had some palpitations  although was diagnosed with sleep apnea and is on CPAP with complete resolution of them.  PMHx:  Past Medical History:  Diagnosis Date   AC (acromioclavicular) joint bone spurs    c 5 and c6 and thoracic   Allergy    food allergies   Anxiety 04/06/2017   Benign thyroid cyst sept 2020 dx   Cataract    bilateral   Cystitis    Depression    Dry eyes 04/06/2017   Elevated BP without diagnosis of hypertension 04/06/2017   lost weight >> BP improved   Fibromyalgia    Floaters    both eyes   Heart murmur    hx of 2 Echocardiograms in [place] (pt was told they were normal)   Hiatal hernia    Hip pain    and groin pain for a while   History of colon polyps    History of echocardiogram    Echo 9/19: mild LVH, EF 60-65, no RWMA, Gr 1 DD, trivial MR/TR, PASP 16   History of kidney stones    History of Lyme disease    Hypercalcemia    causes kidney stones, none recent   IBS (irritable bowel syndrome)    Mixed hyperlipidemia 04/06/2017   intol to statins due to myalgias; never tried Zetia   Occipital neuralgia    Sleep apnea    mild to moderate , uses cpap   TMJ (dislocation of temporomandibular joint)    UTI (urinary tract infection) finishes antibiotic 03-27-2020   Vasomotor rhinitis     Past Surgical History:  Procedure Laterality Date   APPENDECTOMY  1959   COLONOSCOPY  2016   [place]   COLONOSCOPY  2011   in [place]    EVALUATION UNDER ANESTHESIA WITH HEMORRHOIDECTOMY N/A 04/02/2020   Procedure: ANORECTAL EXAM UNDER ANESTHESIA WITH HEMORRHOIDECTOMY, HEMORRHOIDAL LIGATION;  Surgeon: Karie Soda, MD;  Location: [facility];  Service: General;  Laterality: N/A;  GENERAL AND LOCAL   left ovarin cyst removed  age 34's   grapefruit sized, exp laparotomy   POLYPECTOMY     UPPER GASTROINTESTINAL ENDOSCOPY     UPPER GI ENDOSCOPY      FAMHx:  Family History  Problem Relation Age of Onset   Healthy Mother    Hypertension Mother    Healthy Father     Lung cancer Father    Breast cancer Sister    Diabetes Paternal Grandmother    Colon cancer Neg Hx    Stomach cancer Neg Hx    Heart attack Neg Hx    Heart failure Neg Hx    Pancreatic cancer Neg Hx    Colon polyps Neg Hx    Esophageal cancer Neg Hx    Rectal cancer Neg Hx     SOCHx:   reports that she has never smoked. She has never used smokeless tobacco. She reports that she does  not drink alcohol and does not use drugs.  ALLERGIES:  Allergies  Allergen Reactions   Diltiazem Shortness Of Breath   Metoprolol Shortness Of Breath   Statins Tinitus    Achy joints, muscle aches Achy joints, muscle aches   Chocolate Other (See Comments)    migraine   Cocoa Other (See Comments)    migraine migraine   Codeine Nausea And Vomiting   Cranberry Other (See Comments)    Cystitis   Latex Rash   Monosodium Glutamate Other (See Comments)    Migraine   Penicillins Rash    Has patient had a PCN reaction causing immediate rash, facial/tongue/throat swelling, SOB or lightheadedness with hypotension: Yes Has patient had a PCN reaction causing severe rash involving mucus membranes or skin necrosis: No Has patient had a PCN reaction that required hospitalization No Has patient had a PCN reaction occurring within the last 10 years: No If all of the above answers are "NO", then may proceed with Cephalosporin use.    Antihistamines, Loratadine-Type     Throat swelling   Carvedilol Other (See Comments)    N&N, SOB , Head ache and joint pain.    Ginger    Gluten Meal    Lexapro [Escitalopram]     GI    Montelukast Other (See Comments)    Throat swelling   Other Swelling    Throat swelling   Prednisone Swelling    Throat swelling (no difficulty breathing)   Repatha [Evolocumab] Other (See Comments)    Worsening IBS, knee pain   Sulfites Other (See Comments)    Nausea and headaches   Vanilla    Whey    Zofran [Ondansetron Hcl] Other (See Comments)    Muscle cramps, leg tingling    Azithromycin Other (See Comments) and Diarrhea   Egg-Derived Products Other (See Comments)    States exacerbates her IBS symptoms    ROS: Pertinent items noted in HPI and remainder of comprehensive ROS otherwise negative.  HOME MEDS: Current Outpatient Medications on File Prior to Visit  Medication Sig Dispense Refill   acetaminophen (TYLENOL) 500 MG tablet Take 1,000 mg by mouth every 6 (six) hours as needed for headache (pain).     AMBULATORY NON FORMULARY MEDICATION Take 1 tablet by mouth as needed. Medication Name: Currie Paris     Cholecalciferol (VITAMIN D) 2000 units tablet Take 2,000 Units by mouth daily.      co-enzyme Q-10 30 MG capsule Take 100 mg by mouth daily.     Cyanocobalamin (VITAMIN B 12 PO) Take by mouth.     Magnesium Glycinate POWD 120 mg by Does not apply route as needed.      Multiple Vitamins-Minerals (PRESERVISION AREDS PO) Take by mouth.     omega-3 acid ethyl esters (LOVAZA) 1 g capsule Take 1,000 mg by mouth 2 (two) times daily.     Polyethyl Glycol-Propyl Glycol (SYSTANE ULTRA OP) Apply to eye at bedtime.      b complex vitamins capsule Take 1 capsule by mouth daily. (Patient not taking: Reported on 03/30/2023)     polyethylene glycol powder (GLYCOLAX/MIRALAX) 17 GM/SCOOP powder Take 17-34 g by mouth as needed. (Patient not taking: Reported on 03/30/2023)     No current facility-administered medications on file prior to visit.    LABS/IMAGING: No results found for this or any previous visit (from the past 48 hour(s)). No results found.  LIPID PANEL:    Component Value Date/Time   CHOL 247 (H) 05/05/2023 6045  TRIG 91 05/05/2023 0819   HDL 64 05/05/2023 0819   CHOLHDL 3.9 05/05/2023 0819   CHOLHDL 3.2 02/13/2023 0839   VLDL 21.2 05/06/2022 0834   LDLCALC 167 (H) 05/05/2023 0819   LDLCALC 107 (H) 02/13/2023 0839    WEIGHTS: Wt Readings from Last 3 Encounters:  05/11/23 162 lb 6.4 oz (73.7 kg)  03/07/23 156 lb 3.2 oz (70.9 kg)  02/10/23 155 lb  9.6 oz (70.6 kg)    VITALS: BP 124/74   Pulse 75   Ht 5\' 2"  (1.575 m)   Wt 162 lb 6.4 oz (73.7 kg)   SpO2 99%   BMI 29.70 kg/m   EXAM: Deferred  EKG: Normal sinus rhythm at 75, nonspecific ST changes-personally reviewed  ASSESSMENT: Mixed dyslipidemia ASCVD with aortic atherosclerosis Statin intolerance-myalgias Repatha intolerance Fibromyalgia IBS-C Palpitations OSA on CPAP  PLAN: 1.   Ms. Randa is now interested in low-dose pravastatin as an option.  Will go ahead and prescribe that for her to try.  She said that she might consider trying Repatha again although she said she did not tolerate it before.  I advised her to not start the pravastatin until she has recovered completely from the myalgias associated with rosuvastatin.  Plan follow-up with repeat lipids in about 3 to 4 months.  Chrystie Nose, MD, [facility], FACP  [place]  [facility]  Medical Director of the Advanced Lipid Disorders &  Cardiovascular Risk Reduction Clinic Diplomate of the American Board of Clinical Lipidology Attending Cardiologist  Direct Dial: [phone]  Fax: [phone]  Website:  [email or web address] Nicely Adreana Coull 05/11/2023, 3:04 PM

## 2023-05-11 NOTE — Patient Instructions (Signed)
Medication Instructions:  Start Pravastatin 10 mg daily  Continue all other medications *If you need a refill on your cardiac medications before your next appointment, please call your pharmacy*   Lab Work: Lipid panel in 3 months   Testing/Procedures: None ordered   Follow-Up: At [facility], you and your health needs are our priority.  As part of our continuing mission to provide you with exceptional heart care, we have created designated Provider Care Teams.  These Care Teams include your primary Cardiologist (physician) and Advanced Practice Providers (APPs -  Physician Assistants and Nurse Practitioners) who all work together to provide you with the care you need, when you need it.  We recommend signing up for the patient portal called "MyChart".  Sign up information is provided on this After Visit Summary.  MyChart is used to connect with patients for Virtual Visits (Telemedicine).  Patients are able to view lab/test results, encounter notes, upcoming appointments, etc.  Non-urgent messages can be sent to your provider as well.   To learn more about what you can do with MyChart, go to [email or web address].    Your next appointment:  3 months    Provider:  Dr.Hilty

## 2023-05-12 ENCOUNTER — Other Ambulatory Visit: Payer: Self-pay | Admitting: Family Medicine

## 2023-05-12 DIAGNOSIS — Z1231 Encounter for screening mammogram for malignant neoplasm of breast: Secondary | ICD-10-CM

## 2023-05-19 ENCOUNTER — Ambulatory Visit
Admission: RE | Admit: 2023-05-19 | Discharge: 2023-05-19 | Disposition: A | Discharge status: Home / Self Care | Payer: PPO | Source: Ambulatory Visit | Attending: Family Medicine | Admitting: Family Medicine

## 2023-05-19 DIAGNOSIS — Z1231 Encounter for screening mammogram for malignant neoplasm of breast: Secondary | ICD-10-CM | POA: Diagnosis not present

## 2023-05-23 ENCOUNTER — Ambulatory Visit (INDEPENDENT_AMBULATORY_CARE_PROVIDER_SITE_OTHER): Payer: PPO | Admitting: Family Medicine

## 2023-05-23 ENCOUNTER — Encounter: Payer: Self-pay | Admitting: Family Medicine

## 2023-05-23 VITALS — BP 124/80 | HR 65 | Temp 97.7°F | Ht 62.0 in | Wt 157.4 lb

## 2023-05-23 DIAGNOSIS — I8390 Asymptomatic varicose veins of unspecified lower extremity: Secondary | ICD-10-CM | POA: Insufficient documentation

## 2023-05-23 DIAGNOSIS — E782 Mixed hyperlipidemia: Secondary | ICD-10-CM | POA: Diagnosis not present

## 2023-05-23 DIAGNOSIS — R21 Rash and other nonspecific skin eruption: Secondary | ICD-10-CM | POA: Diagnosis not present

## 2023-05-23 MED ORDER — DOXYCYCLINE HYCLATE 100 MG PO TABS
100.0000 mg | ORAL_TABLET | Freq: Two times a day (BID) | ORAL | 0 refills | Status: DC
Start: 2023-05-23 — End: 2023-08-08

## 2023-05-23 NOTE — Patient Instructions (Signed)
It was very nice to see you today!  Please start the doxycycline.  Will also refer you to see the vascular surgeon.  Return if symptoms worsen or fail to improve.   Take care, Dr Jimmey Ralph  PLEASE NOTE:  If you had any lab tests, please let us know if you have not heard back within a few days. You may see your results on mychart before we have a chance to review them but we will give you a call once they are reviewed by [place].   If we ordered any referrals today, please let us know if you have not heard from their office within the next week.   If you had any urgent prescriptions sent in today, please check with the pharmacy within an hour of our visit to make sure the prescription was transmitted appropriately.   Please try these tips to maintain a healthy lifestyle:  Eat at least 3 REAL meals and 1-2 snacks per day.  Aim for no more than 5 hours between eating.  If you eat breakfast, please do so within one hour of getting up.   Each meal should contain half fruits/vegetables, one quarter protein, and one quarter carbs (no bigger than a computer mouse)  Cut down on sweet beverages. This includes juice, soda, and sweet tea.   Drink at least 1 glass of water with each meal and aim for at least 8 glasses per day  Exercise at least 150 minutes every week.

## 2023-05-23 NOTE — Assessment & Plan Note (Signed)
Currently asymptomatic however she is possibly interested in removal.  Will place referral to vascular surgery.

## 2023-05-23 NOTE — Assessment & Plan Note (Signed)
Prescribed pravastatin however she has not yet started due to concern for worsening her muscle aches.  She will start once her current muscle aches resolve.

## 2023-05-23 NOTE — Progress Notes (Signed)
   Natalie Erickson is a 72 y.o. female who presents today for an office visit.  Assessment/Plan:  New/Acute Problems: Rash Likely local inflammatory reaction related to recent tick bite however would be reasonable to cover for cellulitis with course of doxycycline.  She is having some systemic symptoms including myalgias and malaise however does not have any rash consistent with Lyme disease or RMSF.  Regardless, this should be covered with above course of doxycycline.  Discussed with patient limited utility for checking for antibodies at this point she will let us know if not improving in the next several weeks.  Chronic Problems Addressed Today: Varicose vein of leg Currently asymptomatic however she is possibly interested in removal.  Will place referral to vascular surgery.  Mixed hyperlipidemia Prescribed pravastatin however she has not yet started due to concern for worsening her muscle aches.  She will start once her current muscle aches resolve.     Subjective:  HPI:  Patient here with rash.  Tick bite on posterior left leg about 2 weeks ago.  Tick was very small and she took it off.  Since then has noticed redness to the area.  She is also developed diffuse bodyaches and some chills.  Some headache.  No rash anywhere else on her body.  A lot of fatigue.  She has also noticed worsening varicose veins in left lower extremity.  Was very puffy a few days ago.  Seems to be improving slightly.       Objective:  Physical Exam: BP 124/80   Pulse 65   Temp 97.7 F (36.5 C) (Temporal)   Ht 5\' 2"  (1.575 m)   Wt 157 lb 6.4 oz (71.4 kg)   SpO2 97%   BMI 28.79 kg/m   Gen: No acute distress, resting comfortably CV: Regular rate and rhythm with no murmurs appreciated Pulm: Normal work of breathing, clear to auscultation bilaterally with no crackles, wheezes, or rhonchi MUSCULOSKELETAL - Left Leg: Varicosities present posterior calf with small area of erythema.  Neuro: Grossly  normal, moves all extremities Psych: Normal affect and thought content      Terance Pomplun M. Jimmey Ralph, MD 05/23/2023 11:40 AM

## 2023-06-19 ENCOUNTER — Other Ambulatory Visit: Payer: Self-pay | Admitting: *Deleted

## 2023-06-19 DIAGNOSIS — I8393 Asymptomatic varicose veins of bilateral lower extremities: Secondary | ICD-10-CM

## 2023-07-03 ENCOUNTER — Ambulatory Visit (HOSPITAL_COMMUNITY)
Admission: RE | Admit: 2023-07-03 | Discharge: 2023-07-03 | Disposition: A | Discharge status: Home / Self Care | Payer: PPO | Source: Ambulatory Visit | Attending: Surgery | Admitting: Surgery

## 2023-07-03 ENCOUNTER — Encounter: Payer: Self-pay | Admitting: Physician Assistant

## 2023-07-03 ENCOUNTER — Ambulatory Visit (INDEPENDENT_AMBULATORY_CARE_PROVIDER_SITE_OTHER): Payer: PPO | Admitting: Physician Assistant

## 2023-07-03 VITALS — BP 131/74 | HR 69 | Temp 98.4°F | Resp 16 | Ht 62.0 in | Wt 157.3 lb

## 2023-07-03 DIAGNOSIS — I8392 Asymptomatic varicose veins of left lower extremity: Secondary | ICD-10-CM

## 2023-07-03 DIAGNOSIS — I8393 Asymptomatic varicose veins of bilateral lower extremities: Secondary | ICD-10-CM | POA: Insufficient documentation

## 2023-07-03 NOTE — Progress Notes (Signed)
VASCULAR & VEIN SPECIALISTS           OF [place]  History and Physical   Natalie Erickson is a 72 y.o. female who presents with new varicosity left lower leg.   She states that she recently went to her PCP after getting bitten by a tick.  She was treated for this.  She states that while she was there, she noticed a new large vein in the lateral aspect of the left lower leg.  She has since been wearing knee high compression, which has helped her.  She denies any swelling in her legs.   She does not have any hx of DVT.  She has never had any venous procedures on her legs.  She does not have skin color changes.  She does not know of any family hx of venous issues.    She is a retired Charity fundraiser.  While her children were in middle school, she worked in [employer] and was did a lot of standing at that time.  She has fibromyalgia and remains very active doing yoga.    The pt does have hereditary elevated cholesterol and statins cause her to have muscle aches.  She has tried Repatha as well, but did not tolerate that either.  She is currently on Pravastatin but it does also cause her some muscle aches.   The pt is on a statin for cholesterol management.  The pt is not on a daily aspirin.   Other AC:  none The pt not on medication for hypertension.   The pt is not on medication for diabetes.   Tobacco hx:  never  Pt does not have family hx of AAA.  Past Medical History:  Diagnosis Date   AC (acromioclavicular) joint bone spurs    c 5 and c6 and thoracic   Allergy    food allergies   Anxiety 04/06/2017   Benign thyroid cyst sept 2020 dx   Cataract    bilateral   Cystitis    Depression    Dry eyes 04/06/2017   Elevated BP without diagnosis of hypertension 04/06/2017   lost weight >> BP improved   Fibromyalgia    Floaters    both eyes   Heart murmur    hx of 2 Echocardiograms in [place] (pt was told they were normal)   Hiatal hernia    Hip pain    and groin pain for a  while   History of colon polyps    History of echocardiogram    Echo 9/19: mild LVH, EF 60-65, no RWMA, Gr 1 DD, trivial MR/TR, PASP 16   History of kidney stones    History of Lyme disease    Hypercalcemia    causes kidney stones, none recent   IBS (irritable bowel syndrome)    Mixed hyperlipidemia 04/06/2017   intol to statins due to myalgias; never tried Zetia   Occipital neuralgia    Sleep apnea    mild to moderate , uses cpap   TMJ (dislocation of temporomandibular joint)    UTI (urinary tract infection) finishes antibiotic 03-27-2020   Vasomotor rhinitis     Past Surgical History:  Procedure Laterality Date   APPENDECTOMY  1959   COLONOSCOPY  2016   [place]   COLONOSCOPY  2011   in [place]    EVALUATION UNDER ANESTHESIA WITH HEMORRHOIDECTOMY N/A 04/02/2020   Procedure: ANORECTAL EXAM UNDER ANESTHESIA WITH HEMORRHOIDECTOMY, HEMORRHOIDAL LIGATION;  Surgeon:  Karie Soda, MD;  Location: [facility];  Service: General;  Laterality: N/A;  GENERAL AND LOCAL   left ovarin cyst removed  age 14's   grapefruit sized, exp laparotomy   POLYPECTOMY     UPPER GASTROINTESTINAL ENDOSCOPY     UPPER GI ENDOSCOPY      Social History   Socioeconomic History   Marital status: Married    Spouse name: Not on file   Number of children: 2   Years of education: RN   Highest education level: Not on file  Occupational History   Occupation: Retired  Tobacco Use   Smoking status: Never   Smokeless tobacco: Never  Vaping Use   Vaping Use: Never used  Substance and Sexual Activity   Alcohol use: No   Drug use: No   Sexual activity: Yes  Other Topics Concern   Not on file  Social History Narrative   ** Merged History Encounter **       Lives at home w/ her husband and family Right-handed Caffeine: none since March 2018 2 sons 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   Social Determinants of Corporate investment banker Strain: Not on [employer]  Insecurity: Not on file  Transportation Needs: Not on file  Physical Activity: Not on file  Stress: Not on file  Social Connections: Not on file  Intimate Partner Violence: Not on file     Family History  Problem Relation Age of Onset   Healthy Mother    Hypertension Mother    Healthy Father    Lung cancer Father    Breast cancer Sister    Diabetes Paternal Grandmother    Colon cancer Neg Hx    Stomach cancer Neg Hx    Heart attack Neg Hx    Heart failure Neg Hx    Pancreatic cancer Neg Hx    Colon polyps Neg Hx    Esophageal cancer Neg Hx    Rectal cancer Neg Hx     Current Outpatient Medications  Medication Sig Dispense Refill   acetaminophen (TYLENOL) 500 MG tablet Take 1,000 mg by mouth every 6 (six) hours as needed for headache (pain).     AMBULATORY NON FORMULARY MEDICATION Take 1 tablet by mouth as needed. Medication Name: Currie Paris     Cholecalciferol (VITAMIN D) 2000 units tablet Take 2,000 Units by mouth daily.      co-enzyme Q-10 30 MG capsule Take 100 mg by mouth daily. (Patient not taking: Reported on 05/23/2023)     doxycycline (VIBRA-TABS) 100 MG tablet Take 1 tablet (100 mg total) by mouth 2 (two) times daily. 14 tablet 0   Magnesium Glycinate POWD 120 mg by Does not apply route as needed.      omega-3 acid ethyl esters (LOVAZA) 1 g capsule Take 1,000 mg by mouth 2 (two) times daily.     Polyethyl Glycol-Propyl Glycol (SYSTANE ULTRA OP) Apply to eye at bedtime.      polyethylene glycol powder (GLYCOLAX/MIRALAX) 17 GM/SCOOP powder Take 17-34 g by mouth as needed.     pravastatin (PRAVACHOL) 10 MG tablet Take 1 tablet (10 mg total) by mouth daily. (Patient not taking: Reported on 05/23/2023) 30 tablet 6   No current facility-administered medications for this visit.    Allergies  Allergen Reactions   Diltiazem Shortness Of Breath   Metoprolol Shortness Of Breath   Statins Tinitus    Achy joints, muscle aches Achy joints, muscle aches  Chocolate Other  (See Comments)    migraine   Cocoa Other (See Comments)    migraine migraine   Codeine Nausea And Vomiting   Cranberry Other (See Comments)    Cystitis   Latex Rash   Monosodium Glutamate Other (See Comments)    Migraine   Penicillins Rash    Has patient had a PCN reaction causing immediate rash, facial/tongue/throat swelling, SOB or lightheadedness with hypotension: Yes Has patient had a PCN reaction causing severe rash involving mucus membranes or skin necrosis: No Has patient had a PCN reaction that required hospitalization No Has patient had a PCN reaction occurring within the last 10 years: No If all of the above answers are "NO", then may proceed with Cephalosporin use.    Antihistamines, Loratadine-Type     Throat swelling   Carvedilol Other (See Comments)    N&N, SOB , Head ache and joint pain.    Ginger    Gluten Meal    Lexapro [Escitalopram]     GI    Montelukast Other (See Comments)    Throat swelling   Other Swelling    Throat swelling   Prednisone Swelling    Throat swelling (no difficulty breathing)   Repatha [Evolocumab] Other (See Comments)    Worsening IBS, knee pain   Sulfites Other (See Comments)    Nausea and headaches   Vanilla    Whey    Zofran [Ondansetron Hcl] Other (See Comments)    Muscle cramps, leg tingling   Azithromycin Other (See Comments) and Diarrhea   Egg-Derived Products Other (See Comments)    States exacerbates her IBS symptoms    REVIEW OF SYSTEMS:   [X]  denotes positive finding, [ ]  denotes negative finding Cardiac  Comments:  Chest pain or chest pressure:    Shortness of breath upon exertion:    Short of breath when lying flat:    Irregular heart rhythm:        Vascular    Pain in calf, thigh, or hip brought on by ambulation:    Pain in feet at night that wakes you up from your sleep:     Blood clot in your veins:    Leg swelling:         Pulmonary    Oxygen at home:    Productive cough:     Wheezing:          Neurologic    Sudden weakness in arms or legs:     Sudden numbness in arms or legs:     Sudden onset of difficulty speaking or slurred speech:    Temporary loss of vision in one eye:     Problems with dizziness:         Gastrointestinal    Blood in stool:     Vomited blood:         Genitourinary    Burning when urinating:     Blood in urine:        Psychiatric    Major depression:         Hematologic    Bleeding problems:    Problems with blood clotting too easily:        Skin    Rashes or ulcers:        Constitutional    Fever or chills:      PHYSICAL EXAMINATION:  Today's Vitals   07/03/23 1130  BP: 131/74  Pulse: 69  Resp: 16  Temp: 98.4 F (36.9 C)  TempSrc:  Temporal  SpO2: 100%  Weight: 157 lb 4.8 oz (71.4 kg)  Height: 5\' 2"  (1.575 m)  PainSc: 3    Body mass index is 28.77 kg/m.   General:  WDWN in NAD; vital signs documented above Gait: Not observed HENT: WNL, normocephalic Pulmonary: normal non-labored breathing without wheezing Cardiac: regular HR; without carotid bruits Abdomen: soft, NT, aortic pulse is not palpable Skin: without rashes Vascular Exam/Pulses:  Right Left  Radial 2+ (normal) 2+ (normal)  DP 2+ (normal) 2+ (normal)   Extremities: no swelling or skin color changes.  Varicose vein present left lower lateral leg.   Neurologic: A&O X 3;  moving all extremities equally Psychiatric:  The pt has Normal affect.   Non-Invasive Vascular Imaging:   Venous duplex on 07/03/2023: +--------------+---------+------+-----------+------------+--------+  LEFT         Reflux NoRefluxReflux TimeDiameter cmsComments                          Yes                                   +--------------+---------+------+-----------+------------+--------+  CFV          no                                              +--------------+---------+------+-----------+------------+--------+  FV mid        no                                               +--------------+---------+------+-----------+------------+--------+  Popliteal    no                                              +--------------+---------+------+-----------+------------+--------+  GSV at [facility]    no                            0.51              +--------------+---------+------+-----------+------------+--------+  GSV prox thigh          yes    >500 ms      0.33              +--------------+---------+------+-----------+------------+--------+  GSV mid thigh           yes    >500 ms      0.25              +--------------+---------+------+-----------+------------+--------+  GSV dist thighno                            0.27              +--------------+---------+------+-----------+------------+--------+  GSV at knee   no                            0.26              +--------------+---------+------+-----------+------------+--------+  GSV prox calf no                            0.24              +--------------+---------+------+-----------+------------+--------+  SSV Pop Fossa no                            0.15              +--------------+---------+------+-----------+------------+--------+   Summary:  Left:  - No evidence of deep vein thrombosis from the common femoral through the  popliteal veins.  - No evidence of superficial venous thrombosis.  - The deep venous system is competent.  - The great sapehnous vein is not competent.  - The small saphenous vein is competent.     Natalie Erickson is a 72 y.o. female who presents with: varicose vein left leg.     -pt has easily palpable DP pedal pulses bilaterally -pt does not have evidence of DVT.  Pt does have venous reflux in the GSV in the proximal and mid thigh and the vein is small.  She is not a candidate for ablation.   -discussed with pt about continuing to wear her  knee high 15-20 mmHg compression stockings -discussed the importance of leg  elevation and how to elevate properly - pt is advised to elevate their legs and a diagram is given to them to demonstrate for pt to lay flat on their back with knees elevated and slightly bent with their feet higher than their knees, which puts their feet higher than their heart for 15 minutes per day.  -pt is advised to continue to stay active and do as much walking as possible and avoid sitting or standing for long periods of time.  -discussed importance of weight loss and exercise and that water aerobics would also be beneficial.  -handout with recommendations given -pt will f/u as needed.     Doreatha Massed, [facility] Vascular and Vein Specialists [phone]  Clinic MD:  Myra Gianotti

## 2023-07-28 DIAGNOSIS — G4733 Obstructive sleep apnea (adult) (pediatric): Secondary | ICD-10-CM | POA: Diagnosis not present

## 2023-08-08 ENCOUNTER — Encounter: Payer: Self-pay | Admitting: Internal Medicine

## 2023-08-08 ENCOUNTER — Ambulatory Visit: Payer: PPO | Admitting: Internal Medicine

## 2023-08-08 VITALS — BP 118/76 | HR 69 | Ht 62.0 in | Wt 161.0 lb

## 2023-08-08 DIAGNOSIS — E042 Nontoxic multinodular goiter: Secondary | ICD-10-CM

## 2023-08-08 NOTE — Progress Notes (Signed)
Name: Natalie Erickson  MRN/ DOB: [id number], 12/18/1951    Age/ Sex: 72 y.o., female     PCP: Ardith Dark, MD   Reason for Endocrinology Evaluation: MNG     Initial Endocrinology Clinic Visit: 09/10/2019    PATIENT IDENTIFIER: Natalie Erickson is a 72 y.o., female with a past medical history of GAD, fibromyalgia , OSA on CPAP and dyslipidemia. She has followed with [facility] since 09/09/2020 for consultative assistance with management of her MNG.   HISTORICAL SUMMARY:   Pt was found to have an incidental thyroid nodules on MRI 06/2019 during evaluation of a left neck pain with radiculopathy.      An ultrasound 07/2019 confirmed diagnosis of multiple thyroid nodules, with the largest being 3.3 cm at the left middle thyroid lobe . She is S/P benign FNA of the left mid thyroid nodule on 09/17/2019     No FH of thyroid disease   SUBJECTIVE:    Today (08/08/2023):  Natalie Erickson is here for a follow up on MNG.  Patient had a follow-up with vascular and vein specialist for left lower extremity varicosity She had a follow-up with cardiology 04/2023, and able to tolerate 5 mg of rosuvastatin, as well as repatha , currently on pravastatin on alternate days   Weight continues to fluctuate She exercises on regular basis  Continue with occasional  constipation  - uses miralax and magnesium is needed  Continues with rare palpitations  Denies tremors  Denies local neck symptoms  No Biotin   Has fibromyalgia      HISTORY:  Past Medical History:  Past Medical History:  Diagnosis Date   AC (acromioclavicular) joint bone spurs    c 5 and c6 and thoracic   Allergy    food allergies   Anxiety 04/06/2017   Benign thyroid cyst sept 2020 dx   Cataract    bilateral   Cystitis    Depression    Dry eyes 04/06/2017   Elevated BP without diagnosis of hypertension 04/06/2017   lost weight >> BP improved   Fibromyalgia    Floaters    both eyes   Heart murmur    hx  of 2 Echocardiograms in [place] (pt was told they were normal)   Hiatal hernia    Hip pain    and groin pain for a while   History of colon polyps    History of echocardiogram    Echo 9/19: mild LVH, EF 60-65, no RWMA, Gr 1 DD, trivial MR/TR, PASP 16   History of kidney stones    History of Lyme disease    Hypercalcemia    causes kidney stones, none recent   IBS (irritable bowel syndrome)    Mixed hyperlipidemia 04/06/2017   intol to statins due to myalgias; never tried Zetia   Occipital neuralgia    Sleep apnea    mild to moderate , uses cpap   TMJ (dislocation of temporomandibular joint)    UTI (urinary tract infection) finishes antibiotic 03-27-2020   Vasomotor rhinitis    Past Surgical History:  Past Surgical History:  Procedure Laterality Date   APPENDECTOMY  1959   COLONOSCOPY  2016   [place]   COLONOSCOPY  2011   in [place]    EVALUATION UNDER ANESTHESIA WITH HEMORRHOIDECTOMY N/A 04/02/2020   Procedure: ANORECTAL EXAM UNDER ANESTHESIA WITH HEMORRHOIDECTOMY, HEMORRHOIDAL LIGATION;  Surgeon: Karie Soda, MD;  Location: [facility];  Service: General;  Laterality: N/A;  GENERAL AND LOCAL   left ovarin cyst removed  age 50's   grapefruit sized, exp laparotomy   POLYPECTOMY     UPPER GASTROINTESTINAL ENDOSCOPY     UPPER GI ENDOSCOPY     Social History:  reports that she has never smoked. She has never used smokeless tobacco. She reports that she does not drink alcohol and does not use drugs. Family History:  Family History  Problem Relation Age of Onset   Healthy Mother    Hypertension Mother    Healthy Father    Lung cancer Father    Breast cancer Sister    Diabetes Paternal Grandmother    Colon cancer Neg Hx    Stomach cancer Neg Hx    Heart attack Neg Hx    Heart failure Neg Hx    Pancreatic cancer Neg Hx    Colon polyps Neg Hx    Esophageal cancer Neg Hx    Rectal cancer Neg Hx      HOME MEDICATIONS: Allergies as of 08/08/2023        Reactions   Diltiazem Shortness Of Breath   Metoprolol Shortness Of Breath   Statins Tinitus   Achy joints, muscle aches Achy joints, muscle aches   Chocolate Other (See Comments)   migraine   Cocoa Other (See Comments)   migraine migraine   Codeine Nausea And Vomiting   Cranberry Other (See Comments)   Cystitis   Latex Rash   Monosodium Glutamate Other (See Comments)   Migraine   Penicillins Rash   Has patient had a PCN reaction causing immediate rash, facial/tongue/throat swelling, SOB or lightheadedness with hypotension: Yes Has patient had a PCN reaction causing severe rash involving mucus membranes or skin necrosis: No Has patient had a PCN reaction that required hospitalization No Has patient had a PCN reaction occurring within the last 10 years: No If all of the above answers are "NO", then may proceed with Cephalosporin use.   Antihistamines, Loratadine-type    Throat swelling   Carvedilol Other (See Comments)   N&N, SOB , Head ache and joint pain.    Ginger    Gluten Meal    Lexapro [escitalopram]    GI    Montelukast Other (See Comments)   Throat swelling   Other Swelling   Throat swelling   Prednisone Swelling   Throat swelling (no difficulty breathing)   Repatha [evolocumab] Other (See Comments)   Worsening IBS, knee pain   Sulfites Other (See Comments)   Nausea and headaches   Vanilla    Whey    Zofran [ondansetron Hcl] Other (See Comments)   Muscle cramps, leg tingling   Azithromycin Other (See Comments), Diarrhea   Egg-derived Products Other (See Comments)   States exacerbates her IBS symptoms        Medication List        Accurate as of August 08, 2023 10:48 AM. If you have any questions, ask your nurse or doctor.          STOP taking these medications    doxycycline 100 MG tablet Commonly known as: VIBRA-TABS Stopped by: Johnney Ou Kasean Denherder       TAKE these medications    acetaminophen 500 MG tablet Commonly known as:  TYLENOL Take 1,000 mg by mouth every 6 (six) hours as needed for headache (pain).   AMBULATORY NON FORMULARY MEDICATION Take 1 tablet by mouth as needed. Medication Name: Currie Paris   co-enzyme Q-10 30 MG capsule Take 100 mg  by mouth daily.   Magnesium Glycinate Powd 120 mg by Does not apply route as needed.   omega-3 acid ethyl esters 1 g capsule Commonly known as: LOVAZA Take 1,000 mg by mouth 2 (two) times daily.   polyethylene glycol powder 17 GM/SCOOP powder Commonly known as: GLYCOLAX/MIRALAX Take 17-34 g by mouth as needed.   pravastatin 10 MG tablet Commonly known as: PRAVACHOL Take 1 tablet (10 mg total) by mouth daily.   SYSTANE ULTRA OP Apply to eye at bedtime.   Vitamin D 50 MCG (2000 UT) tablet Take 2,000 Units by mouth daily.         OBJECTIVE:   PHYSICAL EXAM: VS: BP 118/76 (BP Location: Left Arm, Patient Position: Sitting, Cuff Size: Small)   Pulse 69   Ht 5\' 2"  (1.575 m)   Wt 161 lb (73 kg)   SpO2 99%   BMI 29.45 kg/m    EXAM: General: Pt appears well and is in NAD  Neck: General: Supple without adenopathy. Thyroid: Thyroid size normal.  Fullness palpated on the left   Lungs: Clear with good BS bilat  Heart: Auscultation: RRR.  Extremities:  BL LE: No pretibial edema   Mental Status: Judgment, insight: Intact Orientation: Oriented to time, place, and person Mood and affect: No depression, anxiety, or agitation     DATA REVIEWED:  Latest Reference Range & Units 02/13/23 08:39  TSH 0.35 - 5.50 uIU/mL 0.57  Triiodothyronine,Free,Serum 2.3 - 4.2 pg/mL 3.0  T4,Free(Direct) 0.60 - 1.60 ng/dL 1.61  Thyroglobulin Ab < or = 1 IU/mL <1  Thyroperoxidase Ab SerPl-aCnc <9 IU/mL <1    Thyroid Ultrasound 11/03/2022    Estimated total number of nodules >/= 1 cm: 3   Number of spongiform nodules >/=  2 cm not described below (TR1): 0   Number of mixed cystic and solid nodules >/= 1.5 cm not described below (TR2): 0    _________________________________________________________   Nodule # 1:   Location: Isthmus; Inferior   Maximum size: 1.0 cm; Other 2 dimensions: 0.9 x 0.3 cm   Composition: solid/almost completely solid (2)   Echogenicity: hypoechoic (2)   Shape: not taller-than-wide (0)   Margins: smooth (0)   Echogenic foci: none (0)   ACR TI-RADS total points: 4.   ACR TI-RADS risk category: TR4 (4-6 points).   ACR TI-RADS recommendations:   *Given size (>/= 1 - 1.4 cm) and appearance, a follow-up ultrasound in 1 year should be considered based on TI-RADS criteria.   _________________________________________________________   Nodule # 2: The previously biopsied nodule in the left mid gland measures slightly enlarged at 3.8 x 2.5 x 1.8 cm compared to 3.3 x 2.2 x 2.1 cm previously.   Nodule # 3: Small mixed cystic and solid nodule in the left lower gland noted incidentally. No further follow-up recommended.   IMPRESSION: 1. The previously biopsied nodule in the left mid gland measures slightly larger at 3.8 x 2.5 x 1.8 cm. Recommend correlation with prior biopsy results. 2. Slight interval enlargement of TI-RADS category 4 nodule in the thyroid isthmus which now meets criteria for imaging surveillance. Recommend follow-up ultrasound in 1 year.      FNA 09/17/2019 Clinical History: Left mid 3.3cm; Other 2 dimensions: 2.3 x 1.7cm, Solid  DIAGNOSIS:  - Consistent with benign follicular nodule (Bethesda category II)   ASSESSMENT / PLAN / RECOMMENDATIONS:   Multinodular Goiter:  - Pt is clinically euthyroid - No local neck symptoms  - S/P benign FNA of the  left mid nodule (08/2019) -Will proceed with repeat ultrasound for this year   F/U in 1 yr    Signed electronically by: Lyndle Herrlich, MD  [facility]  [facility] [address] [place] [place], [place] [address] Phone: [phone] FAX: [phone]      CC: Ardith Dark, MD [address] [place] [address] Phone: [phone]  Fax: [phone]   Return to Endocrinology clinic as below: Future Appointments  Date Time Provider Department Center  09/29/2023 10:00 AM Hilty, Lisette Abu, MD [facility] None  01/03/2024 10:00 AM Lomax, Amy, NP [facility] None

## 2023-08-10 ENCOUNTER — Other Ambulatory Visit: Payer: Self-pay | Admitting: Internal Medicine

## 2023-09-21 DIAGNOSIS — M5442 Lumbago with sciatica, left side: Secondary | ICD-10-CM | POA: Diagnosis not present

## 2023-09-21 DIAGNOSIS — M9905 Segmental and somatic dysfunction of pelvic region: Secondary | ICD-10-CM | POA: Diagnosis not present

## 2023-09-21 DIAGNOSIS — M9903 Segmental and somatic dysfunction of lumbar region: Secondary | ICD-10-CM | POA: Diagnosis not present

## 2023-09-21 DIAGNOSIS — M9902 Segmental and somatic dysfunction of thoracic region: Secondary | ICD-10-CM | POA: Diagnosis not present

## 2023-09-22 DIAGNOSIS — E7841 Elevated Lipoprotein(a): Secondary | ICD-10-CM | POA: Diagnosis not present

## 2023-09-22 DIAGNOSIS — E782 Mixed hyperlipidemia: Secondary | ICD-10-CM | POA: Diagnosis not present

## 2023-09-23 LAB — LIPID PANEL
Chol/HDL Ratio: 3.5 {ratio} (ref 0.0–4.4)
Cholesterol, Total: 207 mg/dL — ABNORMAL HIGH (ref 100–199)
HDL: 59 mg/dL (ref >39)
LDL Chol Calc (NIH): 129 mg/dL — ABNORMAL HIGH (ref 0–99)
Triglycerides: 107 mg/dL (ref 0–149)
VLDL Cholesterol Cal: 19 mg/dL (ref 5–40)

## 2023-09-25 DIAGNOSIS — H353131 Nonexudative age-related macular degeneration, bilateral, early dry stage: Secondary | ICD-10-CM | POA: Diagnosis not present

## 2023-09-25 DIAGNOSIS — H25813 Combined forms of age-related cataract, bilateral: Secondary | ICD-10-CM | POA: Diagnosis not present

## 2023-09-25 DIAGNOSIS — H16223 Keratoconjunctivitis sicca, not specified as Sjogren's, bilateral: Secondary | ICD-10-CM | POA: Diagnosis not present

## 2023-09-25 DIAGNOSIS — D3132 Benign neoplasm of left choroid: Secondary | ICD-10-CM | POA: Diagnosis not present

## 2023-09-25 DIAGNOSIS — H524 Presbyopia: Secondary | ICD-10-CM | POA: Diagnosis not present

## 2023-09-25 DIAGNOSIS — H04123 Dry eye syndrome of bilateral lacrimal glands: Secondary | ICD-10-CM | POA: Diagnosis not present

## 2023-09-28 DIAGNOSIS — M9905 Segmental and somatic dysfunction of pelvic region: Secondary | ICD-10-CM | POA: Diagnosis not present

## 2023-09-28 DIAGNOSIS — M9903 Segmental and somatic dysfunction of lumbar region: Secondary | ICD-10-CM | POA: Diagnosis not present

## 2023-09-28 DIAGNOSIS — M5442 Lumbago with sciatica, left side: Secondary | ICD-10-CM | POA: Diagnosis not present

## 2023-09-28 DIAGNOSIS — M9902 Segmental and somatic dysfunction of thoracic region: Secondary | ICD-10-CM | POA: Diagnosis not present

## 2023-09-29 ENCOUNTER — Encounter: Payer: Self-pay | Admitting: Internal Medicine

## 2023-09-29 ENCOUNTER — Ambulatory Visit: Payer: PPO | Attending: Internal Medicine | Admitting: Internal Medicine

## 2023-09-29 VITALS — BP 124/84 | HR 68 | Ht 62.0 in | Wt 161.2 lb

## 2023-09-29 DIAGNOSIS — M791 Myalgia, unspecified site: Secondary | ICD-10-CM

## 2023-09-29 DIAGNOSIS — E7841 Elevated Lipoprotein(a): Secondary | ICD-10-CM

## 2023-09-29 DIAGNOSIS — I700 Atherosclerosis of aorta: Secondary | ICD-10-CM

## 2023-09-29 DIAGNOSIS — T466X5D Adverse effect of antihyperlipidemic and antiarteriosclerotic drugs, subsequent encounter: Secondary | ICD-10-CM | POA: Diagnosis not present

## 2023-09-29 DIAGNOSIS — E782 Mixed hyperlipidemia: Secondary | ICD-10-CM

## 2023-09-29 MED ORDER — PITAVASTATIN CALCIUM 2 MG PO TABS: 2.0000 mg | ORAL_TABLET | Freq: Every day | ORAL | 3 refills | Status: DC

## 2023-09-29 NOTE — Progress Notes (Signed)
LIPID CLINIC CONSULT NOTE  Chief Complaint:  Follow-up dyslipidemia, palpitations  Primary Care Physician: Ardith Dark, MD  Primary Cardiologist:  Parke Poisson, MD  HPI:  Natalie Erickson is a 72 y.o. female who is being seen today for the evaluation of dyslipidemia at the request of Ardith Dark, MD. This is a very pleasant 72 year old female kindly referred by her primary care provider for evaluation management of dyslipidemia.  She has been previously seen in our office by Dr. Alveta Heimlich as well as more recently by Blima Rich, PA-C.  She has a history of statin intolerance and was referred for evaluation management of her cholesterol.  She has been trying to manage this with diet and activity and more recently has had a reduction in her LDL cholesterol from 1 87 -> 150, total cholesterol 233, triglycerides 106 and HDL 61.  She has had prior studies including CT scan of the abdomen and pelvis which showed aortic atherosclerosis.  There is some heart disease remotely in her family.  She is a retired Engineer, civil (consulting) and has done reiki therapy and other things.  She has a concern about statins as far as potential side effects.  She did seem amenable to trying red yeast rice today.  She also wanted to look further in her lipid profile with an NMR which I am certainly happy to obtain however her insurance is not likely to pay for that unless we wait several months.  Since she is interested in trying over-the-counter therapy, would make sense to try to get this information in a few months on treatment.  She reports her diet is fairly low in saturated fats in fact because of issues with IBS and fibromyalgia, she has severely limited her diet.  09/09/2022  Natalie Erickson returns today for follow-up.  She underwent a lipid NMR which shows elevated cholesterol.  Her LDL particle number is 1776, LDL-C of 154, HDL-C64 and triglycerides 75.  Her LP(a) also was significantly elevated at 281.4 nmol/L.  She reports  that her sister also has a very high LP(a) and is on Repatha.  She could also not tolerate statins.  Natalie Erickson has not been able to tolerate statins and recently tried red yeast rice which also caused her significant myalgias.  As noted above she has ASCVD with imaging evidence of aortic atherosclerosis.  01/11/2023  Natalie Erickson is seen today in follow-up. She reports that after taking Repatha for 8 weeks, she developed worsening IBS symptoms  and knee pain.  She reports stopping the Repatha but has not had total improvement in the symptoms however they are better.  She is not interested in the medication anymore.  She did have an improvement in her LP(a) with a decrease from 281 to 183 nmol/L.  Minimal improvement in her LDL particle numbers from 17 76-15 72 with an LDL of 140 down from 154.  Options are quite limited.  We discussed the possibility of Leqvio but because of the long half-life of the medication she is not interested due to side effect concerns.  She did say that she was interested in a statin.  She wanted to try the lowest dose of pravastatin to try to work up as it was tolerated.  I explained to her that statin side effects are dose-dependent and typically we like to choose the dose of the statin that would be most effective for lipid lowering.  I advised that we wanted to try to a low-dose statin we  could consider low-dose rosuvastatin which is associated with about a 35% cholesterol reduction compared to about 10 to 15% with 10 mg of pravastatin.  05/11/2023  Natalie Erickson returns for follow-up. Unfortunately, she was unable to tolerate 5 mg of rosuvastatin.  She did have repeat lipids which show persistently elevated LDL cholesterol at 160 with particle #[id number].  We discussed the very few options she may have for additional therapy but she again mentioned that she wanted to consider trying 10 mg of pravastatin, and a low-dose of medication.  He did report that recently she had had some palpitations  although was diagnosed with sleep apnea and is on CPAP with complete resolution of them.  09/29/2023  Natalie Erickson returns today for follow-up.  Unfortunately she could not tolerate pravastatin.  She is also been intolerant of atorvastatin, rosuvastatin and simvastatin in the past.  She brought in a list of additional statin she has not tried today.  She could not take Repatha.  We discussed bempedoic acid and ezetimibe which have also not been trialed however she does have some other medical conditions that may preclude this.  She is hesitant to try them for that reason.  She would prefer to try another statin.  That being said, with diet and exercise she has managed to lower her cholesterol further.  Total now 207 (down from 247), triglycerides 107, HDL 59 and LDL 129.  PMHx:  Past Medical History:  Diagnosis Date   AC (acromioclavicular) joint bone spurs    c 5 and c6 and thoracic   Allergy    food allergies   Anxiety 04/06/2017   Benign thyroid cyst sept 2020 dx   Cataract    bilateral   Cystitis    Depression    Dry eyes 04/06/2017   Elevated BP without diagnosis of hypertension 04/06/2017   lost weight >> BP improved   Fibromyalgia    Floaters    both eyes   Heart murmur    hx of 2 Echocardiograms in [place] (pt was told they were normal)   Hiatal hernia    Hip pain    and groin pain for a while   History of colon polyps    History of echocardiogram    Echo 9/19: mild LVH, EF 60-65, no RWMA, Gr 1 DD, trivial MR/TR, PASP 16   History of kidney stones    History of Lyme disease    Hypercalcemia    causes kidney stones, none recent   IBS (irritable bowel syndrome)    Mixed hyperlipidemia 04/06/2017   intol to statins due to myalgias; never tried Zetia   Occipital neuralgia    Sleep apnea    mild to moderate , uses cpap   TMJ (dislocation of temporomandibular joint)    UTI (urinary tract infection) finishes antibiotic 03-27-2020   Vasomotor rhinitis     Past Surgical  History:  Procedure Laterality Date   APPENDECTOMY  1959   COLONOSCOPY  2016   [place]   COLONOSCOPY  2011   in [place]    EVALUATION UNDER ANESTHESIA WITH HEMORRHOIDECTOMY N/A 04/02/2020   Procedure: ANORECTAL EXAM UNDER ANESTHESIA WITH HEMORRHOIDECTOMY, HEMORRHOIDAL LIGATION;  Surgeon: Karie Soda, MD;  Location: [facility];  Service: General;  Laterality: N/A;  GENERAL AND LOCAL   left ovarin cyst removed  age 38's   grapefruit sized, exp laparotomy   POLYPECTOMY     UPPER GASTROINTESTINAL ENDOSCOPY     UPPER GI ENDOSCOPY  FAMHx:  Family History  Problem Relation Age of Onset   Healthy Mother    Hypertension Mother    Healthy Father    Lung cancer Father    Breast cancer Sister    Diabetes Paternal Grandmother    Colon cancer Neg Hx    Stomach cancer Neg Hx    Heart attack Neg Hx    Heart failure Neg Hx    Pancreatic cancer Neg Hx    Colon polyps Neg Hx    Esophageal cancer Neg Hx    Rectal cancer Neg Hx     SOCHx:   reports that she has never smoked. She has never used smokeless tobacco. She reports that she does not drink alcohol and does not use drugs.  ALLERGIES:  Allergies  Allergen Reactions   Diltiazem Shortness Of Breath   Metoprolol Shortness Of Breath   Statins Tinitus    Achy joints, muscle aches, nerve pain   Chocolate Other (See Comments)    migraine   Cocoa Other (See Comments)    migraine migraine   Codeine Nausea And Vomiting   Cranberry Other (See Comments)    Cystitis   Latex Rash   Monosodium Glutamate Other (See Comments)    Migraine   Penicillins Rash    Has patient had a PCN reaction causing immediate rash, facial/tongue/throat swelling, SOB or lightheadedness with hypotension: Yes Has patient had a PCN reaction causing severe rash involving mucus membranes or skin necrosis: No Has patient had a PCN reaction that required hospitalization No Has patient had a PCN reaction occurring within the last 10  years: No If all of the above answers are "NO", then may proceed with Cephalosporin use.    Antihistamines, Loratadine-Type     Throat swelling   Carvedilol Other (See Comments)    N&N, SOB , Head ache and joint pain.    Ginger    Gluten Meal    Lexapro [Escitalopram]     GI    Montelukast Other (See Comments)    Throat swelling   Other Swelling    Throat swelling   Prednisone Swelling    Throat swelling (no difficulty breathing)   Repatha [Evolocumab] Other (See Comments)    Worsening IBS, knee pain   Sulfites Other (See Comments)    Nausea and headaches   Vanilla    Whey    Zofran [Ondansetron Hcl] Other (See Comments)    Muscle cramps, leg tingling   Azithromycin Other (See Comments) and Diarrhea   Egg-Derived Products Other (See Comments)    States exacerbates her IBS symptoms    ROS: Pertinent items noted in HPI and remainder of comprehensive ROS otherwise negative.  HOME MEDS: Current Outpatient Medications on File Prior to Visit  Medication Sig Dispense Refill   acetaminophen (TYLENOL) 500 MG tablet Take 1,000 mg by mouth every 6 (six) hours as needed for headache (pain).     AMBULATORY NON FORMULARY MEDICATION Take 1 tablet by mouth as needed. Medication Name: Currie Paris     Cholecalciferol (VITAMIN D) 2000 units tablet Take 2,000 Units by mouth daily.      co-enzyme Q-10 30 MG capsule Take 100 mg by mouth daily.     Magnesium Glycinate POWD 120 mg by Does not apply route as needed.      Multiple Vitamins-Minerals (PRESERVISION AREDS PO) Take 1 tablet by mouth daily.     omega-3 acid ethyl esters (LOVAZA) 1 g capsule Take 1,000 mg by mouth 2 (two) times  daily.     Polyethyl Glycol-Propyl Glycol (SYSTANE ULTRA OP) Apply to eye at bedtime.      polyethylene glycol powder (GLYCOLAX/MIRALAX) 17 GM/SCOOP powder Take 17-34 g by mouth as needed.     No current facility-administered medications on file prior to visit.    LABS/IMAGING: No results found for this or  any previous visit (from the past 48 hour(s)). No results found.  LIPID PANEL:    Component Value Date/Time   CHOL 207 (H) 09/22/2023 0819   TRIG 107 09/22/2023 0819   HDL 59 09/22/2023 0819   CHOLHDL 3.5 09/22/2023 0819   CHOLHDL 3.2 02/13/2023 0839   VLDL 21.2 05/06/2022 0834   LDLCALC 129 (H) 09/22/2023 0819   LDLCALC 107 (H) 02/13/2023 0839    WEIGHTS: Wt Readings from Last 3 Encounters:  09/29/23 161 lb 3.2 oz (73.1 kg)  08/08/23 161 lb (73 kg)  07/03/23 157 lb 4.8 oz (71.4 kg)    VITALS: BP 124/84   Pulse 68   Ht 5\' 2"  (1.575 m)   Wt 161 lb 3.2 oz (73.1 kg)   SpO2 97%   BMI 29.48 kg/m   EXAM: Deferred  EKG: Deferred  ASSESSMENT: Mixed dyslipidemia ASCVD with aortic atherosclerosis Statin intolerance-myalgias Repatha intolerance Fibromyalgia IBS-C Palpitations OSA on CPAP Elevated LP(a) at 281.4 nmol/L  PLAN: 1.   Natalie Erickson has had further reduction in cholesterol but not on therapy.  She cannot tolerate pravastatin.  She has not been intolerant to 4 different statins and Repatha but is hesitant to take ezetimibe or bempedoic acid because of their side effect profile.  She wishes to try another statin.  Would recommend Livalo 2 mg daily.  Plan repeat lipids in about 3 to 4 months.  He is noted to have an elevated LP(a).  There is an upcoming clinical trial which she may qualify for and will hold onto her name for that.  Chrystie Nose, MD, [facility], FACP  [place]  [facility]  Medical Director of the Advanced Lipid Disorders &  Cardiovascular Risk Reduction Clinic Diplomate of the American Board of Clinical Lipidology Attending Cardiologist  Direct Dial: [phone]  Fax: [phone]  Website:  [email or web address] Nicely Lahari Suttles 09/29/2023, 10:24 AM

## 2023-09-29 NOTE — Patient Instructions (Signed)
Medication Instructions:  START pitavastatin (livalo) 2mg  daily  *If you need a refill on your cardiac medications before your next appointment, please call your pharmacy*   Lab Work: FASTING lab work in 3-4 months  If you have labs (blood work) drawn today and your tests are completely normal, you will receive your results only by: [employer] (if you have MyChart) OR A paper copy in the mail If you have any lab test that is abnormal or we need to change your treatment, we will call you to review the results.    Follow-Up: At [facility], you and your health needs are our priority.  As part of our continuing mission to provide you with exceptional heart care, we have created designated Provider Care Teams.  These Care Teams include your primary Cardiologist (physician) and Advanced Practice Providers (APPs -  Physician Assistants and Nurse Practitioners) who all work together to provide you with the care you need, when you need it.  We recommend signing up for the patient portal called "MyChart".  Sign up information is provided on this After Visit Summary.  MyChart is used to connect with patients for Virtual Visits (Telemedicine).  Patients are able to view lab/test results, encounter notes, upcoming appointments, etc.  Non-urgent messages can be sent to your provider as well.   To learn more about what you can do with MyChart, go to [email or web address].    Your next appointment:   3-4 months with Dr. Rennis Golden -- lipid clinic

## 2023-10-02 ENCOUNTER — Other Ambulatory Visit: Payer: Self-pay | Admitting: Internal Medicine

## 2023-10-02 MED ORDER — ZYPITAMAG 1 MG PO TABS: 1.0000 mg | ORAL_TABLET | Freq: Every day | ORAL | 11 refills | Status: DC

## 2023-10-02 NOTE — Telephone Encounter (Signed)
Spoke with patient. Livalo is costly at the retail pharmacy. She is interested in using [employer] [facility].   She is also asking if she can start off with 1mg  of pitavastatin -- advised will defer to MD.

## 2023-10-02 NOTE — Telephone Encounter (Signed)
Pt c/o medication issue:  1. Name of Medication: Pitavastatin Calcium 2 MG TABS   2. How are you currently taking this medication (dosage and times per day)?    3. Are you having a reaction (difficulty breathing--STAT)? no  4. What is your medication issue? Patient calling about the medication be sent through the mail. Stated the dr told he it would be cheaper. Please advise

## 2023-10-02 NOTE — Telephone Encounter (Signed)
Patient aware MD has Rx'ed 1mg  (per request). Rx(s) sent to pharmacy electronically.

## 2023-10-02 NOTE — Telephone Encounter (Signed)
Natalie Nose, MD  Lindell Spar, RN Caller: Unspecified (Today,  2:26 PM) Ok .Marland Kitchen Change it to 1 mg from [place]  [place]

## 2023-10-06 DIAGNOSIS — M9903 Segmental and somatic dysfunction of lumbar region: Secondary | ICD-10-CM | POA: Diagnosis not present

## 2023-10-06 DIAGNOSIS — M9905 Segmental and somatic dysfunction of pelvic region: Secondary | ICD-10-CM | POA: Diagnosis not present

## 2023-10-06 DIAGNOSIS — M9902 Segmental and somatic dysfunction of thoracic region: Secondary | ICD-10-CM | POA: Diagnosis not present

## 2023-10-06 DIAGNOSIS — M5442 Lumbago with sciatica, left side: Secondary | ICD-10-CM | POA: Diagnosis not present

## 2023-10-11 ENCOUNTER — Ambulatory Visit: Payer: PPO | Admitting: Physician Assistant

## 2023-10-11 ENCOUNTER — Encounter: Payer: Self-pay | Admitting: Physician Assistant

## 2023-10-11 VITALS — BP 118/70 | HR 71 | Temp 97.5°F | Ht 62.0 in | Wt 161.2 lb

## 2023-10-11 DIAGNOSIS — R300 Dysuria: Secondary | ICD-10-CM

## 2023-10-11 LAB — POCT URINALYSIS DIPSTICK
Bilirubin, UA: NEGATIVE
Blood, UA: 1
Glucose, UA: NEGATIVE
Ketones, UA: NEGATIVE
Leukocytes, UA: NEGATIVE
Nitrite, UA: NEGATIVE
Protein, UA: NEGATIVE
Spec Grav, UA: 1.02 (ref 1.010–1.025)
Urobilinogen, UA: 0.2 U/dL
pH, UA: 5.5 (ref 5.0–8.0)

## 2023-10-11 LAB — URINALYSIS, ROUTINE W REFLEX MICROSCOPIC
Bilirubin Urine: NEGATIVE
Ketones, ur: NEGATIVE
Nitrite: NEGATIVE
Specific Gravity, Urine: 1.02 (ref 1.000–1.030)
Total Protein, Urine: NEGATIVE
Urine Glucose: NEGATIVE
Urobilinogen, UA: 0.2 (ref 0.0–1.0)
pH: 6 (ref 5.0–8.0)

## 2023-10-11 MED ORDER — CIPROFLOXACIN HCL 250 MG PO TABS: 250.0000 mg | ORAL_TABLET | Freq: Two times a day (BID) | ORAL | 0 refills | Status: AC

## 2023-10-11 NOTE — Progress Notes (Signed)
Natalie Erickson is a 72 y.o. female here for a new problem.  History of Present Illness:   Chief Complaint  Patient presents with   Dysuria    Pt c/o burning with urination and nocturia for the past week. Having low back pain.    HPI  Urinary tract infection symptom(s)  Reports having dysuria, nocturia for 1.5 weeks. She has a history of UTI and symptoms are similar although she notes increased body aches this time. Believes body aches may be related to arthritis. Urinalysis today showed blood in urine. History of interstitial cystitis in 2021 Took AZO once for this illness Taking magnesium, Tylenol. She is hydrating well. Denies flank pain, respiratory symptoms. Interested in doing PT for pelvic floor strengthening.    Past Medical History:  Diagnosis Date   AC (acromioclavicular) joint bone spurs    c 5 and c6 and thoracic   Allergy    food allergies   Anxiety 04/06/2017   Benign thyroid cyst sept 2020 dx   Cataract    bilateral   Cystitis    Depression    Dry eyes 04/06/2017   Elevated BP without diagnosis of hypertension 04/06/2017   lost weight >> BP improved   Fibromyalgia    Floaters    both eyes   Heart murmur    hx of 2 Echocardiograms in [place] (pt was told they were normal)   Hiatal hernia    Hip pain    and groin pain for a while   History of colon polyps    History of echocardiogram    Echo 9/19: mild LVH, EF 60-65, no RWMA, Gr 1 DD, trivial MR/TR, PASP 16   History of kidney stones    History of Lyme disease    Hypercalcemia    causes kidney stones, none recent   IBS (irritable bowel syndrome)    Mixed hyperlipidemia 04/06/2017   intol to statins due to myalgias; never tried Zetia   Occipital neuralgia    Sleep apnea    mild to moderate , uses cpap   TMJ (dislocation of temporomandibular joint)    UTI (urinary tract infection) finishes antibiotic 03-27-2020   Vasomotor rhinitis      Social History   Tobacco Use   Smoking status:  Never   Smokeless tobacco: Never  Vaping Use   Vaping status: Never Used  Substance Use Topics   Alcohol use: No   Drug use: No    Past Surgical History:  Procedure Laterality Date   APPENDECTOMY  1959   COLONOSCOPY  2016   [place]   COLONOSCOPY  2011   in [place]    EVALUATION UNDER ANESTHESIA WITH HEMORRHOIDECTOMY N/A 04/02/2020   Procedure: ANORECTAL EXAM UNDER ANESTHESIA WITH HEMORRHOIDECTOMY, HEMORRHOIDAL LIGATION;  Surgeon: Karie Soda, MD;  Location: [facility];  Service: General;  Laterality: N/A;  GENERAL AND LOCAL   left ovarin cyst removed  age 72's   grapefruit sized, exp laparotomy   POLYPECTOMY     UPPER GASTROINTESTINAL ENDOSCOPY     UPPER GI ENDOSCOPY      Family History  Problem Relation Age of Onset   Healthy Mother    Hypertension Mother    Healthy Father    Lung cancer Father    Breast cancer Sister    Diabetes Paternal Grandmother    Colon cancer Neg Hx    Stomach cancer Neg Hx    Heart attack Neg Hx    Heart failure Neg Hx  Pancreatic cancer Neg Hx    Colon polyps Neg Hx    Esophageal cancer Neg Hx    Rectal cancer Neg Hx     Allergies  Allergen Reactions   Diltiazem Shortness Of Breath   Metoprolol Shortness Of Breath   Statins Tinitus    Achy joints, muscle aches, nerve pain   Chocolate Other (See Comments)    migraine   Cocoa Other (See Comments)    migraine migraine   Codeine Nausea And Vomiting   Cranberry Other (See Comments)    Cystitis   Latex Rash   Monosodium Glutamate Other (See Comments)    Migraine   Penicillins Rash    Has patient had a PCN reaction causing immediate rash, facial/tongue/throat swelling, SOB or lightheadedness with hypotension: Yes Has patient had a PCN reaction causing severe rash involving mucus membranes or skin necrosis: No Has patient had a PCN reaction that required hospitalization No Has patient had a PCN reaction occurring within the last 10 years: No If all of the  above answers are "NO", then may proceed with Cephalosporin use.    Antihistamines, Loratadine-Type     Throat swelling   Carvedilol Other (See Comments)    N&N, SOB , Head ache and joint pain.    Ginger    Gluten Meal    Lexapro [Escitalopram]     GI    Montelukast Other (See Comments)    Throat swelling   Other Swelling    Throat swelling   Prednisone Swelling    Throat swelling (no difficulty breathing)   Repatha [Evolocumab] Other (See Comments)    Worsening IBS, knee pain   Sulfites Other (See Comments)    Nausea and headaches   Vanilla    Whey    Zofran [Ondansetron Hcl] Other (See Comments)    Muscle cramps, leg tingling   Azithromycin Other (See Comments) and Diarrhea   Egg-Derived Products Other (See Comments)    States exacerbates her IBS symptoms    Current Medications:   Current Outpatient Medications:    acetaminophen (TYLENOL) 500 MG tablet, Take 1,000 mg by mouth every 6 (six) hours as needed for headache (pain)., Disp: , Rfl:    AMBULATORY NON FORMULARY MEDICATION, Take 1 tablet by mouth as needed. Medication Name: [facility], Disp: , Rfl:    Cholecalciferol (VITAMIN D) 2000 units tablet, Take 2,000 Units by mouth daily. , Disp: , Rfl:    co-enzyme Q-10 30 MG capsule, Take 100 mg by mouth daily., Disp: , Rfl:    Magnesium Glycinate POWD, 120 mg by Does not apply route as needed. , Disp: , Rfl:    Multiple Vitamins-Minerals (PRESERVISION AREDS PO), Take 1 tablet by mouth daily., Disp: , Rfl:    omega-3 acid ethyl esters (LOVAZA) 1 g capsule, Take 1,000 mg by mouth 2 (two) times daily., Disp: , Rfl:    Pitavastatin Magnesium (ZYPITAMAG) 1 MG TABS, Take 1 mg by mouth daily., Disp: 30 tablet, Rfl: 11   Polyethyl Glycol-Propyl Glycol (SYSTANE ULTRA OP), Apply to eye at bedtime. , Disp: , Rfl:    polyethylene glycol powder (GLYCOLAX/MIRALAX) 17 GM/SCOOP powder, Take 17-34 g by mouth as needed., Disp: , Rfl:    Review of Systems:   Review of Systems   Constitutional:  Negative for fever and malaise/fatigue.  HENT:  Negative for congestion.   Eyes:  Negative for blurred vision.  Respiratory:  Negative for cough and shortness of breath.   Cardiovascular:  Negative for chest pain,  palpitations and leg swelling.  Gastrointestinal:  Positive for constipation. Negative for vomiting.  Genitourinary:  Positive for dysuria and frequency (Nocturia).  Musculoskeletal:  Positive for myalgias (Aches). Negative for back pain.  Skin:  Negative for rash.  Neurological:  Negative for loss of consciousness and headaches.    Vitals:   Vitals:   10/11/23 1133  BP: 118/70  Pulse: 71  Temp: (!) 97.5 F (36.4 C)  TempSrc: Temporal  SpO2: 99%  Weight: 161 lb 4 oz (73.1 kg)  Height: 5\' 2"  (1.575 m)     Body mass index is 29.49 kg/m.  Physical Exam:   Physical Exam Vitals and nursing note reviewed.  Constitutional:      General: She is not in acute distress.    Appearance: She is well-developed. She is not ill-appearing or toxic-appearing.  Cardiovascular:     Rate and Rhythm: Normal rate and regular rhythm.     Pulses: Normal pulses.     Heart sounds: Normal heart sounds, S1 normal and S2 normal.  Pulmonary:     Effort: Pulmonary effort is normal.     Breath sounds: Normal breath sounds.  Abdominal:     Tenderness: There is no right CVA tenderness or left CVA tenderness.  Skin:    General: Skin is warm and dry.  Neurological:     Mental Status: She is alert.     GCS: GCS eye subscore is 4. GCS verbal subscore is 5. GCS motor subscore is 6.  Psychiatric:        Speech: Speech normal.        Behavior: Behavior normal. Behavior is cooperative.    Results for orders placed or performed in visit on 10/11/23  POCT urinalysis dipstick  Result Value Ref Range   Color, UA amber    Clarity, UA clear    Glucose, UA Negative Negative   Bilirubin, UA Negative    Ketones, UA Negative    Spec Grav, UA 1.020 1.010 - 1.025   Blood, UA 1     pH, UA 5.5 5.0 - 8.0   Protein, UA Negative Negative   Urobilinogen, UA 0.2 0.2 or 1.0 E.U./dL   Nitrite, UA Negative    Leukocytes, UA Negative Negative   Appearance     Odor       Assessment and Plan:   Dysuria No red flags Will empirically start ciprofloxacin  Will send urine for culture and adjust treatment as indicated  I,Alexander Ruley,acting as a scribe for [employer], PA.,have documented all relevant documentation on the behalf of Jarold Motto, PA,as directed by  Jarold Motto, PA while in the presence of Jarold Motto, [place].  I, Jarold Motto, [place], have reviewed all documentation for this visit. The documentation on 10/11/23 for the exam, diagnosis, procedures, and orders are all accurate and complete.'  Jarold Motto, PA-C

## 2023-10-11 NOTE — Patient Instructions (Signed)
It was great to see you!  Start the ciprofloxacin We will be in touch with results  General instructions Make sure you: Pee until your bladder is empty. Do not hold pee for a long time. Empty your bladder after sex. Wipe from front to back after pooping if you are a female. Use each tissue one time when you wipe. Drink enough fluid to keep your pee pale yellow. Keep all follow-up visits as told by your doctor. This is important. Contact a doctor if: You do not get better after 1-2 days. Your symptoms go away and then come back. Get help right away if: You have very bad back pain. You have very bad pain in your lower belly. You have a fever. You are sick to your stomach (nauseous). You are throwing up.    Take care,  Jarold Motto PA-C

## 2023-10-12 ENCOUNTER — Telehealth: Payer: Self-pay | Admitting: Family Medicine

## 2023-10-12 LAB — URINE CULTURE
MICRO NUMBER:: 15602735
Result:: NO GROWTH
SPECIMEN QUALITY:: ADEQUATE

## 2023-10-12 MED ORDER — NITROFURANTOIN MONOHYD MACRO 100 MG PO CAPS: 100.0000 mg | ORAL_CAPSULE | Freq: Two times a day (BID) | ORAL | 0 refills | Status: DC

## 2023-10-12 NOTE — Telephone Encounter (Signed)
Adrianna, triage nurse, states final outcome was for pt to be seen within 24 hours. States pt declined OV and stated she just wanted her abx changed. Please Advise.

## 2023-10-12 NOTE — Telephone Encounter (Signed)
FYI: This call has been transferred to triage nurse: the Triage Nurse. Once the result note has been entered staff can address the message at that time.  Patient called in with the following symptoms:  Red Word:dizziness , shoulder pain, & aches all over   Please advise at Mobile [phone] (mobile)  Message is routed to Provider Pool.

## 2023-10-12 NOTE — Telephone Encounter (Signed)
Access nurse unable to reach pt. When speaking with pt before triaging, she believed this was an allergic reaction to Cipro.   Patient Name First: Natalie Last: Erickson Gender: Female DOB: 09-27-1951 Age: 72 Y 10 M 24 D Return Phone Number: [phone] (Primary) Address: City/ State/ Zip: [place]  [address] Client [facility] - Administrator, sports at [facility] Provider Jacquiline Doe- MD Contact Type Call Who Is Calling Patient / Member / Family / Caregiver Call Type Triage / Clinical Relationship To Patient Self Return Phone Number [phone] (Primary) Chief Complaint Dizziness Reason for Call Symptomatic / Request for Health Information Initial Comment Caller states she may be having an allergic reaction to medication, dizziness, shoulder pain and body aches. Last took the medication at 1 yesterday, took tylenol and magnesium. Translation No Disp. Time Lamount Cohen Time) Disposition Final User 10/12/2023 8:18:28 AM Attempt made - message left Mayford Knife, RN, Lupe Carney 10/12/2023 8:32:41 AM Attempt made - message left Mayford Knife, RN, Lupe Carney 10/12/2023 8:46:31 AM FINAL ATTEMPT MADE - message left Yes Mayford Knife, RN, Lupe Carney Final Disposition 10/12/2023 8:46:31 AM FINAL ATTEMPT MADE - message left Yes Turner, RN, Lupe Carney

## 2023-10-12 NOTE — Telephone Encounter (Signed)
Spoke to pt told her Dr. Jimmey Ralph said, Ok to send in Macrobid 100 mg twice daily x 7 days. Please have her stop ciprofloxacin.  She should let us know if not improving. Pt verbalized understanding. Rx sent.

## 2023-10-12 NOTE — Telephone Encounter (Signed)
Patient called back stating she hadn't gotten a call back from nurse. I was able to get patient re-connected with [facility] @ triage.

## 2023-10-12 NOTE — Telephone Encounter (Signed)
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 If symptomatic, describe symptoms. ---pt was seen yesterday for urinary sx. pt was placed on cipro. took first dose at 1pm and had muscle aches, headaches and dizziness. only took 1 dose. pt is still dizzy. still has headache 2/10, pain is intermittent. Does the patient have any new or worsening symptoms? ---Yes Will a triage be completed? ---Yes Related visit to physician within the last 2 weeks? ---Yes Does the PT have any chronic conditions? (i.e. diabetes, asthma, this includes High risk factors for pregnancy, etc.) ---Yes List chronic conditions. ---fibromyalgia high cholesterol Is this a behavioral health or substance abuse call? ---No Guidelines Guideline Title Affirmed Question Affirmed Notes Nurse Date/Time (Eastern Time) Dizziness - Lightheadedness [1] MODERATE dizziness (e.g., interferes with normal activities) AND [2] has NOT been evaluated by Lily Kocher, RN, Adriana 10/12/2023 2:26:09 PM Guidelines Guideline Title Affirmed Question Affirmed Notes Nurse Date/Time Lamount Cohen Time) doctor (or NP/PA) for this (Exception: Dizziness  caused by heat exposure, sudden standing, or poor fluid intake.) Urinary Tract Infection on Antibiotic Follow-up Call - Female [1] Taking antibiotic < 24 hours for UTI AND [2] fever persists (still has fever) Lily Kocher, RN, Adriana 10/12/2023 2:28:39 PM Disp. Time Lamount Cohen Time) Disposition Final User 10/12/2023 2:28:20 PM See PCP within 24 Hours Lily Kocher, [place], Ricki Rodriguez 10/12/2023 2:30:16 PM Home Care Yes Lily Kocher, RN, Ricki Rodriguez Final Disposition 10/12/2023 2:30:16 PM Home Care Yes Lily Kocher, RN, Ricki Rodriguez Caller Disagree/Comply Disagree Caller Understands Yes PreDisposition Call Doctor Care Advice Given Per Guideline SEE PCP WITHIN 24 HOURS: * IF OFFICE WILL BE OPEN: You need to be examined within the next 24 hours. Call your doctor (or NP/PA) when the office opens and make an appointment. CARE ADVICE given per Dizziness - Lightheadedness (Adult) guideline. CALL BACK IF: * You become worse * You pass out (faint) or are too weak to stand DRINK FLUIDS: * Drink several glasses of fruit juice, other clear fluids or water. HOME CARE: CARE ADVICE given per Urinary Tract Infection on Antibiotic Follow-up Call - Female (Adult) guideline. Comments User: Jerilynn Birkenhead, RN Date/Time (Eastern Time): 10/12/2023 2:33:01 PM havlyn at the office states they will be giving pt a call back

## 2023-10-12 NOTE — Telephone Encounter (Signed)
Ok to send in Macrobid 100 mg twice daily x 7 days.  Please have her stop ciprofloxacin.  She should let us know if not improving.  Katina Degree. Jimmey Ralph, MD 10/12/2023 2:42 PM

## 2023-10-12 NOTE — Telephone Encounter (Signed)
Dr. Jimmey Ralph, please see message and advise. Pt refused to be seen,just wants her antibiotic changed.

## 2023-10-13 ENCOUNTER — Other Ambulatory Visit: Payer: PPO

## 2023-10-13 ENCOUNTER — Ambulatory Visit
Admission: RE | Admit: 2023-10-13 | Discharge: 2023-10-13 | Disposition: A | Discharge status: Home / Self Care | Payer: PPO | Source: Ambulatory Visit | Attending: Internal Medicine | Admitting: Internal Medicine

## 2023-10-13 DIAGNOSIS — E042 Nontoxic multinodular goiter: Secondary | ICD-10-CM

## 2023-10-13 DIAGNOSIS — E041 Nontoxic single thyroid nodule: Secondary | ICD-10-CM | POA: Diagnosis not present

## 2023-10-20 DIAGNOSIS — N83209 Unspecified ovarian cyst, unspecified side: Secondary | ICD-10-CM | POA: Insufficient documentation

## 2023-10-24 DIAGNOSIS — R102 Pelvic and perineal pain: Secondary | ICD-10-CM | POA: Diagnosis not present

## 2023-10-24 DIAGNOSIS — R300 Dysuria: Secondary | ICD-10-CM | POA: Diagnosis not present

## 2023-10-24 DIAGNOSIS — N952 Postmenopausal atrophic vaginitis: Secondary | ICD-10-CM | POA: Diagnosis not present

## 2023-10-24 DIAGNOSIS — B3731 Acute candidiasis of vulva and vagina: Secondary | ICD-10-CM | POA: Diagnosis not present

## 2023-10-26 DIAGNOSIS — G4733 Obstructive sleep apnea (adult) (pediatric): Secondary | ICD-10-CM | POA: Diagnosis not present

## 2023-11-03 ENCOUNTER — Ambulatory Visit (INDEPENDENT_AMBULATORY_CARE_PROVIDER_SITE_OTHER): Payer: PPO | Admitting: Family Medicine

## 2023-11-03 VITALS — BP 115/70 | HR 73 | Temp 97.3°F | Wt 158.2 lb

## 2023-11-03 DIAGNOSIS — M479 Spondylosis, unspecified: Secondary | ICD-10-CM

## 2023-11-03 DIAGNOSIS — M545 Low back pain, unspecified: Secondary | ICD-10-CM

## 2023-11-03 DIAGNOSIS — M797 Fibromyalgia: Secondary | ICD-10-CM | POA: Diagnosis not present

## 2023-11-03 MED ORDER — FAMOTIDINE 40 MG PO TABS
40.0000 mg | ORAL_TABLET | Freq: Every day | ORAL | 5 refills | Status: DC
Start: 2023-11-03 — End: 2024-06-19

## 2023-11-03 MED ORDER — MELOXICAM 15 MG PO TABS
15.0000 mg | ORAL_TABLET | Freq: Every day | ORAL | 0 refills | Status: DC
Start: 2023-11-03 — End: 2024-06-19

## 2023-11-03 MED ORDER — BACLOFEN 5 MG PO TABS: 5.0000 mg | ORAL_TABLET | Freq: Three times a day (TID) | ORAL | Status: DC

## 2023-11-03 NOTE — Assessment & Plan Note (Signed)
Contributing to her above back pain.  See above.  We are starting meloxicam and baclofen.  Working on home exercises.  Checking plain film today.  Will refer to sports medicine if this continues to be an issue.

## 2023-11-03 NOTE — Patient Instructions (Signed)
It was very nice to see you today!  I think you may have piriformis syndrome.  Please start the medications and work on the exercises.  Will get an x-ray.  Let us know if not improving in the next 1 to 2 weeks.  Return if symptoms worsen or fail to improve.   Take care, Dr Jimmey Ralph  PLEASE NOTE:  If you had any lab tests, please let us know if you have not heard back within a few days. You may see your results on mychart before we have a chance to review them but we will give you a call once they are reviewed by [place].   If we ordered any referrals today, please let us know if you have not heard from their office within the next week.   If you had any urgent prescriptions sent in today, please check with the pharmacy within an hour of our visit to make sure the prescription was transmitted appropriately.   Please try these tips to maintain a healthy lifestyle:  Eat at least 3 REAL meals and 1-2 snacks per day.  Aim for no more than 5 hours between eating.  If you eat breakfast, please do so within one hour of getting up.   Each meal should contain half fruits/vegetables, one quarter protein, and one quarter carbs (no bigger than a computer mouse)  Cut down on sweet beverages. This includes juice, soda, and sweet tea.   Drink at least 1 glass of water with each meal and aim for at least 8 glasses per day  Exercise at least 150 minutes every week.

## 2023-11-03 NOTE — Assessment & Plan Note (Signed)
Along with neurology for this.  Also may be contributing to her above pain.  Hopefully she will be able to maintain her previous exercise regimen once her back pain improves.

## 2023-11-03 NOTE — Progress Notes (Signed)
   Natalie Erickson is a 72 y.o. female who presents today for an office visit.  Assessment/Plan:  New/Acute Problems: Low Back Pain  History exam consistent with possible piriformis syndrome.  No red flags.  She does have multiple other contributing factors including fibromyalgia and spondylosis which are likely contributing as well.  She is getting a gynecologic workup via GYN and has pelvic floor ultrasound pending.  We did try physical therapy several months ago however she did not have much improvement with this.  We discussed home exercise program and handout was given.  Will also start course of meloxicam and baclofen.  She can start famotidine for GI protection while on meloxicam.  Given length of symptoms would be reasonable for [place] to check plain film at this point to further evaluate.  She will let us know if not improving in the next couple of weeks and we can refer back to sports medicine.  We discussed reasons to return to care and seek emergent care.  Chronic Problems Addressed Today: Spondylosis Contributing to her above back pain.  See above.  We are starting meloxicam and baclofen.  Working on home exercises.  Checking plain film today.  Will refer to sports medicine if this continues to be an issue.  Fibromyalgia Along with neurology for this.  Also may be contributing to her above pain.  Hopefully she will be able to maintain her previous exercise regimen once her back pain improves.      Subjective:  HPI:  See A/P for status of chronic conditions.  Patient is here with low back pain.  This has been going on for several months.  Pain mostly radiating into her left groin and left pelvic area.  We did discuss this several months ago and referred her to physical therapy.  She tried several sessions of this however symptoms did not improve.  Since our last visit she has followed here with a different provider for concern for UTI.  This workup was negative.  She subsequently went to  OB/GYN.  She was diagnosed with a yeast infection was started on Diflucan and they have currently scheduled a pelvic ultrasound.  Symptoms have been stable the last couple of weeks.  Pain is located in her lower back and her buttocks and radiates into her buttocks and groin as above.  She tried taking Advil without much improvement.  She stopped this due to GI upset.  Pain does improve with rest and heat.  She does occasionally get a tingling sensation radiating into her feet.      Objective:  Physical Exam: BP 115/70   Pulse 73   Temp (!) 97.3 F (36.3 C) (Temporal)   Wt 158 lb 3.2 oz (71.8 kg)   SpO2 100%   BMI 28.94 kg/m   Gen: No acute distress, resting comfortably CV: Regular rate and rhythm with no murmurs appreciated Pulm: Normal work of breathing, clear to auscultation bilaterally with no crackles, wheezes, or rhonchi MUSCULOSKELETAL: - Back: No deformities.  Tenderness to palpation along left medial gluteal area.  Pain elicited with external and internal rotation of left hip.  Neurovascular intact distally. Neuro: Grossly normal, moves all extremities Psych: Normal affect and thought content      Dlisa Barnwell M. Jimmey Ralph, MD 11/03/2023 9:36 AM

## 2023-11-07 ENCOUNTER — Ambulatory Visit
Admission: RE | Admit: 2023-11-07 | Discharge: 2023-11-07 | Disposition: A | Discharge status: Home / Self Care | Payer: PPO | Source: Ambulatory Visit | Attending: Family Medicine | Admitting: Family Medicine

## 2023-11-07 DIAGNOSIS — M545 Low back pain, unspecified: Secondary | ICD-10-CM | POA: Diagnosis not present

## 2023-11-07 DIAGNOSIS — M479 Spondylosis, unspecified: Secondary | ICD-10-CM

## 2023-11-10 NOTE — Therapy (Unsigned)
OUTPATIENT PHYSICAL THERAPY FEMALE PELVIC EVALUATION   Patient Name: Natalie Erickson MRN: [id number] DOB:1951/12/24, 72 y.o., female Today's Date: 11/13/2023  END OF SESSION:  PT End of Session - 11/13/23 1530     Visit Number 1    Date for PT Re-Evaluation 01/08/24    Authorization Type [employer]    Authorization - Visit Number 1    Authorization - Number of Visits 10    PT Start Time 1530    PT Stop Time 1610    PT Time Calculation (min) 40 min    Activity Tolerance Patient tolerated treatment well    Behavior During Therapy [facility] for tasks assessed/performed             Past Medical History:  Diagnosis Date   AC (acromioclavicular) joint bone spurs    c 5 and c6 and thoracic   Allergy    food allergies   Anxiety 04/06/2017   Benign thyroid cyst sept 2020 dx   Cataract    bilateral   Cystitis    Depression    Dry eyes 04/06/2017   Elevated BP without diagnosis of hypertension 04/06/2017   lost weight >> BP improved   Fibromyalgia    Floaters    both eyes   Heart murmur    hx of 2 Echocardiograms in [place] (pt was told they were normal)   Hiatal hernia    Hip pain    and groin pain for a while   History of colon polyps    History of echocardiogram    Echo 9/19: mild LVH, EF 60-65, no RWMA, Gr 1 DD, trivial MR/TR, PASP 16   History of kidney stones    History of Lyme disease    Hypercalcemia    causes kidney stones, none recent   IBS (irritable bowel syndrome)    Mixed hyperlipidemia 04/06/2017   intol to statins due to myalgias; never tried Zetia   Occipital neuralgia    Sleep apnea    mild to moderate , uses cpap   TMJ (dislocation of temporomandibular joint)    UTI (urinary tract infection) finishes antibiotic 03-27-2020   Vasomotor rhinitis    Past Surgical History:  Procedure Laterality Date   APPENDECTOMY  1959   COLONOSCOPY  2016   [place]   COLONOSCOPY  2011   in [place]    EVALUATION UNDER ANESTHESIA WITH HEMORRHOIDECTOMY  N/A 04/02/2020   Procedure: ANORECTAL EXAM UNDER ANESTHESIA WITH HEMORRHOIDECTOMY, HEMORRHOIDAL LIGATION;  Surgeon: Karie Soda, MD;  Location: [facility];  Service: General;  Laterality: N/A;  GENERAL AND LOCAL   left ovarin cyst removed  age 12's   grapefruit sized, exp laparotomy   POLYPECTOMY     UPPER GASTROINTESTINAL ENDOSCOPY     UPPER GI ENDOSCOPY     Patient Active Problem List   Diagnosis Date Noted   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   Spondylosis 03/07/2023   Depression, recurrent (HCC) 10/28/2020   Aortic atherosclerosis (HCC) 10/28/2020   Statin intolerance 10/28/2020   OSA on CPAP 10/28/2020   Multiple thyroid nodules 10/28/2020   Vitamin D deficiency 10/28/2020   B12 deficiency 10/28/2020   DDD (degenerative disc disease), cervical 01/08/2020   Bilateral occipital neuralgia 01/08/2020   Somatization disorder 11/14/2018   Posterior vitreous detachment of right eye 09/10/2018   IBS (irritable bowel syndrome) 01/30/2018   Fibromyalgia 06/04/2017   Chronic fatigue 06/04/2017   ANA positive 05/20/2017   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  Rationale for Evaluation and Treatment: Rehabilitation  ONSET DATE: 2024  SUBJECTIVE:                                                                                                                                                                                           SUBJECTIVE STATEMENT: I have discomfort rectally. Patient has started with back pain and left gluteal pain recently. I have nerve sensation in coccyx area. Pain started 8 weeks ago. I do not have prolapse. Tomorrow getting a pelvic ultrasound.  Fluid intake: Yes:  mostly water, tea and everything is decaffeinated     PAIN:  Are you having pain? Yes NPRS scale: 6/10 Pain location:  low back down left leg into left groin and pubic bone  Pain type: aching, dull, and pressure Pain description: intermittent   Aggravating factors: too much walking, sitting too long,  Relieving factors: laying on back and right side, lidocaine patch  PRECAUTIONS: None  RED FLAGS: None   WEIGHT BEARING RESTRICTIONS: No  FALLS:  Has patient fallen in last 6 months? No  LIVING ENVIRONMENT: Lives with: lives with their family OCCUPATION: retired Engineer, civil (consulting)  PLOF: Independent  PATIENT GOALS: get back to walking and exercise without discomfort  PERTINENT HISTORY:  Appendectomy; Fibromyalgia; Lyme disease; IBS: TMJ; ICS  BOWEL MOVEMENT: Pain with bowel movement: Yes, sometimes and after the bowel movement 5/10 and lasts awhile Type of bowel movement:Type (Bristol Stool Scale) Type 1, 3, 4 , Frequency daily, Strain No, and Splinting no Fully empty rectum: Yes: not all the time Leakage: Yes: if too soft and since her hemorrhoidectomy 2.5 years ago . Stool leakage 2 times per week Pads: No Fiber supplement: Yes: miralax  URINATION: Pain with urination: No Fully empty bladder: Yes:   Stream: Strong Urgency: No Frequency: average Leakage:  none Pads: No  INTERCOURSE: not active   PREGNANCY: Vaginal deliveries 2 Pain where the episiotomy       OBJECTIVE:  Note: Objective measures were completed at Evaluation unless otherwise noted.  DIAGNOSTIC FINDINGS:  none   COGNITION: Overall cognitive status: Within functional limits for tasks assessed      LUMBAR SPECIAL TESTS:  FABER test: Positive on left and felt it in the left hip   POSTURE: No Significant postural limitations  PELVIC ALIGNMENT:sacrum rotated left  LUMBARAROM/PROM:  A/PROM A/PROM  eval  Flexion Full  with pain in rectum and along the gluteals  Extension Decreased by 25%   Right lateral flexion full  Left lateral flexion full  Right rotation Decreased by 25%  Left rotation Decreased by 25%   (Blank rows = not tested)  LOWER EXTREMITY BMW:[id number] hip ROM is full   LOWER EXTREMITY MMT: bilateral hip strength is 4/5   PALPATION:   General  tenderness located in the hip adductors, along the pubic bone, left piriformis, levator ani, sacrum rotated left                External Perineal Exam decreased mobility of the perineal body                             Internal Pelvic Floor tenderness located in the iliococcygeus and coccygeus, and along the coccyx bone  Patient confirms identification and approves PT to assess internal pelvic floor and treatment Yes  PELVIC MMT:   MMT eval  Internal Anal Sphincter 2/5  External Anal Sphincter 2/5  Puborectalis 2/5  (Blank rows = not tested)        TONE: average  PROLAPSE: none  TODAY'S TREATMENT:                                                                                                                              DATE: 11/13/23  EVAL see below   PATIENT EDUCATION:  11/13/23 Education details: educated patient on how to use the wand to work on pelvic floor trigger points to reduce her pain using the wand and pelvic floor model.  Person educated: Patient Education method: Explanation, Demonstration, Tactile cues, Verbal cues, and Handouts Education comprehension: verbalized understanding, returned demonstration, verbal cues required, tactile cues required, and needs further education  HOME EXERCISE PROGRAM: See above.   ASSESSMENT:  CLINICAL IMPRESSION: Patient is a 72 y.o. female who was seen today for physical therapy evaluation and treatment for perineal and pelvic pain. Patient reports her pain has increased in the last 8 weeks. She is having pain with bowel movements at level 5/10 and last awhile. Patient has stool leakage 2 times per week when her stool is soft. Her stool leakage  started 2.5 years ago when she had a hemorrhoidectomy. Rectal strength is 2/5. She has tenderness located in the iliococcygeus, coccygeus, and coccyx. She has restrictions in the perineal body. She has tenderness located in bilateral hip adductor, piriformis, levator ani. She has positive FABER test on the left with pain in the posterior left hip. Patient will benefit from skilled therapy to improve mobility, reduce stool leakage and reduce her pain to a manageable level.    OBJECTIVE IMPAIRMENTS: decreased coordination, decreased strength, increased fascial restrictions, increased muscle spasms, and pain.   ACTIVITY LIMITATIONS: sitting, continence, toileting, and locomotion level  PARTICIPATION LIMITATIONS: meal prep, driving, shopping, and community activity  PERSONAL FACTORS: Age, Time since onset of injury/illness/exacerbation, and  1-2 comorbidities: Appendectomy; Fibromyalgia; Lyme disease; IBS: TMJ; ICS  are also affecting patient's functional outcome.   REHAB POTENTIAL: Good  CLINICAL DECISION MAKING: Evolving/moderate complexity  EVALUATION COMPLEXITY: Moderate   GOALS: Goals reviewed with patient? Yes  SHORT TERM GOALS: Target date: 12/09/23  Patient understands how to use the pelvic wand in the rectal area to reduce her trigger points.  Baseline: Goal status: INITIAL  2.  Patient understands how to bulge the pelvic floor to lengthen the muscles.  Baseline:  Goal status: INITIAL   LONG TERM GOALS: Target date: 01/08/24  Patient independent with advanced HEP for core and pelvic floor strength.  Baseline:  Goal status: INITIAL  2.  Patient understands ways to manage her pain using her wand, stretches, diaphragmatic breathing and modalities.  Baseline:  Goal status: INITIAL  3.  Patient reports she has not had stool leakage for 2 weeks due to reduction of pain and improved strength.  Baseline:  Goal status: INITIAL  4.  Pelvic floor strength >/= 3/5 holding 15-20  seconds to reduce her continence.  Baseline:  Goal status: INITIAL  5.  Her pelvic and perineal pain decreased >/= 50% due to reduction of trigger points.  Baseline:  Goal status: INITIAL   PLAN:  PT FREQUENCY: 1x/week  PT DURATION: 8 weeks  PLANNED INTERVENTIONS: 97110-Therapeutic exercises, 97530- Therapeutic activity, 97112- Neuromuscular re-education, 97140- Manual therapy, 97014- Electrical stimulation (unattended), 97035- Ultrasound, Patient/Family education, Dry Needling, Joint mobilization, Spinal mobilization, Cryotherapy, Moist heat, and Biofeedback  PLAN FOR NEXT SESSION: manual work to the pelvic floor, along the piriformis, dry needling, hip adductors, stretches   Eulis Foster, PT 11/13/23 4:17 PM

## 2023-11-13 ENCOUNTER — Ambulatory Visit: Payer: PPO | Attending: Obstetrics and Gynecology | Admitting: Physical Therapy

## 2023-11-13 ENCOUNTER — Encounter: Payer: Self-pay | Admitting: Physical Therapy

## 2023-11-13 ENCOUNTER — Other Ambulatory Visit: Payer: Self-pay

## 2023-11-13 DIAGNOSIS — R102 Pelvic and perineal pain: Secondary | ICD-10-CM | POA: Insufficient documentation

## 2023-11-13 DIAGNOSIS — R278 Other lack of coordination: Secondary | ICD-10-CM | POA: Diagnosis not present

## 2023-11-13 DIAGNOSIS — M6281 Muscle weakness (generalized): Secondary | ICD-10-CM | POA: Diagnosis not present

## 2023-11-14 DIAGNOSIS — R102 Pelvic and perineal pain: Secondary | ICD-10-CM | POA: Diagnosis not present

## 2023-11-14 DIAGNOSIS — N83202 Unspecified ovarian cyst, left side: Secondary | ICD-10-CM | POA: Diagnosis not present

## 2023-11-14 DIAGNOSIS — D251 Intramural leiomyoma of uterus: Secondary | ICD-10-CM | POA: Diagnosis not present

## 2023-11-27 ENCOUNTER — Encounter: Payer: Self-pay | Admitting: Physical Therapy

## 2023-11-27 ENCOUNTER — Ambulatory Visit: Payer: PPO | Attending: Obstetrics and Gynecology | Admitting: Physical Therapy

## 2023-11-27 DIAGNOSIS — R102 Pelvic and perineal pain: Secondary | ICD-10-CM | POA: Diagnosis not present

## 2023-11-27 DIAGNOSIS — M6281 Muscle weakness (generalized): Secondary | ICD-10-CM | POA: Diagnosis not present

## 2023-11-27 DIAGNOSIS — R278 Other lack of coordination: Secondary | ICD-10-CM | POA: Diagnosis not present

## 2023-11-27 NOTE — Therapy (Signed)
OUTPATIENT PHYSICAL THERAPY FEMALE PELVIC TREATMENT   Patient Name: Natalie Erickson MRN: [id number] DOB:Sep 06, 1951, 72 y.o., female Today's Date: 11/27/2023  END OF SESSION:  PT End of Session - 11/27/23 1237     Visit Number 2    Date for PT Re-Evaluation 01/08/24    Authorization Type [employer]    Authorization - Visit Number 2    Authorization - Number of Visits 10    PT Start Time 1230    PT Stop Time 1310    PT Time Calculation (min) 40 min    Activity Tolerance Patient tolerated treatment well    Behavior During Therapy WFL for tasks assessed/performed             Past Medical History:  Diagnosis Date   AC (acromioclavicular) joint bone spurs    c 5 and c6 and thoracic   Allergy    food allergies   Anxiety 04/06/2017   Benign thyroid cyst sept 2020 dx   Cataract    bilateral   Cystitis    Depression    Dry eyes 04/06/2017   Elevated BP without diagnosis of hypertension 04/06/2017   lost weight >> BP improved   Fibromyalgia    Floaters    both eyes   Heart murmur    hx of 2 Echocardiograms in [place] (pt was told they were normal)   Hiatal hernia    Hip pain    and groin pain for a while   History of colon polyps    History of echocardiogram    Echo 9/19: mild LVH, EF 60-65, no RWMA, Gr 1 DD, trivial MR/TR, PASP 16   History of kidney stones    History of Lyme disease    Hypercalcemia    causes kidney stones, none recent   IBS (irritable bowel syndrome)    Mixed hyperlipidemia 04/06/2017   intol to statins due to myalgias; never tried Zetia   Occipital neuralgia    Sleep apnea    mild to moderate , uses cpap   TMJ (dislocation of temporomandibular joint)    UTI (urinary tract infection) finishes antibiotic 03-27-2020   Vasomotor rhinitis    Past Surgical History:  Procedure Laterality Date   APPENDECTOMY  1959   COLONOSCOPY  2016   [place]   COLONOSCOPY  2011   in [place]    EVALUATION UNDER ANESTHESIA WITH HEMORRHOIDECTOMY N/A  04/02/2020   Procedure: ANORECTAL EXAM UNDER ANESTHESIA WITH HEMORRHOIDECTOMY, HEMORRHOIDAL LIGATION;  Surgeon: Karie Soda, MD;  Location: [facility];  Service: General;  Laterality: N/A;  GENERAL AND LOCAL   left ovarin cyst removed  age 45's   grapefruit sized, exp laparotomy   POLYPECTOMY     UPPER GASTROINTESTINAL ENDOSCOPY     UPPER GI ENDOSCOPY     Patient Active Problem List   Diagnosis Date Noted   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   Spondylosis 03/07/2023   Depression, recurrent (HCC) 10/28/2020   Aortic atherosclerosis (HCC) 10/28/2020   Statin intolerance 10/28/2020   OSA on CPAP 10/28/2020   Multiple thyroid nodules 10/28/2020   Vitamin D deficiency 10/28/2020   B12 deficiency 10/28/2020   DDD (degenerative disc disease), cervical 01/08/2020   Bilateral occipital neuralgia 01/08/2020   Somatization disorder 11/14/2018   Posterior vitreous detachment of right eye 09/10/2018   IBS (irritable bowel syndrome) 01/30/2018   Fibromyalgia 06/04/2017   Chronic fatigue 06/04/2017   ANA positive 05/20/2017   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   Interstitial cystitis 05/20/2017  Dyspepsia 05/20/2017   Mixed hyperlipidemia 04/06/2017   Anxiety 04/06/2017    PCP: Ardith Dark, MD  REFERRING PROVIDER: Vick Frees, MD   REFERRING DIAG: R10.2 (ICD-10-CM) - Pelvic and perineal pain   THERAPY DIAG:  Muscle weakness (generalized)  Pelvic pain  Other lack of coordination  Rationale for Evaluation and Treatment: Rehabilitation  ONSET DATE: 2024  SUBJECTIVE:                                                                                                                                                                                           SUBJECTIVE STATEMENT: Ultrasound showed cyst on ovary and want me to have another.  Fluid intake: Yes: mostly water, tea and everything is decaffeinated     PAIN:  Are you having pain? Yes NPRS scale:  3-4/10 Pain location:  low back down left leg into left groin and pubic bone  Pain type: aching, dull, and pressure Pain description: intermittent   Aggravating factors: too much walking, sitting too long,  Relieving factors: laying on back and right side, lidocaine patch  PRECAUTIONS: None  RED FLAGS: None   WEIGHT BEARING RESTRICTIONS: No  FALLS:  Has patient fallen in last 6 months? No  LIVING ENVIRONMENT: Lives with: lives with their family OCCUPATION: retired Engineer, civil (consulting)  PLOF: Independent  PATIENT GOALS: get back to walking and exercise without discomfort  PERTINENT HISTORY:  Appendectomy; Fibromyalgia; Lyme disease; IBS: TMJ; ICS  BOWEL MOVEMENT: Pain with bowel movement: Yes, sometimes and after the bowel movement 5/10 and lasts awhile Type of bowel movement:Type (Bristol Stool Scale) Type 1, 3, 4 , Frequency daily, Strain No, and Splinting no Fully empty rectum: Yes: not all the time Leakage: Yes: if too soft and since her hemorrhoidectomy 2.5 years ago . Stool leakage 2 times per week Pads: No Fiber supplement: Yes: miralax  URINATION: Pain with urination: No Fully empty bladder: Yes:   Stream: Strong Urgency: No Frequency: average Leakage:  none Pads: No  INTERCOURSE: not active   PREGNANCY: Vaginal deliveries 2 Pain where the episiotomy       OBJECTIVE:  Note: Objective measures were completed at Evaluation unless otherwise noted.  DIAGNOSTIC FINDINGS:  none   COGNITION: Overall cognitive status: Within functional limits for tasks assessed      LUMBAR SPECIAL TESTS:  FABER test: Positive on left and felt it in the left hip   POSTURE: No Significant postural limitations  PELVIC ALIGNMENT:sacrum rotated left  LUMBARAROM/PROM:  A/PROM A/PROM  eval  Flexion Full with pain in rectum and along the gluteals  Extension Decreased by 25%  Right lateral flexion full  Left lateral flexion full  Right rotation Decreased by 25%  Left  rotation Decreased by 25%   (Blank rows = not tested)  LOWER EXTREMITY [id number] hip ROM is full   LOWER EXTREMITY MMT: bilateral hip strength is 4/5   PALPATION:   General  tenderness located in the hip adductors, along the pubic bone, left piriformis, levator ani, sacrum rotated left                External Perineal Exam decreased mobility of the perineal body                             Internal Pelvic Floor tenderness located in the iliococcygeus and coccygeus, and along the coccyx bone  Patient confirms identification and approves PT to assess internal pelvic floor and treatment Yes  PELVIC MMT:   MMT eval  Internal Anal Sphincter 2/5  External Anal Sphincter 2/5  Puborectalis 2/5  (Blank rows = not tested)        TONE: average  PROLAPSE: none  TODAY'S TREATMENT:   11/27/23 Manual: Soft tissue mobilization: Soft tissue work to left hip adductor, hamstring and levator ani to lengthen the muscle and decrease pain Exercises: Stretches/mobility: Happy baby holding 30 sec Piriformis stretch holding 30 sec on left in supine and sitting Hip adductor stretch in sitting on edge of the mat and long sitting Educated patient on how to massage her hip adductor and levator ani in reclined position Modified Thomas stretch                                                                                                                               DATE: 11/13/23  EVAL see below  PATIENT EDUCATION: 11/27/23 Education details: Access Code: [facility], educated patient on how to massage her hip adductors and levator ani in sitting Person educated: Patient Education method: Programmer, multimedia, Demonstration, Tactile cues, Verbal cues, and Handouts Education comprehension: verbalized understanding, returned demonstration, verbal cues required, tactile cues required, and needs further education   HOME EXERCISE PROGRAM: 11/27/23 Access Code: [facility] URL:  [email or web address] Date: 11/27/2023 Prepared by: Eulis Foster  Exercises - Happy Baby with Pelvic Floor Lengthening  - 1 x daily - 7 x weekly - 1 sets - 1 reps - 30 sec hold - Modified Thomas Stretch  - 1 x daily - 7 x weekly - 1 sets - 1 reps - 30 sec hold - Seated Hip Adductor Stretch on Swiss Ball  - 1 x daily - 7 x weekly - 1 sets - 1 reps - 30 sec hold - Seated Piriformis Stretch with Trunk Bend  - 1 x daily - 7 x weekly - 1 sets - 1 reps - 30 sec hold  ASSESSMENT:  CLINICAL IMPRESSION: Patient is a 72 y.o. female who was seen today for physical therapy  treatment for perineal and pelvic pain. Patient reports her pain has decreased  to 3-4/10.  Patient has more pain in the left groin than the rectum and comes and goes. Patient has not used the wand on the pelvic floor muscles.  Patient left hip adductor relaxed well after the manual work. Patient will benefit from skilled therapy to improve mobility, reduce stool leakage and reduce her pain to a manageable level.    OBJECTIVE IMPAIRMENTS: decreased coordination, decreased strength, increased fascial restrictions, increased muscle spasms, and pain.   ACTIVITY LIMITATIONS: sitting, continence, toileting, and locomotion level  PARTICIPATION LIMITATIONS: meal prep, driving, shopping, and community activity  PERSONAL FACTORS: Age, Time since onset of injury/illness/exacerbation, and 1-2 comorbidities: Appendectomy; Fibromyalgia; Lyme disease; IBS: TMJ; ICS  are also affecting patient's functional outcome.   REHAB POTENTIAL: Good  CLINICAL DECISION MAKING: Evolving/moderate complexity  EVALUATION COMPLEXITY: Moderate   GOALS: Goals reviewed with patient? Yes  SHORT TERM GOALS: Target date: 12/09/23  Patient understands how to use the pelvic wand in the rectal area to reduce her trigger points.  Baseline: Goal status: Met 11/27/23  2.  Patient understands how to bulge the pelvic floor to lengthen the muscles.   Baseline:  Goal status: INITIAL   LONG TERM GOALS: Target date: 01/08/24  Patient independent with advanced HEP for core and pelvic floor strength.  Baseline:  Goal status: INITIAL  2.  Patient understands ways to manage her pain using her wand, stretches, diaphragmatic breathing and modalities.  Baseline:  Goal status: INITIAL  3.  Patient reports she has not had stool leakage for 2 weeks due to reduction of pain and improved strength.  Baseline:  Goal status: INITIAL  4.  Pelvic floor strength >/= 3/5 holding 15-20 seconds to reduce her continence.  Baseline:  Goal status: INITIAL  5.  Her pelvic and perineal pain decreased >/= 50% due to reduction of trigger points.  Baseline:  Goal status: INITIAL   PLAN:  PT FREQUENCY: 1x/week  PT DURATION: 8 weeks  PLANNED INTERVENTIONS: 97110-Therapeutic exercises, 97530- Therapeutic activity, [id number]- Neuromuscular re-education, 97140- Manual therapy, 97014- Electrical stimulation (unattended), 97035- Ultrasound, Patient/Family education, Dry Needling, Joint mobilization, Spinal mobilization, Cryotherapy, Moist heat, and Biofeedback  PLAN FOR NEXT SESSION: manual work to the pelvic floor, along the piriformis, diaphragmatic breathing to bulge the pelvic floor, correct sacrum and SI joint gapping   Eulis Foster, PT 11/27/23 1:18 PM

## 2023-11-27 NOTE — Progress Notes (Signed)
Her x-ray shows arthritis in her back.  No fractures or acute abnormalities.  She should let us know if her symptoms are not improving and we can refer her to sports medicine.

## 2023-12-04 ENCOUNTER — Encounter: Payer: Self-pay | Admitting: Physical Therapy

## 2023-12-04 ENCOUNTER — Ambulatory Visit: Payer: PPO | Admitting: Physical Therapy

## 2023-12-04 DIAGNOSIS — M6281 Muscle weakness (generalized): Secondary | ICD-10-CM

## 2023-12-04 DIAGNOSIS — R278 Other lack of coordination: Secondary | ICD-10-CM

## 2023-12-04 DIAGNOSIS — R102 Pelvic and perineal pain: Secondary | ICD-10-CM

## 2023-12-04 NOTE — Therapy (Signed)
OUTPATIENT PHYSICAL THERAPY FEMALE PELVIC TREATMENT   Patient Name: Natalie Erickson MRN: [id number] DOB:September 30, 1951, 72 y.o., female Today's Date: 12/04/2023  END OF SESSION:  PT End of Session - 12/04/23 0925     Visit Number 3    Date for PT Re-Evaluation 01/08/24    Authorization Type [employer]    Authorization - Visit Number 3    Authorization - Number of Visits 10    PT Start Time 0925    PT Stop Time 1010    PT Time Calculation (min) 45 min    Activity Tolerance Patient tolerated treatment well    Behavior During Therapy [facility] for tasks assessed/performed             Past Medical History:  Diagnosis Date   AC (acromioclavicular) joint bone spurs    c 5 and c6 and thoracic   Allergy    food allergies   Anxiety 04/06/2017   Benign thyroid cyst sept 2020 dx   Cataract    bilateral   Cystitis    Depression    Dry eyes 04/06/2017   Elevated BP without diagnosis of hypertension 04/06/2017   lost weight >> BP improved   Fibromyalgia    Floaters    both eyes   Heart murmur    hx of 2 Echocardiograms in [place] (pt was told they were normal)   Hiatal hernia    Hip pain    and groin pain for a while   History of colon polyps    History of echocardiogram    Echo 9/19: mild LVH, EF 60-65, no RWMA, Gr 1 DD, trivial MR/TR, PASP 16   History of kidney stones    History of Lyme disease    Hypercalcemia    causes kidney stones, none recent   IBS (irritable bowel syndrome)    Mixed hyperlipidemia 04/06/2017   intol to statins due to myalgias; never tried Zetia   Occipital neuralgia    Sleep apnea    mild to moderate , uses cpap   TMJ (dislocation of temporomandibular joint)    UTI (urinary tract infection) finishes antibiotic 03-27-2020   Vasomotor rhinitis    Past Surgical History:  Procedure Laterality Date   APPENDECTOMY  1959   COLONOSCOPY  2016   [place]   COLONOSCOPY  2011   in [place]    EVALUATION UNDER ANESTHESIA WITH HEMORRHOIDECTOMY N/A  04/02/2020   Procedure: ANORECTAL EXAM UNDER ANESTHESIA WITH HEMORRHOIDECTOMY, HEMORRHOIDAL LIGATION;  Surgeon: Karie Soda, MD;  Location: [facility];  Service: General;  Laterality: N/A;  GENERAL AND LOCAL   left ovarin cyst removed  age 32's   grapefruit sized, exp laparotomy   POLYPECTOMY     UPPER GASTROINTESTINAL ENDOSCOPY     UPPER GI ENDOSCOPY     Patient Active Problem List   Diagnosis Date Noted   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   Spondylosis 03/07/2023   Depression, recurrent (HCC) 10/28/2020   Aortic atherosclerosis (HCC) 10/28/2020   Statin intolerance 10/28/2020   OSA on CPAP 10/28/2020   Multiple thyroid nodules 10/28/2020   Vitamin D deficiency 10/28/2020   B12 deficiency 10/28/2020   DDD (degenerative disc disease), cervical 01/08/2020   Bilateral occipital neuralgia 01/08/2020   Somatization disorder 11/14/2018   Posterior vitreous detachment of right eye 09/10/2018   IBS (irritable bowel syndrome) 01/30/2018   Fibromyalgia 06/04/2017   Chronic fatigue 06/04/2017   ANA positive 05/20/2017   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   Interstitial cystitis 05/20/2017  Dyspepsia 05/20/2017   Mixed hyperlipidemia 04/06/2017   Anxiety 04/06/2017    PCP: Ardith Dark, MD  REFERRING PROVIDER: Vick Frees, MD   REFERRING DIAG: R10.2 (ICD-10-CM) - Pelvic and perineal pain   THERAPY DIAG:  Muscle weakness (generalized)  Pelvic pain  Other lack of coordination  Rationale for Evaluation and Treatment: Rehabilitation  ONSET DATE: 2024  SUBJECTIVE:                                                                                                                                                                                           SUBJECTIVE STATEMENT: I have been sore today due to the weather. I still have stool leakage and happened yesterday.  Fluid intake: Yes: mostly water, tea and everything is decaffeinated     PAIN:  Are you having  pain? Yes NPRS scale: 5/10 Pain location:  low back down left leg into left groin and pubic bone  Pain type: aching, dull, and pressure Pain description: intermittent   Aggravating factors: too much walking, sitting too long,  Relieving factors: laying on back and right side, lidocaine patch  PRECAUTIONS: None  RED FLAGS: None   WEIGHT BEARING RESTRICTIONS: No  FALLS:  Has patient fallen in last 6 months? No  LIVING ENVIRONMENT: Lives with: lives with their family OCCUPATION: retired Engineer, civil (consulting)  PLOF: Independent  PATIENT GOALS: get back to walking and exercise without discomfort  PERTINENT HISTORY:  Appendectomy; Fibromyalgia; Lyme disease; IBS: TMJ; ICS  BOWEL MOVEMENT: Pain with bowel movement: Yes, sometimes and after the bowel movement 5/10 and lasts awhile Type of bowel movement:Type (Bristol Stool Scale) Type 1, 3, 4 , Frequency daily, Strain No, and Splinting no Fully empty rectum: Yes: not all the time Leakage: Yes: if too soft and since her hemorrhoidectomy 2.5 years ago . Stool leakage 2 times per week Pads: No Fiber supplement: Yes: miralax  URINATION: Pain with urination: No Fully empty bladder: Yes:   Stream: Strong Urgency: No Frequency: average Leakage:  none Pads: No  INTERCOURSE: not active   PREGNANCY: Vaginal deliveries 2 Pain where the episiotomy       OBJECTIVE:  Note: Objective measures were completed at Evaluation unless otherwise noted.  DIAGNOSTIC FINDINGS:  none   COGNITION: Overall cognitive status: Within functional limits for tasks assessed      LUMBAR SPECIAL TESTS:  FABER test: Positive on left and felt it in the left hip   POSTURE: No Significant postural limitations  PELVIC ALIGNMENT:sacrum rotated left  LUMBARAROM/PROM:  A/PROM A/PROM  eval  Flexion Full with pain in rectum and along the gluteals  Extension Decreased by  25%  Right lateral flexion full  Left lateral flexion full  Right rotation  Decreased by 25%  Left rotation Decreased by 25%   (Blank rows = not tested)  LOWER EXTREMITY [id number] hip ROM is full   LOWER EXTREMITY MMT: bilateral hip strength is 4/5   PALPATION:   General  tenderness located in the hip adductors, along the pubic bone, left piriformis, levator ani, sacrum rotated left                External Perineal Exam decreased mobility of the perineal body                             Internal Pelvic Floor tenderness located in the iliococcygeus and coccygeus, and along the coccyx bone  Patient confirms identification and approves PT to assess internal pelvic floor and treatment Yes  PELVIC MMT:   MMT eval  Internal Anal Sphincter 2/5  External Anal Sphincter 2/5  Puborectalis 2/5  (Blank rows = not tested)        TONE: average  PROLAPSE: none  TODAY'S TREATMENT:   12/04/23 Manual: Soft tissue mobilization: To assess for dry needling Manual work to the left gluteals and piriformis to elongate after dry needling Trigger Point Dry-Needling  Treatment instructions: Expect mild to moderate muscle soreness. S/S of pneumothorax if dry needled over a lung field, and to seek immediate medical attention should they occur. Patient verbalized understanding of these instructions and education.  Patient Consent Given: Yes Education handout provided: Yes Muscles treated: left gluteus medius, left gluteus maximus, left piriformis Electrical stimulation performed: No Parameters: N/A Treatment response/outcome: elongation of muscle and trigger point response  Exercises: Stretches/mobility: Diaphragmatic breathing with bulging of the pelvic floor to relax 20 x Figure eight stretch in supine with pressing left knee down the do on the other side piriformis stretch in supine pulling the left leg across back holding 30 sec bilateral Strengthening: Hip flexion isometric on left 10 x each side with some pain on left Quadruped with knee on yoga block  under knee and lift hip up 10 x each side Standing with hands on wall and opposite arm and leg lift with core engagement Supine hip abduction with yellow band 20 x     11/27/23 Manual: Soft tissue mobilization: Soft tissue work to left hip adductor, hamstring and levator ani to lengthen the muscle and decrease pain Exercises: Stretches/mobility: Happy baby holding 30 sec Piriformis stretch holding 30 sec on left in supine and sitting Hip adductor stretch in sitting on edge of the mat and long sitting Educated patient on how to massage her hip adductor and levator ani in reclined position Modified Thomas stretch                                                                                                                                PATIENT EDUCATION: 12/04/23 Education  details: Access Code: [facility], educated patient on how to massage her hip adductors and levator ani in sitting, information on dry needling Person educated: Patient Education method: Explanation, Demonstration, Tactile cues, Verbal cues, and Handouts Education comprehension: verbalized understanding, returned demonstration, verbal cues required, tactile cues required, and needs further education   HOME EXERCISE PROGRAM: 12/04/23 Access Code: [facility] URL: [email or web address] Date: 12/04/2023 Prepared by: Eulis Foster  Exercises - Happy Baby with Pelvic Floor Lengthening  - 1 x daily - 7 x weekly - 1 sets - 1 reps - 30 sec hold - Modified Thomas Stretch  - 1 x daily - 7 x weekly - 1 sets - 1 reps - 30 sec hold - Seated Hip Adductor Stretch on Swiss Ball  - 1 x daily - 7 x weekly - 1 sets - 1 reps - 30 sec hold - Seated Piriformis Stretch with Trunk Bend  - 1 x daily - 7 x weekly - 1 sets - 1 reps - 30 sec hold - Supine Diaphragmatic Breathing  - 2 x daily - 7 x weekly - 1 sets - 15 reps - Quadruped Hip Hike on Foam  - 1 x daily - 3 x weekly - 1 sets - 15 reps - Modified Bird Dog At Wall  - 1 x  daily - 3 x weekly - 2 sets - 10 reps - Hooklying Clamshell with Resistance  - 1 x daily - 3 x weekly - 2 sets - 10 reps Information on dry needling  ASSESSMENT:  CLINICAL IMPRESSION: Patient is a 72 y.o. female who was seen today for physical therapy  treatment for perineal and pelvic pain. Patient reports her pain is a little more due to the weather. She leaked stool yesterday. She is learning core exercises that do not aggravate her back. She will feel tension in the pelvic floor. She is able to perform a pelvic drop today and feel the pelvic floor relax.  Patient will benefit from skilled therapy to improve mobility, reduce stool leakage and reduce her pain to a manageable level.    OBJECTIVE IMPAIRMENTS: decreased coordination, decreased strength, increased fascial restrictions, increased muscle spasms, and pain.   ACTIVITY LIMITATIONS: sitting, continence, toileting, and locomotion level  PARTICIPATION LIMITATIONS: meal prep, driving, shopping, and community activity  PERSONAL FACTORS: Age, Time since onset of injury/illness/exacerbation, and 1-2 comorbidities: Appendectomy; Fibromyalgia; Lyme disease; IBS: TMJ; ICS  are also affecting patient's functional outcome.   REHAB POTENTIAL: Good  CLINICAL DECISION MAKING: Evolving/moderate complexity  EVALUATION COMPLEXITY: Moderate   GOALS: Goals reviewed with patient? Yes  SHORT TERM GOALS: Target date: 12/09/23  Patient understands how to use the pelvic wand in the rectal area to reduce her trigger points.  Baseline: Goal status: Met 11/27/23  2.  Patient understands how to bulge the pelvic floor to lengthen the muscles.  Baseline:  Goal status: Met 12/04/23   LONG TERM GOALS: Target date: 01/08/24  Patient independent with advanced HEP for core and pelvic floor strength.  Baseline:  Goal status: INITIAL  2.  Patient understands ways to manage her pain using her wand, stretches, diaphragmatic breathing and modalities.   Baseline:  Goal status: INITIAL  3.  Patient reports she has not had stool leakage for 2 weeks due to reduction of pain and improved strength.  Baseline:  Goal status: INITIAL  4.  Pelvic floor strength >/= 3/5 holding 15-20 seconds to reduce her continence.  Baseline:  Goal status: INITIAL  5.  Her pelvic and perineal  pain decreased >/= 50% due to reduction of trigger points.  Baseline:  Goal status: INITIAL   PLAN:  PT FREQUENCY: 1x/week  PT DURATION: 8 weeks  PLANNED INTERVENTIONS: 97110-Therapeutic exercises, 97530- Therapeutic activity, [id number]- Neuromuscular re-education, 97140- Manual therapy, 97014- Electrical stimulation (unattended), 97035- Ultrasound, Patient/Family education, Dry Needling, Joint mobilization, Spinal mobilization, Cryotherapy, Moist heat, and Biofeedback  PLAN FOR NEXT SESSION: manual work to the pelvic floor, along the piriformis, see how dry needling was,  correct sacrum and SI joint gapping   Eulis Foster, PT 12/04/23 10:14 AM

## 2023-12-06 DIAGNOSIS — M5030 Other cervical disc degeneration, unspecified cervical region: Secondary | ICD-10-CM | POA: Diagnosis not present

## 2023-12-06 DIAGNOSIS — M5481 Occipital neuralgia: Secondary | ICD-10-CM | POA: Diagnosis not present

## 2023-12-06 DIAGNOSIS — M5430 Sciatica, unspecified side: Secondary | ICD-10-CM | POA: Diagnosis not present

## 2023-12-06 DIAGNOSIS — M797 Fibromyalgia: Secondary | ICD-10-CM | POA: Diagnosis not present

## 2023-12-11 ENCOUNTER — Telehealth: Payer: Self-pay | Admitting: Internal Medicine

## 2023-12-11 NOTE — Telephone Encounter (Signed)
Called and spoke to patient who is calling about her Pitavastatin Magnesium. Patient reports she's been having back pain, body aches/soreness and constipation since starting th medication on 10/7. She continued to take the medication as instructed. She did try cutting it in half to see if that would help and it did not help. She discontinue the Pitavastatin Magnesium about 3 weeks ago. She report the symptoms have not resolved but has decreased. Patient said her and DR [facility] had talked about starting Zetia. She would like to start medication if still recommended by Dr Rennis Golden. She was also referred to pelvis rehab by her GYN doctor due to her pain and is going once per week. Please advise.

## 2023-12-11 NOTE — Telephone Encounter (Signed)
Pt c/o medication issue:  1. Name of Medication: Pitavastatin Magnesium (ZYPITAMAG) 1 MG TABS   2. How are you currently taking this medication (dosage and times per day)? Stopped taking   3. Are you having a reaction (difficulty breathing--STAT)? Yes   4. What is your medication issue? Back pain and soreness all over. Requesting call back for other med options. Please advise.

## 2023-12-12 MED ORDER — EZETIMIBE 10 MG PO TABS: 10.0000 mg | ORAL_TABLET | Freq: Every day | ORAL | 3 refills | Status: DC

## 2023-12-12 NOTE — Telephone Encounter (Signed)
Rx(s) sent to pharmacy electronically.  

## 2023-12-12 NOTE — Telephone Encounter (Signed)
Update sent in MyChart

## 2023-12-12 NOTE — Telephone Encounter (Signed)
Chrystie Nose, MD  You; Cv Div Nl Triage28 minutes ago (1:12 PM)    Ok to start zetia 10 mg daily - but would wait until statin side effects resolve before starting another medication.  Dr. Rexene Edison

## 2023-12-17 ENCOUNTER — Encounter: Payer: Self-pay | Admitting: Pharmacist

## 2023-12-17 NOTE — Progress Notes (Signed)
Pharmacy Quality Measure Review  This patient is appearing on a report for being at risk of failing the adherence measure for cholesterol (statin) medications this calendar year.   Medication: pravastatin 10 mg Last fill date: 8/12 for 90 day supply  Patient has been switched to pitavastatin which was last filled 10/4 for 90 day supply. No further action needed at this time.  Jarrett Ables, PharmD PGY-1 Pharmacy Resident

## 2023-12-19 ENCOUNTER — Encounter: Payer: Self-pay | Admitting: Physical Therapy

## 2023-12-25 ENCOUNTER — Encounter: Payer: Self-pay | Admitting: Physical Therapy

## 2023-12-25 ENCOUNTER — Ambulatory Visit: Payer: PPO | Admitting: Physical Therapy

## 2023-12-25 DIAGNOSIS — M6281 Muscle weakness (generalized): Secondary | ICD-10-CM

## 2023-12-25 DIAGNOSIS — R278 Other lack of coordination: Secondary | ICD-10-CM

## 2023-12-25 DIAGNOSIS — R102 Pelvic and perineal pain: Secondary | ICD-10-CM

## 2023-12-25 NOTE — Therapy (Signed)
OUTPATIENT PHYSICAL THERAPY FEMALE PELVIC TREATMENT   Patient Name: Natalie Erickson MRN: [id number] DOB:February 12, 1951, 72 y.o., female Today's Date: 12/25/2023  END OF SESSION:  PT End of Session - 12/25/23 0848     Visit Number 4    Date for PT Re-Evaluation 01/08/24    Authorization Type [employer]    Authorization - Visit Number 4    Authorization - Number of Visits 10    PT Start Time 0845    PT Stop Time 0925    PT Time Calculation (min) 40 min    Activity Tolerance Patient tolerated treatment well    Behavior During Therapy [facility] for tasks assessed/performed             Past Medical History:  Diagnosis Date   AC (acromioclavicular) joint bone spurs    c 5 and c6 and thoracic   Allergy    food allergies   Anxiety 04/06/2017   Benign thyroid cyst sept 2020 dx   Cataract    bilateral   Cystitis    Depression    Dry eyes 04/06/2017   Elevated BP without diagnosis of hypertension 04/06/2017   lost weight >> BP improved   Fibromyalgia    Floaters    both eyes   Heart murmur    hx of 2 Echocardiograms in [place] (pt was told they were normal)   Hiatal hernia    Hip pain    and groin pain for a while   History of colon polyps    History of echocardiogram    Echo 9/19: mild LVH, EF 60-65, no RWMA, Gr 1 DD, trivial MR/TR, PASP 16   History of kidney stones    History of Lyme disease    Hypercalcemia    causes kidney stones, none recent   IBS (irritable bowel syndrome)    Mixed hyperlipidemia 04/06/2017   intol to statins due to myalgias; never tried Zetia   Occipital neuralgia    Sleep apnea    mild to moderate , uses cpap   TMJ (dislocation of temporomandibular joint)    UTI (urinary tract infection) finishes antibiotic 03-27-2020   Vasomotor rhinitis    Past Surgical History:  Procedure Laterality Date   APPENDECTOMY  1959   COLONOSCOPY  2016   [place]   COLONOSCOPY  2011   in [place]    EVALUATION UNDER ANESTHESIA WITH HEMORRHOIDECTOMY  N/A 04/02/2020   Procedure: ANORECTAL EXAM UNDER ANESTHESIA WITH HEMORRHOIDECTOMY, HEMORRHOIDAL LIGATION;  Surgeon: Karie Soda, MD;  Location: [facility] [place];  Service: General;  Laterality: N/A;  GENERAL AND LOCAL   left ovarin cyst removed  age 80's   grapefruit sized, exp laparotomy   POLYPECTOMY     UPPER GASTROINTESTINAL ENDOSCOPY     UPPER GI ENDOSCOPY     Patient Active Problem List   Diagnosis Date Noted   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   Spondylosis 03/07/2023   Depression, recurrent (HCC) 10/28/2020   Aortic atherosclerosis (HCC) 10/28/2020   Statin intolerance 10/28/2020   OSA on CPAP 10/28/2020   Multiple thyroid nodules 10/28/2020   Vitamin D deficiency 10/28/2020   B12 deficiency 10/28/2020   DDD (degenerative disc disease), cervical 01/08/2020   Bilateral occipital neuralgia 01/08/2020   Somatization disorder 11/14/2018   Posterior vitreous detachment of right eye 09/10/2018   IBS (irritable bowel syndrome) 01/30/2018   Fibromyalgia 06/04/2017   Chronic fatigue 06/04/2017   ANA positive 05/20/2017   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   Interstitial cystitis 05/20/2017  Dyspepsia 05/20/2017   Mixed hyperlipidemia 04/06/2017   Anxiety 04/06/2017    PCP: Ardith Dark, MD  REFERRING PROVIDER: Vick Frees, MD   REFERRING DIAG: R10.2 (ICD-10-CM) - Pelvic and perineal pain   THERAPY DIAG:  Muscle weakness (generalized)  Pelvic pain  Other lack of coordination  Rationale for Evaluation and Treatment: Rehabilitation  ONSET DATE: 2024  SUBJECTIVE:                                                                                                                                                                                           SUBJECTIVE STATEMENT: I have been to Tai chi 2 times. I am off the Statins. My back pain is 40% better.  Fluid intake: Yes: mostly water, tea and everything is decaffeinated     PAIN:  Are you having pain?  Yes NPRS scale: 4/10 Pain location:  low back down left leg into left groin and pubic bone  Pain type: aching, dull, and pressure Pain description: intermittent   Aggravating factors: too much walking, sitting too long,  Relieving factors: laying on back and right side, lidocaine patch  PRECAUTIONS: None  RED FLAGS: None   WEIGHT BEARING RESTRICTIONS: No  FALLS:  Has patient fallen in last 6 months? No  LIVING ENVIRONMENT: Lives with: lives with their family OCCUPATION: retired Engineer, civil (consulting)  PLOF: Independent  PATIENT GOALS: get back to walking and exercise without discomfort  PERTINENT HISTORY:  Appendectomy; Fibromyalgia; Lyme disease; IBS: TMJ; ICS  BOWEL MOVEMENT: Pain with bowel movement: Yes, sometimes and after the bowel movement 5/10 and lasts awhile Type of bowel movement:Type (Bristol Stool Scale) Type 1, 3, 4 , Frequency daily, Strain No, and Splinting no Fully empty rectum: Yes: not all the time Leakage: Yes: if too soft and since her hemorrhoidectomy 2.5 years ago . Stool leakage 2 times per week Pads: No Fiber supplement: Yes: miralax  URINATION: Pain with urination: No Fully empty bladder: Yes:   Stream: Strong Urgency: No Frequency: average Leakage:  none Pads: No  INTERCOURSE: not active   PREGNANCY: Vaginal deliveries 2 Pain where the episiotomy       OBJECTIVE:  Note: Objective measures were completed at Evaluation unless otherwise noted.  DIAGNOSTIC FINDINGS:  none   COGNITION: Overall cognitive status: Within functional limits for tasks assessed      LUMBAR SPECIAL TESTS:  FABER test: Positive on left and felt it in the left hip   POSTURE: No Significant postural limitations  PELVIC ALIGNMENT:sacrum rotated left  LUMBARAROM/PROM:  A/PROM A/PROM  eval  Flexion Full with pain in rectum and along the gluteals  Extension  Decreased by 25%  Right lateral flexion full  Left lateral flexion full  Right rotation Decreased by  25%  Left rotation Decreased by 25%   (Blank rows = not tested)  LOWER EXTREMITY ZOX:[id number] hip ROM is full   LOWER EXTREMITY MMT: bilateral hip strength is 4/5   PALPATION:   General  tenderness located in the hip adductors, along the pubic bone, left piriformis, levator ani, sacrum rotated left                External Perineal Exam decreased mobility of the perineal body                             Internal Pelvic Floor tenderness located in the iliococcygeus and coccygeus, and along the coccyx bone  Patient confirms identification and approves PT to assess internal pelvic floor and treatment Yes  PELVIC MMT:   MMT eval  Internal Anal Sphincter 2/5  External Anal Sphincter 2/5  Puborectalis 2/5  (Blank rows = not tested)        TONE: average  PROLAPSE: none  TODAY'S TREATMENT:   12/25/23 Manual: Soft tissue mobilization: To assess for dry needling Manual work to the left gluteals and piriformis to elongate after dry needling while laying on right side Trigger Point Dry-Needling  Treatment instructions: Expect mild to moderate muscle soreness. S/S of pneumothorax if dry needled over a lung field, and to seek immediate medical attention should they occur. Patient verbalized understanding of these instructions and education.  Patient Consent Given: Yes Education handout provided: Yes Muscles treated: left gluteus medius, left gluteus maximus, left piriformis Electrical stimulation performed: No Parameters: N/A Treatment response/outcome: elongation of muscle and trigger point response Exercises: Stretches/mobility: Piriformis stretch holding 30 sec bil. Supine Single knee to chest holding 30 sec bil.  Strengthening: Supine hip abduction with red band 20 x  Mini bridge 10 x  Supine alternate hip flexion with red band around knees and core engaged 20 x Quadruped lift lower extremity with core engaged 10 x each way with slight coccyx pain Standing with hands  on wall and lift opposite arm and leg 10 x each    12/04/23 Manual: Soft tissue mobilization: To assess for dry needling Manual work to the left gluteals and piriformis to elongate after dry needling Trigger Point Dry-Needling  Treatment instructions: Expect mild to moderate muscle soreness. S/S of pneumothorax if dry needled over a lung field, and to seek immediate medical attention should they occur. Patient verbalized understanding of these instructions and education.  Patient Consent Given: Yes Education handout provided: Yes Muscles treated: left gluteus medius, left gluteus maximus, left piriformis Electrical stimulation performed: No Parameters: N/A Treatment response/outcome: elongation of muscle and trigger point response  Exercises: Stretches/mobility: Diaphragmatic breathing with bulging of the pelvic floor to relax 20 x Figure eight stretch in supine with pressing left knee down the do on the other side piriformis stretch in supine pulling the left leg across back holding 30 sec bilateral Strengthening: Hip flexion isometric on left 10 x each side with some pain on left Quadruped with knee on yoga block under knee and lift hip up 10 x each side Standing with hands on wall and opposite arm and leg lift with core engagement Supine hip abduction with yellow band 20 x     11/27/23 Manual: Soft tissue mobilization: Soft tissue work to left hip adductor, hamstring and levator ani to lengthen the muscle and  decrease pain Exercises: Stretches/mobility: Happy baby holding 30 sec Piriformis stretch holding 30 sec on left in supine and sitting Hip adductor stretch in sitting on edge of the mat and long sitting Educated patient on how to massage her hip adductor and levator ani in reclined position Modified Thomas stretch                                                                                                                                PATIENT  EDUCATION: 12/04/23 Education details: Access Code: [facility], educated patient on how to massage her hip adductors and levator ani in sitting, information on dry needling Person educated: Patient Education method: Explanation, Demonstration, Tactile cues, Verbal cues, and Handouts Education comprehension: verbalized understanding, returned demonstration, verbal cues required, tactile cues required, and needs further education   HOME EXERCISE PROGRAM: 12/04/23 Access Code: [facility] URL: [email or web address] Date: 12/04/2023 Prepared by: Eulis Foster  Exercises - Happy Baby with Pelvic Floor Lengthening  - 1 x daily - 7 x weekly - 1 sets - 1 reps - 30 sec hold - Modified Thomas Stretch  - 1 x daily - 7 x weekly - 1 sets - 1 reps - 30 sec hold - Seated Hip Adductor Stretch on Swiss Ball  - 1 x daily - 7 x weekly - 1 sets - 1 reps - 30 sec hold - Seated Piriformis Stretch with Trunk Bend  - 1 x daily - 7 x weekly - 1 sets - 1 reps - 30 sec hold - Supine Diaphragmatic Breathing  - 2 x daily - 7 x weekly - 1 sets - 15 reps - Quadruped Hip Hike on Foam  - 1 x daily - 3 x weekly - 1 sets - 15 reps - Modified Bird Dog At Wall  - 1 x daily - 3 x weekly - 2 sets - 10 reps - Hooklying Clamshell with Resistance  - 1 x daily - 3 x weekly - 2 sets - 10 reps Information on dry needling  ASSESSMENT:  CLINICAL IMPRESSION: Patient is a 72 y.o. female who was seen today for physical therapy  treatment for perineal and pelvic pain. Patient reports her pain is 40% better.  She leaked stool 1 day since last treatment. Stool leakage is 80% better and she is on a good regime to manage it. She is learning core exercises that do not aggravate her back. She will feel tension in the pelvic floor. She is able to perform a pelvic drop today and feel the pelvic floor relax.  Perineal pain is 70% better. Coccyx bone pain is 75% better. Patient will benefit from skilled therapy to improve mobility, reduce  stool leakage and reduce her pain to a manageable level.    OBJECTIVE IMPAIRMENTS: decreased coordination, decreased strength, increased fascial restrictions, increased muscle spasms, and pain.   ACTIVITY LIMITATIONS: sitting, continence, toileting, and locomotion level  PARTICIPATION LIMITATIONS: meal prep, driving, shopping, and community activity  PERSONAL FACTORS: Age, Time since onset of injury/illness/exacerbation, and 1-2 comorbidities: Appendectomy; Fibromyalgia; Lyme disease; IBS: TMJ; ICS  are also affecting patient's functional outcome.   REHAB POTENTIAL: Good  CLINICAL DECISION MAKING: Evolving/moderate complexity  EVALUATION COMPLEXITY: Moderate   GOALS: Goals reviewed with patient? Yes  SHORT TERM GOALS: Target date: 12/09/23  Patient understands how to use the pelvic wand in the rectal area to reduce her trigger points.  Baseline: Goal status: Met 11/27/23  2.  Patient understands how to bulge the pelvic floor to lengthen the muscles.  Baseline:  Goal status: Met 12/04/23   LONG TERM GOALS: Target date: 01/08/24  Patient independent with advanced HEP for core and pelvic floor strength.  Baseline:  Goal status: INITIAL  2.  Patient understands ways to manage her pain using her wand, stretches, diaphragmatic breathing and modalities.  Baseline:  Goal status: INITIAL  3.  Patient reports she has not had stool leakage for 2 weeks due to reduction of pain and improved strength.  Baseline: stool leakage is 80% better.  Goal status: ongoing 12/25/23  4.  Pelvic floor strength >/= 3/5 holding 15-20 seconds to reduce her continence.  Baseline:  Goal status: INITIAL  5.  Her pelvic and perineal pain decreased >/= 50% due to reduction of trigger points.  Baseline: Perineal pain is 70% better.  Goal status: ongoing 12/25/23   PLAN:  PT FREQUENCY: 1x/week  PT DURATION: 8 weeks  PLANNED INTERVENTIONS: 97110-Therapeutic exercises, 97530- Therapeutic activity,  97112- Neuromuscular re-education, 97140- Manual therapy, 97014- Electrical stimulation (unattended), 97035- Ultrasound, Patient/Family education, Dry Needling, Joint mobilization, Spinal mobilization, Cryotherapy, Moist heat, and Biofeedback  PLAN FOR NEXT SESSION: manual work to the pelvic floor, along the piriformis, see how dry needling was,  correct sacrum and SI joint gapping   Eulis Foster, PT 12/25/23 9:25 AM

## 2024-01-02 NOTE — Patient Instructions (Addendum)
 Please continue using your CPAP regularly. While your insurance requires that you use CPAP at least 4 hours each night on 70% of the nights, I recommend, that you not skip any nights and use it throughout the night if you can. Getting used to CPAP and staying with the treatment long term does take time and patience and discipline. Untreated obstructive sleep apnea when it is moderate to severe can have an adverse impact on cardiovascular health and raise her risk for heart disease, arrhythmias, hypertension, congestive heart failure, stroke and diabetes. Untreated obstructive sleep apnea causes sleep disruption, nonrestorative sleep, and sleep deprivation. This can have an impact on your day to day functioning and cause daytime sleepiness and impairment of cognitive function, memory loss, mood disturbance, and problems focussing. Using CPAP regularly can improve these symptoms.  We will update supply orders, today. We will plan to repeat home sleep study in July or August. Once we get new results, we can place orders for a new machine.   Follow up 31-90 days following set up of new machine

## 2024-01-02 NOTE — Progress Notes (Signed)
 Marland Kitchen

## 2024-01-02 NOTE — Progress Notes (Signed)
 Chief Complaint  Patient presents with   Room 1    Pt is here Alone. Pt states that she needs a new machine. Pt states that she would like to discuss if she needs a new sleep study.    HISTORY OF PRESENT ILLNESS:  01/03/24 ALL: Natalie Erickson returns for follow up for OSA on CPAP. She continues to do well with therapy. She is using CPAP nightly for about 7-8 hours, on average. She does have some dryness during winter months. She will be eligible for new machine 07/2024.     12/14/2022 ALL:  Natalie Erickson returns for follow up for OSA on CPAP. She continues to do well on therapy. She is using CPAP nightly for about 6.5 hours. She denies concerns with machine or supplies. She continues to see [facility] for DDD, fibromyalgia and occipital neuralgia. She reports no longer taking gabapentin . Occipital neuralgia has been stable with rare pain. Fibromyalgia pain waxes and wanes.     12/202/2022 ALL: Natalie Erickson returns for follow up for OSA on CPAP. She continues to do fairly well. She has not used CPAP for as long as she used to due to more IBS symptoms. She feels better when she lies on her left side. She is caring for her aging dog. She is up and doesn't with her at night. She is using a nasal pillow. She feels that she get frustrated with it but not interested in trying a new mask.   ON is well managed. She uses gabapentin  only as needed.     12/10/2020 ALL:  Natalie Erickson is a 73 y.o. female here today for follow up for OSA on CPAP therapy.  She reports that she is doing very well in therapy.  She does feel that it helps with daytime sleepiness.  She is currently seeing [facility], Dr. Cleotilde, who is retiring at the end of the year.  She is treated with low-dose gabapentin  for occipital neuralgia.  She reports that she is doing very well.  She would like to continue follow-up for both sleep and occipital neuralgia with me.  Compliance report dated 11/09/2020 through 12/08/2020 reveals  that she used CPAP 28 of the past 30 days for compliance of 93%.  She used CPAP greater than 4 hours 27 of the past 30 days for compliance of 90%.  Average usage on days used was 6 hours and 55 minutes.  Residual AHI was 0.9 on 4 to 9 cm of water and an EPR of 1.  There was no significant leak noted.   HISTORY (copied from previous note)  Natalie Erickson is a 73 y.o. female here today for follow up of OSA on CPAP.  [name] is doing very well on CPAP therapy.  She is using CPAP nightly.  She does note significant improvement in daytime sleepiness.  She has less fatigue throughout the day.  She notes improvement in occipital neuralgia.  She is currently managed by Dr. Cleotilde, [facility] neurolog[facility].  Her only concern is that occasionally she will wake up at night with what she describes as gas pains.  She is also noted belching.  She is currently using a nasal pillow.  She does have a chinstrap but has not used this.  She is concerned that pressure settings are too high.   Compliance report dated 11/10/2019 through 12/09/2019 revealed that she is using CPAP every night for compliance of 100%.  Every night she used CPAP greater than 4 hours for compliance of  100%.  On average she is using CPAP 7 hours and 49 minutes.  Residual AHI 0.6 on 4 to 9 cm of water.  Pressure in the 95th percentile of 8.2 with maximum of 8.8.  There is no significant leak.   HISTORY: (copied from Dr Obie note on 08/12/2019)   Dear Dr. Prentiss,    I saw your patient, Natalie Erickson, upon your kind request to my sleep clinic today for initial consultation of her sleep disorder, in particular, concern for sleep disordered breathing.  The patient is unaccompanied today.  As you know, Natalie Erickson is a 73 year old right-handed woman with an underlying medical history of irritable bowel syndrome, history of heart murmur, fibromyalgia, depression, anxiety, and hyperlipidemia, who reports snoring and excessive daytime somnolence.  I reviewed your  office note from 07/12/2019.  She has had some morning headaches.  These are dull and achy.  She had seen Dr. Ines in our office in 2018 for headaches. Her Epworth sleepiness score is 9 out of 24, fatigue severity score is 52 out of 63.  She reports that her husband has noted some snorting sounds, these became worse when she was tried on baclofen  for neck pain.  She had a recent C-spine MRI and x-ray in July 2020.  She brought copies of the results for my review.  Her cervical spine x-ray showed multilevel degenerative disc disease.  Her MRI neck showed multilevel cervical spondylosis, most pronounced at C5-6.  She had mild canal stenosis and moderate left foraminal narrowing she was noted to have a goiter.  She is going to see a new orthopedic doctor this week. She reports that she is supposed to have a thyroid  biopsy.  She has had some depressive symptoms.  She reports side effects on antidepressant medications.  She has intermittent restless leg symptoms and takes magnesium for these symptoms as well as cramping.  She does not have nightly restless legs symptoms.  She does have nocturia once or twice per average night.  She lives with her husband.  They live with their son and his family including wife and 2 grandchildren.  She has another son in [place] .  Her son that lives here has sleep apnea and has a CPAP machine.  She reports a bedtime around 930 and rise time between 630 and 7 AM.  Years ago she tried a custom-made bite guard which broke.  She did not think it helped.  She does not believe it was made for snoring or for sleep apnea but more for grinding.  She does not drink caffeine or alcohol.  She does not smoke.    REVIEW OF SYSTEMS: Out of a complete 14 system review of symptoms, the patient complains only of the following symptoms, occipital neuralgia and all other reviewed systems are negative.  ESS: 3/24   ALLERGIES: Allergies  Allergen Reactions   Diltiazem  Shortness Of Breath    Metoprolol  Shortness Of Breath   Statins Tinitus    Achy joints, muscle aches, nerve pain   Chocolate Other (See Comments)    migraine   Cocoa Other (See Comments)    migraine migraine   Codeine Nausea And Vomiting   Cranberry Other (See Comments)    Cystitis   Latex Rash   Monosodium Glutamate Other (See Comments)    Migraine   Penicillins Rash    Has patient had a PCN reaction causing immediate rash, facial/tongue/throat swelling, SOB or lightheadedness with hypotension: Yes Has patient had a PCN reaction causing severe  rash involving mucus membranes or skin necrosis: No Has patient had a PCN reaction that required hospitalization No Has patient had a PCN reaction occurring within the last 10 years: No If all of the above answers are NO, then may proceed with Cephalosporin use.    Antihistamines, Loratadine-Type     Throat swelling   Carvedilol  Other (See Comments)    N&N, SOB , Head ache and joint pain.    Ginger    Gluten Meal    Lexapro  [Escitalopram ]     GI    Montelukast  Other (See Comments)    Throat swelling   Other Swelling    Throat swelling   Prednisone  Swelling    Throat swelling (no difficulty breathing)   Repatha  [Evolocumab ] Other (See Comments)    Worsening IBS, knee pain   Sulfites Other (See Comments)    Nausea and headaches   Vanilla    Whey    Zofran  [Ondansetron  Hcl] Other (See Comments)    Muscle cramps, leg tingling   Azithromycin Other (See Comments) and Diarrhea   Egg-Derived Products Other (See Comments)    States exacerbates her IBS symptoms     HOME MEDICATIONS: Outpatient Medications Prior to Visit  Medication Sig Dispense Refill   acetaminophen  (TYLENOL ) 500 MG tablet Take 1,000 mg by mouth every 6 (six) hours as needed for headache (pain).     AMBULATORY NON FORMULARY MEDICATION Take 1 tablet by mouth as needed. Medication Name: [place]     Cholecalciferol (VITAMIN D ) 2000 units tablet Take 2,000 Units by mouth daily.       co-enzyme Q-10 30 MG capsule Take 100 mg by mouth daily.     ezetimibe  (ZETIA ) 10 MG tablet Take 1 tablet (10 mg total) by mouth daily. 90 tablet 3   Magnesium Glycinate POWD 120 mg by Does not apply route as needed.      omega-3 acid ethyl esters (LOVAZA) 1 g capsule Take 1,000 mg by mouth 2 (two) times daily.     Polyethyl Glycol-Propyl Glycol (SYSTANE ULTRA OP) Apply to eye at bedtime.      polyethylene glycol powder (GLYCOLAX/MIRALAX) 17 GM/SCOOP powder Take 17-34 g by mouth as needed.     baclofen  5 MG TABS Take 1 tablet (5 mg total) by mouth 3 (three) times daily. (Patient not taking: Reported on 01/03/2024)     famotidine  (PEPCID ) 40 MG tablet Take 1 tablet (40 mg total) by mouth daily. (Patient not taking: Reported on 01/03/2024) 30 tablet 5   meloxicam  (MOBIC ) 15 MG tablet Take 1 tablet (15 mg total) by mouth daily. (Patient not taking: Reported on 01/03/2024) 30 tablet 0   Multiple Vitamins-Minerals (PRESERVISION AREDS PO) Take 1 tablet by mouth daily. (Patient not taking: Reported on 01/03/2024)     Pitavastatin  Magnesium (ZYPITAMAG ) 1 MG TABS Take 1 mg by mouth daily. (Patient not taking: Reported on 01/03/2024) 30 tablet 11   No facility-administered medications prior to visit.     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   Cataract    bilateral   Cystitis    Depression    Dry eyes 04/06/2017   Elevated BP without diagnosis of hypertension 04/06/2017   lost weight >> BP improved   Fibromyalgia    Floaters  both eyes   Heart murmur    hx of 2 Echocardiograms in [place]  (pt was told they were normal)   Hiatal hernia    Hip pain    and groin pain for a while   History of colon polyps    History of echocardiogram    Echo 9/19: mild LVH, EF 60-65, no RWMA, Gr 1 DD, trivial MR/TR, PASP 16   History of  kidney stones    History of Lyme disease    Hypercalcemia    causes kidney stones, none recent   IBS (irritable bowel syndrome)    Mixed hyperlipidemia 04/06/2017   intol to statins due to myalgias; never tried Zetia    Occipital neuralgia    Sleep apnea    mild to moderate , uses cpap   TMJ (dislocation of temporomandibular joint)    UTI (urinary tract infection) finishes antibiotic 03-27-2020   Vasomotor rhinitis      PAST SURGICAL HISTORY: Past Surgical History:  Procedure Laterality Date   APPENDECTOMY  1959   COLONOSCOPY  2016   [place]   COLONOSCOPY  2011   in [place]     EVALUATION UNDER ANESTHESIA WITH HEMORRHOIDECTOMY N/A 04/02/2020   Procedure: ANORECTAL EXAM UNDER ANESTHESIA WITH HEMORRHOIDECTOMY, HEMORRHOIDAL LIGATION;  Surgeon: Sheldon Standing, MD;  Location: [facility];  Service: General;  Laterality: N/A;  GENERAL AND LOCAL   left ovarin cyst removed  age 57's   grapefruit sized, exp laparotomy   POLYPECTOMY     UPPER GASTROINTESTINAL ENDOSCOPY     UPPER GI ENDOSCOPY       FAMILY HISTORY: Family History  Problem Relation Age of Onset   Healthy Mother    Hypertension Mother    Healthy Father    Lung cancer Father    Breast cancer Sister    Diabetes Paternal Grandmother    Colon cancer Neg Hx    Stomach cancer Neg Hx    Heart attack Neg Hx    Heart failure Neg Hx    Pancreatic cancer Neg Hx    Colon polyps Neg Hx    Esophageal cancer Neg Hx    Rectal cancer Neg Hx      SOCIAL HISTORY: Social History   Socioeconomic History   Marital status: Married    Spouse name: Not on file   Number of children: 2   Years of education: RN   Highest education level: Not on file  Occupational History   Occupation: Retired  Tobacco Use   Smoking status: Never   Smokeless tobacco: Never  Vaping Use   Vaping status: Never Used  Substance and Sexual Activity   Alcohol use: No   Drug use: No   Sexual activity: Yes  Other Topics Concern    Not on file  Social History Narrative   ** Merged History Encounter **       Lives at home w/ her husband and family Right-handed Caffeine: none since March 2018 2 sons 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   Social Drivers of Health   Financial Resource Strain: Low Risk  (12/06/2023)   Received from [facility]   Overall Financial Resource Strain (CARDIA)    Difficulty of Paying Living Expenses: Not very hard  Food Insecurity: No Food Insecurity (12/06/2023)   [facility]   Hunger Vital Sign    Worried About Running Out of Food in the Last Year: Never true    Ran Out of Food in the  Last Year: Never true  Transportation Needs: No Transportation Needs (12/06/2023)   Received from [facility]   PRAPARE - Transportation    Lack of Transportation (Medical): No    Lack of Transportation (Non-Medical): No  Physical Activity: Not on file  Stress: Not on file  Social Connections: Unknown (05/09/2022)   Received from [facility]   Social Network    Social Network: Not on file  Intimate Partner Violence: Unknown (03/31/2022)   Received from [facility], [facility]   HITS    Physically Hurt: Not on file    Insult or Talk Down To: Not on file    Threaten Physical Harm: Not on file    Scream or Curse: Not on file      PHYSICAL EXAM  Vitals:   01/03/24 0945  BP: (!) 112/50  Pulse: 65  Weight: 166 lb 8 oz (75.5 kg)  Height: 5' 1 (1.549 m)      Body mass index is 31.46 kg/m.   Generalized: Well developed, in no acute distress  Cardiology: normal rate and rhythm, no murmur auscultated  Respiratory: clear to auscultation bilaterally    Neurological examination  Mentation: Alert oriented to time, place, history taking. Follows all commands speech and language fluent Cranial nerve II-XII: Pupils were equal round reactive to light. Extraocular movements were full, visual field were full on confrontational test. Facial  sensation and strength were normal. Head turning and shoulder shrug  were normal and symmetric. Motor: The motor testing reveals 5 over 5 strength of all 4 extremities. Good symmetric motor tone is noted throughout.  Gait and station: Gait is normal.     DIAGNOSTIC DATA (LABS, IMAGING, TESTING) - I reviewed patient records, labs, notes, testing and imaging myself where available.  Lab Results  Component Value Date   WBC 8.8 02/13/2023   HGB 13.7 02/13/2023   HCT 40.6 02/13/2023   MCV 94.3 02/13/2023   PLT 363.0 02/13/2023      Component Value Date/Time   NA 140 02/13/2023 0839   K 3.9 02/13/2023 0839   CL 103 02/13/2023 0839   CO2 28 02/13/2023 0839   GLUCOSE 95 02/13/2023 0839   BUN 16 02/13/2023 0839   CREATININE 0.83 02/13/2023 0839   CREATININE 0.70 10/28/2020 0927   CALCIUM  9.9 02/13/2023 0839   PROT 7.2 02/13/2023 0839   PROT 7.1 07/10/2017 1058   ALBUMIN 4.4 02/13/2023 0839   AST 17 02/13/2023 0839   ALT 13 02/13/2023 0839   ALKPHOS 116 02/13/2023 0839   BILITOT 0.5 02/13/2023 0839   GFRNONAA >60 08/28/2019 1846   GFRAA >60 08/28/2019 1846   Lab Results  Component Value Date   CHOL 207 (H) 09/22/2023   HDL 59 09/22/2023   LDLCALC 129 (H) 09/22/2023   TRIG 107 09/22/2023   CHOLHDL 3.5 09/22/2023   Lab Results  Component Value Date   HGBA1C 5.4 10/29/2021   Lab Results  Component Value Date   VITAMINB12 301 02/13/2023   Lab Results  Component Value Date   TSH 0.57 02/13/2023        No data to display           ASSESSMENT AND PLAN  73 y.o. year old female  has a past medical history of AC (acromioclavicular) joint bone spurs, Allergy, Anxiety (04/06/2017), Benign thyroid  cyst (sept 2020 dx), Cataract, Cystitis, Depression, Dry eyes (04/06/2017), Elevated BP without diagnosis of hypertension (04/06/2017), Fibromyalgia, Floaters, Heart murmur, Hiatal hernia, Hip pain, History  of colon polyps, History of echocardiogram, History of kidney stones,  History of Lyme disease, Hypercalcemia, IBS (irritable bowel syndrome), Mixed hyperlipidemia (04/06/2017), Occipital neuralgia, Sleep apnea, TMJ (dislocation of temporomandibular joint), UTI (urinary tract infection) (finishes antibiotic 03-27-2020), and Vasomotor rhinitis. here with    OSA on CPAP - Plan: For home use only DME continuous positive airway pressure (CPAP)  Shardea is doing very well from a sleep apnea standpoint.  Compliance report reveals excellent compliance.  She was encouraged to continue using CPAP nightly and for greater than 4 hours each night. Supply orders updated. We will plan to repeat HST 06/2024 and order new machine pending results. She will continue close follow up with [facility]. We will see her back 31-90 days following set up of new CPAP.  She verbalizes understanding and agreement with this plan.    Orders Placed This Encounter  Procedures   For home use only DME continuous positive airway pressure (CPAP)    Heated Humidity with all supplies as needed    Length of Need:   Lifetime    Patient has OSA or probable OSA:   Yes    Is the patient currently using CPAP in the home:   Yes    Settings:   Other see comments    CPAP supplies needed:   Mask, headgear, cushions, filters, heated tubing and water chamber      Greig Forbes, MSN, FNP-C 01/03/2024, 10:10 AM  [facility] [address], [address] [place], [place] [address] [phone]

## 2024-01-03 ENCOUNTER — Ambulatory Visit (INDEPENDENT_AMBULATORY_CARE_PROVIDER_SITE_OTHER): Payer: No Typology Code available for payment source | Admitting: Family Medicine

## 2024-01-03 ENCOUNTER — Encounter: Payer: Self-pay | Admitting: Family Medicine

## 2024-01-03 VITALS — BP 112/50 | HR 65 | Ht 61.0 in | Wt 166.5 lb

## 2024-01-03 DIAGNOSIS — G4733 Obstructive sleep apnea (adult) (pediatric): Secondary | ICD-10-CM | POA: Diagnosis not present

## 2024-01-04 ENCOUNTER — Encounter: Payer: Self-pay | Admitting: Physical Therapy

## 2024-01-04 ENCOUNTER — Encounter
Payer: No Typology Code available for payment source | Attending: Obstetrics and Gynecology | Admitting: Physical Therapy

## 2024-01-04 DIAGNOSIS — R278 Other lack of coordination: Secondary | ICD-10-CM | POA: Insufficient documentation

## 2024-01-04 DIAGNOSIS — M6281 Muscle weakness (generalized): Secondary | ICD-10-CM | POA: Diagnosis not present

## 2024-01-04 DIAGNOSIS — M5459 Other low back pain: Secondary | ICD-10-CM | POA: Insufficient documentation

## 2024-01-04 DIAGNOSIS — R102 Pelvic and perineal pain: Secondary | ICD-10-CM | POA: Insufficient documentation

## 2024-01-04 NOTE — Therapy (Signed)
 OUTPATIENT PHYSICAL THERAPY FEMALE PELVIC TREATMENT   Patient Name: Natalie Erickson MRN: [id number] DOB:September 11, 1951, 73 y.o., female Today's Date: 01/04/2024  END OF SESSION:  PT End of Session - 01/04/24 0836     Visit Number 5    Date for PT Re-Evaluation 03/04/24    Authorization Type [employer]    Authorization - Visit Number 5    Authorization - Number of Visits 10    PT Start Time [phone]    PT Stop Time 0920    PT Time Calculation (min) 45 min    Activity Tolerance Patient tolerated treatment well    Behavior During Therapy [facility] for tasks assessed/performed             Past Medical History:  Diagnosis Date   AC (acromioclavicular) joint bone spurs    c 5 and c6 and thoracic   Allergy    food allergies   Anxiety 04/06/2017   Benign thyroid  cyst sept 2020 dx   Cataract    bilateral   Cystitis    Depression    Dry eyes 04/06/2017   Elevated BP without diagnosis of hypertension 04/06/2017   lost weight >> BP improved   Fibromyalgia    Floaters    both eyes   Heart murmur    hx of 2 Echocardiograms in [place]  (pt was told they were normal)   Hiatal hernia    Hip pain    and groin pain for a while   History of colon polyps    History of echocardiogram    Echo 9/19: mild LVH, EF 60-65, no RWMA, Gr 1 DD, trivial MR/TR, PASP 16   History of kidney stones    History of Lyme disease    Hypercalcemia    causes kidney stones, none recent   IBS (irritable bowel syndrome)    Mixed hyperlipidemia 04/06/2017   intol to statins due to myalgias; never tried Zetia    Occipital neuralgia    Sleep apnea    mild to moderate , uses cpap   TMJ (dislocation of temporomandibular joint)    UTI (urinary tract infection) finishes antibiotic 03-27-2020   Vasomotor rhinitis    Past Surgical History:  Procedure Laterality Date   APPENDECTOMY  1959   COLONOSCOPY  2016   [place]   COLONOSCOPY  2011   in [place]     EVALUATION UNDER ANESTHESIA WITH HEMORRHOIDECTOMY N/A  04/02/2020   Procedure: ANORECTAL EXAM UNDER ANESTHESIA WITH HEMORRHOIDECTOMY, HEMORRHOIDAL LIGATION;  Surgeon: Sheldon Standing, MD;  Location: [facility];  Service: General;  Laterality: N/A;  GENERAL AND LOCAL   left ovarin cyst removed  age 33's   grapefruit sized, exp laparotomy   POLYPECTOMY     UPPER GASTROINTESTINAL ENDOSCOPY     UPPER GI ENDOSCOPY     Patient Active Problem List   Diagnosis Date Noted   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   Spondylosis 03/07/2023   Depression, recurrent (HCC) 10/28/2020   Aortic atherosclerosis (HCC) 10/28/2020   Statin intolerance 10/28/2020   OSA on CPAP 10/28/2020   Multiple thyroid  nodules 10/28/2020   Vitamin D  deficiency 10/28/2020   B12 deficiency 10/28/2020   DDD (degenerative disc disease), cervical 01/08/2020   Bilateral occipital neuralgia 01/08/2020   Somatization disorder 11/14/2018   Posterior vitreous detachment of right eye 09/10/2018   IBS (irritable bowel syndrome) 01/30/2018   Fibromyalgia 06/04/2017   Chronic fatigue 06/04/2017   ANA positive 05/20/2017   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   Interstitial cystitis 05/20/2017  Dyspepsia 05/20/2017   Mixed hyperlipidemia 04/06/2017   Anxiety 04/06/2017    PCP: Kennyth Worth HERO, MD  REFERRING PROVIDER: Linnell Devere BRAVO, MD   REFERRING DIAG: R10.2 (ICD-10-CM) - Pelvic and perineal pain   THERAPY DIAG:  Muscle weakness (generalized) - Plan: PT plan of care cert/re-cert  Pelvic pain - Plan: PT plan of care cert/re-cert  Other lack of coordination - Plan: PT plan of care cert/re-cert  Other low back pain - Plan: PT plan of care cert/re-cert  Rationale for Evaluation and Treatment: Rehabilitation  ONSET DATE: 2024  SUBJECTIVE:                                                                                                                                                                                           SUBJECTIVE STATEMENT: I am sore today. I had 1  little stool leakage since last visit. Leakage is better with diet and using the psyllum. I get sore in the vaginal area when I do the kegels.  Fluid intake: Yes: mostly water, tea and everything is decaffeinated     PAIN:  Are you having pain? Yes NPRS scale: 7/10 Pain location:  low back down left leg into left groin and pubic bone  Pain type: aching, dull, and pressure Pain description: intermittent   Aggravating factors: too much walking, sitting too long,  Relieving factors: laying on back and right side, lidocaine  patch  PRECAUTIONS: None  RED FLAGS: None   WEIGHT BEARING RESTRICTIONS: No  FALLS:  Has patient fallen in last 6 months? No  LIVING ENVIRONMENT: Lives with: lives with their family OCCUPATION: retired engineer, civil (consulting)  PLOF: Independent  PATIENT GOALS: get back to walking and exercise without discomfort  PERTINENT HISTORY:  Appendectomy; Fibromyalgia; Lyme disease; IBS: TMJ; ICS  BOWEL MOVEMENT: Pain with bowel movement: Yes, sometimes and after the bowel movement 5/10 and lasts awhile Type of bowel movement:Type (Bristol Stool Scale) Type 1, 3, 4 , Frequency daily, Strain No, and Splinting no Fully empty rectum: Yes: not all the time Leakage: Yes: if too soft and since her hemorrhoidectomy 2.5 years ago . Stool leakage 2 times per week Pads: No Fiber supplement: Yes: miralax  URINATION: Pain with urination: No Fully empty bladder: Yes:   Stream: Strong Urgency: No Frequency: average Leakage:  none Pads: No  INTERCOURSE: not active   PREGNANCY: Vaginal deliveries 2 Pain where the episiotomy       OBJECTIVE:  Note: Objective measures were completed at Evaluation unless otherwise noted.  DIAGNOSTIC FINDINGS:  none   COGNITION: Overall cognitive status: Within functional limits for tasks assessed  LUMBAR SPECIAL TESTS:  FABER test: Positive on left and felt it in the left hip   POSTURE: No Significant postural limitations  PELVIC  ALIGNMENT:sacrum rotated left  LUMBARAROM/PROM:  A/PROM A/PROM  eval 01/04/24  Flexion Full with pain in rectum and along the gluteals Full without rectal pain  Extension Decreased by 25% Decreased by 25%  Right lateral flexion full full  Left lateral flexion full full  Right rotation Decreased by 25% Decreased by 25%  Left rotation Decreased by 25% Decreased by 25%   (Blank rows = not tested)  LOWER EXTREMITY MNF:apojuzmjo hip ROM is full   LOWER EXTREMITY MMT: bilateral hip strength is 4/5 01/04/24 bilateral hip abduction, extension 4/5   PALPATION:   General  tenderness located in the hip adductors, along the pubic bone, left piriformis, levator ani, sacrum rotated left                External Perineal Exam decreased mobility of the perineal body                             Internal Pelvic Floor tenderness located in the iliococcygeus and coccygeus, and along the coccyx bone  Patient confirms identification and approves PT to assess internal pelvic floor and treatment Yes  PELVIC MMT:   MMT eval 01/04/24  Internal Anal Sphincter 2/5 3/5 holding 20 sec  External Anal Sphincter 2/5 3/5 holding 20 sec  Puborectalis 2/5 3/5 holding 20 sec  (Blank rows = not tested)        TONE: average  PROLAPSE: none  TODAY'S TREATMENT:   01/04/24 Manual: Internal pelvic floor techniques: No emotional/communication barriers or cognitive limitation. Patient is motivated to learn. Patient understands and agrees with treatment goals and plan. PT explains patient will be examined in standing, sitting, and lying down to see how their muscles and joints work. When they are ready, they will be asked to remove their underwear so PT can examine their perineum. The patient is also given the option of providing their own chaperone as one is not provided in our facility. The patient also has the right and is explained the right to defer or refuse any part of the evaluation or treatment including the  internal exam. With the patient's consent, PT will use one gloved finger to gently assess the muscles of the pelvic floor, seeing how well it contracts and relaxes and if there is muscle symmetry. After, the patient will get dressed and PT and patient will discuss exam findings and plan of care. PT and patient discuss plan of care, schedule, attendance policy and HEP activities.  Therapist finger in the rectum performing manual work on the anterior rectum, along the puborectalis, iliococcygeus, obturator internist to reduce tightness and pain Strengthening: Plank lift alternate extremity 20 x Standing holding onto wall hip ER 10 x each side Therapist finger in the rectum working on pelvic floor contraction holding 20 seconds  12/25/23 Manual: Soft tissue mobilization: To assess for dry needling Manual work to the left gluteals and piriformis to elongate after dry needling while laying on right side Trigger Point Dry-Needling  Treatment instructions: Expect mild to moderate muscle soreness. S/S of pneumothorax if dry needled over a lung field, and to seek immediate medical attention should they occur. Patient verbalized understanding of these instructions and education.  Patient Consent Given: Yes Education handout provided: Yes Muscles treated: left gluteus medius, left gluteus maximus, left piriformis Electrical stimulation  performed: No Parameters: N/A Treatment response/outcome: elongation of muscle and trigger point response Exercises: Stretches/mobility: Piriformis stretch holding 30 sec bil. Supine Single knee to chest holding 30 sec bil.  Strengthening: Supine hip abduction with red band 20 x  Mini bridge 10 x  Supine alternate hip flexion with red band around knees and core engaged 20 x Quadruped lift lower extremity with core engaged 10 x each way with slight coccyx pain Standing with hands on wall and lift opposite arm and leg 10 x each    12/04/23 Manual: Soft tissue  mobilization: To assess for dry needling Manual work to the left gluteals and piriformis to elongate after dry needling Trigger Point Dry-Needling  Treatment instructions: Expect mild to moderate muscle soreness. S/S of pneumothorax if dry needled over a lung field, and to seek immediate medical attention should they occur. Patient verbalized understanding of these instructions and education.  Patient Consent Given: Yes Education handout provided: Yes Muscles treated: left gluteus medius, left gluteus maximus, left piriformis Electrical stimulation performed: No Parameters: N/A Treatment response/outcome: elongation of muscle and trigger point response  Exercises: Stretches/mobility: Diaphragmatic breathing with bulging of the pelvic floor to relax 20 x Figure eight stretch in supine with pressing left knee down the do on the other side piriformis stretch in supine pulling the left leg across back holding 30 sec bilateral Strengthening: Hip flexion isometric on left 10 x each side with some pain on left Quadruped with knee on yoga block under knee and lift hip up 10 x each side Standing with hands on wall and opposite arm and leg lift with core engagement Supine hip abduction with yellow band 20 x                                                                                                                               PATIENT EDUCATION: 12/04/23 Education details: Access Code: [facility], educated patient on how to massage her hip adductors and levator ani in sitting, information on dry needling Person educated: Patient Education method: Programmer, Multimedia, Demonstration, Tactile cues, Verbal cues, and Handouts Education comprehension: verbalized understanding, returned demonstration, verbal cues required, tactile cues required, and needs further education   HOME EXERCISE PROGRAM: 12/04/23 Access Code: [facility] URL: [email or web address] Date: 12/04/2023 Prepared by:  Channing Pereyra  Exercises - Happy Baby with Pelvic Floor Lengthening  - 1 x daily - 7 x weekly - 1 sets - 1 reps - 30 sec hold - Modified Thomas Stretch  - 1 x daily - 7 x weekly - 1 sets - 1 reps - 30 sec hold - Seated Hip Adductor Stretch on Swiss Ball  - 1 x daily - 7 x weekly - 1 sets - 1 reps - 30 sec hold - Seated Piriformis Stretch with Trunk Bend  - 1 x daily - 7 x weekly - 1 sets - 1 reps - 30 sec hold - Supine Diaphragmatic Breathing  - 2 x daily -  7 x weekly - 1 sets - 15 reps - Quadruped Hip Hike on Foam  - 1 x daily - 3 x weekly - 1 sets - 15 reps - Modified Bird Dog At Wall  - 1 x daily - 3 x weekly - 2 sets - 10 reps - Hooklying Clamshell with Resistance  - 1 x daily - 3 x weekly - 2 sets - 10 reps Information on dry needling  ASSESSMENT:  CLINICAL IMPRESSION: Patient is a 73 y.o. female who was seen today for physical therapy  treatment for perineal and pelvic pain. Patient reports her pain is 40% better.  She leaked stool 1 day since last treatment. Stool leakage is 80% better and she is on a good regime to manage it. Rectal strength is 3/5 holding 20 seconds.  She is able to perform a pelvic drop today and feel the pelvic floor relax.  Perineal pain is 70% better. Coccyx bone pain is 75% better. Patient will benefit from skilled therapy to improve mobility, reduce stool leakage and reduce her pain to a manageable level.    OBJECTIVE IMPAIRMENTS: decreased coordination, decreased strength, increased fascial restrictions, increased muscle spasms, and pain.   ACTIVITY LIMITATIONS: sitting, continence, toileting, and locomotion level  PARTICIPATION LIMITATIONS: meal prep, driving, shopping, and community activity  PERSONAL FACTORS: Age, Time since onset of injury/illness/exacerbation, and 1-2 comorbidities: Appendectomy; Fibromyalgia; Lyme disease; IBS: TMJ; ICS  are also affecting patient's functional outcome.   REHAB POTENTIAL: Good  CLINICAL DECISION MAKING:  Evolving/moderate complexity  EVALUATION COMPLEXITY: Moderate   GOALS: Goals reviewed with patient? Yes  SHORT TERM GOALS: Target date: 12/09/23  Patient understands how to use the pelvic wand in the rectal area to reduce her trigger points.  Baseline: Goal status: Met 11/27/23  2.  Patient understands how to bulge the pelvic floor to lengthen the muscles.  Baseline:  Goal status: Met 12/04/23   LONG TERM GOALS: Target date: 03/04/24  Patient independent with advanced HEP for core and pelvic floor strength.  Baseline:  Goal status: Ongoing 01/04/24  2.  Patient understands ways to manage her pain using her wand, stretches, diaphragmatic breathing and modalities.  Baseline:  Goal status: ongoing 01/04/24  3.  Patient reports she has not had stool leakage for 2 weeks due to reduction of pain and improved strength.  Baseline: stool leakage is 80% better.  Goal status: ongoing 01/04/24  4.  Pelvic floor strength >/= 3/5 holding 15-20 seconds to reduce her continence.  Baseline:  Goal status: ongoing 01/04/24  5.  Her pelvic and perineal pain decreased >/= 50% due to reduction of trigger points.  Baseline: Perineal pain is 70% better.  Goal status: ongoing 01/04/24   PLAN:  PT FREQUENCY: 1x/week  PT DURATION: 8 weeks  PLANNED INTERVENTIONS: 97110-Therapeutic exercises, 97530- Therapeutic activity, 97112- Neuromuscular re-education, 97140- Manual therapy, 97014- Electrical stimulation (unattended), 97035- Ultrasound, Patient/Family education, Dry Needling, Joint mobilization, Spinal mobilization, Cryotherapy, Moist heat, and Biofeedback  PLAN FOR NEXT SESSION: manual work to the pelvic floor, along the piriformis, see how dry needling was,  correct sacrum and SI joint gapping   Channing Pereyra, PT 01/04/24 9:27 AM

## 2024-01-17 ENCOUNTER — Encounter: Payer: Self-pay | Admitting: Physical Therapy

## 2024-01-17 ENCOUNTER — Ambulatory Visit
Payer: No Typology Code available for payment source | Attending: Obstetrics and Gynecology | Admitting: Physical Therapy

## 2024-01-17 DIAGNOSIS — M6281 Muscle weakness (generalized): Secondary | ICD-10-CM | POA: Insufficient documentation

## 2024-01-17 DIAGNOSIS — R102 Pelvic and perineal pain: Secondary | ICD-10-CM | POA: Diagnosis not present

## 2024-01-17 DIAGNOSIS — R278 Other lack of coordination: Secondary | ICD-10-CM | POA: Diagnosis not present

## 2024-01-17 NOTE — Therapy (Signed)
OUTPATIENT PHYSICAL THERAPY FEMALE PELVIC TREATMENT   Patient Name: Natalie Erickson MRN: [id number] DOB:05-14-1951, 73 y.o., female Today's Date: 01/17/2024  END OF SESSION:  PT End of Session - 01/17/24 1143     Visit Number 6    Date for PT Re-Evaluation 03/04/24    Authorization Type [employer]    Authorization - Visit Number 6    Authorization - Number of Visits 10    PT Start Time 1145    PT Stop Time 1225    PT Time Calculation (min) 40 min    Activity Tolerance Patient tolerated treatment well    Behavior During Therapy WFL for tasks assessed/performed             Past Medical History:  Diagnosis Date   AC (acromioclavicular) joint bone spurs    c 5 and c6 and thoracic   Allergy    food allergies   Anxiety 04/06/2017   Benign thyroid cyst sept 2020 dx   Cataract    bilateral   Cystitis    Depression    Dry eyes 04/06/2017   Elevated BP without diagnosis of hypertension 04/06/2017   lost weight >> BP improved   Fibromyalgia    Floaters    both eyes   Heart murmur    hx of 2 Echocardiograms in [place] (pt was told they were normal)   Hiatal hernia    Hip pain    and groin pain for a while   History of colon polyps    History of echocardiogram    Echo 9/19: mild LVH, EF 60-65, no RWMA, Gr 1 DD, trivial MR/TR, PASP 16   History of kidney stones    History of Lyme disease    Hypercalcemia    causes kidney stones, none recent   IBS (irritable bowel syndrome)    Mixed hyperlipidemia 04/06/2017   intol to statins due to myalgias; never tried Zetia   Occipital neuralgia    Sleep apnea    mild to moderate , uses cpap   TMJ (dislocation of temporomandibular joint)    UTI (urinary tract infection) finishes antibiotic 03-27-2020   Vasomotor rhinitis    Past Surgical History:  Procedure Laterality Date   APPENDECTOMY  1959   COLONOSCOPY  2016   [facility]   COLONOSCOPY  2011   in [place]    EVALUATION UNDER ANESTHESIA WITH HEMORRHOIDECTOMY N/A  04/02/2020   Procedure: ANORECTAL EXAM UNDER ANESTHESIA WITH HEMORRHOIDECTOMY, HEMORRHOIDAL LIGATION;  Surgeon: Karie Soda, MD;  Location: [facility];  Service: General;  Laterality: N/A;  GENERAL AND LOCAL   left ovarin cyst removed  age 19's   grapefruit sized, exp laparotomy   POLYPECTOMY     UPPER GASTROINTESTINAL ENDOSCOPY     UPPER GI ENDOSCOPY     Patient Active Problem List   Diagnosis Date Noted   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   Spondylosis 03/07/2023   Depression, recurrent (HCC) 10/28/2020   Aortic atherosclerosis (HCC) 10/28/2020   Statin intolerance 10/28/2020   OSA on CPAP 10/28/2020   Multiple thyroid nodules 10/28/2020   Vitamin D deficiency 10/28/2020   B12 deficiency 10/28/2020   DDD (degenerative disc disease), cervical 01/08/2020   Bilateral occipital neuralgia 01/08/2020   Somatization disorder 11/14/2018   Posterior vitreous detachment of right eye 09/10/2018   IBS (irritable bowel syndrome) 01/30/2018   Fibromyalgia 06/04/2017   Chronic fatigue 06/04/2017   ANA positive 05/20/2017   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   Interstitial cystitis 05/20/2017  Dyspepsia 05/20/2017   Mixed hyperlipidemia 04/06/2017   Anxiety 04/06/2017    PCP: Ardith Dark, MD  REFERRING PROVIDER: Vick Frees, MD   REFERRING DIAG: R10.2 (ICD-10-CM) - Pelvic and perineal pain   THERAPY DIAG:  Muscle weakness (generalized)  Pelvic pain  Other lack of coordination  Rationale for Evaluation and Treatment: Rehabilitation  ONSET DATE: 2024  SUBJECTIVE:                                                                                                                                                                                           SUBJECTIVE STATEMENT: I have to watch my diet. My back is doing better. I am still leaking. The statins have increased my pain. I get stool urgency. I am doing the exercises. I still have the achyness in my hips. No  rectal pain since the manual work. I still have some pubic pain and left groin.    Fluid intake: Yes: mostly water, tea and everything is decaffeinated     PAIN:  Are you having pain? Yes NPRS scale: 5/10 Pain location:  low back down left leg into left groin and pubic bone  Pain type: aching, dull, and pressure Pain description: intermittent   Aggravating factors: too much walking, sitting too long,  Relieving factors: laying on back and right side, lidocaine patch  PRECAUTIONS: None  RED FLAGS: None   WEIGHT BEARING RESTRICTIONS: No  FALLS:  Has patient fallen in last 6 months? No  LIVING ENVIRONMENT: Lives with: lives with their family OCCUPATION: retired Engineer, civil (consulting)  PLOF: Independent  PATIENT GOALS: get back to walking and exercise without discomfort  PERTINENT HISTORY:  Appendectomy; Fibromyalgia; Lyme disease; IBS: TMJ; ICS  BOWEL MOVEMENT: Pain with bowel movement: Yes, sometimes and after the bowel movement 5/10 and lasts awhile Type of bowel movement:Type (Bristol Stool Scale) Type 1, 3, 4 , Frequency daily, Strain No, and Splinting no Fully empty rectum: Yes: not all the time Leakage: Yes: if too soft and since her hemorrhoidectomy 2.5 years ago . Stool leakage 2 times per week Pads: No Fiber supplement: Yes: miralax  URINATION: Pain with urination: No Fully empty bladder: Yes:   Stream: Strong Urgency: No Frequency: average Leakage:  none Pads: No  INTERCOURSE: not active   PREGNANCY: Vaginal deliveries 2 Pain where the episiotomy       OBJECTIVE:  Note: Objective measures were completed at Evaluation unless otherwise noted.  DIAGNOSTIC FINDINGS:  none   COGNITION: Overall cognitive status: Within functional limits for tasks assessed      LUMBAR SPECIAL TESTS:  FABER test: Positive on left and felt  it in the left hip   POSTURE: No Significant postural limitations  PELVIC ALIGNMENT:sacrum rotated left  LUMBARAROM/PROM:  A/PROM  A/PROM  eval 01/04/24  Flexion Full with pain in rectum and along the gluteals Full without rectal pain  Extension Decreased by 25% Decreased by 25%  Right lateral flexion full full  Left lateral flexion full full  Right rotation Decreased by 25% Decreased by 25%  Left rotation Decreased by 25% Decreased by 25%   (Blank rows = not tested)  LOWER EXTREMITY ZOX:[id number] hip ROM is full   LOWER EXTREMITY MMT: bilateral hip strength is 4/5 01/04/24 bilateral hip abduction, extension 4/5   PALPATION:   General  tenderness located in the hip adductors, along the pubic bone, left piriformis, levator ani, sacrum rotated left                External Perineal Exam decreased mobility of the perineal body                             Internal Pelvic Floor tenderness located in the iliococcygeus and coccygeus, and along the coccyx bone  Patient confirms identification and approves PT to assess internal pelvic floor and treatment Yes  PELVIC MMT:   MMT eval 01/04/24  Internal Anal Sphincter 2/5 3/5 holding 20 sec  External Anal Sphincter 2/5 3/5 holding 20 sec  Puborectalis 2/5 3/5 holding 20 sec  (Blank rows = not tested)        TONE: average  PROLAPSE: none  TODAY'S TREATMENT:   01/17/24 Exercises: Stretches/mobility: Sitting piriformis stretch holding 30 sec bil.  Sitting happy baby stretch holding 1 min Sitting trunk rotation holding 30 sec bil.  Sitting hip flexor stretch holding 30 sec bil.  Strengthening: Sit contracting the pelvic floor holding 20 sec 5 times Standing plank at wall and lift alternate shoulder and legs with core engaged Standing hip abduction 10 x each side; tried with blue loop but increased hip pain  01/04/24 Manual: Internal pelvic floor techniques: No emotional/communication barriers or cognitive limitation. Patient is motivated to learn. Patient understands and agrees with treatment goals and plan. PT explains patient will be examined in standing,  sitting, and lying down to see how their muscles and joints work. When they are ready, they will be asked to remove their underwear so PT can examine their perineum. The patient is also given the option of providing their own chaperone as one is not provided in our facility. The patient also has the right and is explained the right to defer or refuse any part of the evaluation or treatment including the internal exam. With the patient's consent, PT will use one gloved finger to gently assess the muscles of the pelvic floor, seeing how well it contracts and relaxes and if there is muscle symmetry. After, the patient will get dressed and PT and patient will discuss exam findings and plan of care. PT and patient discuss plan of care, schedule, attendance policy and HEP activities.  Therapist finger in the rectum performing manual work on the anterior rectum, along the puborectalis, iliococcygeus, obturator internist to reduce tightness and pain Strengthening: Plank lift alternate extremity 20 x Standing holding onto wall hip ER 10 x each side Therapist finger in the rectum working on pelvic floor contraction holding 20 seconds  12/25/23 Manual: Soft tissue mobilization: To assess for dry needling Manual work to the left gluteals and piriformis to elongate after dry needling while  laying on right side Trigger Point Dry-Needling  Treatment instructions: Expect mild to moderate muscle soreness. S/S of pneumothorax if dry needled over a lung field, and to seek immediate medical attention should they occur. Patient verbalized understanding of these instructions and education.  Patient Consent Given: Yes Education handout provided: Yes Muscles treated: left gluteus medius, left gluteus maximus, left piriformis Electrical stimulation performed: No Parameters: N/A Treatment response/outcome: elongation of muscle and trigger point response Exercises: Stretches/mobility: Piriformis stretch holding 30 sec  bil. Supine Single knee to chest holding 30 sec bil.  Strengthening: Supine hip abduction with red band 20 x  Mini bridge 10 x  Supine alternate hip flexion with red band around knees and core engaged 20 x Quadruped lift lower extremity with core engaged 10 x each way with slight coccyx pain Standing with hands on wall and lift opposite arm and leg 10 x each                                                                                                                                PATIENT EDUCATION: 01/17/24 Education details: Access Code: [facility], educated patient on how to massage her hip adductors and levator ani in sitting, information on dry needling Person educated: Patient Education method: Explanation, Demonstration, Tactile cues, Verbal cues, and Handouts Education comprehension: verbalized understanding, returned demonstration, verbal cues required, tactile cues required, and needs further education   HOME EXERCISE PROGRAM: 01/17/24 Access Code: [facility] URL: [email or web address] Date: 01/17/2024 Prepared by: Eulis Foster  Exercises - Seated Hip Adductor Stretch on Swiss Ball  - 1 x daily - 3 x weekly - 1 sets - 1 reps - 30 sec hold - Seated Piriformis Stretch with Trunk Bend  - 1 x daily - 3 x weekly - 1 sets - 1 reps - 30 sec hold - Supine Diaphragmatic Breathing  - 2 x daily - 7 x weekly - 1 sets - 15 reps - Modified Bird Dog At Wall  - 1 x daily - 3 x weekly - 2 sets - 10 reps - Hooklying Clamshell with Resistance  - 1 x daily - 3 x weekly - 2 sets - 10 reps - Seated Pelvic Floor Contraction  - 3 x daily - 7 x weekly - 1 sets - 5 reps - 20 sec hold - Seated Hip Flexor Stretch  - 1 x daily - 3 x weekly - 1 sets - 2 reps - 30 sec hold - Happy Baby with Pelvic Floor Lengthening  - 1 x daily - 3 x weekly - 1 sets - 1 reps - 30 sec hold - Seated Happy Baby With Trunk Flexion For Pelvic Relaxation  - 1 x daily - 3 x weekly - 1 sets - 1 reps - 30 sec hold -  Standing Hip Abduction  - 1 x daily - 3 x weekly - 1 sets - 10 reps   ASSESSMENT:  CLINICAL IMPRESSION: Patient is a  73 y.o. female who was seen today for physical therapy  treatment for perineal and pelvic pain. Patient reports her pain is 40% better.  She leaked stool 1 day since last treatment. Stool leakage is 80% better and she is on a good regime to manage it. She may leak stool 1 time per week. Rectal strength is 3/5 holding 20 seconds.   Perineal pain is 70% better. Coccyx bone pain is gone. Patient is not having the interstitial pain. Patient is using the wand for the pelvic pain. Patient understands how to manage her pain and decrease her stress.   OBJECTIVE IMPAIRMENTS: decreased coordination, decreased strength, increased fascial restrictions, increased muscle spasms, and pain.   ACTIVITY LIMITATIONS: sitting, continence, toileting, and locomotion level  PARTICIPATION LIMITATIONS: meal prep, driving, shopping, and community activity  PERSONAL FACTORS: Age, Time since onset of injury/illness/exacerbation, and 1-2 comorbidities: Appendectomy; Fibromyalgia; Lyme disease; IBS: TMJ; ICS  are also affecting patient's functional outcome.   REHAB POTENTIAL: Good  CLINICAL DECISION MAKING: Evolving/moderate complexity  EVALUATION COMPLEXITY: Moderate   GOALS: Goals reviewed with patient? Yes  SHORT TERM GOALS: Target date: 12/09/23  Patient understands how to use the pelvic wand in the rectal area to reduce her trigger points.  Baseline: Goal status: Met 11/27/23  2.  Patient understands how to bulge the pelvic floor to lengthen the muscles.  Baseline:  Goal status: Met 12/04/23   LONG TERM GOALS: Target date: 03/04/24  Patient independent with advanced HEP for core and pelvic floor strength.  Baseline:  Goal status: Met 01/17/24  2.  Patient understands ways to manage her pain using her wand, stretches, diaphragmatic breathing and modalities.  Baseline:  Goal status: Met  01/17/24  3.  Patient reports she has not had stool leakage for 2 weeks due to reduction of pain and improved strength.  Baseline: stool leakage is 80% better and 1 time per week Goal status: partially met 01/04/24  4.  Pelvic floor strength >/= 3/5 holding 15-20 seconds to reduce her continence.  Baseline:  Goal status: Met  01/17/24  5.  Her pelvic and perineal pain decreased >/= 50% due to reduction of trigger points.  Baseline: Perineal pain is 70% better.  Goal status: Met 01/17/24   PLAN:Discharge to HEP this visit.     Eulis Foster, PT 01/17/24 11:44 AM  PHYSICAL THERAPY DISCHARGE SUMMARY  Visits from Start of Care: 6  Current functional level related to goals / functional outcomes: See above.    Remaining deficits: See above   Education / Equipment: HEP   Patient agrees to discharge. Patient goals were partially met. Patient is being discharged due to being pleased with the current functional level.Thank you for the referral.   Eulis Foster, PT 01/17/24 12:04 PM

## 2024-01-22 ENCOUNTER — Encounter: Payer: PPO | Admitting: Physical Therapy

## 2024-01-26 DIAGNOSIS — N840 Polyp of corpus uteri: Secondary | ICD-10-CM | POA: Diagnosis not present

## 2024-01-26 DIAGNOSIS — N83292 Other ovarian cyst, left side: Secondary | ICD-10-CM | POA: Diagnosis not present

## 2024-01-29 ENCOUNTER — Encounter: Payer: PPO | Admitting: Physical Therapy

## 2024-02-05 ENCOUNTER — Encounter: Payer: PPO | Admitting: Physical Therapy

## 2024-02-14 DIAGNOSIS — E782 Mixed hyperlipidemia: Secondary | ICD-10-CM | POA: Diagnosis not present

## 2024-02-15 ENCOUNTER — Encounter (HOSPITAL_BASED_OUTPATIENT_CLINIC_OR_DEPARTMENT_OTHER): Payer: Self-pay

## 2024-02-15 LAB — LIPID PANEL
Chol/HDL Ratio: 3.7 {ratio} (ref 0.0–4.4)
Cholesterol, Total: 241 mg/dL — ABNORMAL HIGH (ref 100–199)
HDL: 66 mg/dL (ref >39)
LDL Chol Calc (NIH): 156 mg/dL — ABNORMAL HIGH (ref 0–99)
Triglycerides: 109 mg/dL (ref 0–149)
VLDL Cholesterol Cal: 19 mg/dL (ref 5–40)

## 2024-02-20 ENCOUNTER — Ambulatory Visit (INDEPENDENT_AMBULATORY_CARE_PROVIDER_SITE_OTHER): Payer: No Typology Code available for payment source | Admitting: Internal Medicine

## 2024-02-20 VITALS — BP 152/82 | HR 74 | Ht 61.5 in | Wt 168.0 lb

## 2024-02-20 DIAGNOSIS — T466X5D Adverse effect of antihyperlipidemic and antiarteriosclerotic drugs, subsequent encounter: Secondary | ICD-10-CM | POA: Diagnosis not present

## 2024-02-20 DIAGNOSIS — E782 Mixed hyperlipidemia: Secondary | ICD-10-CM

## 2024-02-20 DIAGNOSIS — E785 Hyperlipidemia, unspecified: Secondary | ICD-10-CM | POA: Diagnosis not present

## 2024-02-20 DIAGNOSIS — M791 Myalgia, unspecified site: Secondary | ICD-10-CM | POA: Diagnosis not present

## 2024-02-20 DIAGNOSIS — E7841 Elevated Lipoprotein(a): Secondary | ICD-10-CM

## 2024-02-20 NOTE — Patient Instructions (Signed)
 Medication Instructions:  NO CHANGES *If you need a refill on your cardiac medications before your next appointment, please call your pharmacy*  Lab Work: FASTING lipid panel in 4 months -- before next appointment  If you have labs (blood work) drawn today and your tests are completely normal, you will receive your results only by: MyChart Message (if you have MyChart) OR A paper copy in the mail If you have any lab test that is abnormal or we need to change your treatment, we will call you to review the results.  Follow-Up: At [facility], you and your health needs are our priority.  As part of our continuing mission to provide you with exceptional heart care, we have created designated Provider Care Teams.  These Care Teams include your primary Cardiologist (physician) and Advanced Practice Providers (APPs -  Physician Assistants and Nurse Practitioners) who all work together to provide you with the care you need, when you need it.  We recommend signing up for the patient portal called "MyChart".  Sign up information is provided on this After Visit Summary.  MyChart is used to connect with patients for Virtual Visits (Telemedicine).  Patients are able to view lab/test results, encounter notes, upcoming appointments, etc.  Non-urgent messages can be sent to your provider as well.   To learn more about what you can do with MyChart, go to [email or web address].    Your next appointment:   4 months with Eligha Bridegroom NP (lipid clinic)

## 2024-02-20 NOTE — Progress Notes (Signed)
 LIPID CLINIC CONSULT NOTE  Chief Complaint:  Follow-up dyslipidemia, palpitations  Primary Care Physician: Ardith Dark, MD  Primary Cardiologist:  Parke Poisson, MD  HPI:  Natalie Erickson is a 73 y.o. female who is being seen today for the evaluation of dyslipidemia at the request of Ardith Dark, MD. This is a very pleasant 73 year old female kindly referred by her primary care provider for evaluation management of dyslipidemia.  She has been previously seen in our office by Dr. Alveta Heimlich as well as more recently by Blima Rich, PA-C.  She has a history of statin intolerance and was referred for evaluation management of her cholesterol.  She has been trying to manage this with diet and activity and more recently has had a reduction in her LDL cholesterol from 1 87 -> 150, total cholesterol 233, triglycerides 106 and HDL 61.  She has had prior studies including CT scan of the abdomen and pelvis which showed aortic atherosclerosis.  There is some heart disease remotely in her family.  She is a retired Engineer, civil (consulting) and has done reiki therapy and other things.  She has a concern about statins as far as potential side effects.  She did seem amenable to trying red yeast rice today.  She also wanted to look further in her lipid profile with an NMR which I am certainly happy to obtain however her insurance is not likely to pay for that unless we wait several months.  Since she is interested in trying over-the-counter therapy, would make sense to try to get this information in a few months on treatment.  She reports her diet is fairly low in saturated fats in fact because of issues with IBS and fibromyalgia, she has severely limited her diet.  09/09/2022  Natalie Erickson returns today for follow-up.  She underwent a lipid NMR which shows elevated cholesterol.  Her LDL particle number is 1776, LDL-C of 154, HDL-C64 and triglycerides 75.  Her LP(a) also was significantly elevated at 281.4 nmol/L.  She reports  that her sister also has a very high LP(a) and is on Repatha.  She could also not tolerate statins.  Natalie Erickson has not been able to tolerate statins and recently tried red yeast rice which also caused her significant myalgias.  As noted above she has ASCVD with imaging evidence of aortic atherosclerosis.  01/11/2023  Natalie Erickson is seen today in follow-up. She reports that after taking Repatha for 8 weeks, she developed worsening IBS symptoms  and knee pain.  She reports stopping the Repatha but has not had total improvement in the symptoms however they are better.  She is not interested in the medication anymore.  She did have an improvement in her LP(a) with a decrease from 281 to 183 nmol/L.  Minimal improvement in her LDL particle numbers from 17 76-15 72 with an LDL of 140 down from 154.  Options are quite limited.  We discussed the possibility of Leqvio but because of the long half-life of the medication she is not interested due to side effect concerns.  She did say that she was interested in a statin.  She wanted to try the lowest dose of pravastatin to try to work up as it was tolerated.  I explained to her that statin side effects are dose-dependent and typically we like to choose the dose of the statin that would be most effective for lipid lowering.  I advised that we wanted to try to a low-dose statin we  could consider low-dose rosuvastatin which is associated with about a 35% cholesterol reduction compared to about 10 to 15% with 10 mg of pravastatin.  05/11/2023  Natalie Erickson returns for follow-up. Unfortunately, she was unable to tolerate 5 mg of rosuvastatin.  She did have repeat lipids which show persistently elevated LDL cholesterol at 160 with particle #[id number].  We discussed the very few options she may have for additional therapy but she again mentioned that she wanted to consider trying 10 mg of pravastatin, and a low-dose of medication.  He did report that recently she had had some palpitations  although was diagnosed with sleep apnea and is on CPAP with complete resolution of them.  09/29/2023  Natalie Erickson returns today for follow-up.  Unfortunately she could not tolerate pravastatin.  She is also been intolerant of atorvastatin, rosuvastatin and simvastatin in the past.  She brought in a list of additional statin she has not tried today.  She could not take Repatha.  We discussed bempedoic acid and ezetimibe which have also not been trialed however she does have some other medical conditions that may preclude this.  She is hesitant to try them for that reason.  She would prefer to try another statin.  That being said, with diet and exercise she has managed to lower her cholesterol further.  Total now 207 (down from 247), triglycerides 107, HDL 59 and LDL 129.  02/20/2024  Natalie Erickson is seen today in follow-up.  Unfortunately she could not tolerate Livalo.  She is now intolerant to 4 different statins and Repatha.  She is trying to take ezetimibe but only once or twice a week.  She is also had difficulty exercising because of piriformis syndrome.  She has started to increase her exercise.  We discussed the only other option for her which may be Leqvio.  She had some hesitation about that given the concern about long duration side effects.  I reassured her that that is less likely however she may still have side effects with it.  She would like to continue increasing the ezetimibe and have repeat labs in several months.  PMHx:  Past Medical History:  Diagnosis Date   AC (acromioclavicular) joint bone spurs    c 5 and c6 and thoracic   Allergy    food allergies   Anxiety 04/06/2017   Benign thyroid cyst sept 2020 dx   Cataract    bilateral   Cystitis    Depression    Dry eyes 04/06/2017   Elevated BP without diagnosis of hypertension 04/06/2017   lost weight >> BP improved   Fibromyalgia    Floaters    both eyes   Heart murmur    hx of 2 Echocardiograms in [place] (pt was told they  were normal)   Hiatal hernia    Hip pain    and groin pain for a while   History of colon polyps    History of echocardiogram    Echo 9/19: mild LVH, EF 60-65, no RWMA, Gr 1 DD, trivial MR/TR, PASP 16   History of kidney stones    History of Lyme disease    Hypercalcemia    causes kidney stones, none recent   IBS (irritable bowel syndrome)    Mixed hyperlipidemia 04/06/2017   intol to statins due to myalgias; never tried Zetia   Occipital neuralgia    Sleep apnea    mild to moderate , uses cpap   TMJ (dislocation of temporomandibular joint)  UTI (urinary tract infection) finishes antibiotic 03-27-2020   Vasomotor rhinitis     Past Surgical History:  Procedure Laterality Date   APPENDECTOMY  1959   COLONOSCOPY  2016   [employer]   COLONOSCOPY  2011   in [place]    EVALUATION UNDER ANESTHESIA WITH HEMORRHOIDECTOMY N/A 04/02/2020   Procedure: ANORECTAL EXAM UNDER ANESTHESIA WITH HEMORRHOIDECTOMY, HEMORRHOIDAL LIGATION;  Surgeon: Karie Soda, MD;  Location: [facility];  Service: General;  Laterality: N/A;  GENERAL AND LOCAL   left ovarin cyst removed  age 57's   grapefruit sized, exp laparotomy   POLYPECTOMY     UPPER GASTROINTESTINAL ENDOSCOPY     UPPER GI ENDOSCOPY      FAMHx:  Family History  Problem Relation Age of Onset   Healthy Mother    Hypertension Mother    Healthy Father    Lung cancer Father    Breast cancer Sister    Diabetes Paternal Grandmother    Colon cancer Neg Hx    Stomach cancer Neg Hx    Heart attack Neg Hx    Heart failure Neg Hx    Pancreatic cancer Neg Hx    Colon polyps Neg Hx    Esophageal cancer Neg Hx    Rectal cancer Neg Hx     SOCHx:   reports that she has never smoked. She has never used smokeless tobacco. She reports that she does not drink alcohol and does not use drugs.  ALLERGIES:  Allergies  Allergen Reactions   Diltiazem Shortness Of Breath   Metoprolol Shortness Of Breath   Statins Tinitus     Achy joints, muscle aches, nerve pain   Chocolate Other (See Comments)    migraine   Cocoa Other (See Comments)    migraine migraine   Codeine Nausea And Vomiting   Cranberry Other (See Comments)    Cystitis   Latex Rash   Monosodium Glutamate Other (See Comments)    Migraine   Penicillins Rash    Has patient had a PCN reaction causing immediate rash, facial/tongue/throat swelling, SOB or lightheadedness with hypotension: Yes Has patient had a PCN reaction causing severe rash involving mucus membranes or skin necrosis: No Has patient had a PCN reaction that required hospitalization No Has patient had a PCN reaction occurring within the last 10 years: No If all of the above answers are "NO", then may proceed with Cephalosporin use.    Antihistamines, Loratadine-Type     Throat swelling   Carvedilol Other (See Comments)    N&N, SOB , Head ache and joint pain.    Ginger    Gluten Meal    Lexapro [Escitalopram]     GI    Montelukast Other (See Comments)    Throat swelling   Other Swelling    Throat swelling   Prednisone Swelling    Throat swelling (no difficulty breathing)   Repatha [Evolocumab] Other (See Comments)    Worsening IBS, knee pain   Sulfites Other (See Comments)    Nausea and headaches   Vanilla    Whey    Zofran [Ondansetron Hcl] Other (See Comments)    Muscle cramps, leg tingling   Azithromycin Other (See Comments) and Diarrhea   Egg-Derived Products Other (See Comments)    States exacerbates her IBS symptoms    ROS: Pertinent items noted in HPI and remainder of comprehensive ROS otherwise negative.  HOME MEDS: Current Outpatient Medications on File Prior to Visit  Medication Sig Dispense Refill  acetaminophen (TYLENOL) 500 MG tablet Take 1,000 mg by mouth every 6 (six) hours as needed for headache (pain).     AMBULATORY NON FORMULARY MEDICATION Take 1 tablet by mouth as needed. Medication Name: Currie [place]     baclofen 5 MG TABS Take 1 tablet (5 mg  total) by mouth 3 (three) times daily.     Cholecalciferol (VITAMIN D) 2000 units tablet Take 2,000 Units by mouth daily.      co-enzyme Q-10 30 MG capsule Take 100 mg by mouth daily.     ezetimibe (ZETIA) 10 MG tablet Take 1 tablet (10 mg total) by mouth daily. 90 tablet 3   famotidine (PEPCID) 40 MG tablet Take 1 tablet (40 mg total) by mouth daily. 30 tablet 5   gabapentin (NEURONTIN) 100 MG capsule Take by mouth.     Magnesium Glycinate POWD 120 mg by Does not apply route as needed.      meloxicam (MOBIC) 15 MG tablet Take 1 tablet (15 mg total) by mouth daily. 30 tablet 0   Multiple Vitamins-Minerals (PRESERVISION AREDS PO) Take 1 tablet by mouth daily.     omega-3 acid ethyl esters (LOVAZA) 1 g capsule Take 1,000 mg by mouth 2 (two) times daily.     Polyethyl Glycol-Propyl Glycol (SYSTANE ULTRA OP) Apply to eye at bedtime.      polyethylene glycol powder (GLYCOLAX/MIRALAX) 17 GM/SCOOP powder Take 17-34 g by mouth as needed.     Pitavastatin Magnesium (ZYPITAMAG) 1 MG TABS Take 1 mg by mouth daily. (Patient not taking: Reported on 02/20/2024) 30 tablet 11   No current facility-administered medications on file prior to visit.    LABS/IMAGING: No results found for this or any previous visit (from the past 48 hours). No results found.  LIPID PANEL:    Component Value Date/Time   CHOL 241 (H) 02/14/2024 0808   TRIG 109 02/14/2024 0808   HDL 66 02/14/2024 0808   CHOLHDL 3.7 02/14/2024 0808   CHOLHDL 3.2 02/13/2023 0839   VLDL 21.2 05/06/2022 0834   LDLCALC 156 (H) 02/14/2024 0808   LDLCALC 107 (H) 02/13/2023 0839    WEIGHTS: Wt Readings from Last 3 Encounters:  02/20/24 168 lb (76.2 kg)  01/03/24 166 lb 8 oz (75.5 kg)  11/03/23 158 lb 3.2 oz (71.8 kg)    VITALS: BP (!) 152/82 (BP Location: Left Arm, Patient Position: Sitting, Cuff Size: Normal)   Pulse 74   Ht 5' 1.5" (1.562 m)   Wt 168 lb (76.2 kg)   SpO2 97%   BMI 31.23 kg/m    EXAM: Deferred  EKG: Deferred  ASSESSMENT: Mixed dyslipidemia ASCVD with aortic atherosclerosis Statin intolerance-myalgias Repatha intolerance Fibromyalgia IBS-C Palpitations OSA on CPAP Elevated LP(a) at 281.4 nmol/L  PLAN: 1.   Ms. Brassell has had an increase in her cholesterol with an LDL now up to 156 from 129.  She could not tolerate Livalo.  Options are limited but she is trying to take the Zetia more regularly.  I will plan follow-up with repeat lipids in about 4 months that she can see our lipid APP.  If she is interested would consider referral for Leqvio.  Chrystie Nose, MD, [facility], FACP  [place]  [facility]  Medical Director of the [facility] &  Card[facility]ovascular [facility] Reduction Clinic Diplomate of the American Board of Clinical Lipidology Attending Cardiologist  Direct Dial: [phone]  Fax: [phone]  Website:  [email or web address] 02/20/2024, 4:28 PM

## 2024-02-28 DIAGNOSIS — N83209 Unspecified ovarian cyst, unspecified side: Secondary | ICD-10-CM | POA: Diagnosis not present

## 2024-02-28 DIAGNOSIS — R9389 Abnormal findings on diagnostic imaging of other specified body structures: Secondary | ICD-10-CM | POA: Diagnosis not present

## 2024-03-14 DIAGNOSIS — M9902 Segmental and somatic dysfunction of thoracic region: Secondary | ICD-10-CM | POA: Diagnosis not present

## 2024-03-14 DIAGNOSIS — M9905 Segmental and somatic dysfunction of pelvic region: Secondary | ICD-10-CM | POA: Diagnosis not present

## 2024-03-14 DIAGNOSIS — M5442 Lumbago with sciatica, left side: Secondary | ICD-10-CM | POA: Diagnosis not present

## 2024-03-14 DIAGNOSIS — M9903 Segmental and somatic dysfunction of lumbar region: Secondary | ICD-10-CM | POA: Diagnosis not present

## 2024-03-27 ENCOUNTER — Ambulatory Visit (INDEPENDENT_AMBULATORY_CARE_PROVIDER_SITE_OTHER): Admitting: Podiatry

## 2024-03-27 DIAGNOSIS — M19072 Primary osteoarthritis, left ankle and foot: Secondary | ICD-10-CM

## 2024-03-27 DIAGNOSIS — B351 Tinea unguium: Secondary | ICD-10-CM

## 2024-03-27 MED ORDER — CICLOPIROX 8 % EX SOLN: Freq: Every day | CUTANEOUS | 0 refills | Status: AC

## 2024-03-27 NOTE — Progress Notes (Signed)
 Subjective:  Patient ID: Natalie Erickson, female    DOB: 1951/04/05,  MRN: [id number]  Chief Complaint  Patient presents with   Nail Problem    Right hallux nail fungus     73 y.o. female presents with the above complaint.  Patient presents with left dorsal midfoot pain has progressive gotten worse worse with ambulation worse with pressure she would like to discuss treatment options for this.  She denies seeing anyone else prior to seeing me.  Hurts with ambulation.  She also has secondary complaint of right hallux nail fungus.  She does not able to tolerate oral medication she would like to discuss topical options for nail fungus.  She has tried some over-the-counter remedies none of which has helped   Review of Systems: Negative except as noted in the HPI. Denies N/V/F/Ch.  Past Medical History:  Diagnosis Date   AC (acromioclavicular) joint bone spurs    c 5 and c6 and thoracic   Allergy    food allergies   Anxiety 04/06/2017   Benign thyroid cyst sept 2020 dx   Cataract    bilateral   Cystitis    Depression    Dry eyes 04/06/2017   Elevated BP without diagnosis of hypertension 04/06/2017   lost weight >> BP improved   Fibromyalgia    Floaters    both eyes   Heart murmur    hx of 2 Echocardiograms in [place] (pt was told they were normal)   Hiatal hernia    Hip pain    and groin pain for a while   History of colon polyps    History of echocardiogram    Echo 9/19: mild LVH, EF 60-65, no RWMA, Gr 1 DD, trivial MR/TR, PASP 16   History of kidney stones    History of Lyme disease    Hypercalcemia    causes kidney stones, none recent   IBS (irritable bowel syndrome)    Mixed hyperlipidemia 04/06/2017   intol to statins due to myalgias; never tried Zetia   Occipital neuralgia    Sleep apnea    mild to moderate , uses cpap   TMJ (dislocation of temporomandibular joint)    UTI (urinary tract infection) finishes antibiotic 03-27-2020   Vasomotor rhinitis     Current  Outpatient Medications:    ciclopirox (PENLAC) 8 % solution, Apply topically at bedtime. Apply over nail and surrounding skin. Apply daily over previous coat. After seven (7) days, may remove with alcohol and continue cycle., Disp: 6.6 mL, Rfl: 0   acetaminophen (TYLENOL) 500 MG tablet, Take 1,000 mg by mouth every 6 (six) hours as needed for headache (pain)., Disp: , Rfl:    AMBULATORY NON FORMULARY MEDICATION, Take 1 tablet by mouth as needed. Medication Name: [facility], Disp: , Rfl:    baclofen 5 MG TABS, Take 1 tablet (5 mg total) by mouth 3 (three) times daily., Disp: , Rfl:    Cholecalciferol (VITAMIN D) 2000 units tablet, Take 2,000 Units by mouth daily. , Disp: , Rfl:    co-enzyme Q-10 30 MG capsule, Take 100 mg by mouth daily., Disp: , Rfl:    ezetimibe (ZETIA) 10 MG tablet, Take 1 tablet (10 mg total) by mouth daily., Disp: 90 tablet, Rfl: 3   famotidine (PEPCID) 40 MG tablet, Take 1 tablet (40 mg total) by mouth daily., Disp: 30 tablet, Rfl: 5   gabapentin (NEURONTIN) 100 MG capsule, Take by mouth., Disp: , Rfl:    Magnesium Glycinate POWD,  120 mg by Does not apply route as needed. , Disp: , Rfl:    meloxicam (MOBIC) 15 MG tablet, Take 1 tablet (15 mg total) by mouth daily., Disp: 30 tablet, Rfl: 0   Multiple Vitamins-Minerals (PRESERVISION AREDS PO), Take 1 tablet by mouth daily., Disp: , Rfl:    omega-3 acid ethyl esters (LOVAZA) 1 g capsule, Take 1,000 mg by mouth 2 (two) times daily., Disp: , Rfl:    Pitavastatin Magnesium (ZYPITAMAG) 1 MG TABS, Take 1 mg by mouth daily. (Patient not taking: Reported on 02/20/2024), Disp: 30 tablet, Rfl: 11   Polyethyl Glycol-Propyl Glycol (SYSTANE ULTRA OP), Apply to eye at bedtime. , Disp: , Rfl:    polyethylene glycol powder (GLYCOLAX/MIRALAX) 17 GM/SCOOP powder, Take 17-34 g by mouth as needed., Disp: , Rfl:   Social History   Tobacco Use  Smoking Status Never  Smokeless Tobacco Never    Allergies  Allergen Reactions   Diltiazem  Shortness Of Breath   Metoprolol Shortness Of Breath   Statins Tinitus    Achy joints, muscle aches, nerve pain   Chocolate Other (See Comments)    migraine   Cocoa Other (See Comments)    migraine migraine   Codeine Nausea And Vomiting   Cranberry Other (See Comments)    Cystitis   Latex Rash   Monosodium Glutamate Other (See Comments)    Migraine   Penicillins Rash    Has patient had a PCN reaction causing immediate rash, facial/tongue/throat swelling, SOB or lightheadedness with hypotension: Yes Has patient had a PCN reaction causing severe rash involving mucus membranes or skin necrosis: No Has patient had a PCN reaction that required hospitalization No Has patient had a PCN reaction occurring within the last 10 years: No If all of the above answers are "NO", then may proceed with Cephalosporin use.    Antihistamines, Loratadine-Type     Throat swelling   Carvedilol Other (See Comments)    N&N, SOB , Head ache and joint pain.    Ginger    Gluten Meal    Lexapro [Escitalopram]     GI    Montelukast Other (See Comments)    Throat swelling   Other Swelling    Throat swelling   Prednisone Swelling    Throat swelling (no difficulty breathing)   Repatha [Evolocumab] Other (See Comments)    Worsening IBS, knee pain   Sulfites Other (See Comments)    Nausea and headaches   Vanilla    Whey    Zofran [Ondansetron Hcl] Other (See Comments)    Muscle cramps, leg tingling   Azithromycin Other (See Comments) and Diarrhea   Egg-Derived Products Other (See Comments)    States exacerbates her IBS symptoms   Objective:  There were no vitals filed for this visit. There is no height or weight on file to calculate BMI. Constitutional Well developed. Well nourished.  Vascular Dorsalis pedis pulses palpable bilaterally. Posterior tibial pulses palpable bilaterally. Capillary refill normal to all digits.  No cyanosis or clubbing noted. Pedal hair growth normal.  Neurologic  Normal speech. Oriented to person, place, and time. Epicritic sensation to light touch grossly present bilaterally.  Dermatologic Right hallux thickened and onychodystrophy mycotic nail x 1 No open wounds. No skin lesions.  Orthopedic: Pain on palpation left dorsal midfoot clinically able to appreciate the underlying arthritis.  Pain on palpation.  No open wounds or lesion noted   Radiographs: None Assessment:   1. Nail fungus   2. Arthritis of  left midfoot    Plan:  Patient was evaluated and treated and all questions answered.  Left dorsal midfoot arthritis -All questions and concerns were discussed with the patient extensive detail given the amount of pain that she is having she will benefit from steroid injection to help decrease inflammatory component associate with pain.  Patient agrees with plan like to proceed with steroid injection -A steroid injection was performed at left dorsal midfoot using 1% plain Lidocaine and 10 mg of Kenalog. This was well tolerated.  Right hallux nail fungus -Educated the patient on the etiology of onychomycosis and various treatment options associated with improving the fungal load.  I explained to the patient that there is 3 treatment options available to treat the onychomycosis including topical, p.o., laser treatment.  Patient elected to undergo p.o. options with Lamisil/terbinafine therapy.  In order for me to start the medication therapy, I explained to the patient the importance of evaluating the liver and obtaining the liver function test.  Once the liver function test comes back normal I will start him on 21-month course of Lamisil therapy.  Patient understood all risk and would like to proceed with Lamisil therapy.  I have asked the patient to immediately stop the Lamisil therapy if she has any reactions to it and call the office or go to the emergency room right away.  Patient states understanding    No follow-ups on file.

## 2024-04-06 ENCOUNTER — Encounter

## 2024-04-06 ENCOUNTER — Telehealth: Admitting: Family Medicine

## 2024-04-06 DIAGNOSIS — S00469A Insect bite (nonvenomous) of unspecified ear, initial encounter: Secondary | ICD-10-CM | POA: Diagnosis not present

## 2024-04-06 DIAGNOSIS — W57XXXA Bitten or stung by nonvenomous insect and other nonvenomous arthropods, initial encounter: Secondary | ICD-10-CM

## 2024-04-06 DIAGNOSIS — Z8619 Personal history of other infectious and parasitic diseases: Secondary | ICD-10-CM

## 2024-04-06 MED ORDER — DOXYCYCLINE HYCLATE 100 MG PO TABS: 100.0000 mg | ORAL_TABLET | Freq: Two times a day (BID) | ORAL | 0 refills | Status: AC

## 2024-04-06 NOTE — Patient Instructions (Signed)
Tick Bite Information, Adult  Ticks are insects that draw blood for food. They climb onto people and animals that brush against the leaves and grasses that they live in. They then bite and attach to the skin. Most ticks are harmless, but some ticks may carry germs that can cause disease. These germs are spread to a person through a bite. To lower your risk of getting a disease from a tick bite, make sure you: Take steps to prevent tick bites. Check for ticks after being outdoors where ticks live. Watch for symptoms of disease if a tick attached to you or if you think a tick bit you. How can I prevent tick bites? Take these steps to help prevent tick bites when you go outdoors in an area where ticks live: Before you go outdoors: Wear long sleeves and long pants to protect your skin from ticks. Wear light-colored clothing so you can see ticks easier. Tuck your pant legs into your socks. Apply insect repellent that has DEET (20% or higher), picaridin, or IR3535 in it to the following areas: Any bare skin. Avoid areas around the eyes and mouth. Edges of clothing, like the top of your boots, the bottom of your pant legs, and your sleeve cuffs. Consider applying an insect repellant that contains permethrin. Follow the instructions on the label. Do not apply permethrin directly to the skin. Instead, apply to the following areas: Clothing and shoes. Outdoor gear and tents. When you are outdoors: Avoid walking through areas with long grass. If you are walking on a trail, stay in the middle of the trail so your skin, hair, and clothing do not touch the bushes. Check for ticks on your clothing, hair, and skin often while you are outdoors. Check again before you go inside. When you go indoors: Check your clothing for ticks. Tumble dry clothes in a dryer on high heat for at least 10 minutes. If clothes are damp, additional time may be needed. If clothes require washing, use hot water. Check your gear and  pets. Shower soon after being outdoors. Check your body for ticks. Do a full body check using a mirror. Be sure to check your scalp, neck, armpits, waist, groin, and joint areas. These are the spots where ticks attach themselves most often. What is the best way to remove a tick?  Remove the tick as soon as possible. Removing it can prevent germs from passing to your body. Do not remove the tick with your bare fingers. Do not try to remove a tick with heat, alcohol, petroleum jelly, or fingernail polish. These things can cause the tick to salivate and regurgitate into your bloodstream, increasing your risk of getting a disease. To remove a tick that is crawling on your skin: Go outside and brush the tick off. Use tape or a lint roller. To remove a tick that is attached to your skin: Wash your hands. If you have gloves, put them on. Use a fine-tipped tweezer, curved forceps, or a tick-removal tool to gently grasp the tick as close to your skin and the tick's head as possible. Gently pull with a steady, upward, and even pressure until the tick lets go. While removing the tick: Take care to keep the tick's head attached to its body. Do not twist or jerk the tick. This can make the tick's head or mouth parts break off and stay in your skin. If this happens, try to remove the mouth parts with tweezers. If you cannot remove them, leave   the area alone and let the skin heal. Do not squeeze or crush the tick's body. This could force disease-carrying fluids from the tick into your body. What should I do after removing a tick? Clean the bite area and your hands with soap and water, rubbing alcohol, or an iodine scrub. If an antiseptic cream or ointment is available, put a small amount on the bite area. Wash and disinfect any tools that you used to remove the tick. How should I dispose of a tick? To dispose of a live tick, use one of these methods: Place it in rubbing alcohol. Place it in a sealed bag  or container, and throw it away. Wrap it tightly in tape, and throw it away. Flush it down the toilet. Where to find more information Centers for Disease Control and Prevention: cdc.gov/ticks [place]. Environmental Protection Agency: epa.gov/insect-repellents Contact a health care provider if: You have symptoms of a disease after a tick bite. Symptoms of a tick-borne disease can occur from moments after the tick bites to 30 days after a tick is removed. Symptoms include: Fever or chills. A red rash that makes a circle (bull's-eye rash) in the bite area. Redness and swelling in the bite area. Headache or stiff neck. Muscle, joint, or bone pain. Abnormal tiredness. Numbness in your legs or trouble walking or moving your legs. Tender or swollen lymph glands. Abdominal pain, vomiting, diarrhea, or weight loss. Get help right away if: You are not able to remove a tick. You have muscle weakness or paralysis. Your symptoms get worse or you experience new symptoms. You find an engorged tick on your skin and you are in an area where there is a higher risk of disease from ticks. Summary Ticks may carry germs that can spread to a person through a bite. These germs can cause disease. Wear protective clothing and use insect repellent to prevent tick bites. Follow the instructions on the label. If you find a tick on your body, remove it as soon as possible. If the tick is attached, do not try to remove it with heat, alcohol, petroleum jelly, or fingernail polish. If you have symptoms of a disease after being bitten by a tick, contact a health care provider. This information is not intended to replace advice given to you by your health care provider. Make sure you discuss any questions you have with your health care provider. Document Revised: 03/14/2022 Document Reviewed: 03/14/2022 Elsevier Patient Education  2024 [employer]  

## 2024-04-06 NOTE — Progress Notes (Signed)
 Virtual Visit Consent   Natalie Erickson, you are scheduled for a virtual visit with a [facility] provider today. Just as with appointments in the office, your consent must be obtained to participate. Your consent will be active for this visit and any virtual visit you may have with one of our providers in the next 365 days. If you have a MyChart account, a copy of this consent can be sent to you electronically.  As this is a virtual visit, video technology does not allow for your provider to perform a traditional examination. This may limit your provider's ability to fully assess your condition. If your provider identifies any concerns that need to be evaluated in person or the need to arrange testing (such as labs, EKG, etc.), we will make arrangements to do so. Although advances in technology are sophisticated, we cannot ensure that it will always work on either your end or our end. If the connection with a video visit is poor, the visit may have to be switched to a telephone visit. With either a video or telephone visit, we are not always able to ensure that we have a secure connection.  By engaging in this virtual visit, you consent to the provision of healthcare and authorize for your insurance to be billed (if applicable) for the services provided during this visit. Depending on your insurance coverage, you may receive a charge related to this service.  I need to obtain your verbal consent now. Are you willing to proceed with your visit today? Natalie Erickson has provided verbal consent on 04/06/2024 for a virtual visit (video or telephone). [name], FNP  Date: 04/06/2024 10:36 AM   Virtual Visit via Video Note   I, [name], connected with  Natalie Erickson  ([id number], 73/02/1951) on 04/06/24 at 10:30 AM EDT by a video-enabled telemedicine application and verified that I am speaking with the correct person using two identifiers.  Location: Patient: Virtual Visit Location Patient:  Home Provider: Virtual Visit Location Provider: Home Office   I discussed the limitations of evaluation and management by telemedicine and the availability of in person appointments. The patient expressed understanding and agreed to proceed.    History of Present Illness: Natalie Erickson is a 73 y.o. who identifies as a female who was assigned female at birth, and is being seen today for tick bite rt ear removed yesterday. History of LYME 20 yrs ago. Unknown length of time tick on her. No fever, chills, joint pain or headache. 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   Spondylosis 03/07/2023   Depression, recurrent (HCC) 10/28/2020   Aortic atherosclerosis (HCC) 10/28/2020   Statin intolerance 10/28/2020   OSA on CPAP 10/28/2020   Multiple thyroid nodules 10/28/2020   Vitamin D deficiency 10/28/2020   B12 deficiency 10/28/2020   DDD (degenerative disc disease), cervical 01/08/2020   Bilateral occipital neuralgia 01/08/2020   Somatization disorder 11/14/2018   Posterior vitreous detachment of right eye 09/10/2018   IBS (irritable bowel syndrome) 01/30/2018   Fibromyalgia 06/04/2017   Chronic fatigue 06/04/2017   ANA positive 05/20/2017   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   Interstitial cystitis 05/20/2017   Dyspepsia 05/20/2017   Mixed hyperlipidemia 04/06/2017   Anxiety 04/06/2017    Allergies:  Allergies  Allergen Reactions   Diltiazem Shortness Of Breath   Metoprolol Shortness Of Breath   Statins Tinitus    Achy  joints, muscle aches, nerve pain   Chocolate Other (See Comments)    migraine   Cocoa Other (See Comments)    migraine migraine   Codeine Nausea And Vomiting   Cranberry Other (See Comments)    Cystitis   Latex Rash   Monosodium Glutamate Other (See Comments)    Migraine   Penicillins Rash    Has patient had a PCN reaction causing immediate rash, facial/tongue/throat swelling, SOB or  lightheadedness with hypotension: Yes Has patient had a PCN reaction causing severe rash involving mucus membranes or skin necrosis: No Has patient had a PCN reaction that required hospitalization No Has patient had a PCN reaction occurring within the last 10 years: No If all of the above answers are "NO", then may proceed with Cephalosporin use.    Antihistamines, Loratadine-Type     Throat swelling   Carvedilol Other (See Comments)    N&N, SOB , Head ache and joint pain.    Ginger    Gluten Meal    Lexapro [Escitalopram]     GI    Montelukast Other (See Comments)    Throat swelling   Other Swelling    Throat swelling   Prednisone Swelling    Throat swelling (no difficulty breathing)   Repatha [Evolocumab] Other (See Comments)    Worsening IBS, knee pain   Sulfites Other (See Comments)    Nausea and headaches   Vanilla    Whey    Zofran [Ondansetron Hcl] Other (See Comments)    Muscle cramps, leg tingling   Azithromycin Other (See Comments) and Diarrhea   Egg-Derived Products Other (See Comments)    States exacerbates her IBS symptoms   Medications:  Current Outpatient Medications:    acetaminophen (TYLENOL) 500 MG tablet, Take 1,000 mg by mouth every 6 (six) hours as needed for headache (pain)., Disp: , Rfl:    AMBULATORY NON FORMULARY MEDICATION, Take 1 tablet by mouth as needed. Medication Name: [facility], Disp: , Rfl:    baclofen 5 MG TABS, Take 1 tablet (5 mg total) by mouth 3 (three) times daily., Disp: , Rfl:    Cholecalciferol (VITAMIN D) 2000 units tablet, Take 2,000 Units by mouth daily. , Disp: , Rfl:    ciclopirox (PENLAC) 8 % solution, Apply topically at bedtime. Apply over nail and surrounding skin. Apply daily over previous coat. After seven (7) days, may remove with alcohol and continue cycle., Disp: 6.6 mL, Rfl: 0   co-enzyme Q-10 30 MG capsule, Take 100 mg by mouth daily., Disp: , Rfl:    ezetimibe (ZETIA) 10 MG tablet, Take 1 tablet (10 mg total) by  mouth daily., Disp: 90 tablet, Rfl: 3   famotidine (PEPCID) 40 MG tablet, Take 1 tablet (40 mg total) by mouth daily., Disp: 30 tablet, Rfl: 5   gabapentin (NEURONTIN) 100 MG capsule, Take by mouth., Disp: , Rfl:    Magnesium Glycinate POWD, 120 mg by Does not apply route as needed. , Disp: , Rfl:    meloxicam (MOBIC) 15 MG tablet, Take 1 tablet (15 mg total) by mouth daily., Disp: 30 tablet, Rfl: 0   Multiple Vitamins-Minerals (PRESERVISION AREDS PO), Take 1 tablet by mouth daily., Disp: , Rfl:    omega-3 acid ethyl esters (LOVAZA) 1 g capsule, Take 1,000 mg by mouth 2 (two) times daily., Disp: , Rfl:    Pitavastatin Magnesium (ZYPITAMAG) 1 MG TABS, Take 1 mg by mouth daily. (Patient not taking: Reported on 02/20/2024), Disp: 30 tablet, Rfl: 11  Polyethyl Glycol-Propyl Glycol (SYSTANE ULTRA OP), Apply to eye at bedtime. , Disp: , Rfl:    polyethylene glycol powder (GLYCOLAX/MIRALAX) 17 GM/SCOOP powder, Take 17-34 g by mouth as needed., Disp: , Rfl:   Observations/Objective: Patient is well-developed, well-nourished in no acute distress.  Resting comfortably  at home.  Head is normocephalic, atraumatic.  No labored breathing.  Speech is clear and coherent with logical content.  Patient is alert and oriented at baseline.    Assessment and Plan: 1. Tick bite of ear, unspecified laterality, initial encounter (Primary)  2. History of Lyme disease  Follow up with pcp for lab testing if sx persist or worsen. Keep site clean and dry. Med use and side effects discussed.   Follow Up Instructions: I discussed the assessment and treatment plan with the patient. The patient was provided an opportunity to ask questions and all were answered. The patient agreed with the plan and demonstrated an understanding of the instructions.  A copy of instructions were sent to the patient via MyChart unless otherwise noted below.     The patient was advised to call back or seek an in-person evaluation if the  symptoms worsen or if the condition fails to improve as anticipated.    Jibri Schriefer, FNP

## 2024-04-29 ENCOUNTER — Telehealth: Payer: Self-pay | Admitting: *Deleted

## 2024-04-29 NOTE — Telephone Encounter (Signed)
 Natalie Erickson

## 2024-05-01 DIAGNOSIS — G4733 Obstructive sleep apnea (adult) (pediatric): Secondary | ICD-10-CM | POA: Diagnosis not present

## 2024-05-13 ENCOUNTER — Other Ambulatory Visit: Payer: Self-pay | Admitting: Family Medicine

## 2024-05-13 DIAGNOSIS — Z1231 Encounter for screening mammogram for malignant neoplasm of breast: Secondary | ICD-10-CM

## 2024-05-24 ENCOUNTER — Ambulatory Visit
Admission: RE | Admit: 2024-05-24 | Discharge: 2024-05-24 | Disposition: A | Discharge status: Home / Self Care | Source: Ambulatory Visit | Attending: Family Medicine | Admitting: Family Medicine

## 2024-05-24 DIAGNOSIS — Z1231 Encounter for screening mammogram for malignant neoplasm of breast: Secondary | ICD-10-CM | POA: Diagnosis not present

## 2024-06-14 DIAGNOSIS — E782 Mixed hyperlipidemia: Secondary | ICD-10-CM | POA: Diagnosis not present

## 2024-06-15 LAB — LIPID PANEL
Chol/HDL Ratio: 3.8 ratio (ref 0.0–4.4)
Cholesterol, Total: 203 mg/dL — ABNORMAL HIGH (ref 100–199)
HDL: 53 mg/dL (ref >39)
LDL Chol Calc (NIH): 130 mg/dL — ABNORMAL HIGH (ref 0–99)
Triglycerides: 114 mg/dL (ref 0–149)
VLDL Cholesterol Cal: 20 mg/dL (ref 5–40)

## 2024-06-16 ENCOUNTER — Ambulatory Visit (HOSPITAL_BASED_OUTPATIENT_CLINIC_OR_DEPARTMENT_OTHER): Payer: Self-pay | Admitting: Internal Medicine

## 2024-06-18 ENCOUNTER — Ambulatory Visit (INDEPENDENT_AMBULATORY_CARE_PROVIDER_SITE_OTHER): Admitting: Family Medicine

## 2024-06-18 ENCOUNTER — Encounter: Payer: Self-pay | Admitting: Family Medicine

## 2024-06-18 VITALS — BP 118/68 | HR 69 | Temp 97.3°F | Ht 61.5 in | Wt 152.8 lb

## 2024-06-18 DIAGNOSIS — M1990 Unspecified osteoarthritis, unspecified site: Secondary | ICD-10-CM | POA: Insufficient documentation

## 2024-06-18 DIAGNOSIS — K589 Irritable bowel syndrome without diarrhea: Secondary | ICD-10-CM | POA: Diagnosis not present

## 2024-06-18 DIAGNOSIS — E782 Mixed hyperlipidemia: Secondary | ICD-10-CM | POA: Diagnosis not present

## 2024-06-18 DIAGNOSIS — R197 Diarrhea, unspecified: Secondary | ICD-10-CM | POA: Diagnosis not present

## 2024-06-18 NOTE — Assessment & Plan Note (Signed)
 Improving with tai chi.

## 2024-06-18 NOTE — Assessment & Plan Note (Signed)
 Is having some muscle aches with Zetia .  She is no longer on statin.  She is planning on starting Ashwagonda to see if this will help with muscle aches with the Zetia .

## 2024-06-18 NOTE — Progress Notes (Signed)
   Natalie Erickson is a 73 y.o. female who presents today for an office visit.  Assessment/Plan:  New/Acute Problems: Diarrhea  Symptoms have resolved. Likely had food poisoning.  Reassuring exam today.  She will let us  know if she has any recurrence.  Chronic Problems Addressed Today: Osteoarthritis Improving with tai chi.  IBS (irritable bowel syndrome) She has had mild flareup recently due to above food poisoning but seems to be improving now.  She will follow-up with GI as previously planned.  Mixed hyperlipidemia Is having some muscle aches with Zetia .  She is no longer on statin.  She is planning on starting Ashwagonda to see if this will help with muscle aches with the Zetia .     Subjective:  HPI:  See Assessment / plan for status of chronic conditions.  Patient is here today with abdominal cramping and diarrhea. This persisted for about 3 weeks. She thinks that she had bad salmon because symptoms started a couple of days after eating sushi. She did not have any blood in stool. No fevers or chills. Green tea seems to helped. Symptoms resolved yesterday. She feels better today.   She was recently diagnosed with arthritis.  She has been working on tai chi exercises and this does seem to help significantly with her pain.  She did recently get a cortisone shot in her foot which helped with all of her pain.    She is still having some muscle aches with the Zetia .  She did not tolerate any statins in the past.  She is taking co-Q10.  LDL was improving on most recent set of labs.       Objective:  Physical Exam: BP 118/68   Pulse 69   Temp (!) 97.3 F (36.3 C) (Temporal)   Ht 5' 1.5 (1.562 m)   Wt 152 lb 12.8 oz (69.3 kg)   SpO2 99%   BMI 28.40 kg/m   Wt Readings from Last 3 Encounters:  06/18/24 152 lb 12.8 oz (69.3 kg)  02/20/24 168 lb (76.2 kg)  01/03/24 166 lb 8 oz (75.5 kg)  Gen: No acute distress, resting comfortably CV: Regular rate and rhythm with no murmurs  appreciated Pulm: Normal work of breathing, clear to auscultation bilaterally with no crackles, wheezes, or rhonchi Abdomen: Bowel sounds present, soft, nontender, nondistended. Neuro: Grossly normal, moves all extremities Psych: Normal affect and thought content      Graylyn Bunney M. Kennyth, MD 06/18/2024 9:20 AM

## 2024-06-18 NOTE — Patient Instructions (Signed)
 It was very nice to see you today!  I am glad that you are feeling better!  Please let us  know if you have any recurrence of symptoms.  Return if symptoms worsen or fail to improve.   Take care, Dr Kennyth  PLEASE NOTE:  If you had any lab tests, please let us  know if you have not heard back within a few days. You may see your results on mychart before we have a chance to review them but we will give you a call once they are reviewed by us .   If we ordered any referrals today, please let us  know if you have not heard from their office within the next week.   If you had any urgent prescriptions sent in today, please check with the pharmacy within an hour of our visit to make sure the prescription was transmitted appropriately.   Please try these tips to maintain a healthy lifestyle:  Eat at least 3 REAL meals and 1-2 snacks per day.  Aim for no more than 5 hours between eating.  If you eat breakfast, please do so within one hour of getting up.   Each meal should contain half fruits/vegetables, one quarter protein, and one quarter carbs (no bigger than a computer mouse)  Cut down on sweet beverages. This includes juice, soda, and sweet tea.   Drink at least 1 glass of water with each meal and aim for at least 8 glasses per day  Exercise at least 150 minutes every week.

## 2024-06-18 NOTE — Assessment & Plan Note (Signed)
 She has had mild flareup recently due to above food poisoning but seems to be improving now.  She will follow-up with GI as previously planned.

## 2024-06-19 ENCOUNTER — Encounter (HOSPITAL_BASED_OUTPATIENT_CLINIC_OR_DEPARTMENT_OTHER): Payer: Self-pay | Admitting: Nurse Practitioner

## 2024-06-19 ENCOUNTER — Ambulatory Visit (INDEPENDENT_AMBULATORY_CARE_PROVIDER_SITE_OTHER): Payer: No Typology Code available for payment source | Admitting: Nurse Practitioner

## 2024-06-19 VITALS — BP 134/80 | HR 77 | Ht 62.0 in | Wt 153.0 lb

## 2024-06-19 DIAGNOSIS — I700 Atherosclerosis of aorta: Secondary | ICD-10-CM

## 2024-06-19 DIAGNOSIS — T466X5D Adverse effect of antihyperlipidemic and antiarteriosclerotic drugs, subsequent encounter: Secondary | ICD-10-CM

## 2024-06-19 DIAGNOSIS — R011 Cardiac murmur, unspecified: Secondary | ICD-10-CM | POA: Diagnosis not present

## 2024-06-19 DIAGNOSIS — M797 Fibromyalgia: Secondary | ICD-10-CM | POA: Diagnosis not present

## 2024-06-19 DIAGNOSIS — E7841 Elevated Lipoprotein(a): Secondary | ICD-10-CM | POA: Diagnosis not present

## 2024-06-19 DIAGNOSIS — E782 Mixed hyperlipidemia: Secondary | ICD-10-CM | POA: Diagnosis not present

## 2024-06-19 DIAGNOSIS — M791 Myalgia, unspecified site: Secondary | ICD-10-CM

## 2024-06-19 MED ORDER — EZETIMIBE 10 MG PO TABS: 10.0000 mg | ORAL_TABLET | ORAL | Status: DC

## 2024-06-19 NOTE — Progress Notes (Signed)
 Cardiology Office Note   Date:  06/19/2024  ID:  Natalie Erickson, DOB January 04, 1951, MRN [id number] PCP: Kennyth Worth HERO, MD   HeartCare Providers Cardiologist:  Soyla DELENA Merck, MD     PMH Palpitations Hyperlipidemia Fibromyalgia IBS Depression OSA on CPAP Elevated lipoprotein a Aortic atherosclerosis Statin intolerance Murmur  She established with heart care for evaluation of palpitations and DOE.  Echo 2019 revealed EF 60 to 65%, G1 DD, no significant valvular abnormalities.  48-hour Holter monitor revealed occasional PVCs/PACs which corresponded with symptoms.  She was prescribed propranolol  as needed for symptomatic palpitations.  Referred to advanced lipid disorder clinic and seen by Dr. Mona 05/09/2022.  History of statin intolerance.  She had been trying to manage cholesterol with diet and increased activity and had reduction in LDL cholesterol from 187-150.  Prior CT of abdomen and pelvis showed aortic atherosclerosis.  Some heart disease remotely in her family.  She is a retired Engineer, civil (consulting) and has concerns about statins as far as potential side effects.  Recommendation to undergo CT calcium  scoring for further risk stratification.  She wanted to try red yeast rice for lipid-lowering therapy but it also caused significant myalgias.  NMR 09/02/2022 revealed LDL particle #1776, LDL-C 845, small LDL-P 448. LP(a) significant elevated at 281.4 nmol/L. Her sister also has elevated LP(a) and is on Repatha .  She started Repatha  for 8 weeks but developed worsening IBS symptoms and knee pain.  Last clinic visit was 02/20/2024 with Dr. Mona.  She was unable to tolerate rosuvastatin  5 mg daily and continued to have elevated LDL particle numbers.  Livalo  was trialed and unfortunately she did not tolerate.  She was attempting to take ezetimibe  but only once or twice a week.  She was having difficulty exercising because of piriformis syndrome.  She did not want to try Leqvio at that time.  She  was going to try to increase intake of ezetimibe .  History of Present Illness Discussed the use of AI scribe software for clinical note transcription with the patient, who gave verbal consent to proceed.  History of Present Illness Natalie Erickson is a very pleasant 73 year old female who is here today for follow-up of hypercholesterolemia. She has experienced severe pain with statins including Livalo , rosuvastatin  and simvastatin as well as with Repatha , which caused persistent knee and back pain. She has a history of fibromyalgia and arthritis. Recent left foot pain has limited her ability to walk for exercise, previous  decreasing her ability to walk five miles a day. She continues to take her puppy on shorter walks. She continues to engage in yoga and Tai Chi. she stopped ezetimibe  a few weeks ago due to gastrointestinal illness caused by eating a bad piece of salmon. Ezetimibe  caused pain in her toes and elbows, particularly at night but she felt it was tolerable and is willing to resume it. Family history includes a sister who struggles with cholesterol management and a maternal grandfather who had a heart attack. Her mother lived to 29 years old with no history of heart attack or stroke. She follows a strict diet and has lost 18 pounds, which has helped lower her cholesterol levels, though her LDL remains high. She takes fish oil daily and is exploring natural medicine options, including ashwagandha. She sees a functional medicine provider in [place] where she used to live. History of known heart murmur with echocardiogram in 2019 showing normal heart function and trivial valve disease. She denies chest pain, palpitations, shortness  of breath, orthopnea, PND, edema, presyncope, syncope. lightheadedness, or swelling. Her blood pressure is typically low, and she monitors it regularly at home.   ROS: + Chronic pain  Studies Reviewed EKG Interpretation Date/Time:  Wednesday June 19 2024 10:24:11  EDT Ventricular Rate:  77 PR Interval:  156 QRS Duration:  76 QT Interval:  360 QTC Calculation: 407 R Axis:   65  Text Interpretation: Normal sinus rhythm Normal ECG When compared with ECG of 07-Jun-2017 13:55, PREVIOUS ECG IS PRESENT Confirmed by Percy Browning [phone]) on 06/19/2024 10:53:42 AM     Lipoprotein (a)  Date/Time Value Ref Range Status  02/13/2023 08:39 AM 308 (H) <75 nmol/L Final    Comment:    Verified by repeat analysis.  Risk: Optimal <75 nmol/L; Moderate 75-125 nmol/L; High >125 nmol/L. Cardiovascular event risk category cut points (optimal, moderate, high) are based on Tsimika S. JACC 2017;69:692-711.    01/02/2023 08:22 AM 183.9 (H) <75.0 nmol/L Final    Comment:    **Results verified by repeat testing** Note:  Values greater than or equal to 75.0 nmol/L may        indicate an independent risk factor for CHD,        but must be evaluated with caution when applied        to non-Caucasian populations due to the        influence of genetic factors on Lp(a) across        ethnicities.     Risk Assessment/Calculations           Physical Exam VS:  BP 134/80   Pulse 77   Ht 5' 2 (1.575 m)   Wt 153 lb (69.4 kg)   SpO2 97%   BMI 27.98 kg/m    Wt Readings from Last 3 Encounters:  06/19/24 153 lb (69.4 kg)  06/18/24 152 lb 12.8 oz (69.3 kg)  02/20/24 168 lb (76.2 kg)    GEN: Well nourished, well developed in no acute distress NECK: No JVD; No carotid bruits CARDIAC: RRR, soft systolic murmur. No rubs, gallops RESPIRATORY:  Clear to auscultation without rales, wheezing or rhonchi  ABDOMEN: Soft, non-tender, non-distended EXTREMITIES:  No edema; No deformity   Assessment & Plan Hyperlipidemia LDL goal  Lipid panel completed 06/14/2024 with total cholesterol 203, triglycerides 114, HDL 53, and LDL-C 130.  Slight improvement in LDL from 156 on 02/14/2024.  She attributes this improvement to weight loss and dietary improvements.  Exercise has been read  due to muscle and joint problems, but she is consistently doing Tai Chi, light yoga and walking her dog. She has been intolerant to multiple statins and PCSK9i. LDL remains elevated despite lifestyle changes. She does not want to try Nexletol due to concerns about tendon injury. She is concerned about side effects of Leqvio given that it is effective for 6 months. She has been off ezetimibe  for a few weeks due to GI illness. We will have her restart Zetia  at 5 mg and aim increase to a full 10 mg three days a week. Advised her to let me know if she does not tolerate. Continue to focus on heart healthy mostly plant based diet avoiding saturated fat, processed foods, simple carbohydrates, and sugar along with aiming for at least 150 minutes of moderate intensity exercise each week.   Aortic atherosclerosis/Elevated LP(a) Aortic atherosclerosis noted on prior CT. LP(a) initially elevated at 281.4 on 09/02/2022 with improvement to 183.9 on 01/02/2023 while on PCSK9i which unfortunately she did not  tolerate. EKG today reveals NSR with no acute abnormality. She denies chest pain, dyspnea, or other symptoms concerning for angina.  No indication for further ischemic evaluation at this time.   Fibromyalgia   Chronic fibromyalgia causes severe pain impacting her quality of life. Statins exacerbate symptoms. She prioritizes quality of life over longevity. Ashwagandha has been recommended by functional medicine provider for anti-inflammatory benefits. Advised no known cardiac concerns with ashwagandha. Continue low impact exercise and sleep hygiene.   Heart murmur   History of heart murmur. Echo 08/2018 revealed normal heart function and trivial TR and MR. She is asymptomatic. We will continue to monitor clinically for now.         Dispo: 6 months with me  Signed, Rosaline Bane, NP-C

## 2024-06-19 NOTE — Patient Instructions (Addendum)
 Medication Instructions:   RESTART Zetia  one (1) tablet by mouth ( 10 mg ) x 3 days weekly.    *If you need a refill on your cardiac medications before your next appointment, please call your pharmacy*  Lab Work:  Your physician recommends that you return for a FASTING NMR/ALT, fasting after midnight. One week prior to your appointment with Rosaline in December, patient given paperwork today.    If you have labs (blood work) drawn today and your tests are completely normal, you will receive your results only by: MyChart Message (if you have MyChart) OR A paper copy in the mail If you have any lab test that is abnormal or we need to change your treatment, we will call you to review the results.  Testing/Procedures:  None ordered.  Follow-Up: At [facility], you and your health needs are our priority.  As part of our continuing mission to provide you with exceptional heart care, our providers are all part of one team.  This team includes your primary Cardiologist (physician) and Advanced Practice Providers or APPs (Physician Assistants and Nurse Practitioners) who all work together to provide you with the care you need, when you need it.  Your next appointment:   6 month(s)  Provider:   Rosaline Bane, NP    We recommend signing up for the patient portal called MyChart.  Sign up information is provided on this After Visit Summary.  MyChart is used to connect with patients for Virtual Visits (Telemedicine).  Patients are able to view lab/test results, encounter notes, upcoming appointments, etc.  Non-urgent messages can be sent to your provider as well.   To learn more about what you can do with MyChart, go to [email or web address].

## 2024-07-01 ENCOUNTER — Other Ambulatory Visit: Payer: Self-pay | Admitting: Family Medicine

## 2024-07-01 ENCOUNTER — Encounter: Payer: Self-pay | Admitting: Family Medicine

## 2024-07-01 DIAGNOSIS — G4733 Obstructive sleep apnea (adult) (pediatric): Secondary | ICD-10-CM

## 2024-07-01 NOTE — Progress Notes (Signed)
 Home

## 2024-07-09 ENCOUNTER — Ambulatory Visit: Payer: Self-pay

## 2024-07-09 ENCOUNTER — Telehealth: Payer: Self-pay | Admitting: Family Medicine

## 2024-07-09 NOTE — Telephone Encounter (Signed)
  FYI Only or Action Required?: Action required by provider: clinical question for provider.  Patient was last seen in primary care on 06/18/2024 by Kennyth Worth HERO, MD.  Called Nurse Triage reporting Abdominal Cramping.  Symptoms began several weeks ago.  Interventions attempted: Rest, hydration, or home remedies.  Symptoms are: gradually worsening.  Triage Disposition: See PCP Within 2 Weeks  Patient/caregiver understands and will follow disposition?: Yes  Copied from CRM [id number]. Topic: Clinical - Red Word Triage >> Jul 09, 2024  3:19 PM Franky GRADE wrote: Red Word that prompted transfer to Nurse Triage: Patient was seen on 06/18/2024 for stomach issues. Patient believed she was getting better; however, while on a trip last week the symptoms kept occurring on and off. Reason for Disposition  Abdominal pain is a chronic symptom (recurrent or ongoing AND present > 4 weeks)  Answer Assessment - Initial Assessment Questions Patient states that she willing to come into office before appointment for specimen cup for stool sample if needed.  Please contact patient by phone if you would like for her to do this    1. LOCATION: Where does it hurt?      Lower abdominal apin 2. RADIATION: Does the pain shoot anywhere else? (e.g., chest, back)     denies 3. ONSET: When did the pain begin? (e.g., minutes, hours or days ago)      Seen for food poisoning on 6/24, symptoms were improving, but abdominal cramping and soft and odd smelling stools are returning 4. SUDDEN: Gradual or sudden onset?     Gradual 5. PATTERN Does the pain come and go, or is it constant?     Comes and goes 7. RECURRENT SYMPTOM: Have you ever had this type of stomach pain before? If Yes, ask: When was the last time? and What happened that time?      Yes 8. CAUSE: What do you think is causing the stomach pain? (e.g., gallstones, recent abdominal surgery)     Not sure if residual food poisoning or something  else 9. RELIEVING/AGGRAVATING FACTORS: What makes it better or worse? (e.g., antacids, bending or twisting motion, bowel movement)     Symptoms appears after some meals.  States that she has cramping and very soft stools 10. OTHER SYMPTOMS: Do you have any other symptoms? (e.g., back pain, diarrhea, fever, urination pain, vomiting) Denies nausea and vomiting, but reports soft stools.  Protocols used: Abdominal Pain - Female-A-AH

## 2024-07-09 NOTE — Telephone Encounter (Signed)
 [facility] [employer] pending

## 2024-07-10 NOTE — Telephone Encounter (Signed)
 Appt tomorrow.

## 2024-07-10 NOTE — Telephone Encounter (Signed)
 [facility] [employer] no auth req via fax

## 2024-07-11 ENCOUNTER — Encounter: Payer: Self-pay | Admitting: Family Medicine

## 2024-07-11 ENCOUNTER — Other Ambulatory Visit: Payer: Self-pay | Admitting: Family Medicine

## 2024-07-11 ENCOUNTER — Ambulatory Visit (INDEPENDENT_AMBULATORY_CARE_PROVIDER_SITE_OTHER): Admitting: Family Medicine

## 2024-07-11 VITALS — BP 121/72 | HR 71 | Temp 97.0°F | Ht 62.0 in | Wt 152.4 lb

## 2024-07-11 DIAGNOSIS — E782 Mixed hyperlipidemia: Secondary | ICD-10-CM | POA: Diagnosis not present

## 2024-07-11 DIAGNOSIS — R195 Other fecal abnormalities: Secondary | ICD-10-CM | POA: Diagnosis not present

## 2024-07-11 DIAGNOSIS — K589 Irritable bowel syndrome without diarrhea: Secondary | ICD-10-CM | POA: Diagnosis not present

## 2024-07-11 MED ORDER — DICYCLOMINE HCL 10 MG PO CAPS: 10.0000 mg | ORAL_CAPSULE | Freq: Three times a day (TID) | ORAL | 5 refills | Status: DC

## 2024-07-11 NOTE — Progress Notes (Signed)
   Natalie Erickson is a 73 y.o. female who presents today for an office visit.  Assessment/Plan:  New/Acute Problems: Abdominal cramping / Loose Stools No red flags.  Overall reassuring exam.  May be due to dietary intolerances vs flare up of her IBS however patient states that current symptoms are not consistent with IBS flares.  This has been going on for about a month at this point.  We did discuss importance of high-fiber diet and she will start taking fiber supplement.  Will check stool sample today to rule out infectious etiology.  Will also start low-dose Bentyl  given her known history of IBS.  She will follow-up with us  in a week or so.  If symptoms persist despite above would consider CT scan versus having her follow back up with gastroenterology.  Chronic Problems Addressed Today: IBS (irritable bowel syndrome) She is having more cramping and loose stools as above though she does not think that her current symptoms are consistent with previous flares of IBS.  She has been taking over-the-counter peppermint oil supplement with some improvement.  Will be adding on Bentyl  10 mg 4 times daily as needed and completing GI workup as above.  We discussed reasons to return to care.  Mixed hyperlipidemia She did see cardiology for this a couple of weeks ago and they recommended Zetia  3 times weekly.  She has not been taking this recently due to concern that may be contributing to her above symptoms.     Subjective:  HPI:  Patient is here today for follow up.  I last saw her a few weeks ago for office visit.  At that time she had previously had diarrhea but had resolved by the time of her office visit.  She was concern for potential food poisoning.  She was doing well for a while however over the last week or 2 she has had a recurrence of abdominal cramping and loose stools. Sometimes has about 3 bowel movements. No blood in stool. Some orange coloration. No fevers or chills. She has tried taking  some over the counter supplements with some improvement. Appetite is normal. No nausea or vomiting. Symptoms have been persistent for about a month.        Objective:  Physical Exam: BP 121/72   Pulse 71   Temp (!) 97 F (36.1 C) (Temporal)   Ht 5' 2 (1.575 m)   Wt 152 lb 6.4 oz (69.1 kg)   SpO2 98%   BMI 27.87 kg/m   Gen: No acute distress, resting comfortably CV: Regular rate and rhythm with no murmurs appreciated Pulm: Normal work of breathing, clear to auscultation bilaterally with no crackles, wheezes, or rhonchi Abdomen: No deformities.  Bowel sounds present.  Very minimal tenderness along left lower quadrant and right lower quadrant.  No rebound or guarding. Neuro: Grossly normal, moves all extremities Psych: Normal affect and thought content      Artha Stavros M. Kennyth, MD 07/11/2024 10:50 AM

## 2024-07-11 NOTE — Assessment & Plan Note (Signed)
 She is having more cramping and loose stools as above though she does not think that her current symptoms are consistent with previous flares of IBS.  She has been taking over-the-counter peppermint oil supplement with some improvement.  Will be adding on Bentyl  10 mg 4 times daily as needed and completing GI workup as above.  We discussed reasons to return to care.

## 2024-07-11 NOTE — Patient Instructions (Signed)
 It was very nice to see you today!  Your exam today is reassuring.  We will check a stool sample to make sure that you do not have any sort of gastrointestinal infection.  I think starting the fiber supplement is a great idea.  I will also send a prescription for Bentyl  to use 3-4 times daily as needed.  Please send me a message next week to let us  know how you are doing.  Return if symptoms worsen or fail to improve.   Take care, Dr Kennyth  PLEASE NOTE:  If you had any lab tests, please let us  know if you have not heard back within a few days. You may see your results on mychart before we have a chance to review them but we will give you a call once they are reviewed by us .   If we ordered any referrals today, please let us  know if you have not heard from their office within the next week.   If you had any urgent prescriptions sent in today, please check with the pharmacy within an hour of our visit to make sure the prescription was transmitted appropriately.   Please try these tips to maintain a healthy lifestyle:  Eat at least 3 REAL meals and 1-2 snacks per day.  Aim for no more than 5 hours between eating.  If you eat breakfast, please do so within one hour of getting up.   Each meal should contain half fruits/vegetables, one quarter protein, and one quarter carbs (no bigger than a computer mouse)  Cut down on sweet beverages. This includes juice, soda, and sweet tea.   Drink at least 1 glass of water with each meal and aim for at least 8 glasses per day  Exercise at least 150 minutes every week.

## 2024-07-11 NOTE — Assessment & Plan Note (Signed)
 She did see cardiology for this a couple of weeks ago and they recommended Zetia  3 times weekly.  She has not been taking this recently due to concern that may be contributing to her above symptoms.

## 2024-07-12 ENCOUNTER — Other Ambulatory Visit

## 2024-07-12 ENCOUNTER — Other Ambulatory Visit: Payer: Self-pay | Admitting: Family Medicine

## 2024-07-15 ENCOUNTER — Ambulatory Visit: Payer: Self-pay | Admitting: Family Medicine

## 2024-07-15 LAB — C. DIFFICILE GDH AND TOXIN A/B
GDH ANTIGEN: NOT DETECTED
MICRO NUMBER:: 16717707
SPECIMEN QUALITY:: ADEQUATE
TOXIN A AND B: NOT DETECTED

## 2024-07-15 LAB — HOUSE ACCOUNT TRACKING

## 2024-07-15 NOTE — Progress Notes (Signed)
 C. difficile was negative.  We are still waiting on her GI pathogen panel to come back.

## 2024-07-16 ENCOUNTER — Ambulatory Visit: Payer: Self-pay | Admitting: Family Medicine

## 2024-07-16 LAB — GASTROINTESTINAL PATHOGEN PNL
CampyloBacter Group: NOT DETECTED
Norovirus GI/GII: NOT DETECTED
Rotavirus A: NOT DETECTED
Salmonella species: NOT DETECTED
Shiga Toxin 1: NOT DETECTED
Shiga Toxin 2: NOT DETECTED
Shigella Species: NOT DETECTED
Vibrio Group: NOT DETECTED
Yersinia enterocolitica: NOT DETECTED

## 2024-07-16 LAB — HOUSE ACCOUNT TRACKING

## 2024-07-16 NOTE — Progress Notes (Signed)
 Her GI pathogen panel was negative for any signs of infection.  She would let us  know if her symptoms are not improving with all we discussed at her most recent office visit.

## 2024-07-17 NOTE — Telephone Encounter (Signed)
 FYI

## 2024-07-19 ENCOUNTER — Ambulatory Visit (INDEPENDENT_AMBULATORY_CARE_PROVIDER_SITE_OTHER): Admitting: Neurology

## 2024-07-19 DIAGNOSIS — G4733 Obstructive sleep apnea (adult) (pediatric): Secondary | ICD-10-CM

## 2024-07-25 NOTE — Progress Notes (Signed)
 See procedure note.

## 2024-07-29 NOTE — Procedures (Signed)
 [facility]  HOME SLEEP TEST (SANSA) REPORT (Mail-Out Device):   STUDY DATE: 07/21/2024  DOB: 1951/01/10  MRN: [id number]  ORDERING CLINICIAN: True Mar, MD, PhD   REFERRING CLINICIAN: Greig Forbes, NP  CLINICAL INFORMATION/HISTORY: 73 year old female with an underlying medical history of irritable bowel syndrome, history of heart murmur, fibromyalgia, depression, anxiety, and hyperlipidemia, OSA and obesity, who presents for reevaluation of her OSA.  She has been on AutoPap of 4 to 9 cm with EPR of 1 with compliance and good tolerance of treatment.  She should be eligible for a new machine.  BMI (at the time of sleep clinic visit and/or test date): 31.5 kg/m  FINDINGS:   Study Protocol:    The SANSA single-point-of-skin-contact chest-worn sensor - an FDA cleared and DOT approved type 4 home sleep test device - measures eight physiological channels,  including blood oxygen saturation (measured via PPG [photoplethysmography]), EKG-derived heart rate, respiratory effort, chest movement (measured via accelerometer), snoring, body position, and actigraphy. The device is designed to be worn for up to 10 hours per study.   Sleep Summary:   Total Recording Time (hours, min): 8 hours, 45 min  Total Effective Sleep Time (hours, min):  6 hours, 40 min  Sleep Efficiency (%):    76%   Respiratory Indices:   Calculated sAHI (per hour):  5.2/hour         Oxygen Saturation Statistics:    Oxygen Saturation (%) Mean: 98.1%   Minimum oxygen saturation (%):                 87.5%   O2 Saturation Range (%): 87.5-100%   Time below or at 88% saturation: 0 min   Pulse Rate Statistics:   Pulse Mean (bpm):    61/min    Pulse Range (50-104/min)   Snoring: Intermittent, mild  IMPRESSION/DIAGNOSES:   OSA (obstructive sleep apnea), mild   RECOMMENDATIONS:   This home sleep test demonstrates overall mild obstructive sleep apnea with a total AHI of 5.2/hour and O2  nadir of 87.5%. Snoring was detected, intermittently in the mild range.  This patient has been compliant with her AutoPap of 4 to 9 cm with EPR of 1 with good tolerance and good apnea control.  She should be eligible for a new machine and I would recommend prescribing a new AutoPap machine, keeping her current settings the same.  A full night, in-lab PAP titration study may aid in improving proper treatment settings and with mask fit, if needed, down the road. Alternative treatments may include weight loss (where appropriate) along with avoidance of the supine sleep position (if possible), or an oral appliance in appropriate candidates.   Please note that untreated obstructive sleep apnea may carry additional perioperative morbidity. Patients with significant obstructive sleep apnea should receive perioperative PAP therapy and the surgeons and particularly the anesthesiologist should be informed of the diagnosis and the severity of the sleep disordered breathing. The patient should be cautioned not to drive, work at heights, or operate dangerous or heavy equipment when tired or sleepy. Review and reiteration of good sleep hygiene measures should be pursued with any patient. Other causes of the patient's symptoms, including circadian rhythm disturbances, an underlying mood disorder, medication effect and/or an underlying medical problem cannot be ruled out based on this test. Clinical correlation is recommended.  The patient and her referring provider will be notified of the test results. The patient will be seen in follow up in sleep clinic at [facility], as  necessary.  I certify that I have reviewed the raw data recording prior to the issuance of this report in accordance with the standards of the American Academy of Sleep Medicine (AASM).    INTERPRETING PHYSICIAN:   True Mar, MD, PhD Medical Director, [facility]) Diplomat, ABPN (Neurology and Sleep)    [facility] [address] [place], [place] [address] [phone]

## 2024-07-31 ENCOUNTER — Ambulatory Visit: Payer: Self-pay | Admitting: *Deleted

## 2024-07-31 NOTE — Telephone Encounter (Signed)
 Called pt at [phone]. LVM for her to call about results.  Pt set up with [facility] 09/25/2019. Due for new machine

## 2024-08-01 ENCOUNTER — Telehealth: Payer: Self-pay | Admitting: Internal Medicine

## 2024-08-01 DIAGNOSIS — G4733 Obstructive sleep apnea (adult) (pediatric): Secondary | ICD-10-CM | POA: Diagnosis not present

## 2024-08-01 NOTE — Telephone Encounter (Signed)
 Agree with holding Zetia , however if symptoms persist beyond 2 weeks off Zetia , symptoms not likely related. Would continue recommendations from PCP.

## 2024-08-01 NOTE — Telephone Encounter (Signed)
 Spoke with pt and went over Michelle's recommendations. Explained that if symptoms persist after the 2 week mark of being off of the Zetia , the symptoms are likely unrelated to the medication and she will need to discuss with PCP next steps. Pt verbalized understanding of plan and had no further questions at this time.

## 2024-08-01 NOTE — Telephone Encounter (Signed)
 Pt c/o medication issue:  1. Name of Medication: ezetimibe  (ZETIA ) 10 MG tablet   2. How are you currently taking this medication (dosage and times per day)? Take 1 tablet (10 mg total) by mouth 3 (three) times a week.   3. Are you having a reaction (difficulty breathing--STAT)? No  4. What is your medication issue? Patient is calling because she was taking this medication which caused her to have abdominal cramps and diarrhea. Patient stopped taking this medication one week ago, but is still having symptoms. Please advise.

## 2024-08-01 NOTE — Telephone Encounter (Signed)
 Spoke with pt over the phone and she stated that she had started Zetia  in June and had been having symptoms of diarrhea and cramps since about the end of June. Pt states she went to her PCP and they did stool tests but everything was negative. Pt stated she thought it was the Zetia  and had completely stopped the medication last week but is still having symptoms. Explained to pt that we would send this to Rosaline Bane, NP to review and our office will call her back with any recommendations. Pt verbalized understanding and had no further questions at this time.

## 2024-08-06 ENCOUNTER — Other Ambulatory Visit: Payer: Self-pay | Admitting: Family Medicine

## 2024-08-06 DIAGNOSIS — G4733 Obstructive sleep apnea (adult) (pediatric): Secondary | ICD-10-CM

## 2024-08-07 NOTE — Telephone Encounter (Signed)
 Called [phone]. LVM for her to call about results.

## 2024-08-09 ENCOUNTER — Encounter: Payer: Self-pay | Admitting: Internal Medicine

## 2024-08-09 ENCOUNTER — Ambulatory Visit (INDEPENDENT_AMBULATORY_CARE_PROVIDER_SITE_OTHER): Payer: PPO | Admitting: Internal Medicine

## 2024-08-09 VITALS — BP 120/80 | HR 74 | Ht 62.0 in | Wt 152.0 lb

## 2024-08-09 DIAGNOSIS — E042 Nontoxic multinodular goiter: Secondary | ICD-10-CM

## 2024-08-09 DIAGNOSIS — E0590 Thyrotoxicosis, unspecified without thyrotoxic crisis or storm: Secondary | ICD-10-CM | POA: Diagnosis not present

## 2024-08-09 NOTE — Progress Notes (Signed)
 Name: Natalie Erickson  MRN/ DOB: [id number], 07-08-1951    Age/ Sex: 73 y.o., female     PCP: Kennyth Worth HERO, MD   Reason for Endocrinology Evaluation: MNG     Initial Endocrinology Clinic Visit: 09/10/2019    PATIENT IDENTIFIER: Natalie Erickson is a 73 y.o., female with a past medical history of GAD, fibromyalgia , OSA on CPAP and dyslipidemia. She has followed with [facility] since 09/09/2020 for consultative assistance with management of her MNG.   HISTORICAL SUMMARY:   Pt was found to have an incidental thyroid  nodules on MRI 06/2019 during evaluation of a left neck pain with radiculopathy.      An ultrasound 07/2019 confirmed diagnosis of multiple thyroid  nodules, with the largest being 3.3 cm at the left middle thyroid  lobe . She is S/P benign FNA of the left mid thyroid  nodule on 09/17/2019     No FH of thyroid  disease   SUBJECTIVE:    Today (08/09/2024):  Natalie Erickson is here for a follow up on MNG.   She was evaluated by PCP for IBS and loose stools, that was attributed to Zetia   She was seen by podiatry for onychomycosis in April, 2025  Patient has been noted weight loss over the past year No local neck swelling  Had recent CPAP analysis with improvement Continues with occasional palpitations  Denies tremors  Biotin on hold       HISTORY:  Past Medical History:  Past Medical History:  Diagnosis Date   AC (acromioclavicular) joint bone spurs    c 5 and c6 and thoracic   Allergy    food allergies   Anxiety 04/06/2017   Benign thyroid  cyst sept 2020 dx   Cataract    bilateral   Cystitis    Depression    Dry eyes 04/06/2017   Elevated BP without diagnosis of hypertension 04/06/2017   lost weight >> BP improved   Fibromyalgia    Floaters    both eyes   Heart murmur    hx of 2 Echocardiograms in [place]  (pt was told they were normal)   Hiatal hernia    Hip pain    and groin pain for a while   History of colon polyps    History  of echocardiogram    Echo 9/19: mild LVH, EF 60-65, no RWMA, Gr 1 DD, trivial MR/TR, PASP 16   History of kidney stones    History of Lyme disease    Hypercalcemia    causes kidney stones, none recent   IBS (irritable bowel syndrome)    Mixed hyperlipidemia 04/06/2017   intol to statins due to myalgias; never tried Zetia    Occipital neuralgia    Sleep apnea    mild to moderate , uses cpap   TMJ (dislocation of temporomandibular joint)    UTI (urinary tract infection) finishes antibiotic 03-27-2020   Vasomotor rhinitis    Past Surgical History:  Past Surgical History:  Procedure Laterality Date   APPENDECTOMY  1959   COLONOSCOPY  2016   [facility]   COLONOSCOPY  2011   in [place]     EVALUATION UNDER ANESTHESIA WITH HEMORRHOIDECTOMY N/A 04/02/2020   Procedure: ANORECTAL EXAM UNDER ANESTHESIA WITH HEMORRHOIDECTOMY, HEMORRHOIDAL LIGATION;  Surgeon: Sheldon Standing, MD;  Location: [facility];  Service: General;  Laterality: N/A;  GENERAL AND LOCAL   left ovarin cyst removed  age 38's   grapefruit sized, exp laparotomy   POLYPECTOMY  UPPER GASTROINTESTINAL ENDOSCOPY     UPPER GI ENDOSCOPY     Social History:  reports that she has never smoked. She has never used smokeless tobacco. She reports that she does not drink alcohol and does not use drugs. Family History:  Family History  Problem Relation Age of Onset   Healthy Mother    Hypertension Mother    Healthy Father    Lung cancer Father    Breast cancer Sister    Diabetes Paternal Grandmother    Colon cancer Neg Hx    Stomach cancer Neg Hx    Heart attack Neg Hx    Heart failure Neg Hx    Pancreatic cancer Neg Hx    Colon polyps Neg Hx    Esophageal cancer Neg Hx    Rectal cancer Neg Hx      HOME MEDICATIONS: Allergies as of 08/09/2024       Reactions   Diltiazem  Shortness Of Breath   Metoprolol  Shortness Of Breath   Statins Tinitus   Achy joints, muscle aches, nerve pain   Chocolate Other  (See Comments)   migraine   Cocoa Other (See Comments)   migraine migraine   Codeine Nausea And Vomiting   Cranberry Other (See Comments)   Cystitis   Latex Rash   Monosodium Glutamate Other (See Comments)   Migraine   Penicillins Rash   Has patient had a PCN reaction causing immediate rash, facial/tongue/throat swelling, SOB or lightheadedness with hypotension: Yes Has patient had a PCN reaction causing severe rash involving mucus membranes or skin necrosis: No Has patient had a PCN reaction that required hospitalization No Has patient had a PCN reaction occurring within the last 10 years: No If all of the above answers are NO, then may proceed with Cephalosporin use.   Antihistamines, Loratadine-type    Throat swelling   Carvedilol  Other (See Comments)   N&N, SOB , Head ache and joint pain.    Ginger    Gluten Meal    Lexapro  [escitalopram ]    GI    Montelukast  Other (See Comments)   Throat swelling   Other Swelling   Throat swelling   Prednisone  Swelling   Throat swelling (no difficulty breathing)   Repatha  [evolocumab ] Other (See Comments)   Worsening IBS, knee pain   Sulfites Other (See Comments)   Nausea and headaches   Vanilla    Whey    Zofran  [ondansetron  Hcl] Other (See Comments)   Muscle cramps, leg tingling   Azithromycin Other (See Comments), Diarrhea   Egg-derived Products Other (See Comments)   States exacerbates her IBS symptoms        Medication List        Accurate as of August 09, 2024 10:23 AM. If you have any questions, ask your nurse or doctor.          acetaminophen  500 MG tablet Commonly known as: TYLENOL  Take 1,000 mg by mouth every 6 (six) hours as needed for headache (pain).   ciclopirox  8 % solution Commonly known as: PENLAC  Apply topically at bedtime. Apply over nail and surrounding skin. Apply daily over previous coat. After seven (7) days, may remove with alcohol and continue cycle.   co-enzyme Q-10 30 MG capsule Take  100 mg by mouth daily.   dicyclomine  10 MG capsule Commonly known as: BENTYL  Take 1 capsule (10 mg total) by mouth 4 (four) times daily -  before meals and at bedtime.   ezetimibe  10 MG tablet Commonly known  as: ZETIA  Take 1 tablet (10 mg total) by mouth 3 (three) times a week.   gabapentin  100 MG capsule Commonly known as: NEURONTIN  Take by mouth. What changed:  when to take this reasons to take this   Magnesium Glycinate Powd 120 mg by Does not apply route as needed.   omega-3 acid ethyl esters 1 g capsule Commonly known as: LOVAZA Take 1,000 mg by mouth 2 (two) times daily.   polyethylene glycol powder 17 GM/SCOOP powder Commonly known as: GLYCOLAX/MIRALAX Take 17-34 g by mouth as needed.   PRESERVISION AREDS PO Take 1 tablet by mouth daily.   SYSTANE ULTRA OP Apply to eye at bedtime.   Vitamin D  50 MCG (2000 UT) tablet Take 2,000 Units by mouth daily.         OBJECTIVE:   PHYSICAL EXAM: VS: BP 120/80 (BP Location: Left Arm, Patient Position: Sitting, Cuff Size: Normal)   Pulse 74   Ht 5' 2 (1.575 m)   Wt 152 lb (68.9 kg)   SpO2 99%   BMI 27.80 kg/m    EXAM: General: Pt appears well and is in NAD  Neck:  Thyroid : Unable to appreciate any goiter or nodules on today's exam  Lungs: Clear with good BS bilat  Heart: Auscultation: RRR.  Extremities:  BL LE: No pretibial edema   Mental Status: Judgment, insight: Intact Orientation: Oriented to time, place, and person Mood and affect: No depression, anxiety, or agitation     DATA REVIEWED:      Latest Reference Range & Units 02/13/23 08:39  Thyroglobulin Ab < or = 1 IU/mL <1  Thyroperoxidase Ab SerPl-aCnc <9 IU/mL <1    Thyroid  Ultrasound 10/13/2023    FINDINGS: Parenchymal Echotexture: Mildly heterogenous   Isthmus: 0.3 cm   Right lobe: 4.5 x 1.6 x 1.9 cm   Left lobe: 5.1 x 2.2 x 2.5 cm   _________________________________________________________   Estimated total number of  nodules >/= 1 cm: 3   Number of spongiform nodules >/=  2 cm not described below (TR1): 0   Number of mixed cystic and solid nodules >/= 1.5 cm not described below (TR2): 0   _________________________________________________________   Nodule # 1: Stable appearance of hypoechoic solid nodule in the superior aspect of the thyroid  isthmus at 1.0 x 0.9 x 0.3 cm. No interval change. Findings remain consistent with TI-RADS category 4. *Given size (>/= 1 - 1.4 cm) and appearance, a follow-up ultrasound in 1 year should be considered based on TI-RADS criteria.   Nodule # 2: Vague pseudo nodule within the right mid gland. No imaging follow-up recommended.   Nodule # 3: Previously biopsied nodule in the left mid gland measures slightly smaller at 3.5 x 2.2 x 1.9 cm.   IMPRESSION: 1. Confirmed 1 year stability of 1 cm TI-RADS category 4 nodule in the superior thyroid  isthmus. Recommend continued annual surveillance imaging in till 5 years of stability is confirmed. 2. Decreasing size of previously biopsied nodule in the left mid thyroid  gland. Involution over time is consistent with a benign lesion.     FNA 09/17/2019 Clinical History: Left mid 3.3cm; Other 2 dimensions: 2.3 x 1.7cm, Solid  DIAGNOSIS:  - Consistent with benign follicular nodule (Bethesda category II)   ASSESSMENT / PLAN / RECOMMENDATIONS:   Multinodular Goiter:  - Pt is clinically euthyroid - No local neck symptoms  - S/P benign FNA of the left mid nodule (08/2019) - Repeat thyroid  ultrasound in 2024 revealed decrease in size of  previously biopsied nodule in the left mid thyroid  gland, but she did have a 1 cm TI-RADS category 4 nodule in the superior thyroid  isthmus which met criteria for continued follow-up. -New ultrasound orders was placed today - TFTs pending today   F/U in 1 yr    Signed electronically by: Stefano Redgie Butts, MD  [facility]  [facility] [address]  [place] [place], [place] [address] Phone: [phone] FAX: [phone]      CC: Kennyth Worth HERO, MD [address] [place] [address] Phone: [phone]  Fax: [phone]   Return to Endocrinology clinic as below: Future Appointments  Date Time Provider Department Center  08/09/2024 10:30 AM Tristan Proto, Donell Redgie, MD [facility] None  12/09/2024 11:20 AM Swinyer, Rosaline HERO, NP [facility]

## 2024-08-10 LAB — T3, FREE: T3, Free: 3.9 pg/mL (ref 2.3–4.2)

## 2024-08-10 LAB — T4, FREE: Free T4: 1.3 ng/dL (ref 0.8–1.8)

## 2024-08-10 LAB — TSH: TSH: 0.03 m[IU]/L — ABNORMAL LOW (ref 0.40–4.50)

## 2024-08-12 ENCOUNTER — Ambulatory Visit: Payer: Self-pay | Admitting: Internal Medicine

## 2024-08-12 DIAGNOSIS — E0590 Thyrotoxicosis, unspecified without thyrotoxic crisis or storm: Secondary | ICD-10-CM | POA: Insufficient documentation

## 2024-08-12 NOTE — Telephone Encounter (Signed)
 Please contact the pt and schedule her for a lab appointment in 6 weeks and follow up with me in 6 months ( Instead of 1 yr)   Thanks

## 2024-08-12 NOTE — Addendum Note (Signed)
 Addended by: SAM DONELL PARAS on: 08/12/2024 08:57 PM   Modules accepted: Level of Service

## 2024-08-15 ENCOUNTER — Ambulatory Visit
Admission: RE | Admit: 2024-08-15 | Discharge: 2024-08-15 | Disposition: A | Discharge status: Home / Self Care | Source: Ambulatory Visit | Attending: Internal Medicine | Admitting: Internal Medicine

## 2024-08-15 DIAGNOSIS — E042 Nontoxic multinodular goiter: Secondary | ICD-10-CM

## 2024-08-15 NOTE — Telephone Encounter (Signed)
 Community message sent to [employer] that order placed.

## 2024-08-16 ENCOUNTER — Telehealth: Payer: Self-pay

## 2024-08-16 ENCOUNTER — Other Ambulatory Visit: Payer: Self-pay | Admitting: Internal Medicine

## 2024-08-16 DIAGNOSIS — E042 Nontoxic multinodular goiter: Secondary | ICD-10-CM

## 2024-08-16 NOTE — Telephone Encounter (Signed)
 Patient states that her lab work may be off because she was eating a lot of sushi with seaweed.  Patient would like to do lab work at [facility].

## 2024-09-16 ENCOUNTER — Telehealth: Payer: Self-pay | Admitting: *Deleted

## 2024-09-16 NOTE — Telephone Encounter (Signed)
 Called and LVM for pt to call and schedule this appt between the dates of 10/20-12/18.

## 2024-09-16 NOTE — Telephone Encounter (Signed)
 Phone room: Please call pt to schedule appt between 10/14/24-12/12/24 with Dr. Buck or Amy Lomax,NP

## 2024-09-16 NOTE — Telephone Encounter (Signed)
 Pt returned call and was scheduled. Had to use a Hosp slot due to time sensitivity and nothing else being available.

## 2024-09-20 DIAGNOSIS — E042 Nontoxic multinodular goiter: Secondary | ICD-10-CM | POA: Diagnosis not present

## 2024-09-21 LAB — TSH: TSH: 0.054 u[IU]/mL — ABNORMAL LOW (ref 0.450–4.500)

## 2024-09-21 LAB — T4, FREE: Free T4: 1.25 ng/dL (ref 0.82–1.77)

## 2024-09-23 ENCOUNTER — Ambulatory Visit: Payer: Self-pay | Admitting: Internal Medicine

## 2024-09-23 DIAGNOSIS — E042 Nontoxic multinodular goiter: Secondary | ICD-10-CM

## 2024-09-25 ENCOUNTER — Telehealth: Payer: Self-pay

## 2024-09-25 MED ORDER — METHIMAZOLE 5 MG PO TABS: 5.0000 mg | ORAL_TABLET | Freq: Every day | ORAL | 1 refills | Status: DC

## 2024-09-25 NOTE — Telephone Encounter (Signed)
 Spoke to the patient on 09/25/2024 at 12:45 PM    Discussed persistent suppressed TSH, most likely due to autonomous thyroid  nodule   We detained the idea of thyroid  uptake and scan, but since this is not going to change her management at this time we opted to proceed with methimazole  Patient has chronic and stable palpitations, no tremors, she did have diarrhea with that year but this has resolved with discontinuation of Zetia , overall the patient is asymptomatic   Cautioned against allergic reaction and arthralgias  Caution against hepatic and bone marrow injury  Patient will have labs done at [facility] on [address] in [place]  She is already scheduled for an appointment with me in March

## 2024-09-25 NOTE — Telephone Encounter (Signed)
 Lab order has been sent to [facility] on [address]

## 2024-10-01 ENCOUNTER — Telehealth: Payer: Self-pay | Admitting: Internal Medicine

## 2024-10-01 NOTE — Telephone Encounter (Signed)
 Patient called to advise that she is experiencing diarrhea from the Methimazole. She is asking if it is ok for her to take Loperamide. Requesting a call back at [phone]

## 2024-10-07 ENCOUNTER — Ambulatory Visit (HOSPITAL_BASED_OUTPATIENT_CLINIC_OR_DEPARTMENT_OTHER): Admitting: Nurse Practitioner

## 2024-10-07 DIAGNOSIS — E0590 Thyrotoxicosis, unspecified without thyrotoxic crisis or storm: Secondary | ICD-10-CM

## 2024-10-21 ENCOUNTER — Encounter (HOSPITAL_COMMUNITY)

## 2024-10-22 ENCOUNTER — Encounter (HOSPITAL_COMMUNITY)

## 2024-10-23 LAB — OPHTHALMOLOGY REPORT-SCANNED

## 2024-10-24 ENCOUNTER — Telehealth: Payer: Self-pay | Admitting: Family Medicine

## 2024-10-24 ENCOUNTER — Encounter: Payer: Self-pay | Admitting: Family Medicine

## 2024-10-24 NOTE — Telephone Encounter (Signed)
 LVM and sent mychart msg informing pt of need to reschedule 11/05/24 appt - NP out   If patient calls back you can offer a slot with Amy on 11/06/24

## 2024-10-28 ENCOUNTER — Encounter (HOSPITAL_COMMUNITY)
Admission: RE | Admit: 2024-10-28 | Discharge: 2024-10-28 | Disposition: A | Discharge status: Home / Self Care | Source: Ambulatory Visit | Attending: Internal Medicine | Admitting: Internal Medicine

## 2024-10-28 DIAGNOSIS — E0590 Thyrotoxicosis, unspecified without thyrotoxic crisis or storm: Secondary | ICD-10-CM | POA: Insufficient documentation

## 2024-10-29 ENCOUNTER — Encounter (HOSPITAL_COMMUNITY)
Admission: RE | Admit: 2024-10-29 | Discharge: 2024-10-29 | Disposition: A | Discharge status: Home / Self Care | Source: Ambulatory Visit | Attending: Internal Medicine | Admitting: Internal Medicine

## 2024-10-29 ENCOUNTER — Ambulatory Visit: Payer: Self-pay | Admitting: Internal Medicine

## 2024-10-29 MED ORDER — SODIUM IODIDE I-123 7.4 MBQ CAPS
412.0000 | ORAL_CAPSULE | Freq: Once | ORAL | Status: AC
  Administered 2024-10-28: 412 via ORAL

## 2024-10-30 ENCOUNTER — Encounter: Payer: Self-pay | Admitting: Internal Medicine

## 2024-10-30 ENCOUNTER — Other Ambulatory Visit

## 2024-10-30 ENCOUNTER — Ambulatory Visit (INDEPENDENT_AMBULATORY_CARE_PROVIDER_SITE_OTHER): Admitting: Internal Medicine

## 2024-10-30 VITALS — BP 138/82 | HR 92 | Ht 62.0 in | Wt 149.0 lb

## 2024-10-30 DIAGNOSIS — E0590 Thyrotoxicosis, unspecified without thyrotoxic crisis or storm: Secondary | ICD-10-CM

## 2024-10-30 DIAGNOSIS — L723 Sebaceous cyst: Secondary | ICD-10-CM | POA: Insufficient documentation

## 2024-10-30 DIAGNOSIS — E042 Nontoxic multinodular goiter: Secondary | ICD-10-CM | POA: Diagnosis not present

## 2024-10-30 NOTE — Progress Notes (Unsigned)
 Name: Natalie Erickson  MRN/ DOB: [id number], 10-Dec-1951    Age/ Sex: 73 y.o., female     PCP: Kennyth Worth HERO, MD   Reason for Endocrinology Evaluation: MNG     Initial Endocrinology Clinic Visit: 09/10/2019    PATIENT IDENTIFIER: Ms. Natalie Erickson is a 73 y.o., female with a past medical history of GAD, fibromyalgia , OSA on CPAP and dyslipidemia. She has followed with [facility] since 09/09/2020 for consultative assistance with management of her MNG.   HISTORICAL SUMMARY:   Pt was found to have an incidental thyroid  nodules on MRI 06/2019 during evaluation of a left neck pain with radiculopathy.      An ultrasound 07/2019 confirmed diagnosis of multiple thyroid  nodules, with the largest being 3.3 cm at the left middle thyroid  lobe . She is S/P benign FNA of the left mid thyroid  nodule on 09/17/2019     No FH of thyroid  disease   Started methimazole October, 2025 due to persistent suppressed TSH   She initially reported diarrhea prior to starting methimazole that she attributed to Zetia .  She contacted the office a week after starting methimazole complaining of diarrhea that she attributed to methimazole, she also developed myalgias that she attributed to methimazole.    Thyroid  uptake and scan 10/2024 suggestive of hyperfunctioning left thyroid  nodule  SUBJECTIVE:    Today (10/30/2024):  [name] is here for a follow up on MNG.   She was evaluated by PCP for IBS and loose stools, that was attributed to Zetia    She restarted methimazole last night , she was taking half twice a day to avoid side effects  She did have sensitive teeth and left ear pain  She has little neck pain, no ear pain  Has headache   She continues with loose stools that she attributes to stress She has fibromyalgia , exercise and yoga helps with morning pain  Has back pain due to OA  No local neck swelling  Has occasional sleep difficulty   No Biotin    Methimazole 5 mg daily     HISTORY:  Past Medical History:  Past Medical History:  Diagnosis Date   AC (acromioclavicular) joint bone spurs    c 5 and c6 and thoracic   Allergy    food allergies   Anxiety 04/06/2017   Benign thyroid  cyst sept 2020 dx   Cataract    bilateral   Cystitis    Depression    Dry eyes 04/06/2017   Elevated BP without diagnosis of hypertension 04/06/2017   lost weight >> BP improved   Fibromyalgia    Floaters    both eyes   Heart murmur    hx of 2 Echocardiograms in [place]  (pt was told they were normal)   Hiatal hernia    Hip pain    and groin pain for a while   History of colon polyps    History of echocardiogram    Echo 9/19: mild LVH, EF 60-65, no RWMA, Gr 1 DD, trivial MR/TR, PASP 16   History of kidney stones    History of Lyme disease    Hypercalcemia    causes kidney stones, none recent   IBS (irritable bowel syndrome)    Mixed hyperlipidemia 04/06/2017   intol to statins due to myalgias; never tried Zetia    Occipital neuralgia    Sleep apnea    mild to moderate , uses cpap   TMJ (dislocation of temporomandibular joint)  UTI (urinary tract infection) finishes antibiotic 03-27-2020   Vasomotor rhinitis    Past Surgical History:  Past Surgical History:  Procedure Laterality Date   APPENDECTOMY  1959   COLONOSCOPY  2016   [place]   COLONOSCOPY  2011   in [place]     EVALUATION UNDER ANESTHESIA WITH HEMORRHOIDECTOMY N/A 04/02/2020   Procedure: ANORECTAL EXAM UNDER ANESTHESIA WITH HEMORRHOIDECTOMY, HEMORRHOIDAL LIGATION;  Surgeon: Sheldon Standing, MD;  Location: [facility];  Service: General;  Laterality: N/A;  GENERAL AND LOCAL   left ovarin cyst removed  age 31's   grapefruit sized, exp laparotomy   POLYPECTOMY     UPPER GASTROINTESTINAL ENDOSCOPY     UPPER GI ENDOSCOPY     Social History:  reports that she has never smoked. She has never used smokeless tobacco. She reports that she does not drink alcohol and does not use  drugs. Family History:  Family History  Problem Relation Age of Onset   Healthy Mother    Hypertension Mother    Healthy Father    Lung cancer Father    Breast cancer Sister    Diabetes Paternal Grandmother    Colon cancer Neg Hx    Stomach cancer Neg Hx    Heart attack Neg Hx    Heart failure Neg Hx    Pancreatic cancer Neg Hx    Colon polyps Neg Hx    Esophageal cancer Neg Hx    Rectal cancer Neg Hx      HOME MEDICATIONS: Allergies as of 10/30/2024       Reactions   Diltiazem  Shortness Of Breath   Metoprolol  Shortness Of Breath   Statins Tinitus   Achy joints, muscle aches, nerve pain   Chocolate Other (See Comments)   migraine   Cocoa Other (See Comments)   migraine migraine   Codeine Nausea And Vomiting   Cranberry Other (See Comments)   Cystitis   Latex Rash   Monosodium Glutamate Other (See Comments)   Migraine   Penicillins Rash   Has patient had a PCN reaction causing immediate rash, facial/tongue/throat swelling, SOB or lightheadedness with hypotension: Yes Has patient had a PCN reaction causing severe rash involving mucus membranes or skin necrosis: No Has patient had a PCN reaction that required hospitalization No Has patient had a PCN reaction occurring within the last 10 years: No If all of the above answers are NO, then may proceed with Cephalosporin use.   Antihistamines, Loratadine-type    Throat swelling   Carvedilol  Other (See Comments)   N&N, SOB , Head ache and joint pain.    Ginger    Gluten Meal    Lexapro  [escitalopram ]    GI    Montelukast  Other (See Comments)   Throat swelling   Other Swelling   Throat swelling   Prednisone  Swelling   Throat swelling (no difficulty breathing)   Repatha  [evolocumab ] Other (See Comments)   Worsening IBS, knee pain   Sulfites Other (See Comments)   Nausea and headaches   Vanilla    Whey    Zetia  [ezetimibe ]    Zofran  [ondansetron  Hcl] Other (See Comments)   Muscle cramps, leg tingling    Azithromycin Other (See Comments), Diarrhea   Egg Protein-containing Drug Products Other (See Comments)   States exacerbates her IBS symptoms        Medication List        Accurate as of October 30, 2024  1:47 PM. If you have any questions, ask your nurse  or doctor.          STOP taking these medications    ezetimibe  10 MG tablet Commonly known as: ZETIA  Stopped by: Adelheid Hoggard J Christen Bedoya       TAKE these medications    acetaminophen  500 MG tablet Commonly known as: TYLENOL  Take 1,000 mg by mouth every 6 (six) hours as needed for headache (pain).   ciclopirox  8 % solution Commonly known as: PENLAC  Apply topically at bedtime. Apply over nail and surrounding skin. Apply daily over previous coat. After seven (7) days, may remove with alcohol and continue cycle.   co-enzyme Q-10 30 MG capsule Take 100 mg by mouth daily.   dicyclomine  10 MG capsule Commonly known as: BENTYL  Take 1 capsule (10 mg total) by mouth 4 (four) times daily -  before meals and at bedtime.   gabapentin  100 MG capsule Commonly known as: NEURONTIN  Take by mouth. What changed:  when to take this reasons to take this   Magnesium Glycinate Powd 120 mg by Does not apply route as needed.   methimazole 5 MG tablet Commonly known as: TAPAZOLE Take 1 tablet (5 mg total) by mouth daily.   omega-3 acid ethyl esters 1 g capsule Commonly known as: LOVAZA Take 1,000 mg by mouth 2 (two) times daily.   polyethylene glycol powder 17 GM/SCOOP powder Commonly known as: GLYCOLAX/MIRALAX Take 17-34 g by mouth as needed.   PRESERVISION AREDS PO Take 1 tablet by mouth daily.   SYSTANE ULTRA OP Apply to eye at bedtime.   Vitamin D  50 MCG (2000 UT) tablet Take 2,000 Units by mouth daily.         OBJECTIVE:   PHYSICAL EXAM: VS: BP 138/82   Pulse 92   Ht 5' 2 (1.575 m)   Wt 149 lb (67.6 kg)   BMI 27.25 kg/m    EXAM: General: Pt appears well and is in NAD  Neck:  Thyroid : Unable to  appreciate any goiter or nodules on today's exam  Lungs: Clear with good BS bilat  Heart: Auscultation: RRR.  Extremities:  BL LE: No pretibial edema   Mental Status: Judgment, insight: Intact Orientation: Oriented to time, place, and person Mood and affect: No depression, anxiety, or agitation     DATA REVIEWED:     Latest Reference Range & Units 02/13/23 08:39  Thyroglobulin Ab < or = 1 IU/mL <1  Thyroperoxidase Ab SerPl-aCnc <9 IU/mL <1    Thyroid  Ultrasound 08/15/2024     COMPARISON:  Prior thyroid  ultrasound 10/13/2023; 09/05/2022   FINDINGS: Parenchymal Echotexture: Mildly heterogenous   Isthmus: 0.3 cm   Right lobe: 4.3 x 1.2 x 1.5 cm   Left lobe: 5.0 x 2.5 x 2.0 cm   _________________________________________________________   Estimated total number of nodules >/= 1 cm: 1   Number of spongiform nodules >/=  2 cm not described below (TR1): 0   Number of mixed cystic and solid nodules >/= 1.5 cm not described below (TR2): 0   _________________________________________________________   Nodule # 1: Small hypoechoic nodule in the thyroid  isthmus has involuted since the prior study and now measures no more than 0.8 x 0.6 x 0.3 cm. This lesion no longer meets criteria for continued imaging surveillance.   Nodule # 2: Subcentimeter nodule in the right mid gland. No further follow-up.   Nodule # 3: The previously biopsied nodule in the left mid gland is unchanged in size at 3.3 x 1.9 x 2.6 cm compared to 3.5 x 1.9  x 2.2 cm previously.   IMPRESSION: 1. No significant interval change in the size or appearance of the previously biopsied nodule in the left mid gland. 2. The TI-RADS category 4 nodule in the thyroid  isthmus has involuted since the prior study and no longer meets criteria for continued imaging surveillance. 3. No new nodules or suspicious features.      FNA 09/17/2019 Clinical History: Left mid 3.3cm; Other 2 dimensions: 2.3 x 1.7cm, Solid   DIAGNOSIS:  - Consistent with benign follicular nodule (Bethesda category II)      Thyroid  uptake and scan 10/29/2024  FINDINGS:   Thyroid  uptake at 24 hours measured 42.3%. Normal range at 24 hours is 10-35%.   Large warm nodule of the left lobe of the thyroid  gland measures approximately 3 cm. There is suppression of the right lobe of the thyroid  gland.   IMPRESSION: 1. Findings most suggestive of a large hyperfunctioning left lobe thyroid  nodule. 2. Elevated I-131 uptake.    ASSESSMENT / PLAN / RECOMMENDATIONS:   Multinodular Goiter:  - Pt is clinically euthyroid - No local neck symptoms  - S/P benign FNA of the left mid nodule (08/2019) - Repeat thyroid  ultrasound 07/2024 showed involution of the isthmic nodule, and stable left thyroid  nodule - Uptake and scan confirmed hyperfunctioning left thyroid  nodule - Patient is interested in radiofrequency ablation if needed in the future, patient is not interested in surgical nor RAI ablation    2. Subclinical Hyperthyroidism :   - I started on methimazole October, 2025 but she developed myalgias and diarrhea -Of note, the patient was having loose stools prior to starting methimazole that she attributed to Zetia  - Patient would like to continue on the methimazole, she has been dividing the dose into half a tablet twice daily - I explained to the patient, I am not sure her symptoms are related to methimazole per se - Patient is willing to proceed with radiofrequency ablation if needed in the future - She was provided with printed orders to take to [facility] if needed   Medication Methimazole 5 mg daily(she takes half a tablet twice daily)    F/U in 4 months    I spent 35 minutes preparing to see the patient by review of recent labs, imaging and procedures, obtaining and reviewing separately obtained history, communicating with the patient/family or caregiver, ordering medications, tests or procedures, and documenting  clinical information in the EHR including the differential Dx, treatment, and any further evaluation and other management    Signed electronically by: Stefano Redgie Butts, MD  [facility]  [facility] [address] [place] [place], [place] [address] Phone: [phone] FAX: [phone]      CC: Kennyth Worth HERO, MD [address] [place] [address] Phone: [phone]  Fax: [phone]   Return to Endocrinology clinic as below: Future Appointments  Date Time Provider Department Center  11/06/2024  1:30 PM Cary No, NP [facility] None  12/09/2024 11:20 AM Swinyer, Rosaline HERO, NP [id number] [id number]  02/25/2025 11:30 AM Charisma Charlot, Donell Redgie, MD [id number] None

## 2024-10-31 ENCOUNTER — Ambulatory Visit: Payer: Self-pay | Admitting: Internal Medicine

## 2024-10-31 LAB — TSH: TSH: 0.52 m[IU]/L (ref 0.40–4.50)

## 2024-10-31 LAB — T4, FREE: Free T4: 1.1 ng/dL (ref 0.8–1.8)

## 2024-10-31 LAB — T3, FREE: T3, Free: 3.2 pg/mL (ref 2.3–4.2)

## 2024-10-31 MED ORDER — METHIMAZOLE 5 MG PO TABS: 2.5000 mg | ORAL_TABLET | Freq: Every day | ORAL | Status: DC

## 2024-10-31 NOTE — Telephone Encounter (Signed)
 Please let the patient know that her thyroid  function is normal, which is great news.  I would suggest taking half a tablet of the methimazole from now on daily    Patient will need to have labs rechecked through [facility] in early January   I had already printed [facility] orders for her so she just needs to take them to [facility] the first week of January or sometime around that time    Thanks

## 2024-10-31 NOTE — Progress Notes (Signed)
 Chief Complaint  Patient presents with   Sleep Apnea    Rm 2 alone Pt is well and stable, reports no OSA/CPAP concerns     HISTORY OF PRESENT ILLNESS:  11/06/24 ALL: Judi returns for follow up for OSA on CPAP. She was eligible for a new machine 07/2024. HST 06/2024 showed mild obstructive sleep apnea with a total AHI of 5.2/hour and O2 nadir of 87.5%. New autoPAP ordered. Since, she reports doing well with new machine. She is using it nightly for about 7 hours, on average. She denies concerns with machine or supplies.     01/03/2024 ALL:  Jayel returns for follow up for OSA on CPAP. She continues to do well with therapy. She is using CPAP nightly for about 7-8 hours, on average. She does have some dryness during winter months. She will be eligible for new machine 07/2024.     12/14/2022 ALL:  Taniqua returns for follow up for OSA on CPAP. She continues to do well on therapy. She is using CPAP nightly for about 6.5 hours. She denies concerns with machine or supplies. She continues to see [facility] for DDD, fibromyalgia and occipital neuralgia. She reports no longer taking gabapentin . Occipital neuralgia has been stable with rare pain. Fibromyalgia pain waxes and wanes.     12/202/2022 ALL: Jamee returns for follow up for OSA on CPAP. She continues to do fairly well. She has not used CPAP for as long as she used to due to more IBS symptoms. She feels better when she lies on her left side. She is caring for her aging dog. She is up and doesn't with her at night. She is using a nasal pillow. She feels that she get frustrated with it but not interested in trying a new mask.   ON is well managed. She uses gabapentin  only as needed.     12/10/2020 ALL:  Cele Mote is a 73 y.o. female here today for follow up for OSA on CPAP therapy.  She reports that she is doing very well in therapy.  She does feel that it helps with daytime sleepiness.  She is currently seeing [facility], Dr. Cleotilde, who is retiring at the end of the year.  She is treated with low-dose gabapentin  for occipital neuralgia.  She reports that she is doing very well.  She would like to continue follow-up for both sleep and occipital neuralgia with me.  Compliance report dated 11/09/2020 through 12/08/2020 reveals that she used CPAP 28 of the past 30 days for compliance of 93%.  She used CPAP greater than 4 hours 27 of the past 30 days for compliance of 90%.  Average usage on days used was 6 hours and 55 minutes.  Residual AHI was 0.9 on 4 to 9 cm of water and an EPR of 1.  There was no significant leak noted.   HISTORY (copied from previous note)  Gerlean Cid is a 73 y.o. female here today for follow up of OSA on CPAP.  Reda is doing very well on CPAP therapy.  She is using CPAP nightly.  She does note significant improvement in daytime sleepiness.  She has less fatigue throughout the day.  She notes improvement in occipital neuralgia.  She is currently managed by Dr. Cleotilde, [facility].  Her only concern is that occasionally she will wake up at night with what she describes as gas pains.  She is also noted belching.  She is currently using  a nasal pillow.  She does have a chinstrap but has not used this.  She is concerned that pressure settings are too high.   Compliance report dated 11/10/2019 through 12/09/2019 revealed that she is using CPAP every night for compliance of 100%.  Every night she used CPAP greater than 4 hours for compliance of 100%.  On average she is using CPAP 7 hours and 49 minutes.  Residual AHI 0.6 on 4 to 9 cm of water.  Pressure in the 95th percentile of 8.2 with maximum of 8.8.  There is no significant leak.   HISTORY: (copied from Dr Obie note on 08/12/2019)   Dear Dr. Prentiss,    I saw your patient, Kabella Cassidy, upon your kind request to my sleep clinic today for initial consultation of her sleep disorder, in particular, concern for sleep disordered  breathing.  The patient is unaccompanied today.  As you know, Ms. Kosar is a 73 year old right-handed woman with an underlying medical history of irritable bowel syndrome, history of heart murmur, fibromyalgia, depression, anxiety, and hyperlipidemia, who reports snoring and excessive daytime somnolence.  I reviewed your office note from 07/12/2019.  She has had some morning headaches.  These are dull and achy.  She had seen Dr. Ines in our office in 2018 for headaches. Her Epworth sleepiness score is 9 out of 24, fatigue severity score is 52 out of 63.  She reports that her husband has noted some snorting sounds, these became worse when she was tried on baclofen  for neck pain.  She had a recent C-spine MRI and x-ray in July 2020.  She brought copies of the results for my review.  Her cervical spine x-ray showed multilevel degenerative disc disease.  Her MRI neck showed multilevel cervical spondylosis, most pronounced at C5-6.  She had mild canal stenosis and moderate left foraminal narrowing she was noted to have a goiter.  She is going to see a new orthopedic doctor this week. She reports that she is supposed to have a thyroid  biopsy.  She has had some depressive symptoms.  She reports side effects on antidepressant medications.  She has intermittent restless leg symptoms and takes magnesium for these symptoms as well as cramping.  She does not have nightly restless legs symptoms.  She does have nocturia once or twice per average night.  She lives with her husband.  They live with their son and his family including wife and 2 grandchildren.  She has another son in [place] .  Her son that lives here has sleep apnea and has a CPAP machine.  She reports a bedtime around 930 and rise time between 630 and 7 AM.  Years ago she tried a custom-made bite guard which broke.  She did not think it helped.  She does not believe it was made for snoring or for sleep apnea but more for grinding.  She does not drink caffeine or  alcohol.  She does not smoke.    REVIEW OF SYSTEMS: Out of a complete 14 system review of symptoms, the patient complains only of the following symptoms, occipital neuralgia and all other reviewed systems are negative.  ESS: 3/24   ALLERGIES: Allergies  Allergen Reactions   Diltiazem  Shortness Of Breath   Metoprolol  Shortness Of Breath   Statins Tinitus    Achy joints, muscle aches, nerve pain   Chocolate Other (See Comments)    migraine   Cocoa Other (See Comments)    migraine migraine   Codeine Nausea And Vomiting  Cranberry Other (See Comments)    Cystitis   Latex Rash   Monosodium Glutamate Other (See Comments)    Migraine   Penicillins Rash    Has patient had a PCN reaction causing immediate rash, facial/tongue/throat swelling, SOB or lightheadedness with hypotension: Yes Has patient had a PCN reaction causing severe rash involving mucus membranes or skin necrosis: No Has patient had a PCN reaction that required hospitalization No Has patient had a PCN reaction occurring within the last 10 years: No If all of the above answers are NO, then may proceed with Cephalosporin use.    Antihistamines, Loratadine-Type     Throat swelling   Carvedilol  Other (See Comments)    N&N, SOB , Head ache and joint pain.    Ginger    Gluten Meal    Lexapro  [Escitalopram ]     GI    Montelukast  Other (See Comments)    Throat swelling   Other Swelling    Throat swelling   Prednisone  Swelling    Throat swelling (no difficulty breathing)   Repatha  [Evolocumab ] Other (See Comments)    Worsening IBS, knee pain   Sulfites Other (See Comments)    Nausea and headaches   Vanilla    Whey    Zetia  [Ezetimibe ]    Zofran  [Ondansetron  Hcl] Other (See Comments)    Muscle cramps, leg tingling   Azithromycin Other (See Comments) and Diarrhea   Egg Protein-Containing Drug Products Other (See Comments)    States exacerbates her IBS symptoms     HOME MEDICATIONS: Outpatient Medications  Prior to Visit  Medication Sig Dispense Refill   acetaminophen  (TYLENOL ) 500 MG tablet Take 1,000 mg by mouth every 6 (six) hours as needed for headache (pain).     Cholecalciferol (VITAMIN D ) 2000 units tablet Take 2,000 Units by mouth daily.      ciclopirox  (PENLAC ) 8 % solution Apply topically at bedtime. Apply over nail and surrounding skin. Apply daily over previous coat. After seven (7) days, may remove with alcohol and continue cycle. 6.6 mL 0   co-enzyme Q-10 30 MG capsule Take 100 mg by mouth daily.     cyanocobalamin  (VITAMIN B12) 100 MCG tablet Take 100 mcg by mouth daily.     gabapentin  (NEURONTIN ) 100 MG capsule Take by mouth. (Patient taking differently: Take by mouth as needed.)     Magnesium Glycinate POWD 120 mg by Does not apply route as needed.      methimazole (TAPAZOLE) 5 MG tablet Take 0.5 tablets (2.5 mg total) by mouth daily.     omega-3 acid ethyl esters (LOVAZA) 1 g capsule Take 1,000 mg by mouth 2 (two) times daily.     Omega-3 Fatty Acids (OMEGA 3 500 PO) Take by mouth.     Polyethyl Glycol-Propyl Glycol (SYSTANE ULTRA OP) Apply to eye at bedtime.      polyethylene glycol powder (GLYCOLAX/MIRALAX) 17 GM/SCOOP powder Take 17-34 g by mouth as needed.     dicyclomine  (BENTYL ) 10 MG capsule Take 1 capsule (10 mg total) by mouth 4 (four) times daily -  before meals and at bedtime. 120 capsule 5   Multiple Vitamins-Minerals (PRESERVISION AREDS PO) Take 1 tablet by mouth daily.     No facility-administered medications prior to visit.     PAST MEDICAL HISTORY: Past Medical History:  Diagnosis Date   AC (acromioclavicular) joint bone spurs    c 5 and c6 and thoracic   Allergy    food allergies   Anxiety 04/06/2017  Benign thyroid  cyst sept 2020 dx   Cataract    bilateral   Cystitis    Depression    Dry eyes 04/06/2017   Elevated BP without diagnosis of hypertension 04/06/2017   lost weight >> BP improved   Fibromyalgia    Floaters    both eyes   Heart murmur     hx of 2 Echocardiograms in [place]  (pt was told they were normal)   Hiatal hernia    Hip pain    and groin pain for a while   History of colon polyps    History of echocardiogram    Echo 9/19: mild LVH, EF 60-65, no RWMA, Gr 1 DD, trivial MR/TR, PASP 16   History of kidney stones    History of Lyme disease    Hypercalcemia    causes kidney stones, none recent   IBS (irritable bowel syndrome)    Mixed hyperlipidemia 04/06/2017   intol to statins due to myalgias; never tried Zetia    Occipital neuralgia    Sleep apnea    mild to moderate , uses cpap   TMJ (dislocation of temporomandibular joint)    UTI (urinary tract infection) finishes antibiotic 03-27-2020   Vasomotor rhinitis      PAST SURGICAL HISTORY: Past Surgical History:  Procedure Laterality Date   APPENDECTOMY  1959   COLONOSCOPY  2016   [place]   COLONOSCOPY  2011   in [place]     EVALUATION UNDER ANESTHESIA WITH HEMORRHOIDECTOMY N/A 04/02/2020   Procedure: ANORECTAL EXAM UNDER ANESTHESIA WITH HEMORRHOIDECTOMY, HEMORRHOIDAL LIGATION;  Surgeon: Sheldon Standing, MD;  Location: [facility];  Service: General;  Laterality: N/A;  GENERAL AND LOCAL   left ovarin cyst removed  age 42's   grapefruit sized, exp laparotomy   POLYPECTOMY     UPPER GASTROINTESTINAL ENDOSCOPY     UPPER GI ENDOSCOPY       FAMILY HISTORY: Family History  Problem Relation Age of Onset   Healthy Mother    Hypertension Mother    Healthy Father    Lung cancer Father    Breast cancer Sister    Diabetes Paternal Grandmother    Colon cancer Neg Hx    Stomach cancer Neg Hx    Heart attack Neg Hx    Heart failure Neg Hx    Pancreatic cancer Neg Hx    Colon polyps Neg Hx    Esophageal cancer Neg Hx    Rectal cancer Neg Hx      SOCIAL HISTORY: Social History   Socioeconomic History   Marital status: Married    Spouse name: Not on file   Number of children: 2   Years of education: RN   Highest education level:  Not on file  Occupational History   Occupation: Retired  Tobacco Use   Smoking status: Never   Smokeless tobacco: Never  Vaping Use   Vaping status: Never Used  Substance and Sexual Activity   Alcohol use: No   Drug use: No   Sexual activity: Yes  Other Topics Concern   Not on file  Social History Narrative   ** Merged History Encounter **       Lives at home w/ her husband and family Right-handed Caffeine: none since March 2018 2 sons 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   Social Drivers of Health   Financial Resource Strain: Low Risk  (12/06/2023)   Received from [facility]   Overall Financial Resource Strain (CARDIA)  Difficulty of Paying Living Expenses: Not very hard  Food Insecurity: No Food Insecurity (12/06/2023)   Received from [facility]   Hunger Vital Sign    Within the past 12 months, you worried that your food would run out before you got the money to buy more.: Never true    Within the past 12 months, the food you bought just didn't last and you didn't have money to get more.: Never true  Transportation Needs: No Transportation Needs (12/06/2023)   Received from [facility] - Transportation    Lack of Transportation (Medical): No    Lack of Transportation (Non-Medical): No  Physical Activity: Not on file  Stress: Not on file  Social Connections: Unknown (05/09/2022)   [facility]   Social Network    Social Network: Not on file  Intimate Partner Violence: Unknown (03/31/2022)   Received from [facility]   HITS    Physically Hurt: Not on file    Insult or Talk Down To: Not on file    Threaten Physical Harm: Not on file    Scream or Curse: Not on file      PHYSICAL EXAM  Vitals:   11/06/24 1315  BP: 128/75  Pulse: 73  Weight: 149 lb (67.6 kg)  Height: 5' 2 (1.575 m)       Body mass index is 27.25 kg/m.   Generalized: Well developed, in no acute distress  Cardiology: normal rate and  rhythm, no murmur auscultated  Respiratory: clear to auscultation bilaterally    Neurological examination  Mentation: Alert oriented to time, place, history taking. Follows all commands speech and language fluent Cranial nerve II-XII: Pupils were equal round reactive to light. Extraocular movements were full, visual field were full on confrontational test. Facial sensation and strength were normal. Head turning and shoulder shrug  were normal and symmetric. Motor: The motor testing reveals 5 over 5 strength of all 4 extremities. Good symmetric motor tone is noted throughout.  Gait and station: Gait is normal.     DIAGNOSTIC DATA (LABS, IMAGING, TESTING) - I reviewed patient records, labs, notes, testing and imaging myself where available.  Lab Results  Component Value Date   WBC 8.8 02/13/2023   HGB 13.7 02/13/2023   HCT 40.6 02/13/2023   MCV 94.3 02/13/2023   PLT 363.0 02/13/2023      Component Value Date/Time   NA 140 02/13/2023 0839   K 3.9 02/13/2023 0839   CL 103 02/13/2023 0839   CO2 28 02/13/2023 0839   GLUCOSE 95 02/13/2023 0839   BUN 16 02/13/2023 0839   CREATININE 0.83 02/13/2023 0839   CREATININE 0.70 10/28/2020 0927   CALCIUM  9.9 02/13/2023 0839   PROT 7.2 02/13/2023 0839   PROT 7.1 07/10/2017 1058   ALBUMIN 4.4 02/13/2023 0839   AST 17 02/13/2023 0839   ALT 13 02/13/2023 0839   ALKPHOS 116 02/13/2023 0839   BILITOT 0.5 02/13/2023 0839   GFRNONAA >60 08/28/2019 1846   GFRAA >60 08/28/2019 1846   Lab Results  Component Value Date   CHOL 203 (H) 06/14/2024   HDL 53 06/14/2024   LDLCALC 130 (H) 06/14/2024   TRIG 114 06/14/2024   CHOLHDL 3.8 06/14/2024   Lab Results  Component Value Date   HGBA1C 5.4 10/29/2021   Lab Results  Component Value Date   VITAMINB12 301 02/13/2023   Lab Results  Component Value Date   TSH 0.52 10/30/2024  No data to display           ASSESSMENT AND PLAN  73 y.o. year old female  has a past medical  history of AC (acromioclavicular) joint bone spurs, Allergy, Anxiety (04/06/2017), Benign thyroid  cyst (sept 2020 dx), Cataract, Cystitis, Depression, Dry eyes (04/06/2017), Elevated BP without diagnosis of hypertension (04/06/2017), Fibromyalgia, Floaters, Heart murmur, Hiatal hernia, Hip pain, History of colon polyps, History of echocardiogram, History of kidney stones, History of Lyme disease, Hypercalcemia, IBS (irritable bowel syndrome), Mixed hyperlipidemia (04/06/2017), Occipital neuralgia, Sleep apnea, TMJ (dislocation of temporomandibular joint), UTI (urinary tract infection) (finishes antibiotic 03-27-2020), and Vasomotor rhinitis. here with    OSA on CPAP  Koraline is doing very well from a sleep apnea standpoint.  Compliance report reveals excellent compliance.  She was encouraged to continue using CPAP nightly and for greater than 4 hours each night. Supply orders updated. She will continue close follow up with [facility]. We will see her back in 1 year, sooner if needed.  She verbalizes understanding and agreement with this plan.    No orders of the defined types were placed in this encounter.     Greig Forbes, MSN, FNP-C 11/06/2024, 3:34 PM  [facility] [address] [place], [place] [address] [phone]

## 2024-10-31 NOTE — Patient Instructions (Signed)
Please continue using your CPAP regularly. While your insurance requires that you use CPAP at least 4 hours each night on 70% of the nights, I recommend, that you not skip any nights and use it throughout the night if you can. Getting used to CPAP and staying with the treatment long term does take time and patience and discipline. Untreated obstructive sleep apnea when it is moderate to severe can have an adverse impact on cardiovascular health and raise her risk for heart disease, arrhythmias, hypertension, congestive heart failure, stroke and diabetes. Untreated obstructive sleep apnea causes sleep disruption, nonrestorative sleep, and sleep deprivation. This can have an impact on your day to day functioning and cause daytime sleepiness and impairment of cognitive function, memory loss, mood disturbance, and problems focussing. Using CPAP regularly can improve these symptoms. ? ? ?Follow up in 1 year  ?

## 2024-11-05 ENCOUNTER — Ambulatory Visit: Admitting: Family Medicine

## 2024-11-05 NOTE — Progress Notes (Unsigned)
 Natalie Erickson

## 2024-11-06 ENCOUNTER — Ambulatory Visit (INDEPENDENT_AMBULATORY_CARE_PROVIDER_SITE_OTHER): Admitting: Family Medicine

## 2024-11-06 ENCOUNTER — Encounter: Payer: Self-pay | Admitting: Family Medicine

## 2024-11-06 VITALS — BP 128/75 | HR 73 | Ht 62.0 in | Wt 149.0 lb

## 2024-11-06 DIAGNOSIS — G4733 Obstructive sleep apnea (adult) (pediatric): Secondary | ICD-10-CM | POA: Diagnosis not present

## 2024-12-02 NOTE — Progress Notes (Unsigned)
 Cardiology Office Note   Date:  12/02/2024  ID:  Natalie Erickson, DOB [birth year], MRN [id number] PCP: Kennyth Worth HERO, MD   HeartCare Providers Cardiologist:  Soyla DELENA Merck, MD     PMH Palpitations Hyperlipidemia Fibromyalgia IBS Depression OSA on CPAP Elevated lipoprotein a Aortic atherosclerosis Statin intolerance Murmur  She established with heart care for evaluation of palpitations and DOE.  Echo 2019 revealed EF 60 to 65%, G1 DD, no significant valvular abnormalities.  48-hour Holter monitor revealed occasional PVCs/PACs which corresponded with symptoms.  She was prescribed propranolol  as needed for symptomatic palpitations.  Referred to advanced lipid disorder clinic and seen by Dr. Mona 05/09/2022.  History of statin intolerance.  She had been trying to manage cholesterol with diet and increased activity and had reduction in LDL cholesterol from 187-150.  Prior CT of abdomen and pelvis showed aortic atherosclerosis.  Some heart disease remotely in her family.  She is a retired engineer, civil (consulting) and has concerns about statins as far as potential side effects.  Recommendation to undergo CT calcium  scoring for further risk stratification.  She wanted to try red yeast rice for lipid-lowering therapy but it also caused significant myalgias.  NMR 09/02/2022 revealed LDL particle #1776, LDL-C 845, small LDL-P 448. LP(a) significant elevated at 281.4 nmol/L. Her sister also has elevated LP(a) and is on Repatha .  She started Repatha  for 8 weeks but developed worsening IBS symptoms and knee pain.  Last clinic visit was 02/20/2024 with Dr. Mona. Unable to tolerate rosuvastatin  5 mg daily and continued to have elevated LDL particle numbers.  Livalo  was trialed and unfortunately she did not tolerate. She was attempting to take ezetimibe  but only once or twice a week.  She was having difficulty exercising because of piriformis syndrome. Did not want to try Leqvio at that time, was going to try to  increase intake of ezetimibe .  Seen in follow-up by  me on 06/19/24. She continued to have pain which limits physical activity. Despite family history of maternal GF who had heart attacks, her mother high cholesterol and lived to be 85 years old with no significant cardiac event or stroke. Was advised by functional medicine provider to take ashwagandha. History of murmur with echo in 2019 that showed normal heart function and trivial valve disease. No concerning cardiac symptoms. She was off ezetimibe  due to recent GI illness.  Willing to restart ezetimibe  at 5 mg and aim to increase to 10 mg 3 days per week.   She called 08/01/24 to report diarrhea and cramps and discontinued ezetimibe .   History of Present Illness Discussed the use of AI scribe software for clinical note transcription with the patient, who gave verbal consent to proceed.  History of Present Illness   ROS: + Chronic pain  Studies Reviewed       Lipoprotein (a)  Date/Time Value Ref Range Status  02/13/2023 08:39 AM 308 (H) <75 nmol/L Final    Comment:    Verified by repeat analysis.  Risk: Optimal <75 nmol/L; Moderate 75-125 nmol/L; High >125 nmol/L. Cardiovascular event risk category cut points (optimal, moderate, high) are based on Tsimika S. JACC 2017;69:692-711.    01/02/2023 08:22 AM 183.9 (H) <75.0 nmol/L Final    Comment:    **Results verified by repeat testing** Note:  Values greater than or equal to 75.0 nmol/L may        indicate an independent risk factor for CHD,        but must be evaluated with  caution when applied        to non-Caucasian populations due to the        influence of genetic factors on Lp(a) across        ethnicities.     Risk Assessment/Calculations   No BP recorded.  {Refresh Note OR Click here to enter BP  :1}***       Physical Exam VS:  There were no vitals taken for this visit.   Wt Readings from Last 3 Encounters:  11/06/24 149 lb (67.6 kg)  10/30/24 149 lb (67.6 kg)   08/09/24 152 lb (68.9 kg)    GEN: Well nourished, well developed in no acute distress NECK: No JVD; No carotid bruits CARDIAC: RRR, soft systolic murmur. No rubs, gallops RESPIRATORY:  Clear to auscultation without rales, wheezing or rhonchi  ABDOMEN: Soft, non-tender, non-distended EXTREMITIES:  No edema; No deformity   Assessment & Plan Hyperlipidemia LDL goal  Lipid panel completed 06/14/2024 with total cholesterol 203, triglycerides 114, HDL 53, and LDL-C 130.  Slight improvement in LDL from 156 on 02/14/2024.  She attributes this improvement to weight loss and dietary improvements.  Exercise has been read due to muscle and joint problems, but she is consistently doing Tai Chi, light yoga and walking her dog. She has been intolerant to multiple statins and PCSK9i. LDL remains elevated despite lifestyle changes. She does not want to try Nexletol due to concerns about tendon injury. She is concerned about side effects of Leqvio given that it is effective for 6 months. She has been off ezetimibe  for a few weeks due to GI illness. We will have her restart Zetia  at 5 mg and aim increase to a full 10 mg three days a week. Advised her to let me know if she does not tolerate. Continue to focus on heart healthy mostly plant based diet avoiding saturated fat, processed foods, simple carbohydrates, and sugar along with aiming for at least 150 minutes of moderate intensity exercise each week.   Aortic atherosclerosis/Elevated LP(a) Aortic atherosclerosis noted on prior CT. LP(a) initially elevated at 281.4 on 09/02/2022 with improvement to 183.9 on 01/02/2023 while on PCSK9i which unfortunately she did not tolerate. EKG today reveals NSR with no acute abnormality. She denies chest pain, dyspnea, or other symptoms concerning for angina.  No indication for further ischemic evaluation at this time.   Fibromyalgia   Chronic fibromyalgia causes severe pain impacting her quality of life. Statins exacerbate symptoms.  She prioritizes quality of life over longevity. Ashwagandha has been recommended by functional medicine provider for anti-inflammatory benefits. Advised no known cardiac concerns with ashwagandha. Continue low impact exercise and sleep hygiene.   Heart murmur   History of heart murmur. Echo 08/2018 revealed normal heart function and trivial TR and MR. She is asymptomatic. We will continue to monitor clinically for now.         Dispo: ***  Signed, Rosaline Bane, NP-C

## 2024-12-05 LAB — NMR, LIPOPROFILE
Cholesterol, Total: 251 mg/dL — ABNORMAL HIGH (ref 100–199)
HDL Particle Number: 34.9 umol/L (ref >=30.5)
HDL-C: 66 mg/dL (ref >39)
LDL Particle Number: 1638 nmol/L — ABNORMAL HIGH (ref <1000)
LDL Size: 21.4 nm (ref >20.5)
LDL-C (NIH Calc): 167 mg/dL — ABNORMAL HIGH (ref 0–99)
LP-IR Score: <25 (ref <=45)
Small LDL Particle Number: 487 nmol/L (ref <=527)
Triglycerides: 103 mg/dL (ref 0–149)

## 2024-12-05 LAB — ALT: ALT: 12 IU/L (ref 0–32)

## 2024-12-06 ENCOUNTER — Ambulatory Visit (HOSPITAL_BASED_OUTPATIENT_CLINIC_OR_DEPARTMENT_OTHER): Payer: Self-pay | Admitting: Nurse Practitioner

## 2024-12-09 ENCOUNTER — Encounter (HOSPITAL_BASED_OUTPATIENT_CLINIC_OR_DEPARTMENT_OTHER): Payer: Self-pay | Admitting: Nurse Practitioner

## 2024-12-09 ENCOUNTER — Ambulatory Visit (INDEPENDENT_AMBULATORY_CARE_PROVIDER_SITE_OTHER): Admitting: Nurse Practitioner

## 2024-12-09 VITALS — BP 120/64 | HR 63 | Ht 62.0 in | Wt 148.9 lb

## 2024-12-09 DIAGNOSIS — M797 Fibromyalgia: Secondary | ICD-10-CM | POA: Diagnosis not present

## 2024-12-09 DIAGNOSIS — R198 Other specified symptoms and signs involving the digestive system and abdomen: Secondary | ICD-10-CM

## 2024-12-09 DIAGNOSIS — E785 Hyperlipidemia, unspecified: Secondary | ICD-10-CM

## 2024-12-09 DIAGNOSIS — I700 Atherosclerosis of aorta: Secondary | ICD-10-CM

## 2024-12-09 DIAGNOSIS — R011 Cardiac murmur, unspecified: Secondary | ICD-10-CM | POA: Diagnosis not present

## 2024-12-09 DIAGNOSIS — Z7189 Other specified counseling: Secondary | ICD-10-CM | POA: Diagnosis not present

## 2024-12-09 DIAGNOSIS — T466X5D Adverse effect of antihyperlipidemic and antiarteriosclerotic drugs, subsequent encounter: Secondary | ICD-10-CM

## 2024-12-09 DIAGNOSIS — T466X5A Adverse effect of antihyperlipidemic and antiarteriosclerotic drugs, initial encounter: Secondary | ICD-10-CM

## 2024-12-09 DIAGNOSIS — E7841 Elevated Lipoprotein(a): Secondary | ICD-10-CM | POA: Diagnosis not present

## 2024-12-09 MED ORDER — ICOSAPENT ETHYL 1 G PO CAPS: 2.0000 g | ORAL_CAPSULE | Freq: Two times a day (BID) | ORAL | 3 refills | Status: AC

## 2024-12-09 NOTE — Patient Instructions (Signed)
 Medication Instructions:  Your physician recommends that you continue on your current medications as directed. Please refer to the Current Medication list given to you today.   *If you need a refill on your cardiac medications before your next appointment, please call your pharmacy*  Lab Work: NONE   Testing/Procedures: NONE  Follow-Up: At [facility], you and your health needs are our priority.  As part of our continuing mission to provide you with exceptional heart care, our providers are all part of one team.  This team includes your primary Cardiologist (physician) and Advanced Practice Providers or APPs (Physician Assistants and Nurse Practitioners) who all work together to provide you with the care you need, when you need it.  Your next appointment:   6 month(s)  Provider:   Rosaline Bane, NP    We recommend signing up for the patient portal called MyChart.  Sign up information is provided on this After Visit Summary.  MyChart is used to connect with patients for Virtual Visits (Telemedicine).  Patients are able to view lab/test results, encounter notes, upcoming appointments, etc.  Non-urgent messages can be sent to your provider as well.   To learn more about what you can do with MyChart, go to [email or web address].

## 2024-12-23 ENCOUNTER — Telehealth: Payer: Self-pay | Admitting: Family Medicine

## 2024-12-23 DIAGNOSIS — G4733 Obstructive sleep apnea (adult) (pediatric): Secondary | ICD-10-CM

## 2024-12-23 NOTE — Telephone Encounter (Signed)
 SABRA

## 2024-12-23 NOTE — Telephone Encounter (Signed)
 Pt called stated she may need her pressure on CPAP adjusted due to her ear ringing and the pressure in her head the patient stated she is waking up  uncomfortable almost every day . Pt wanted to know if the pressure on machine ca be adjusted

## 2024-12-30 NOTE — Telephone Encounter (Signed)
 CPAP ORDER SENT TO [facility]

## 2024-12-31 ENCOUNTER — Other Ambulatory Visit (HOSPITAL_COMMUNITY): Payer: Self-pay

## 2024-12-31 ENCOUNTER — Telehealth: Payer: Self-pay | Admitting: Pharmacy Technician

## 2024-12-31 ENCOUNTER — Telehealth: Payer: Self-pay | Admitting: Nurse Practitioner

## 2024-12-31 NOTE — Telephone Encounter (Signed)
 Pt c/o medication issue:  1. Name of Medication:   icosapent  Ethyl (VASCEPA ) 1 g capsule    2. How are you currently taking this medication (dosage and times per day)? No taking  3. Are you having a reaction (difficulty breathing--STAT)? no  4. What is your medication issue? Pt said the script was going to cost her $500 and she could not afford that. Please advise

## 2024-12-31 NOTE — Telephone Encounter (Signed)
 Patient Advocate Encounter   The patient was approved for a [employer] grant that will help cover the cost of vascepa  Total amount awarded, 2500.00.  Effective: 12/01/24 - 11/30/25   APW:[id number] ERW:[id number] Hmnle:[id number] PI:[id number]  [employer] ID: [id number]   Pharmacy provided with approval and processing information. Patient informed via mychart

## 2025-02-25 ENCOUNTER — Ambulatory Visit: Admitting: Internal Medicine

## 2025-12-01 ENCOUNTER — Ambulatory Visit: Admitting: Family Medicine
